# Patient Record
Sex: Female | Born: 1960 | Race: Black or African American | Hispanic: No | Marital: Single | Smoking: Former smoker
Health system: Southern US, Community
[De-identification: ages and names within clinical notes are randomized; demographics above are authoritative.]

## PROBLEM LIST (undated history)

## (undated) DIAGNOSIS — G4730 Sleep apnea, unspecified: Secondary | ICD-10-CM

## (undated) DIAGNOSIS — Z9981 Dependence on supplemental oxygen: Secondary | ICD-10-CM

## (undated) DIAGNOSIS — I872 Venous insufficiency (chronic) (peripheral): Secondary | ICD-10-CM

## (undated) DIAGNOSIS — I890 Lymphedema, not elsewhere classified: Secondary | ICD-10-CM

## (undated) DIAGNOSIS — R0600 Dyspnea, unspecified: Secondary | ICD-10-CM

## (undated) DIAGNOSIS — R6520 Severe sepsis without septic shock: Secondary | ICD-10-CM

## (undated) DIAGNOSIS — I10 Essential (primary) hypertension: Secondary | ICD-10-CM

## (undated) DIAGNOSIS — N186 End stage renal disease: Secondary | ICD-10-CM

## (undated) DIAGNOSIS — E785 Hyperlipidemia, unspecified: Secondary | ICD-10-CM

## (undated) DIAGNOSIS — E041 Nontoxic single thyroid nodule: Secondary | ICD-10-CM

## (undated) DIAGNOSIS — J9601 Acute respiratory failure with hypoxia: Secondary | ICD-10-CM

## (undated) DIAGNOSIS — M1990 Unspecified osteoarthritis, unspecified site: Secondary | ICD-10-CM

## (undated) DIAGNOSIS — J449 Chronic obstructive pulmonary disease, unspecified: Secondary | ICD-10-CM

## (undated) DIAGNOSIS — D649 Anemia, unspecified: Secondary | ICD-10-CM

## (undated) DIAGNOSIS — E0590 Thyrotoxicosis, unspecified without thyrotoxic crisis or storm: Secondary | ICD-10-CM

## (undated) DIAGNOSIS — E079 Disorder of thyroid, unspecified: Secondary | ICD-10-CM

## (undated) DIAGNOSIS — I639 Cerebral infarction, unspecified: Secondary | ICD-10-CM

## (undated) DIAGNOSIS — J45909 Unspecified asthma, uncomplicated: Secondary | ICD-10-CM

## (undated) DIAGNOSIS — J9611 Chronic respiratory failure with hypoxia: Secondary | ICD-10-CM

## (undated) DIAGNOSIS — G4733 Obstructive sleep apnea (adult) (pediatric): Secondary | ICD-10-CM

## (undated) DIAGNOSIS — E119 Type 2 diabetes mellitus without complications: Secondary | ICD-10-CM

## (undated) DIAGNOSIS — I509 Heart failure, unspecified: Secondary | ICD-10-CM

## (undated) HISTORY — DX: Essential (primary) hypertension: I10

## (undated) HISTORY — DX: Cerebral infarction, unspecified: I63.9

## (undated) HISTORY — DX: Acute respiratory failure with hypoxia: R65.20

## (undated) HISTORY — DX: Disorder of thyroid, unspecified: E07.9

## (undated) HISTORY — DX: Nontoxic single thyroid nodule: E04.1

## (undated) HISTORY — DX: Chronic obstructive pulmonary disease, unspecified: J44.9

## (undated) HISTORY — DX: Heart failure, unspecified: I50.9

## (undated) HISTORY — DX: Hyperlipidemia, unspecified: E78.5

## (undated) HISTORY — DX: Type 2 diabetes mellitus without complications: E11.9

## (undated) HISTORY — DX: Unspecified asthma, uncomplicated: J45.909

## (undated) HISTORY — PX: OTHER SURGICAL HISTORY: SHX169

---

## 1998-06-20 ENCOUNTER — Ambulatory Visit (HOSPITAL_BASED_OUTPATIENT_CLINIC_OR_DEPARTMENT_OTHER): Admission: RE | Admit: 1998-06-20 | Discharge: 1998-06-20 | Payer: Self-pay | Admitting: Orthopedic Surgery

## 1998-09-12 ENCOUNTER — Ambulatory Visit (HOSPITAL_BASED_OUTPATIENT_CLINIC_OR_DEPARTMENT_OTHER): Admission: RE | Admit: 1998-09-12 | Discharge: 1998-09-12 | Payer: Self-pay | Admitting: Orthopedic Surgery

## 2004-06-26 ENCOUNTER — Ambulatory Visit: Payer: Self-pay | Admitting: Family Medicine

## 2004-10-29 ENCOUNTER — Emergency Department: Payer: Self-pay | Admitting: Emergency Medicine

## 2006-07-14 ENCOUNTER — Ambulatory Visit: Payer: Self-pay | Admitting: *Deleted

## 2007-07-15 ENCOUNTER — Ambulatory Visit: Payer: Self-pay | Admitting: *Deleted

## 2009-05-25 ENCOUNTER — Emergency Department: Payer: Self-pay | Admitting: Emergency Medicine

## 2012-06-13 ENCOUNTER — Emergency Department: Payer: Self-pay | Admitting: Emergency Medicine

## 2012-06-21 ENCOUNTER — Ambulatory Visit: Payer: Self-pay | Admitting: Emergency Medicine

## 2012-10-16 ENCOUNTER — Emergency Department: Payer: Self-pay | Admitting: Emergency Medicine

## 2012-10-16 LAB — COMPREHENSIVE METABOLIC PANEL
Albumin: 3.2 g/dL — ABNORMAL LOW (ref 3.4–5.0)
Alkaline Phosphatase: 92 U/L (ref 50–136)
Bilirubin,Total: 0.4 mg/dL (ref 0.2–1.0)
Calcium, Total: 9.2 mg/dL (ref 8.5–10.1)
Chloride: 108 mmol/L — ABNORMAL HIGH (ref 98–107)
Co2: 26 mmol/L (ref 21–32)
EGFR (African American): >60
EGFR (Non-African Amer.): 54 — ABNORMAL LOW
Glucose: 60 mg/dL — ABNORMAL LOW (ref 65–99)
SGOT(AST): 82 U/L — ABNORMAL HIGH (ref 15–37)
SGPT (ALT): 136 U/L — ABNORMAL HIGH (ref 12–78)
Total Protein: 7.4 g/dL (ref 6.4–8.2)

## 2012-10-16 LAB — URINALYSIS, COMPLETE
Bilirubin,UR: NEGATIVE
Glucose,UR: NEGATIVE mg/dL (ref 0–75)
Leukocyte Esterase: NEGATIVE
Nitrite: NEGATIVE
Protein: 30
Specific Gravity: 1.016 (ref 1.003–1.030)
Squamous Epithelial: 6

## 2012-10-16 LAB — CBC
HCT: 43.1 % (ref 35.0–47.0)
MCHC: 33.6 g/dL (ref 32.0–36.0)
MCV: 85 fL (ref 80–100)
Platelet: 185 10*3/uL (ref 150–440)
RBC: 5.05 10*6/uL (ref 3.80–5.20)
WBC: 6.2 10*3/uL (ref 3.6–11.0)

## 2013-01-05 DIAGNOSIS — Z8614 Personal history of Methicillin resistant Staphylococcus aureus infection: Secondary | ICD-10-CM

## 2013-01-05 HISTORY — DX: Personal history of Methicillin resistant Staphylococcus aureus infection: Z86.14

## 2013-04-22 ENCOUNTER — Emergency Department: Payer: Self-pay | Admitting: Emergency Medicine

## 2013-04-22 LAB — CBC
HCT: 43 % (ref 35.0–47.0)
HGB: 13.9 g/dL (ref 12.0–16.0)
MCH: 27.5 pg (ref 26.0–34.0)
MCHC: 32.3 g/dL (ref 32.0–36.0)
MCV: 85 fL (ref 80–100)
PLATELETS: 211 10*3/uL (ref 150–440)
RBC: 5.06 10*6/uL (ref 3.80–5.20)
RDW: 13.4 % (ref 11.5–14.5)
WBC: 9.8 10*3/uL (ref 3.6–11.0)

## 2013-04-22 LAB — BASIC METABOLIC PANEL
ANION GAP: 8 (ref 7–16)
BUN: 33 mg/dL — ABNORMAL HIGH (ref 7–18)
Calcium, Total: 9.8 mg/dL (ref 8.5–10.1)
Chloride: 102 mmol/L (ref 98–107)
Co2: 28 mmol/L (ref 21–32)
Creatinine: 1.39 mg/dL — ABNORMAL HIGH (ref 0.60–1.30)
EGFR (African American): 50 — ABNORMAL LOW
EGFR (Non-African Amer.): 43 — ABNORMAL LOW
Glucose: 201 mg/dL — ABNORMAL HIGH (ref 65–99)
OSMOLALITY: 289 (ref 275–301)
Potassium: 3.2 mmol/L — ABNORMAL LOW (ref 3.5–5.1)
Sodium: 138 mmol/L (ref 136–145)

## 2013-04-23 ENCOUNTER — Emergency Department: Payer: Self-pay | Admitting: Emergency Medicine

## 2013-04-23 LAB — CBC WITH DIFFERENTIAL/PLATELET
BASOS ABS: 0.1 10*3/uL (ref 0.0–0.1)
BASOS PCT: 0.8 %
EOS PCT: 1.3 %
Eosinophil #: 0.1 10*3/uL (ref 0.0–0.7)
HCT: 43.3 % (ref 35.0–47.0)
HGB: 13.7 g/dL (ref 12.0–16.0)
LYMPHS PCT: 34.7 %
Lymphocyte #: 3 10*3/uL (ref 1.0–3.6)
MCH: 27 pg (ref 26.0–34.0)
MCHC: 31.6 g/dL — AB (ref 32.0–36.0)
MCV: 86 fL (ref 80–100)
Monocyte #: 0.7 x10 3/mm (ref 0.2–0.9)
Monocyte %: 8.1 %
NEUTROS PCT: 55.1 %
Neutrophil #: 4.7 10*3/uL (ref 1.4–6.5)
PLATELETS: 241 10*3/uL (ref 150–440)
RBC: 5.05 10*6/uL (ref 3.80–5.20)
RDW: 13.4 % (ref 11.5–14.5)
WBC: 8.6 10*3/uL (ref 3.6–11.0)

## 2013-04-23 LAB — COMPREHENSIVE METABOLIC PANEL
ALBUMIN: 2.3 g/dL — AB (ref 3.4–5.0)
ALK PHOS: 103 U/L
Anion Gap: 8 (ref 7–16)
BUN: 40 mg/dL — ABNORMAL HIGH (ref 7–18)
Bilirubin,Total: 0.3 mg/dL (ref 0.2–1.0)
CHLORIDE: 102 mmol/L (ref 98–107)
CO2: 28 mmol/L (ref 21–32)
CREATININE: 1.63 mg/dL — AB (ref 0.60–1.30)
Calcium, Total: 9.9 mg/dL (ref 8.5–10.1)
EGFR (African American): 41 — ABNORMAL LOW
GFR CALC NON AF AMER: 35 — AB
GLUCOSE: 243 mg/dL — AB (ref 65–99)
Osmolality: 293 (ref 275–301)
POTASSIUM: 3 mmol/L — AB (ref 3.5–5.1)
SGOT(AST): 24 U/L (ref 15–37)
SGPT (ALT): 16 U/L (ref 12–78)
Sodium: 138 mmol/L (ref 136–145)
Total Protein: 8 g/dL (ref 6.4–8.2)

## 2013-04-23 LAB — SEDIMENTATION RATE: Erythrocyte Sed Rate: 17 mm/hr (ref 0–30)

## 2013-06-02 ENCOUNTER — Emergency Department: Payer: Self-pay | Admitting: Emergency Medicine

## 2013-06-02 LAB — COMPREHENSIVE METABOLIC PANEL
ALT: 27 U/L (ref 12–78)
ANION GAP: 7 (ref 7–16)
Albumin: 3 g/dL — ABNORMAL LOW (ref 3.4–5.0)
Alkaline Phosphatase: 127 U/L — ABNORMAL HIGH
BUN: 14 mg/dL (ref 7–18)
Bilirubin,Total: 0.3 mg/dL (ref 0.2–1.0)
CHLORIDE: 107 mmol/L (ref 98–107)
CO2: 26 mmol/L (ref 21–32)
Calcium, Total: 9.5 mg/dL (ref 8.5–10.1)
Creatinine: 0.65 mg/dL (ref 0.60–1.30)
EGFR (African American): >60
GLUCOSE: 178 mg/dL — AB (ref 65–99)
OSMOLALITY: 284 (ref 275–301)
POTASSIUM: 3.5 mmol/L (ref 3.5–5.1)
SGOT(AST): 21 U/L (ref 15–37)
Sodium: 140 mmol/L (ref 136–145)
Total Protein: 7.7 g/dL (ref 6.4–8.2)

## 2013-06-02 LAB — CBC WITH DIFFERENTIAL/PLATELET
COMMENT - H1-COM2: NORMAL
Comment - H1-Com1: NORMAL
Eosinophil: 7 %
HCT: 41.7 % (ref 35.0–47.0)
HGB: 13.5 g/dL (ref 12.0–16.0)
Lymphocytes: 52 %
MCH: 27.7 pg (ref 26.0–34.0)
MCHC: 32.5 g/dL (ref 32.0–36.0)
MCV: 85 fL (ref 80–100)
MONOS PCT: 11 %
Platelet: 158 10*3/uL (ref 150–440)
RBC: 4.89 10*6/uL (ref 3.80–5.20)
RDW: 13.1 % (ref 11.5–14.5)
SEGMENTED NEUTROPHILS: 20 %
VARIANT LYMPHOCYTE - H1-RLYMPH: 10 %
WBC: 4.3 10*3/uL (ref 3.6–11.0)

## 2013-06-07 LAB — CULTURE, BLOOD (SINGLE)

## 2014-04-28 NOTE — Consult Note (Signed)
PATIENT NAME:  Emily [name], Emily [name] MR#:  [id number] DATE OF BIRTH:  1960-11-09    CONSULTING PHYSICIAN:  Mardene Sayer, MD  CHIEF COMPLAINT: Left hand pain.  HISTORY OF PRESENT ILLNESS: The patient is a 54 year old diabetic, who presents with left hand pain. Of note, she was cleaning fish over a week ago when she suffered a puncture wound to her left thumb. This was on the end of the thumb, on the tip. Since then, she has had increasing progressive swelling of her hand and thumb, with pain. She was previously seen at an urgent care facility, where she received a dose of Keflex. She has been worsening with regards to the left hand pain.   The patient states that she currently does not work. She is unemployed. She lives locally.   PAST MEDICAL HISTORY:  Positive for diabetes. For the remainder of the medical history, please see the electronic medical record.  MEDICATIONS:  See EMR.  ALLERGIES:  See EMR.  SOCIAL HISTORY:  The patient is here with her mother. She does not smoke.   PHYSICAL EXAMINATION: GENERAL:  Vital signs are stable.  HEENT:  Head is atraumatic and normocephalic. Normal symmetric chest rise. No increased work of breathing.  EXTREMITIES: The left upper extremity is held in the semi-flexed position. All the joints of the hand are held in the semi-flexed position. There is diffuse swelling over the dorsum of the hand as well as the volar hand extending out into the fingertips. There is a healing puncture wound over the tip of the thumb. The patient has pain with extension of any of the fingers, but worse in the thumb and small finger. The pain is so much so that she startles and jumps up off the bed when the fingers are moved.  LABS:  Lab tests demonstrate a serum glucose of 243, white count is 8.6, platelets are 241. The remainder of the labs are currently not available.  ASSESSMENT: A 54 year old female with a left hand infection, concerning for a deep palmar space infection.    DISCUSSION:  In this patient with history of a puncture wound of the thumb, and diabetes, and diffuse swelling of the hand, and pain with passive extension of all the fingers, my top concern is an extensive tenosynovitis infection in the hand and palm. I think this may have started as a flexor tenosynovitis of the thumb, which is extended proximally, extending into the palmar space and causing subsequent irritation or infection of the flexor tendons of the index, long, and ring.  I have spoken on the phone with the Attending plastic hand surgeon on call at [facility], who has refused transfer of the patient. We subsequently talked to the emergency department at [facility], who has agreed to have the patient transferred for evaluation. We will keep her n.p.o. and give her fluids in anticipation of her transfer.    ____________________________ Mardene Sayer, MD pa:mr D: 04/23/2013 14:50:26 ET T: 04/23/2013 17:23:47 ET JOB#: [id number]  cc: Mardene Sayer, MD, <Dictator> Raelyn Ensign. Stephanie Acre, MD PhD Orthopaedic Surgery ELECTRONICALLY SIGNED 04/24/2013 22:33

## 2014-05-24 ENCOUNTER — Ambulatory Visit: Payer: Self-pay

## 2014-05-31 ENCOUNTER — Ambulatory Visit: Payer: Self-pay

## 2014-06-07 ENCOUNTER — Ambulatory Visit: Payer: Self-pay

## 2014-07-12 ENCOUNTER — Other Ambulatory Visit: Payer: Self-pay

## 2014-07-19 ENCOUNTER — Ambulatory Visit: Payer: Self-pay

## 2014-07-24 ENCOUNTER — Institutional Professional Consult (permissible substitution): Payer: Self-pay

## 2014-08-16 ENCOUNTER — Other Ambulatory Visit: Payer: Self-pay

## 2014-08-21 ENCOUNTER — Institutional Professional Consult (permissible substitution): Payer: Self-pay

## 2014-08-23 ENCOUNTER — Ambulatory Visit: Payer: Self-pay

## 2014-11-13 ENCOUNTER — Other Ambulatory Visit: Payer: Self-pay

## 2014-11-20 ENCOUNTER — Ambulatory Visit: Payer: Self-pay

## 2014-11-22 ENCOUNTER — Ambulatory Visit: Payer: Self-pay

## 2014-12-20 ENCOUNTER — Other Ambulatory Visit: Payer: Self-pay

## 2014-12-27 ENCOUNTER — Ambulatory Visit: Payer: Self-pay

## 2014-12-27 DIAGNOSIS — N189 Chronic kidney disease, unspecified: Secondary | ICD-10-CM | POA: Insufficient documentation

## 2014-12-27 DIAGNOSIS — I10 Essential (primary) hypertension: Secondary | ICD-10-CM | POA: Insufficient documentation

## 2014-12-27 DIAGNOSIS — Z720 Tobacco use: Secondary | ICD-10-CM | POA: Insufficient documentation

## 2015-01-09 ENCOUNTER — Other Ambulatory Visit: Payer: Self-pay | Admitting: Urology

## 2015-01-09 DIAGNOSIS — N183 Chronic kidney disease, stage 3 unspecified: Secondary | ICD-10-CM

## 2015-01-09 DIAGNOSIS — R944 Abnormal results of kidney function studies: Secondary | ICD-10-CM

## 2015-01-17 ENCOUNTER — Ambulatory Visit
Admission: RE | Admit: 2015-01-17 | Discharge: 2015-01-17 | Disposition: A | Discharge status: Home / Self Care | Payer: Self-pay | Source: Ambulatory Visit | Attending: Urology | Admitting: Urology

## 2015-01-17 DIAGNOSIS — N183 Chronic kidney disease, stage 3 unspecified: Secondary | ICD-10-CM

## 2015-01-17 DIAGNOSIS — R944 Abnormal results of kidney function studies: Secondary | ICD-10-CM | POA: Insufficient documentation

## 2015-01-22 ENCOUNTER — Ambulatory Visit: Payer: Self-pay

## 2015-01-31 ENCOUNTER — Other Ambulatory Visit: Payer: Self-pay

## 2015-01-31 LAB — BASIC METABOLIC PANEL
BUN: 14 mg/dL (ref 4–21)
Glucose: 201 mg/dL
POTASSIUM: 4 mmol/L (ref 3.4–5.3)
SODIUM: 144 mmol/L (ref 137–147)

## 2015-01-31 LAB — HEMOGLOBIN A1C: Hemoglobin A1C: 11.4

## 2015-02-07 ENCOUNTER — Ambulatory Visit: Payer: Self-pay

## 2015-02-07 DIAGNOSIS — E1159 Type 2 diabetes mellitus with other circulatory complications: Secondary | ICD-10-CM | POA: Insufficient documentation

## 2015-02-07 DIAGNOSIS — E1165 Type 2 diabetes mellitus with hyperglycemia: Secondary | ICD-10-CM | POA: Insufficient documentation

## 2015-02-26 ENCOUNTER — Ambulatory Visit: Payer: Self-pay

## 2015-02-28 ENCOUNTER — Other Ambulatory Visit: Payer: Self-pay

## 2015-02-28 LAB — BASIC METABOLIC PANEL: CREATININE: 1.6 mg/dL — AB (ref 0.5–1.1)

## 2015-02-28 LAB — TSH: TSH: 94.49 u[IU]/mL — AB (ref 0.41–5.90)

## 2015-03-01 DIAGNOSIS — I10 Essential (primary) hypertension: Secondary | ICD-10-CM

## 2015-03-01 DIAGNOSIS — Z720 Tobacco use: Secondary | ICD-10-CM

## 2015-03-01 DIAGNOSIS — E049 Nontoxic goiter, unspecified: Secondary | ICD-10-CM

## 2015-03-01 DIAGNOSIS — N183 Chronic kidney disease, stage 3 unspecified: Secondary | ICD-10-CM

## 2015-03-01 DIAGNOSIS — E119 Type 2 diabetes mellitus without complications: Secondary | ICD-10-CM

## 2015-03-07 ENCOUNTER — Ambulatory Visit: Payer: Self-pay | Admitting: Internal Medicine

## 2015-03-07 VITALS — BP 160/103 | HR 72 | Temp 98.2°F | Ht 66.0 in | Wt 202.0 lb

## 2015-03-07 DIAGNOSIS — E049 Nontoxic goiter, unspecified: Secondary | ICD-10-CM

## 2015-03-07 DIAGNOSIS — N183 Chronic kidney disease, stage 3 unspecified: Secondary | ICD-10-CM

## 2015-03-07 DIAGNOSIS — E119 Type 2 diabetes mellitus without complications: Secondary | ICD-10-CM

## 2015-03-07 DIAGNOSIS — I10 Essential (primary) hypertension: Secondary | ICD-10-CM

## 2015-03-07 DIAGNOSIS — M545 Low back pain: Secondary | ICD-10-CM

## 2015-03-07 MED ORDER — CYCLOBENZAPRINE HCL 5 MG PO TABS: 5.0000 mg | ORAL_TABLET | Freq: Three times a day (TID) | ORAL | Status: DC | PRN

## 2015-03-07 MED ORDER — AMLODIPINE BESYLATE 5 MG PO TABS: 5.0000 mg | ORAL_TABLET | Freq: Every day | ORAL | Status: DC

## 2015-03-07 NOTE — Assessment & Plan Note (Signed)
Recent Cr 1.59. Will monitor.

## 2015-03-07 NOTE — Assessment & Plan Note (Addendum)
Lab Results  Component Value Date   HGBA1C 11.4 01/31/2015   Recent A1c elevated. Back on medication including Glipizide and Januvia and Lantus. Will continue. Recheck A1c in 05/2015.

## 2015-03-07 NOTE — Patient Instructions (Addendum)
It was nice to see you.  In the past, you had high thyroid function.   Now, your thyroid function is very very low. You need to see endocrinology as soon as possible.  Stop Methimazole.  We will set up evaluation with Endocrinology and ENT.  Start Amlodipine 5mg  daily to help control your blood pressure.   Add Flexeril to help with back pain. This medication may make you drowsy.

## 2015-03-07 NOTE — Progress Notes (Signed)
Subjective:    Patient ID: Emily [name], female    DOB: [birth year], 55 y.o.   MRN: [id number]  HPI  55YO female presents for follow up.  Previously diagnosed with hyperthyroidism. Treated with methimazole. Recently has noted increased goiter, with trouble swallowing and feels like choking when lies down. Notes that voice is hoarse.  Recent TSH was 94.   HTN - Compliant with medication. Does not check BP at home.  Back pain - Some chronic back pain, aching pain in lower back. Worse with prolonged standing.     Lab Results  Component Value Date   HGBA1C 11.4 01/31/2015     Wt Readings from Last 3 Encounters:  03/07/15 202 lb (91.627 kg)  02/26/15 200 lb (90.719 kg)   BP Readings from Last 3 Encounters:  03/07/15 160/103  02/26/15 129/84    Past Medical History  Diagnosis Date  . Diabetes mellitus without complication ([place])   . Hypertension    Family History  Problem Relation Age of Onset  . Diabetes Mother   . Hypertension Mother   . Diabetes Father   . Hypertension Father    No past surgical history on file. Social History   Social History  . Marital Status: Single    Spouse Name: N/A  . Number of Children: N/A  . Years of Education: N/A   Social History Main Topics  . Smoking status: Current Every Day Smoker -- 1.00 packs/day  . Smokeless tobacco: None  . Alcohol Use: No  . Drug Use: None  . Sexual Activity: Not Asked   Other Topics Concern  . None   Social History Narrative    Review of Systems  Constitutional: Positive for fatigue. Negative for fever, chills, appetite change and unexpected weight change.  Eyes: Negative for visual disturbance.  Respiratory: Negative for cough and shortness of breath.   Cardiovascular: Negative for chest pain and leg swelling.  Gastrointestinal: Negative for nausea, vomiting, abdominal pain, diarrhea and constipation.  Musculoskeletal: Positive for myalgias, back pain and arthralgias.  Skin: Negative  for color change and rash.  Hematological: Negative for adenopathy. Does not bruise/bleed easily.  Psychiatric/Behavioral: Positive for sleep disturbance. Negative for dysphoric mood. The patient is not nervous/anxious.        Objective:    BP 160/103 mmHg  Pulse 72  Temp(Src) 98.2 F (36.8 C)  Ht 5\' 6"  (1.676 m)  Wt 202 lb (91.627 kg)  BMI 32.62 kg/m2  SpO2 94% Physical Exam  Constitutional: She is oriented to person, place, and time. She appears well-developed and well-nourished. No distress.  HENT:  Head: Normocephalic and atraumatic.  Right Ear: External ear normal.  Left Ear: External ear normal.  Nose: Nose normal.  Mouth/Throat: Oropharynx is clear and moist. No oropharyngeal exudate.  Eyes: Conjunctivae are normal. Pupils are equal, round, and reactive to light. Right eye exhibits no discharge. Left eye exhibits no discharge. No scleral icterus.  Neck: Normal range of motion. Neck supple. No tracheal deviation present. Thyromegaly present.    Cardiovascular: Normal rate, regular rhythm, normal heart sounds and intact distal pulses.  Exam reveals no gallop and no friction rub.   No murmur heard. Pulmonary/Chest: Effort normal and breath sounds normal. No respiratory distress. She has no wheezes. She has no rales. She exhibits no tenderness.  Musculoskeletal: Normal range of motion. She exhibits no edema or tenderness.  Lymphadenopathy:    She has no cervical adenopathy.  Neurological: She is alert and oriented to person, place, and  time. No cranial nerve deficit. She exhibits normal muscle tone. Coordination normal.  Skin: Skin is warm and dry. No rash noted. She is not diaphoretic. No erythema. No pallor.  Psychiatric: She has a normal mood and affect. Her behavior is normal. Judgment and thought content normal.          Assessment & Plan:   Problem List Items Addressed This Visit      Unprioritized   Chronic kidney disease    Recent Cr 1.59. Will monitor.        Diabetes [facility])    Lab Results  Component Value Date   HGBA1C 11.4 01/31/2015   Recent A1c elevated. Back on medication including Glipizide and Januvia and Lantus. Will continue. Recheck A1c in 05/2015.      Relevant Medications   lisinopril (PRINIVIL,ZESTRIL) 40 MG tablet   atorvastatin (LIPITOR) 20 MG tablet   Essential hypertension    BP Readings from Last 3 Encounters:  03/07/15 160/103  02/26/15 129/84   BP elevated. Will add Amlodipine 5mg  daily. Recheck in 2-4 weeks.      Relevant Medications   lisinopril (PRINIVIL,ZESTRIL) 40 MG tablet   hydrochlorothiazide (HYDRODIURIL) 25 MG tablet   atorvastatin (LIPITOR) 20 MG tablet   amLODipine (NORVASC) 5 MG tablet   Goiter - Primary    Large goiter. TSH 94. Currently on Methimazole. Will stop Methimazole. Referral placed for Endocrine and ENT asap. Follow up after evaluation.      Relevant Orders   Ambulatory referral to Endocrinology   Ambulatory referral to ENT    Other Visit Diagnoses    Low back pain without sciatica, unspecified back pain laterality        Relevant Medications    cyclobenzaprine (FLEXERIL) 5 MG tablet        Return in about 2 weeks (around 03/21/2015) for Recheck of Blood Pressure.  Ronette Deter, MD Internal Medicine [facility]

## 2015-03-07 NOTE — Assessment & Plan Note (Signed)
Large goiter. TSH 94. Currently on Methimazole. Will stop Methimazole. Referral placed for Endocrine and ENT asap. Follow up after evaluation.

## 2015-03-07 NOTE — Assessment & Plan Note (Signed)
BP Readings from Last 3 Encounters:  03/07/15 160/103  02/26/15 129/84   BP elevated. Will add Amlodipine 5mg  daily. Recheck in 2-4 weeks.

## 2015-03-13 ENCOUNTER — Ambulatory Visit (INDEPENDENT_AMBULATORY_CARE_PROVIDER_SITE_OTHER): Payer: Self-pay | Admitting: Internal Medicine

## 2015-03-13 ENCOUNTER — Encounter: Payer: Self-pay | Admitting: Internal Medicine

## 2015-03-13 VITALS — BP 120/80 | HR 84 | Temp 98.2°F | Resp 12 | Wt 202.0 lb

## 2015-03-13 DIAGNOSIS — E0590 Thyrotoxicosis, unspecified without thyrotoxic crisis or storm: Secondary | ICD-10-CM

## 2015-03-13 DIAGNOSIS — E040 Nontoxic diffuse goiter: Secondary | ICD-10-CM | POA: Insufficient documentation

## 2015-03-13 DIAGNOSIS — E049 Nontoxic goiter, unspecified: Secondary | ICD-10-CM

## 2015-03-13 NOTE — Progress Notes (Signed)
Patient ID: Emily [name], female   DOB: 10-16-1960, 55 y.o.   MRN: [id number]   HPI  Emily [name] is a 55 y.o.-year-old female, referred by her PCP, Dr. Gilford Rile, for evaluation for thyrotoxicosis and goiter.  Pt was dx'ed with hyperthyroidism in ~2014 >> started MMI, lately 10 mg 2x daily. She was lost for f/u at [facility] as she could not travel over there.  An uptake and scan (06/22/2013) showed: FINDINGS: The 24 hour uptake was 24.7%. The normal reference range at 24 hours is 15 to 35%.   The thyroid scan demonstrates symmetric, mildly heterogenous distribution of radiopharmaceutical throughout the thyroid, which is increased in size.  IMPRESSION: Mildly heterogenous iodine uptake, cannot exclude toxic multinodular goiter versus possible mild Graves disease. Consider ultrasound for further correlation.  Patient was on methimazole before, however, this was stopped after the results of the latest thyroid test returned -  TSH very elevated, in the 90s.   I reviewed pt's thyroid tests: Lab Results  Component Value Date   TSH 94.49* 02/28/2015   Previously:   Also,  04/23/2013:  CRP 13.5 (0 to 1)  06/02/2013: CRP 1  Pt denies feeling nodules in neck, + hoarseness, no dysphagia/odynophagia, + chocking when lying down, + SOB with lying down.  She also describes:  + weight gain, + fatigue, + poor sleep  Pt does have a FH of thyroid ds.: aunt. No FH of thyroid cancer. No h/o radiation tx to head or neck.  No seaweed or kelp, no recent contrast studies. No steroid use. No herbal supplements. No Biotin use.  I reviewed her chart and she also has a history of DM2, HTN, HL.  ROS: Constitutional: see HPI Eyes: no blurry vision, no xerophthalmia ENT: no sore throat, see HPI Cardiovascular: no CP/+ SOB/no palpitations/leg swelling Respiratory: no cough/+ SOB Gastrointestinal: no N/V/D/C Musculoskeletal: + muscle aches/no joint aches Skin: no rashes Neurological: no  tremors/numbness/tingling/dizziness, + HA Psychiatric: no depression/anxiety  Past Medical History  Diagnosis Date  . Diabetes mellitus without complication ([place])   . Hypertension    No past surgical history on file. Social History   Social History  . Marital Status: Single    Spouse Name: N/A  . Number of Children: 1   Occupational History  . None   Social History Main Topics  . Smoking status: Current Every Day Smoker -- 1.00 packs/day  . Smokeless tobacco: Not on file  . Alcohol Use: No  . Drug Use: No   Current Outpatient Prescriptions on File Prior to Visit  Medication Sig Dispense Refill  . amLODipine (NORVASC) 5 MG tablet Take 1 tablet (5 mg total) by mouth daily. 30 tablet 3  . aspirin 81 MG tablet Take 81 mg by mouth daily.    Marland Kitchen atorvastatin (LIPITOR) 20 MG tablet Take 20 mg by mouth daily.    . carvedilol (COREG) 25 MG tablet Take 25 mg by mouth 2 (two) times daily with a meal.    . cyclobenzaprine (FLEXERIL) 5 MG tablet Take 1 tablet (5 mg total) by mouth 3 (three) times daily as needed for muscle spasms. 30 tablet 0  . glipiZIDE (GLUCOTROL) 10 MG tablet Take 10 mg by mouth 2 (two) times daily before a meal.    . hydrochlorothiazide (HYDRODIURIL) 25 MG tablet Take 25 mg by mouth daily.    . Insulin Glargine (LANTUS [place]) Inject 20 Units into the skin daily. Increase daily as instructed from 20 to as much as 40 units a day.    Marland Kitchen  lisinopril (PRINIVIL,ZESTRIL) 40 MG tablet Take 40 mg by mouth daily.    . potassium chloride (KLOR-CON) 20 MEQ packet Take 20 mEq by mouth daily.    . sitaGLIPtin (JANUVIA) 50 MG tablet Take 50 mg by mouth daily.    . varenicline (CHANTIX) 1 MG tablet Take 1 mg by mouth 2 (two) times daily. Reported on 03/07/2015     No current facility-administered medications on file prior to visit.   No Known Allergies Family History  Problem Relation Age of Onset  . Diabetes Mother   . Hypertension Mother   . Diabetes Father   . Hypertension Father      PE: BP 120/80 mmHg  Pulse 84  Temp(Src) 98.2 F (36.8 C) (Oral)  Resp 12  Wt 202 lb (91.627 kg)  SpO2 94% Body mass index is 32.62 kg/(m^2).  Wt Readings from Last 3 Encounters:  03/13/15 202 lb (91.627 kg)  03/07/15 202 lb (91.627 kg)  02/26/15 200 lb (90.719 kg)   Constitutional: overweight, in NAD Eyes: PERRLA, EOMI, no exophthalmos, no lid lag, no stare, B palpebral swelling ENT: moist mucous membranes, large L>R thyromegaly, no cervical lymphadenopathy Cardiovascular: RRR, No MRG Respiratory: CTA B Gastrointestinal: abdomen soft, NT, ND, BS+ Musculoskeletal: no deformities, strength intact in all 4 Skin: moist, warm, no rashes  Neurological: no tremor with outstretched hands, DTR normal in all 4  ASSESSMENT: 1. Thyrotoxicosis  2. Large goiter  PLAN:  1. And 2. Patient with an ~3 year h/o thyrotoxicosis, per Uptake and scan: either mild Graves or mild TMNG,  Returning to see her PCP after more than 1 year on the high dose of methimazole, of 10 mg twice a day, without medical follow-up. Her TSH has returned very high, higher than 90. Along with this, she developed a large goiter, which is compressive if she lays on her back. She has to sleep on the side. She does not have dysphagia, but does have hoarseness. She mentions that the goiter developed in the last year. -  We discussed about the fact that the probable reason for her enlarging goiter is the very high TSH (since this has trophic  effects on the thyroid), and this is a consequence of staying on the higher dose of methimazole for longer than intended.  She is now off methimazole for 5 days, and will need to check her thyroid tests in a month to see if we need to restart a low dose of methimazole versus levothyroxine. We discussed that is very likely that her goiter will decrease with the decreasing TSH, and we decided to wait until her TSH is in the normal range to see if she continues to have neck compression symptoms.  If this is the case, she will need referral to surgery for thyroidectomy. Patient agrees with plan. I suggested that she may at an anti-inflammatory (ibuprofen) to help decrease the inflammation in her thyroid until then. - I ordered the TSH, fT3 and fT4  To be checked in a month.  - we might need to do thyroid ultrasound depending on the results  - I advised her to join my chart to communicate easier - RTC in 3 months, but  In 1 month for repeat labs

## 2015-03-13 NOTE — Progress Notes (Signed)
See below. She was seen by our endocrinologist today.

## 2015-03-13 NOTE — Patient Instructions (Signed)
Please stay off Methimazole.  Come back in 1 month for labs and in 3 months for another visit.

## 2015-03-21 ENCOUNTER — Ambulatory Visit: Payer: Self-pay

## 2015-03-21 VITALS — BP 143/94 | HR 92

## 2015-03-21 DIAGNOSIS — Z0130 Encounter for examination of blood pressure without abnormal findings: Secondary | ICD-10-CM

## 2015-04-09 ENCOUNTER — Other Ambulatory Visit: Payer: Self-pay | Admitting: Internal Medicine

## 2015-04-09 ENCOUNTER — Other Ambulatory Visit: Payer: Self-pay

## 2015-04-09 DIAGNOSIS — E0590 Thyrotoxicosis, unspecified without thyrotoxic crisis or storm: Secondary | ICD-10-CM

## 2015-04-09 DIAGNOSIS — E039 Hypothyroidism, unspecified: Secondary | ICD-10-CM

## 2015-04-11 ENCOUNTER — Encounter: Payer: Self-pay | Admitting: Pharmacist

## 2015-04-14 LAB — SPECIMEN STATUS REPORT

## 2015-04-14 LAB — TSH: TSH: 8.09 u[IU]/mL — AB (ref 0.450–4.500)

## 2015-04-14 LAB — T4, FREE: FREE T4: 0.3 ng/dL — AB (ref 0.82–1.77)

## 2015-04-14 LAB — T3, FREE: T3, Free: 4.2 pg/mL (ref 2.0–4.4)

## 2015-04-23 ENCOUNTER — Ambulatory Visit: Payer: Self-pay

## 2015-05-29 ENCOUNTER — Other Ambulatory Visit: Payer: Self-pay | Admitting: Internal Medicine

## 2015-05-29 NOTE — Telephone Encounter (Signed)
Received refill request for amlodipine besylate 5mg  from [facility].

## 2015-05-30 ENCOUNTER — Encounter: Payer: Self-pay | Admitting: Pharmacist

## 2015-06-06 ENCOUNTER — Other Ambulatory Visit: Payer: Self-pay | Admitting: Urology

## 2015-06-13 ENCOUNTER — Ambulatory Visit (INDEPENDENT_AMBULATORY_CARE_PROVIDER_SITE_OTHER): Payer: Self-pay | Admitting: Internal Medicine

## 2015-06-13 ENCOUNTER — Encounter: Payer: Self-pay | Admitting: Internal Medicine

## 2015-06-13 VITALS — BP 117/83 | HR 82 | Temp 97.7°F | Ht 66.0 in | Wt 190.0 lb

## 2015-06-13 DIAGNOSIS — E040 Nontoxic diffuse goiter: Secondary | ICD-10-CM

## 2015-06-13 DIAGNOSIS — E0590 Thyrotoxicosis, unspecified without thyrotoxic crisis or storm: Secondary | ICD-10-CM

## 2015-06-13 DIAGNOSIS — E049 Nontoxic goiter, unspecified: Secondary | ICD-10-CM

## 2015-06-13 LAB — T4, FREE: Free T4: 0.57 ng/dL — ABNORMAL LOW (ref 0.60–1.60)

## 2015-06-13 LAB — TSH: TSH: 2.57 u[IU]/mL (ref 0.35–4.50)

## 2015-06-13 LAB — T3, FREE: T3 FREE: 3.6 pg/mL (ref 2.3–4.2)

## 2015-06-13 NOTE — Patient Instructions (Signed)
Please stop at the lab..  Please schedule a lab appt for 1.5 months from now.  Please come back for a follow-up appointment in 3-4 months.

## 2015-06-13 NOTE — Progress Notes (Signed)
Patient ID: Emily [name], female   DOB: 1960/03/14, 55 y.o.   MRN: [id number]   HPI  Vanilla Coombs is a 55 y.o.-year-old female, returning for f/u for thyrotoxicosis and goiter. Last visit 3 mo ago.  Reviewed and addended hx: Pt was dx'ed with hyperthyroidism in ~2014 >> started MMI, lately 10 mg 2x daily. She was lost for f/u at [facility] as she could not travel over there.  An uptake and scan (06/22/2013) showed: FINDINGS: The 24 hour uptake was 24.7%. The normal reference range at 24 hours is 15 to 35%.   The thyroid scan demonstrates symmetric, mildly heterogenous distribution of radiopharmaceutical throughout the thyroid, which is increased in size.  IMPRESSION: Mildly heterogenous iodine uptake, cannot exclude toxic multinodular goiter versus possible mild Graves disease. Consider ultrasound for further correlation.  Patient was on methimazole before, however, this was stopped after the results of the latest thyroid test returned -  TSH very elevated, in the 90s >> we stopped MMI >> TSH decreased to 8 >> we continued her off MMI.  I reviewed pt's thyroid tests: Lab Results  Component Value Date   TSH 8.090* 04/09/2015   TSH 94.49* 02/28/2015   FREET4 0.30* 04/09/2015   Previously:   Also,  04/23/2013:  CRP 13.5 (0 to 1)  06/02/2013: CRP 1  Pt denies feeling nodules in neck, + hoarseness, no dysphagia/odynophagia, + chocking when lying down, + SOB with lying down.  She also describes:  + weight gain (loss by our scale), no fatigue, no palpitations, no tremors.  Pt does have a FH of thyroid ds.: aunt. No FH of thyroid cancer. No h/o radiation tx to head or neck.  No seaweed or kelp, no recent contrast studies. No steroid use. No herbal supplements. No Biotin use.  I reviewed her chart and she also has a history of DM2, HTN, HL.  ROS: Constitutional: see HPI, + blurry vision, + nocturia Eyes: + blurry vision, no xerophthalmia ENT: no sore throat, see  HPI Cardiovascular: no CP/SOB/palpitations/leg swelling Respiratory: no cough/SOB Gastrointestinal: no N/V/D/C Musculoskeletal: no muscle aches/no joint aches Skin: no rashes Neurological: no tremors/numbness/tingling/dizziness, no HA Psychiatric: no depression/anxiety  Past Medical History  Diagnosis Date  . Diabetes mellitus without complication ([place])   . Hypertension    No past surgical history on file. Social History   Social History  . Marital Status: Single    Spouse Name: N/A  . Number of Children: 1   Occupational History  . None   Social History Main Topics  . Smoking status: Current Every Day Smoker -- 1.00 packs/day  . Smokeless tobacco: Not on file  . Alcohol Use: No  . Drug Use: No   Current Outpatient Prescriptions on File Prior to Visit  Medication Sig Dispense Refill  . amLODipine (NORVASC) 5 MG tablet Take 1 tablet (5 mg total) by mouth daily. 30 tablet 3  . aspirin 81 MG tablet Take 81 mg by mouth daily.    Marland Kitchen atorvastatin (LIPITOR) 20 MG tablet Take 20 mg by mouth daily.    . carvedilol (COREG) 25 MG tablet TAKE 1 TABLET BY MOUTH 2 TIMES A DAY 120 tablet 0  . cyclobenzaprine (FLEXERIL) 5 MG tablet Take 1 tablet (5 mg total) by mouth 3 (three) times daily as needed for muscle spasms. 30 tablet 0  . glipiZIDE (GLUCOTROL) 10 MG tablet Take 10 mg by mouth 2 (two) times daily before a meal.    . hydrochlorothiazide (HYDRODIURIL) 25 MG tablet Take 25  mg by mouth daily.    . Insulin Glargine (LANTUS [place]) Inject 20 Units into the skin daily. Increase daily as instructed from 20 to as much as 40 units a day.    . lisinopril (PRINIVIL,ZESTRIL) 40 MG tablet Take 40 mg by mouth daily.    . potassium chloride (KLOR-CON) 20 MEQ packet Take 20 mEq by mouth daily.    . sitaGLIPtin (JANUVIA) 50 MG tablet Take 50 mg by mouth daily.    . varenicline (CHANTIX) 1 MG tablet Take 1 mg by mouth 2 (two) times daily. Reported on 06/13/2015     No current facility-administered  medications on file prior to visit.   No Known Allergies Family History  Problem Relation Age of Onset  . Diabetes Mother   . Hypertension Mother   . Diabetes Father   . Hypertension Father    PE: BP 117/83 mmHg  Pulse 82  Temp(Src) 97.7 F (36.5 C) (Oral)  Ht 5\' 6"  (1.676 m)  Wt 190 lb (86.183 kg)  BMI 30.68 kg/m2  SpO2 96% Body mass index is 30.68 kg/(m^2).  Wt Readings from Last 3 Encounters:  06/13/15 190 lb (86.183 kg)  03/13/15 202 lb (91.627 kg)  03/07/15 202 lb (91.627 kg)   Constitutional: overweight, in NAD Eyes: PERRLA, EOMI, no exophthalmos, no lid lag, no stare ENT: moist mucous membranes, large L>R thyromegaly, no cervical lymphadenopathy Cardiovascular: RRR, No MRG Respiratory: CTA B Gastrointestinal: abdomen soft, NT, ND, BS+ Musculoskeletal: no deformities, strength intact in all 4 Skin: moist, warm, no rashes  Neurological: no tremor with outstretched hands, DTR normal in all 4  ASSESSMENT: 1. Thyrotoxicosis  2. Large goiter  PLAN:  1. And 2. Patient with an ~3 year h/o thyrotoxicosis, per Uptake and scan: either mild Graves or mild TMNG. She returned to see her PCP after more than 1 year on the high dose of methimazole, of 10 mg twice a day, without medical follow-up. Her TSH has returned very high, higher than 90. Along with this, she developed a large goiter, which is compressive if she lays on her back. We stopped MMI >> TSH decreased to 8, and goiter decreased in size. She does not have neck compression sxs anymore - she is still off MMI >> will check TSH, fT3 and fT4 today to see if we need to restart it - will need a repeat set of TFTs in 1.5 mo - I advised her to join my chart to communicate easier - RTC in 3-4 months  Office Visit on 06/13/2015  Component Date Value Ref Range Status  . Free T4 06/13/2015 0.57* 0.60 - 1.60 ng/dL Final  . T3, Free 06/13/2015 3.6  2.3 - 4.2 pg/mL Final  . TSH 06/13/2015 2.57  0.35 - 4.50 uIU/mL Final   TFTs  normal >> no need to start [place] for now (!), but will repeat TFTs in 1.5 mo as planned.

## 2015-06-13 NOTE — Progress Notes (Signed)
Pre visit review using our clinic tool,if applicable. No additional management support is needed unless otherwise documented below in the visit note.  

## 2015-06-17 ENCOUNTER — Telehealth: Payer: Self-pay | Admitting: Internal Medicine

## 2015-06-17 NOTE — Telephone Encounter (Signed)
Attempted to reach the pt. Pt was unavailable. Will try again at a later time.  

## 2015-06-17 NOTE — Telephone Encounter (Signed)
PT mother called and said that they were supposed to be getting a phone call explaining pt's medications but haven't heard anything.  Requests call back.

## 2015-06-18 NOTE — Telephone Encounter (Signed)
I contacted the pt. Pt stated she had not called and left the message below. Pt stated she did not have any questions at this time.

## 2015-06-25 MED ORDER — AMLODIPINE BESYLATE 5 MG PO TABS: 5.0000 mg | ORAL_TABLET | Freq: Every day | ORAL | Status: DC

## 2015-06-27 ENCOUNTER — Telehealth: Payer: Self-pay

## 2015-06-27 DIAGNOSIS — E785 Hyperlipidemia, unspecified: Secondary | ICD-10-CM

## 2015-06-27 MED ORDER — AMLODIPINE BESYLATE 5 MG PO TABS: 5.0000 mg | ORAL_TABLET | Freq: Every day | ORAL | Status: DC

## 2015-06-27 NOTE — Telephone Encounter (Signed)
Patient requesting a refill 

## 2015-07-04 ENCOUNTER — Other Ambulatory Visit: Payer: Self-pay | Admitting: Nurse Practitioner

## 2015-07-16 ENCOUNTER — Other Ambulatory Visit: Payer: Self-pay

## 2015-07-16 DIAGNOSIS — E119 Type 2 diabetes mellitus without complications: Secondary | ICD-10-CM

## 2015-07-17 LAB — HEMOGLOBIN A1C
Est. average glucose Bld gHb Est-mCnc: 306 mg/dL
Hgb A1c MFr Bld: 12.3 % — ABNORMAL HIGH (ref 4.8–5.6)

## 2015-07-23 ENCOUNTER — Ambulatory Visit: Payer: Self-pay

## 2015-07-23 ENCOUNTER — Other Ambulatory Visit: Payer: Self-pay | Admitting: Internal Medicine

## 2015-07-23 DIAGNOSIS — E0590 Thyrotoxicosis, unspecified without thyrotoxic crisis or storm: Secondary | ICD-10-CM

## 2015-07-23 DIAGNOSIS — E118 Type 2 diabetes mellitus with unspecified complications: Secondary | ICD-10-CM

## 2015-07-23 NOTE — Progress Notes (Unsigned)
Patient ID: Emily [name], female   DOB: [birth year], 55 y.o.   MRN: [id number]  Assessment:   Vitals: BP 135/80 Blood Glucose 267 T 98 HR 95   Patient was diagnosed with diabetes more than 20 years ago.   Blood Glucose: Monthly average 200  High 450 Very few below 200  Patient reports eating "lots of sweets" Berniece Salines and toast often Drinking lot of soda   Pt was told to inject 40 units glipizide my medication management when ran out of insulin.   Heartburn  2 weeks ago  After eating       Plan:    1.  Rx changes: insulin increasing titration to 5 units every 4 days. Advised patient on self titration of  insulin up to achieve blood glucose less than 200.   Advised patient to reduce soda intake and discussed smoking cessation.         Subjective:    Emily [name] is a 55 y.o. female who is seen in follow up forType 2 diabetes from 99991111  complicated by heartburn   No Known Allergies   Current outpatient prescriptions:  .  amLODipine (NORVASC) 5 MG tablet, TAKE ONE TABLET BY MOUTH EVERY DAY, Disp: 30 tablet, Rfl: 0 .  aspirin 81 MG tablet, Take 81 mg by mouth daily., Disp: , Rfl:  .  atorvastatin (LIPITOR) 20 MG tablet, Take 20 mg by mouth daily., Disp: , Rfl:  .  carvedilol (COREG) 25 MG tablet, TAKE ONE TABLET BY MOUTH 2 TIMES A DAY, Disp: 120 tablet, Rfl: 0 .  carvedilol (COREG) 25 MG tablet, TAKE 1 TABLET BY MOUTH 2 TIMES A DAY, Disp: 120 tablet, Rfl: 0 .  cyclobenzaprine (FLEXERIL) 5 MG tablet, Take 1 tablet (5 mg total) by mouth 3 (three) times daily as needed for muscle spasms., Disp: 30 tablet, Rfl: 0 .  glipiZIDE (GLUCOTROL) 10 MG tablet, Take 10 mg by mouth 2 (two) times daily before a meal., Disp: , Rfl:  .  hydrochlorothiazide (HYDRODIURIL) 25 MG tablet, Take 25 mg by mouth daily., Disp: , Rfl:  .  Insulin Glargine (LANTUS [place]), Inject 20 Units into the skin daily. Increase daily as instructed from 20 to as much as 40 units a day., Disp: ,  Rfl:  .  lisinopril (PRINIVIL,ZESTRIL) 40 MG tablet, Take 40 mg by mouth daily., Disp: , Rfl:  .  potassium chloride (KLOR-CON) 20 MEQ packet, Take 20 mEq by mouth daily., Disp: , Rfl:  .  sitaGLIPtin (JANUVIA) 50 MG tablet, Take 50 mg by mouth daily., Disp: , Rfl:  .  varenicline (CHANTIX) 1 MG tablet, Take 1 mg by mouth 2 (two) times daily. Reported on 06/13/2015, Disp: , Rfl:   Social History   Social History  . Marital Status: Single    Spouse Name: N/A  . Number of Children: N/A  . Years of Education: N/A   Social History Main Topics  . Smoking status: Current Every Day Smoker -- 1.00 packs/day  . Smokeless tobacco: Not on file  . Alcohol Use: No  . Drug Use: Not on file  . Sexual Activity: Not on file   Other Topics Concern  . Not on file   Social History Narrative    Family History  Problem Relation Age of Onset  . Diabetes Mother   . Hypertension Mother   . Diabetes Father   . Hypertension Father      Objective:     Wt Readings from Last 3 Encounters:  06/13/15  190 lb (86.183 kg)  03/13/15 202 lb (91.627 kg)  03/07/15 202 lb (91.627 kg)   CV Normal s1 and S2 no murmers rubs or gallops  RESP lungs clear to ascultation bilaterally  FOOT left sided numbness     Lab Review No components found for: A1C GLUCOSE (mg/dL)  Date Value  06/02/2013 178*  04/23/2013 243*  04/22/2013 201*   CO2 (mmol/L)  Date Value  06/02/2013 26  04/23/2013 28  04/22/2013 28   BUN (mg/dL)  Date Value  01/31/2015 14  06/02/2013 14  04/23/2013 40*  04/22/2013 33*   CREATININE (mg/dL)  Date Value  02/28/2015 1.6*  06/02/2013 0.65  04/23/2013 1.63*  04/22/2013 1.39*   No components found for: LDL,  LDLCALC,  LDLDIRECT Lab Results  Component Value Date   NA 144 01/31/2015   K 4.0 01/31/2015   CL 107 06/02/2013   CO2 26 06/02/2013   BUN 14 01/31/2015   CREATININE 1.6* 02/28/2015   GFRAA >60 06/02/2013   GFRNONAA >60 06/02/2013   GLU 201 01/31/2015   CALCIUM  9.5 06/02/2013   ALBUMIN 3.0* 06/02/2013     DIABETES MELLITUS RESULTS: Lab Results  Component Value Date   HGBA1C 12.3* 07/16/2015   HGBA1C 11.4 01/31/2015   Lab Results  Component Value Date   CREATININE 1.6* 02/28/2015   No results found for: CHOL No results found for: LDLCALC No components found for: CHOLLLDLDIRECT No components found for: MICROALB/CR Lab Results  Component Value Date   GFRAA >60 06/02/2013   GFRNONAA >60 06/02/2013

## 2015-07-25 ENCOUNTER — Other Ambulatory Visit: Payer: Self-pay

## 2015-07-30 ENCOUNTER — Other Ambulatory Visit: Payer: Self-pay | Admitting: Nurse Practitioner

## 2015-07-31 ENCOUNTER — Other Ambulatory Visit (INDEPENDENT_AMBULATORY_CARE_PROVIDER_SITE_OTHER): Payer: Self-pay

## 2015-07-31 DIAGNOSIS — E0590 Thyrotoxicosis, unspecified without thyrotoxic crisis or storm: Secondary | ICD-10-CM

## 2015-07-31 LAB — TSH: TSH: 2.35 u[IU]/mL (ref 0.35–4.50)

## 2015-07-31 LAB — T4, FREE: Free T4: 0.62 ng/dL (ref 0.60–1.60)

## 2015-07-31 LAB — T3, FREE: T3 FREE: 3.5 pg/mL (ref 2.3–4.2)

## 2015-08-14 ENCOUNTER — Telehealth: Payer: Self-pay

## 2015-08-14 NOTE — Telephone Encounter (Signed)
Called and left message advised patient that lab results were normal, no changes made. Advised patient to call back if she had any issues or wanted to know what labs were specifically. Gave call back number.

## 2015-09-13 ENCOUNTER — Ambulatory Visit: Payer: Self-pay | Admitting: Internal Medicine

## 2015-09-19 ENCOUNTER — Other Ambulatory Visit: Payer: Self-pay

## 2015-09-19 ENCOUNTER — Other Ambulatory Visit: Payer: Self-pay | Admitting: Nurse Practitioner

## 2015-09-19 DIAGNOSIS — I152 Hypertension secondary to endocrine disorders: Secondary | ICD-10-CM

## 2015-09-19 DIAGNOSIS — E108 Type 1 diabetes mellitus with unspecified complications: Secondary | ICD-10-CM

## 2015-09-19 MED ORDER — LISINOPRIL 40 MG PO TABS: 40.0000 mg | ORAL_TABLET | Freq: Every day | ORAL | 0 refills | Status: DC

## 2015-10-04 ENCOUNTER — Other Ambulatory Visit: Payer: Self-pay | Admitting: Nurse Practitioner

## 2015-10-04 ENCOUNTER — Other Ambulatory Visit: Payer: Self-pay | Admitting: Urology

## 2015-10-14 ENCOUNTER — Other Ambulatory Visit: Payer: Self-pay | Admitting: Urology

## 2015-10-14 MED ORDER — HYDROCHLOROTHIAZIDE 25 MG PO TABS: 25.0000 mg | ORAL_TABLET | Freq: Every day | ORAL | 1 refills | Status: DC

## 2015-10-22 ENCOUNTER — Ambulatory Visit: Payer: Self-pay | Admitting: Endocrinology

## 2015-10-22 VITALS — BP 112/71 | HR 82 | Temp 98.5°F | Wt 198.0 lb

## 2015-10-22 DIAGNOSIS — Z794 Long term (current) use of insulin: Principal | ICD-10-CM

## 2015-10-22 DIAGNOSIS — E108 Type 1 diabetes mellitus with unspecified complications: Secondary | ICD-10-CM

## 2015-10-22 DIAGNOSIS — E119 Type 2 diabetes mellitus without complications: Secondary | ICD-10-CM

## 2015-10-22 LAB — GLUCOSE, POCT (MANUAL RESULT ENTRY): POC Glucose: 541 mg/dl — AB (ref 70–99)

## 2015-10-22 MED ORDER — LIRAGLUTIDE 18 MG/3ML ~~LOC~~ SOPN: 1.8000 mg | PEN_INJECTOR | Freq: Every morning | SUBCUTANEOUS | 6 refills | Status: DC

## 2015-10-22 MED ORDER — INSULIN GLARGINE 100 UNIT/ML SOLOSTAR PEN: 80.0000 [IU] | PEN_INJECTOR | Freq: Every day | SUBCUTANEOUS | 6 refills | Status: DC

## 2015-10-22 NOTE — Progress Notes (Cosign Needed)
Assessment:     Emily [name] is a 55 year old woman with poorly controlled diabetes presenting to the clinic for follow up after running out of insulin due to self titration and not having a   A1C 11.4  Ran out of insulin has nothad in 2 weeks becaue of self titration -- ran out early and was not able to gt refill. Will need to switch management .   Methimazole - not taking  Chantex not taking   Lantus hasn't taken in 2 weeks. Was going all the way to 51.  Low 88  High 354   1.5 pack / day With a generally poor high fat and carbohydrate diet - discussed dietary changes and smoking cessation.   Cannot tolerate metformin.    Plan:    1.  Rx changes:  Lantus to 80.   Start victoza       Subjective:    Emily [name] is a 55 y.o. female who is 55 y.o. female who is seen in follow up forType 2 diabetes from 07/23/2015     Current Outpatient Prescriptions:  .  amLODipine (NORVASC) 5 MG tablet, TAKE ONE TABLET BY MOUTH EVERY DAY, Disp: 30 tablet, Rfl: 0 .  aspirin 81 MG tablet, Take 81 mg by mouth daily., Disp: , Rfl:  .  atorvastatin (LIPITOR) 20 MG tablet, Take 20 mg by mouth daily., Disp: , Rfl:  .  carvedilol (COREG) 25 MG tablet, TAKE 1 TABLET BY MOUTH 2 TIMES A DAY, Disp: 120 tablet, Rfl: 0 .  carvedilol (COREG) 25 MG tablet, TAKE ONE TABLET BY MOUTH 2 TIMES A DAY, Disp: 180 tablet, Rfl: 0 .  cyclobenzaprine (FLEXERIL) 5 MG tablet, Take 1 tablet (5 mg total) by mouth 3 (three) times daily as needed for muscle spasms., Disp: 30 tablet, Rfl: 0 .  glipiZIDE (GLUCOTROL) 10 MG tablet, Take 10 mg by mouth 2 (two) times daily before a meal., Disp: , Rfl:  .  hydrochlorothiazide (HYDRODIURIL) 25 MG tablet, Take 1 tablet (25 mg total) by mouth daily., Disp: 30 tablet, Rfl: 1 .  Insulin Glargine (LANTUS [place]), Inject 20 Units into the skin daily. Increase daily as instructed from 20 to as much as 40 units a day., Disp: , Rfl:  .  LANTUS SOLOSTAR 100 UNIT/ML Solostar Pen, INJECT 40 UNITS UNDER THE  SKIN DAILY. MAX IS 40 UNITS DAILY., Disp: 15 mL, Rfl: 6 .  lisinopril (PRINIVIL,ZESTRIL) 40 MG tablet, Take 1 tablet (40 mg total) by mouth daily., Disp: 90 tablet, Rfl: 0 .  potassium chloride (KLOR-CON) 20 MEQ packet, Take 20 mEq by mouth daily., Disp: , Rfl:  .  sitaGLIPtin (JANUVIA) 50 MG tablet, Take 50 mg by mouth daily., Disp: , Rfl:  .  varenicline (CHANTIX) 1 MG tablet, Take 1 mg by mouth 2 (two) times daily. Reported on 07/23/2015, Disp: , Rfl:   Social History   Social History  . Marital status: Single    Spouse name: N/A  . Number of children: N/A  . Years of education: N/A   Social History Main Topics  . Smoking status: Current Every Day Smoker    Packs/day: 1.50    Types: Cigarettes  . Smokeless tobacco: None  . Alcohol use No  . Drug use: Unknown  . Sexual activity: Not Asked   Other Topics Concern  . None   Social History Narrative  . None    Family History  Problem Relation Age of Onset  . Diabetes Mother   . Hypertension Mother   .  Diabetes Father   . Hypertension Father     Review of Systems A 12 point review of systems was negative except for pertinent items noted in the HPI.   Objective:     Wt Readings from Last 3 Encounters:  10/22/15 198 lb (89.8 kg)  06/13/15 190 lb (86.2 kg)  03/13/15 202 lb (91.6 kg)   BP 112/71   Pulse 82   Temp 98.5 F (36.9 C)   Wt 198 lb (89.8 kg)   BMI 31.96 kg/m   General appearance:  alert and appears stated age, in no distress      Eyes:  conjunctivae/corneas clear. EOM's intact. Sclera anicteric. Negative lid lag or proptosis     Neck: no adenopathy, supple, symmetrical, trachea midline  Thyroid:  Mobile, normal size, no palpable nodule  Lung: clear to auscultation bilaterally  Heart:  regular rate and rhythm, S1, S2 normal, no murmur, click, rub or gallop  Abdomen:  bowel sounds normal; negative bruits  Extremities: extremities normal, atraumatic, no cyanosis or edema     Pulses: DP & PT 2+ and  symmetric.  Neuro: normal without focal findings, mental status, speech normal, alert and oriented x3, reflexes normal and symmetric and gait normal.   Feet: negative lesions or ulcers, 10 gram monofilament intact bilateral plantar surface     Lab Review No components found for: A1C Glucose (mg/dL)  Date Value  06/02/2013 178 (H)  04/23/2013 243 (H)  04/22/2013 201 (H)   Co2 (mmol/L)  Date Value  06/02/2013 26  04/23/2013 28  04/22/2013 28   BUN (mg/dL)  Date Value  01/31/2015 14  06/02/2013 14  04/23/2013 40 (H)  04/22/2013 33 (H)   Creatinine (mg/dL)  Date Value  02/28/2015 1.6 (A)  06/02/2013 0.65  04/23/2013 1.63 (H)  04/22/2013 1.39 (H)   No components found for: LDL,  LDLCALC,  LDLDIRECT Lab Results  Component Value Date   NA 144 01/31/2015   K 4.0 01/31/2015   CL 107 06/02/2013   CO2 26 06/02/2013   BUN 14 01/31/2015   CREATININE 1.6 (A) 02/28/2015   GFRAA >60 06/02/2013   GFRNONAA >60 06/02/2013   GLU 201 01/31/2015   CALCIUM 9.5 06/02/2013   ALBUMIN 3.0 (L) 06/02/2013     DIABETES MELLITUS RESULTS: Lab Results  Component Value Date   HGBA1C 12.3 (H) 07/16/2015   HGBA1C 11.4 01/31/2015   Lab Results  Component Value Date   CREATININE 1.6 (A) 02/28/2015   No results found for: CHOL No results found for: LDLCALC No components found for: CHOLLLDLDIRECT No components found for: MICROALB/CR Lab Results  Component Value Date   GFRAA >60 06/02/2013   GFRNONAA >60 06/02/2013

## 2015-10-22 NOTE — Progress Notes (Deleted)
Create Note    Emily [name], Medical StudentMedical Student 6:00 PM     Expand All Collapse All     Assessment:   1. Type 2 diabetes mellitus without complication, with long-term current use of insulin (HCC) *** - POCT Glucose (CBG)   Emily [name] is a 55 year old woman with poorly controlled diabetes presenting to the clinic for follow up after running out of insulin due to self titration. Has not had insulin in 2-3 weeks.   A1C 11.4  .   Methimazole - not taking  Chantex not taking    Low 88 (isolated)  High 354 Average 270  Breakfast sausage biscuit  Steak and cheese dinner  1.5 pack / day    Cannot tolerate metformin.       Plan:    1.  Rx changes:   Lantus to 80.  Start victoza     Subjective:    Emily [name] is a 55 y.o. female who is seen in follow up forType 2 diabetes from 07/23/2015      Current Outpatient Prescriptions:    amLODipine (NORVASC) 5 MG tablet, TAKE ONE TABLET BY MOUTH EVERY DAY, Disp: 30 tablet, Rfl: 0   aspirin 81 MG tablet, Take 81 mg by mouth daily., Disp: , Rfl:    atorvastatin (LIPITOR) 20 MG tablet, Take 20 mg by mouth daily., Disp: , Rfl:    carvedilol (COREG) 25 MG tablet, TAKE 1 TABLET BY MOUTH 2 TIMES A DAY, Disp: 120 tablet, Rfl: 0   carvedilol (COREG) 25 MG tablet, TAKE ONE TABLET BY MOUTH 2 TIMES A DAY, Disp: 180 tablet, Rfl: 0   cyclobenzaprine (FLEXERIL) 5 MG tablet, Take 1 tablet (5 mg total) by mouth 3 (three) times daily as needed for muscle spasms., Disp: 30 tablet, Rfl: 0   glipiZIDE (GLUCOTROL) 10 MG tablet, Take 10 mg by mouth 2 (two) times daily before a meal., Disp: , Rfl:    hydrochlorothiazide (HYDRODIURIL) 25 MG tablet, Take 1 tablet (25 mg total) by mouth daily., Disp: 30 tablet, Rfl: 1   Insulin Glargine (LANTUS [place]), Inject 20 Units into the skin daily. Increase daily as instructed from 20 to as much as 40 units a day., Disp: , Rfl:    LANTUS SOLOSTAR  100 UNIT/ML Solostar Pen, INJECT 40 UNITS UNDER THE SKIN DAILY. MAX IS 40 UNITS DAILY., Disp: 15 mL, Rfl: 6   lisinopril (PRINIVIL,ZESTRIL) 40 MG tablet, Take 1 tablet (40 mg total) by mouth daily., Disp: 90 tablet, Rfl: 0   potassium chloride (KLOR-CON) 20 MEQ packet, Take 20 mEq by mouth daily., Disp: , Rfl:    sitaGLIPtin (JANUVIA) 50 MG tablet, Take 50 mg by mouth daily., Disp: , Rfl:    varenicline (CHANTIX) 1 MG tablet, Take 1 mg by mouth 2 (two) times daily. Reported on 07/23/2015, Disp: , Rfl:   Social History        Social History   Marital status: Single    Spouse name: N/A   Number of children: N/A   Years of education: N/A        Social History Main Topics   Smoking status: Current Every Day Smoker    Packs/day: 1.50    Types: Cigarettes   Smokeless tobacco: None   Alcohol use No   Drug use: Unknown   Sexual activity: Not Asked       Other Topics Concern   None      Social History  Narrative   None         Family History  Problem Relation Age of Onset   Diabetes Mother    Hypertension Mother    Diabetes Father    Hypertension Father      Objective:        Wt Readings from Last 3 Encounters:  10/22/15 198 lb (89.8 kg)  06/13/15 190 lb (86.2 kg)  03/13/15 202 lb (91.6 kg)   BP 112/71    Pulse 82    Temp 98.5 F (36.9 C)    Wt 198 lb (89.8 kg)    BMI 31.96 kg/m   General appearance:  alert and appears stated age, in no distress      Eyes:  conjunctivae/corneas clear. EOM's intact. Sclera anicteric. Negative lid lag or proptosis     Neck: no adenopathy, supple, symmetrical, trachea midline  Thyroid:  Mobile, normal size, no palpable nodule  Lung: clear to auscultation bilaterally  Heart:  regular rate and rhythm, S1, S2 normal, no murmur, click, rub or gallop  Abdomen:  bowel sounds normal; negative bruits  Extremities: extremities normal, atraumatic, no cyanosis or edema     Pulses: DP & PT 2+ and  symmetric.  Neuro: normal without focal findings, mental status, speech normal, alert and oriented x3, reflexes normal and symmetric and gait normal.   Feet: negative lesions or ulcers, 10 gram monofilament intact bilateral plantar surface     Lab Review Recent Labs  No components found for: A1C   Last Labs     Glucose (mg/dL)  Date Value  06/02/2013 178 (H)  04/23/2013 243 (H)  04/22/2013 201 (H)      Co2 (mmol/L)  Date Value  06/02/2013 26  04/23/2013 28  04/22/2013 28      BUN (mg/dL)  Date Value  01/31/2015 14  06/02/2013 14  04/23/2013 40 (H)  04/22/2013 33 (H)      Creatinine (mg/dL)  Date Value  02/28/2015 1.6 (A)  06/02/2013 0.65  04/23/2013 1.63 (H)  04/22/2013 1.39 (H)     Recent Labs  No components found for: LDL,  LDLCALC,  LDLDIRECT   Recent Labs       Lab Results  Component Value Date   NA 144 01/31/2015   K 4.0 01/31/2015   CL 107 06/02/2013   CO2 26 06/02/2013   BUN 14 01/31/2015   CREATININE 1.6 (A) 02/28/2015   GFRAA >60 06/02/2013   GFRNONAA >60 06/02/2013   GLU 201 01/31/2015   CALCIUM 9.5 06/02/2013   ALBUMIN 3.0 (L) 06/02/2013       DIABETES MELLITUS RESULTS: Recent Labs       Lab Results  Component Value Date   HGBA1C 12.3 (H) 07/16/2015   HGBA1C 11.4 01/31/2015     Recent Labs       Lab Results  Component Value Date   CREATININE 1.6 (A) 02/28/2015     Recent Labs  No results found for: CHOL   Recent Labs  No results found for: LDLCALC   Recent Labs  No components found for: CHOLLLDLDIRECT   Recent Labs  No components found for: MICROALB/CR   Recent Labs       Lab Results  Component Value Date   GFRAA >60 06/02/2013   GFRNONAA >60 06/02/2013

## 2015-10-23 ENCOUNTER — Ambulatory Visit: Payer: Self-pay

## 2015-11-05 ENCOUNTER — Ambulatory Visit: Payer: Self-pay | Admitting: Adult Health Nurse Practitioner

## 2015-11-05 VITALS — BP 118/82 | HR 84 | Wt 198.0 lb

## 2015-11-05 DIAGNOSIS — E1129 Type 2 diabetes mellitus with other diabetic kidney complication: Secondary | ICD-10-CM

## 2015-11-05 DIAGNOSIS — M62838 Other muscle spasm: Secondary | ICD-10-CM | POA: Insufficient documentation

## 2015-11-05 DIAGNOSIS — M62830 Muscle spasm of back: Secondary | ICD-10-CM | POA: Insufficient documentation

## 2015-11-05 DIAGNOSIS — Z794 Long term (current) use of insulin: Principal | ICD-10-CM

## 2015-11-05 LAB — GLUCOSE, POCT (MANUAL RESULT ENTRY): POC Glucose: 120 mg/dl — AB (ref 70–99)

## 2015-11-05 MED ORDER — AMLODIPINE BESYLATE 5 MG PO TABS: 5.0000 mg | ORAL_TABLET | Freq: Every day | ORAL | 3 refills | Status: DC

## 2015-11-05 MED ORDER — CYCLOBENZAPRINE HCL 5 MG PO TABS: 5.0000 mg | ORAL_TABLET | Freq: Every day | ORAL | 0 refills | Status: DC | PRN

## 2015-11-05 NOTE — Progress Notes (Signed)
Patient: Emily [name] Female    DOB: 1960-10-03   55 y.o.   MRN: [id number] Visit Date: 11/05/2015  Today's Provider: [facility]   No chief complaint on file.  Subjective:    HPI   Pt states that she is having muscle spasms in her thigh and lower back and she would like a refill on her flexeril.  Pt states the spasms are worse when she stands up a lot- rated 10/10. Pt states it does not hurt right now.  States that the medications helps.  Pt states that she takes medication once a week as needed-she takes 0.5 pill.      No Known Allergies Previous Medications   AMLODIPINE (NORVASC) 5 MG TABLET    TAKE ONE TABLET BY MOUTH EVERY DAY   ASPIRIN 81 MG TABLET    Take 81 mg by mouth daily.   ATORVASTATIN (LIPITOR) 20 MG TABLET    Take 20 mg by mouth daily.   CARVEDILOL (COREG) 25 MG TABLET    TAKE ONE TABLET BY MOUTH 2 TIMES A DAY   CYCLOBENZAPRINE (FLEXERIL) 5 MG TABLET    Take 1 tablet (5 mg total) by mouth 3 (three) times daily as needed for muscle spasms.   GLIPIZIDE (GLUCOTROL) 10 MG TABLET    Take 10 mg by mouth 2 (two) times daily before a meal.   HYDROCHLOROTHIAZIDE (HYDRODIURIL) 25 MG TABLET    Take 1 tablet (25 mg total) by mouth daily.   INSULIN GLARGINE (LANTUS SOLOSTAR) 100 UNIT/ML SOLOSTAR PEN    Inject 80 Units into the skin daily at 10 pm.   LIRAGLUTIDE 18 MG/3ML SOPN    Inject 0.3 mLs (1.8 mg total) into the skin every morning.   LISINOPRIL (PRINIVIL,ZESTRIL) 40 MG TABLET    Take 1 tablet (40 mg total) by mouth daily.   METHIMAZOLE (TAPAZOLE) 10 MG TABLET    Take 10 mg by mouth 2 (two) times daily.   POTASSIUM CHLORIDE (KLOR-CON) 20 MEQ PACKET    Take 20 mEq by mouth daily.   SITAGLIPTIN (JANUVIA) 50 MG TABLET    Take 50 mg by mouth daily.   VARENICLINE (CHANTIX) 1 MG TABLET    Take 1 mg by mouth 2 (two) times daily. Reported on 07/23/2015    Review of Systems  All other systems reviewed and are negative.   Social History  Substance Use Topics  .  Smoking status: Current Every Day Smoker    Packs/day: 1.50    Types: Cigarettes  . Smokeless tobacco: Not on file  . Alcohol use No   Objective:   BP 118/82   Pulse 84   Wt 198 lb (89.8 kg)   SpO2 90%   BMI 31.96 kg/m   Physical Exam  Constitutional: She is oriented to person, place, and time. She appears well-developed and well-nourished.  HENT:  Head: Normocephalic and atraumatic.  Eyes: Pupils are equal, round, and reactive to light.  Cardiovascular: Normal rate, regular rhythm and normal heart sounds.   Pulmonary/Chest: Effort normal and breath sounds normal.  Abdominal: Soft. Bowel sounds are normal.  Musculoskeletal: Normal range of motion.  Full back ROM. No tenderness to spinal palpation.   Neurological: She is alert and oriented to person, place, and time.  Skin: Skin is warm and dry.        Assessment & Plan:         Will refill flexeril.  Recommend icy hot or bengay on the affected area.  Muscle strengthening  exercises.   Routine labs ordered prior to next visit in 3 months.    [facility]

## 2015-11-07 ENCOUNTER — Encounter: Payer: Self-pay | Admitting: Internal Medicine

## 2015-11-07 ENCOUNTER — Ambulatory Visit (INDEPENDENT_AMBULATORY_CARE_PROVIDER_SITE_OTHER): Payer: Self-pay | Admitting: Internal Medicine

## 2015-11-07 VITALS — BP 128/88 | HR 64 | Wt 199.0 lb

## 2015-11-07 DIAGNOSIS — E049 Nontoxic goiter, unspecified: Secondary | ICD-10-CM

## 2015-11-07 DIAGNOSIS — E0590 Thyrotoxicosis, unspecified without thyrotoxic crisis or storm: Secondary | ICD-10-CM

## 2015-11-07 DIAGNOSIS — E040 Nontoxic diffuse goiter: Secondary | ICD-10-CM

## 2015-11-07 LAB — T3, FREE: T3, Free: 4.4 pg/mL — ABNORMAL HIGH (ref 2.3–4.2)

## 2015-11-07 LAB — TSH: TSH: 7.49 u[IU]/mL — ABNORMAL HIGH (ref 0.35–4.50)

## 2015-11-07 LAB — T4, FREE: Free T4: 0.32 ng/dL — ABNORMAL LOW (ref 0.60–1.60)

## 2015-11-07 NOTE — Progress Notes (Signed)
Patient ID: Remie Mathison, female   DOB: [birth year], 55 y.o.   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 Last visit 5 mo ago.  Reviewed and addended hx: Pt was dx'ed with hyperthyroidism in ~2014 >> started MMI, lately 10 mg 2x daily. She was lost for f/u at [facility] as she could not travel over there.  An uptake and scan (06/22/2013) showed: FINDINGS: The 24 hour uptake was 24.7%. The normal reference range at 24 hours is 15 to 35%.   The thyroid scan demonstrates symmetric, mildly heterogenous distribution of radiopharmaceutical throughout the thyroid, which is increased in size.  IMPRESSION: Mildly heterogenous iodine uptake, cannot exclude toxic multinodular goiter versus possible mild Graves disease. Consider ultrasound for further correlation.  Patient was on methimazole before, however, this was stopped after the results of the latest thyroid test returned -  TSH very elevated, in the 90s >> we stopped MMI >> TSH decreased to 8, then normalized >> we continued her off MMI.  I reviewed pt's thyroid tests: Lab Results  Component Value Date   TSH 2.35 07/31/2015   TSH 2.57 06/13/2015   TSH 8.090 (H) 04/09/2015   TSH 94.49 (A) 02/28/2015   FREET4 0.62 07/31/2015   FREET4 0.57 (L) 06/13/2015   FREET4 0.30 (L) 04/09/2015   Previously:   Pt denies feeling nodules in neck, + hoarseness, no dysphagia/odynophagia, + chocking when lying down, + SOB with lying down.  Pt does have a FH of thyroid ds.: aunt. No FH of thyroid cancer. No h/o radiation tx to head or neck.  No seaweed or kelp, no recent contrast studies. No steroid use. No herbal supplements. No Biotin use.  I reviewed her chart and she also has a history of DM2, HTN, HL.  Her last HbA1c was very high:  Lab Results  Component Value Date   HGBA1C 12.3 (H) 07/16/2015   HGBA1C 11.4 01/31/2015   This is managed by PCP. She tells me she will  add a GLP1 R agonist soon. She is on insulin.  ROS: Constitutional: see HPI, + blurry vision, + nocturia Eyes: + blurry vision, no xerophthalmia ENT: no sore throat, see HPI Cardiovascular: no CP/SOB/palpitations/leg swelling Respiratory: no cough/SOB Gastrointestinal: no N/V/D/C Musculoskeletal: no muscle aches/no joint aches Skin: no rashes Neurological: no tremors/numbness/tingling/dizziness, no HA  I reviewed pt's medications, allergies, PMH, social hx, family hx, and changes were documented in the history of present illness. Otherwise, unchanged from my initial visit note.  Past Medical History:  Diagnosis Date  . Diabetes mellitus without complication ([place])   . Hypertension    No past surgical history on file. Social History   Social History  . Marital Status: Single    Spouse Name: N/A  . Number of Children: 1   Occupational History  . None   Social History Main Topics  . Smoking status: Current Every Day Smoker -- 1.00 packs/day  . Smokeless tobacco: Not on file  . Alcohol Use: No  . Drug Use: No   Current Outpatient Prescriptions on File Prior to Visit  Medication Sig Dispense Refill  . amLODipine (NORVASC) 5 MG tablet Take 1 tablet (5 mg total) by mouth daily. 30 tablet 3  . aspirin 81 MG tablet Take 81 mg by mouth daily.    Marland Kitchen atorvastatin (LIPITOR) 20 MG tablet Take 20 mg by mouth daily.    . carvedilol (COREG) 25 MG tablet TAKE ONE TABLET  BY MOUTH 2 TIMES A DAY 180 tablet 0  . cyclobenzaprine (FLEXERIL) 5 MG tablet Take 1 tablet (5 mg total) by mouth daily as needed for muscle spasms. 30 tablet 0  . glipiZIDE (GLUCOTROL) 10 MG tablet Take 10 mg by mouth 2 (two) times daily before a meal.    . hydrochlorothiazide (HYDRODIURIL) 25 MG tablet Take 1 tablet (25 mg total) by mouth daily. 30 tablet 1  . Insulin Glargine (LANTUS SOLOSTAR) 100 UNIT/ML Solostar Pen Inject 80 Units into the skin daily at 10 pm. 30 mL 6  . liraglutide 18 MG/3ML SOPN Inject 0.3 mLs (1.8  mg total) into the skin every morning. 15 mL 6  . lisinopril (PRINIVIL,ZESTRIL) 40 MG tablet Take 1 tablet (40 mg total) by mouth daily. 90 tablet 0  . methimazole (TAPAZOLE) 10 MG tablet Take 10 mg by mouth 2 (two) times daily.    . potassium chloride (KLOR-CON) 20 MEQ packet Take 20 mEq by mouth daily.    . sitaGLIPtin (JANUVIA) 50 MG tablet Take 50 mg by mouth daily.    . varenicline (CHANTIX) 1 MG tablet Take 1 mg by mouth 2 (two) times daily. Reported on 07/23/2015     No current facility-administered medications on file prior to visit.    No Known Allergies Family History  Problem Relation Age of Onset  . Diabetes Mother   . Hypertension Mother   . Diabetes Father   . Hypertension Father    PE: BP 128/88 (BP Location: Left Arm, Patient Position: Sitting)   Pulse 64   Wt 199 lb (90.3 kg)   SpO2 94%   BMI 32.12 kg/m  Body mass index is 32.12 kg/m.  Wt Readings from Last 3 Encounters:  11/07/15 199 lb (90.3 kg)  11/05/15 198 lb (89.8 kg)  10/22/15 198 lb (89.8 kg)   Constitutional: overweight, in NAD Eyes: PERRLA, EOMI, no exophthalmos, no lid lag, no stare ENT: moist mucous membranes, large L>R thyromegaly, no cervical lymphadenopathy Cardiovascular: RRR, No MRG Respiratory: CTA B Gastrointestinal: abdomen soft, NT, ND, BS+ Musculoskeletal: no deformities, strength intact in all 4 Skin: moist, warm, no rashes  Neurological: no tremor with outstretched hands, DTR normal in all 4  ASSESSMENT: 1. Thyrotoxicosis  2. Large goiter  3. DM2  PLAN:  1. And 2. Patient with an ~3 year h/o thyrotoxicosis, per Uptake and scan: either mild Graves or mild TMNG. She returned to see her PCP after more than 1 year on the high dose of methimazole, of 10 mg twice a day, without medical follow-up. Her TSH has returned very high, higher than 90. Along with this, she developed a large goiter, which is compressive if she lays on her back. We stopped MMI >> TSH decreased to normal and  goiter decreased in size. She still has some compressive sxs now, several mo after stopping MMI >> will check a thyroids U/S - will check TSH, fT3 and fT4 today to see if we need to restart it - I advised her to join my chart to communicate easier - RTC in 6 months  3. DM2 - this is managed by PCP - we reviewed together her last HbA1c which is very high. She mentions she will see PCP for med adjustment.   Component     Latest Ref Rng & Units 11/07/2015  TSH     0.35 - 4.50 uIU/mL 7.49 (H)  T4,Free(Direct)     0.60 - 1.60 ng/dL 0.32 (L)  Triiodothyronine,Free,Serum  2.3 - 4.2 pg/mL 4.4 (H)   Unusual picture, of high TSH, low free T4 but high free T3... I would like to repeat her testing 4-5 weeks, but would not intervene at this point.  Thyroid U/S pending. I will addend the results when they become available.  Philemon Kingdom, MD PhD [facility] Endocrinology

## 2015-11-07 NOTE — Patient Instructions (Signed)
Please stop at the lab.  Please come back for a follow-up appointment in 6 months.  

## 2015-11-08 ENCOUNTER — Telehealth: Payer: Self-pay

## 2015-11-08 NOTE — Telephone Encounter (Signed)
Called patient and advised of lab results. Patient will call back to make lab appointment in 4-5 weeks. No other questions or concerns at this time.

## 2015-11-15 ENCOUNTER — Other Ambulatory Visit: Payer: Self-pay

## 2015-12-06 ENCOUNTER — Other Ambulatory Visit: Payer: Self-pay

## 2015-12-10 ENCOUNTER — Other Ambulatory Visit (INDEPENDENT_AMBULATORY_CARE_PROVIDER_SITE_OTHER): Payer: Self-pay

## 2015-12-10 DIAGNOSIS — E0590 Thyrotoxicosis, unspecified without thyrotoxic crisis or storm: Secondary | ICD-10-CM

## 2015-12-10 LAB — T4, FREE: Free T4: 0.47 ng/dL — ABNORMAL LOW (ref 0.60–1.60)

## 2015-12-10 LAB — T3, FREE: T3 FREE: 3.6 pg/mL (ref 2.3–4.2)

## 2015-12-10 LAB — TSH: TSH: 3.2 u[IU]/mL (ref 0.35–4.50)

## 2015-12-11 ENCOUNTER — Other Ambulatory Visit: Payer: Self-pay | Admitting: Internal Medicine

## 2015-12-11 ENCOUNTER — Other Ambulatory Visit: Payer: Self-pay | Admitting: Urology

## 2015-12-26 ENCOUNTER — Other Ambulatory Visit: Payer: Self-pay | Admitting: Urology

## 2015-12-26 DIAGNOSIS — I152 Hypertension secondary to endocrine disorders: Secondary | ICD-10-CM

## 2016-01-14 ENCOUNTER — Emergency Department: Payer: Medicaid Other

## 2016-01-14 ENCOUNTER — Inpatient Hospital Stay
Admission: EM | Admit: 2016-01-14 | Discharge: 2016-01-16 | DRG: 190 | Disposition: A | Discharge status: Home / Self Care | Payer: Medicaid Other | Attending: Internal Medicine | Admitting: Internal Medicine

## 2016-01-14 DIAGNOSIS — J440 Chronic obstructive pulmonary disease with acute lower respiratory infection: Secondary | ICD-10-CM | POA: Diagnosis not present

## 2016-01-14 DIAGNOSIS — E039 Hypothyroidism, unspecified: Secondary | ICD-10-CM | POA: Diagnosis present

## 2016-01-14 DIAGNOSIS — J9811 Atelectasis: Secondary | ICD-10-CM | POA: Diagnosis present

## 2016-01-14 DIAGNOSIS — Z9114 Patient's other noncompliance with medication regimen: Secondary | ICD-10-CM

## 2016-01-14 DIAGNOSIS — J40 Bronchitis, not specified as acute or chronic: Secondary | ICD-10-CM

## 2016-01-14 DIAGNOSIS — T380X5A Adverse effect of glucocorticoids and synthetic analogues, initial encounter: Secondary | ICD-10-CM | POA: Diagnosis present

## 2016-01-14 DIAGNOSIS — N189 Chronic kidney disease, unspecified: Secondary | ICD-10-CM | POA: Diagnosis present

## 2016-01-14 DIAGNOSIS — Z7982 Long term (current) use of aspirin: Secondary | ICD-10-CM

## 2016-01-14 DIAGNOSIS — J441 Chronic obstructive pulmonary disease with (acute) exacerbation: Secondary | ICD-10-CM | POA: Diagnosis present

## 2016-01-14 DIAGNOSIS — F17210 Nicotine dependence, cigarettes, uncomplicated: Secondary | ICD-10-CM | POA: Diagnosis present

## 2016-01-14 DIAGNOSIS — Z79899 Other long term (current) drug therapy: Secondary | ICD-10-CM

## 2016-01-14 DIAGNOSIS — Z833 Family history of diabetes mellitus: Secondary | ICD-10-CM | POA: Diagnosis not present

## 2016-01-14 DIAGNOSIS — E0590 Thyrotoxicosis, unspecified without thyrotoxic crisis or storm: Secondary | ICD-10-CM | POA: Diagnosis present

## 2016-01-14 DIAGNOSIS — Z8249 Family history of ischemic heart disease and other diseases of the circulatory system: Secondary | ICD-10-CM | POA: Diagnosis not present

## 2016-01-14 DIAGNOSIS — E1165 Type 2 diabetes mellitus with hyperglycemia: Secondary | ICD-10-CM | POA: Diagnosis present

## 2016-01-14 DIAGNOSIS — R0902 Hypoxemia: Secondary | ICD-10-CM

## 2016-01-14 DIAGNOSIS — J9601 Acute respiratory failure with hypoxia: Secondary | ICD-10-CM | POA: Diagnosis present

## 2016-01-14 DIAGNOSIS — E785 Hyperlipidemia, unspecified: Secondary | ICD-10-CM | POA: Diagnosis present

## 2016-01-14 DIAGNOSIS — R05 Cough: Secondary | ICD-10-CM | POA: Diagnosis not present

## 2016-01-14 DIAGNOSIS — Z794 Long term (current) use of insulin: Secondary | ICD-10-CM

## 2016-01-14 DIAGNOSIS — N179 Acute kidney failure, unspecified: Secondary | ICD-10-CM | POA: Diagnosis present

## 2016-01-14 DIAGNOSIS — I129 Hypertensive chronic kidney disease with stage 1 through stage 4 chronic kidney disease, or unspecified chronic kidney disease: Secondary | ICD-10-CM | POA: Diagnosis present

## 2016-01-14 DIAGNOSIS — E1122 Type 2 diabetes mellitus with diabetic chronic kidney disease: Secondary | ICD-10-CM | POA: Diagnosis present

## 2016-01-14 DIAGNOSIS — J209 Acute bronchitis, unspecified: Secondary | ICD-10-CM | POA: Diagnosis present

## 2016-01-14 DIAGNOSIS — R2689 Other abnormalities of gait and mobility: Secondary | ICD-10-CM

## 2016-01-14 LAB — RAPID INFLUENZA A&B ANTIGENS: Influenza A (ARMC): NEGATIVE

## 2016-01-14 LAB — COMPREHENSIVE METABOLIC PANEL
ALT: 16 U/L (ref 14–54)
AST: 18 U/L (ref 15–41)
Albumin: 3.7 g/dL (ref 3.5–5.0)
Alkaline Phosphatase: 71 U/L (ref 38–126)
Anion gap: 7 (ref 5–15)
BILIRUBIN TOTAL: 0.5 mg/dL (ref 0.3–1.2)
BUN: 27 mg/dL — AB (ref 6–20)
CALCIUM: 8.8 mg/dL — AB (ref 8.9–10.3)
CO2: 35 mmol/L — ABNORMAL HIGH (ref 22–32)
CREATININE: 1.29 mg/dL — AB (ref 0.44–1.00)
Chloride: 98 mmol/L — ABNORMAL LOW (ref 101–111)
GFR calc Af Amer: 52 mL/min — ABNORMAL LOW (ref >60)
GFR, EST NON AFRICAN AMERICAN: 45 mL/min — AB (ref >60)
Glucose, Bld: 66 mg/dL (ref 65–99)
Potassium: 3.6 mmol/L (ref 3.5–5.1)
Sodium: 140 mmol/L (ref 135–145)
TOTAL PROTEIN: 7.6 g/dL (ref 6.5–8.1)

## 2016-01-14 LAB — CBC WITH DIFFERENTIAL/PLATELET
BASOS ABS: 0.1 10*3/uL (ref 0–0.1)
Basophils Relative: 1 %
EOS PCT: 5 %
Eosinophils Absolute: 0.4 10*3/uL (ref 0–0.7)
HCT: 45.3 % (ref 35.0–47.0)
Hemoglobin: 15 g/dL (ref 12.0–16.0)
LYMPHS PCT: 35 %
Lymphs Abs: 2.4 10*3/uL (ref 1.0–3.6)
MCH: 30.2 pg (ref 26.0–34.0)
MCHC: 33.1 g/dL (ref 32.0–36.0)
MCV: 91.2 fL (ref 80.0–100.0)
MONO ABS: 0.8 10*3/uL (ref 0.2–0.9)
Monocytes Relative: 12 %
Neutro Abs: 3.2 10*3/uL (ref 1.4–6.5)
Neutrophils Relative %: 47 %
PLATELETS: 180 10*3/uL (ref 150–440)
RBC: 4.97 MIL/uL (ref 3.80–5.20)
RDW: 14.3 % (ref 11.5–14.5)
WBC: 6.8 10*3/uL (ref 3.6–11.0)

## 2016-01-14 LAB — TROPONIN I

## 2016-01-14 LAB — RAPID INFLUENZA A&B ANTIGENS (ARMC ONLY): INFLUENZA B (ARMC): NEGATIVE

## 2016-01-14 MED ORDER — ALBUTEROL SULFATE (2.5 MG/3ML) 0.083% IN NEBU
2.5000 mg | INHALATION_SOLUTION | Freq: Once | RESPIRATORY_TRACT | Status: AC
  Administered 2016-01-14: 2.5 mg via RESPIRATORY_TRACT

## 2016-01-14 MED ORDER — DEXTROSE 5 % IV SOLN
500.0000 mg | Freq: Once | INTRAVENOUS | Status: AC
  Administered 2016-01-14: 500 mg via INTRAVENOUS
  Filled 2016-01-14: qty 500

## 2016-01-14 MED ORDER — METHYLPREDNISOLONE SODIUM SUCC 125 MG IJ SOLR
125.0000 mg | Freq: Once | INTRAMUSCULAR | Status: AC
  Administered 2016-01-14: 125 mg via INTRAVENOUS
  Filled 2016-01-14: qty 2

## 2016-01-14 MED ORDER — IPRATROPIUM-ALBUTEROL 0.5-2.5 (3) MG/3ML IN SOLN
RESPIRATORY_TRACT | Status: AC
  Filled 2016-01-14: qty 3

## 2016-01-14 MED ORDER — IOPAMIDOL (ISOVUE-370) INJECTION 76%
75.0000 mL | Freq: Once | INTRAVENOUS | Status: AC | PRN
  Administered 2016-01-14: 75 mL via INTRAVENOUS

## 2016-01-14 MED ORDER — IPRATROPIUM-ALBUTEROL 0.5-2.5 (3) MG/3ML IN SOLN
9.0000 mL | Freq: Once | RESPIRATORY_TRACT | Status: AC
Start: 2016-01-14 — End: 2016-01-14
  Administered 2016-01-14: 9 mL via RESPIRATORY_TRACT

## 2016-01-14 MED ORDER — SODIUM CHLORIDE 0.9 % IV BOLUS (SEPSIS)
500.0000 mL | Freq: Once | INTRAVENOUS | Status: AC
  Administered 2016-01-14: 500 mL via INTRAVENOUS

## 2016-01-14 MED ORDER — ALBUTEROL SULFATE (2.5 MG/3ML) 0.083% IN NEBU
7.5000 mg | INHALATION_SOLUTION | Freq: Once | RESPIRATORY_TRACT | Status: AC
  Administered 2016-01-14: 7.5 mg via RESPIRATORY_TRACT
  Filled 2016-01-14: qty 9

## 2016-01-14 NOTE — ED Notes (Addendum)
Pt placed on 2L [place] for low oxygen. Orders for flu received by Dr. Burlene Arnt.   Pt given mask.  Pt continues to deny SOB. No wheezing present.

## 2016-01-14 NOTE — ED Notes (Signed)
Pt assisted back to stretcher and breathing tx restarted. No distress noted. Provided for comfort and safety and will continue to assess.

## 2016-01-14 NOTE — ED Provider Notes (Signed)
[facility] Emergency Department Provider Note  ____________________________________________   First MD Initiated Contact with Patient 01/14/16 1701     (approximate)  I have reviewed the triage vital signs and the nursing notes.   HISTORY  Chief Complaint Cough and URI   HPI Emily [name] is a 56 y.o. female with a history of diabetes and hypertension who is presenting to the emergency department with 3 days of worsening body aches especially to her lower extremities. She says the left lower extremity is worse than the right but has not noticed any swelling. She denies any chest pain. Denies any fever. Initially was sent to see pod but because of her hypoxia was then transferred back to the major side.   Past Medical History:  Diagnosis Date  . Diabetes mellitus without complication ([place])   . Hypertension     Patient Active Problem List   Diagnosis Date Noted  . Muscle spasm of back 11/05/2015  . Hyperthyroidism 03/13/2015  . Goiter diffuse 03/13/2015  . Diabetes ([place]) 02/07/2015  . Essential hypertension 12/27/2014  . Chronic kidney disease 12/27/2014  . Tobacco abuse 12/27/2014    History reviewed. No pertinent surgical history.  Prior to Admission medications   Medication Sig Start Date End Date Taking? Authorizing Provider  amLODipine (NORVASC) 5 MG tablet Take 1 tablet (5 mg total) by mouth daily. 11/05/15   Teah Doles-Johnson, NP  aspirin 81 MG tablet Take 81 mg by mouth daily.    Historical Provider, MD  atorvastatin (LIPITOR) 20 MG tablet Take 20 mg by mouth daily.    Historical Provider, MD  carvedilol (COREG) 25 MG tablet TAKE ONE TABLET BY MOUTH 2 TIMES A DAY 12/11/15   Larene Beach A McGowan, PA-C  cyclobenzaprine (FLEXERIL) 5 MG tablet Take 1 tablet (5 mg total) by mouth daily as needed for muscle spasms. 11/05/15   Teah Doles-Johnson, NP  glipiZIDE (GLUCOTROL) 10 MG tablet Take 10 mg by mouth 2 (two) times daily before a meal.     Historical Provider, MD  hydrochlorothiazide (HYDRODIURIL) 25 MG tablet Take 1 tablet (25 mg total) by mouth daily. 12/19/15   Nori Riis, PA-C  Insulin Glargine (LANTUS SOLOSTAR) 100 UNIT/ML Solostar Pen Inject 80 Units into the skin daily at 10 pm. 10/22/15   Philomena Doheny, MD  liraglutide 18 MG/3ML SOPN Inject 0.3 mLs (1.8 mg total) into the skin every morning. 10/22/15   Philomena Doheny, MD  lisinopril (PRINIVIL,ZESTRIL) 40 MG tablet Take 1 tablet by mouth daily. 12/26/15   Nori Riis, PA-C  methimazole (TAPAZOLE) 10 MG tablet Take 10 mg by mouth 2 (two) times daily.    Historical Provider, MD  potassium chloride (KLOR-CON) 20 MEQ packet Take 20 mEq by mouth daily.    Historical Provider, MD  sitaGLIPtin (JANUVIA) 50 MG tablet Take 50 mg by mouth daily. 07/24/14   Historical Provider, MD  varenicline (CHANTIX) 1 MG tablet Take 1 mg by mouth 2 (two) times daily. Reported on 07/23/2015 12/27/14   Historical Provider, MD    Allergies Patient has no known allergies.  Family History  Problem Relation Age of Onset  . Diabetes Mother   . Hypertension Mother   . Diabetes Father   . Hypertension Father     Social History Social History  Substance Use Topics  . Smoking status: Current Every Day Smoker    Packs/day: 1.50    Types: Cigarettes  . Smokeless tobacco: Not on file  . Alcohol use  No    Review of Systems Constitutional: No fever/chills Eyes: No visual changes. ENT: No sore throat. Cardiovascular: Denies chest pain. Respiratory: As above Gastrointestinal: No abdominal pain.  No nausea, no vomiting.  No diarrhea.  No constipation. Genitourinary: Negative for dysuria. Musculoskeletal: Negative for back pain. Skin: Negative for rash. Neurological: Negative for headaches, focal weakness or numbness.  10-point ROS otherwise negative.  ____________________________________________   PHYSICAL EXAM:  VITAL SIGNS: ED Triage Vitals  Enc Vitals Group     BP  01/14/16 1330 (!) 135/58     Pulse Rate 01/14/16 1330 76     Resp 01/14/16 1330 18     Temp 01/14/16 1330 98.4 F (36.9 C)     Temp Source 01/14/16 1330 Oral     SpO2 01/14/16 1330 (!) 86 %     Weight 01/14/16 1331 195 lb (88.5 kg)     Height --      Head Circumference --      Peak Flow --      Pain Score 01/14/16 1331 6     Pain Loc --      Pain Edu? --      Excl. in [place]? --     Constitutional: Alert and oriented. Well appearing and in no acute distress. Eyes: Conjunctivae are normal. PERRL. EOMI. Head: Atraumatic. Nose: No congestion/rhinnorhea. Mouth/Throat: Mucous membranes are moist.   Neck: No stridor.   Cardiovascular: Normal rate, regular rhythm. Grossly normal heart sounds.   Respiratory: Normal respiratory effort.  Poor air movement with minimal expiratory wheezing. Gastrointestinal: Soft and nontender. No distention.  Musculoskeletal: No lower extremity tenderness nor edema.  No joint effusions. Neurologic:  Normal speech and language. No gross focal neurologic deficits are appreciated.  Skin:  Skin is warm, dry and intact. No rash noted. Psychiatric: Mood and affect are normal. Speech and behavior are normal.  ____________________________________________   LABS (all labs ordered are listed, but only abnormal results are displayed)  Labs Reviewed  COMPREHENSIVE METABOLIC PANEL - Abnormal; Notable for the following:       Result Value   Chloride 98 (*)    CO2 35 (*)    BUN 27 (*)    Creatinine, Ser 1.29 (*)    Calcium 8.8 (*)    GFR calc non Af Amer 45 (*)    GFR calc Af Amer 52 (*)    All other components within normal limits  RAPID INFLUENZA A&B ANTIGENS ([facility] ONLY)  CBC WITH DIFFERENTIAL/PLATELET  TROPONIN I   ____________________________________________  EKG  ED ECG REPORT I, Doran Stabler, the attending physician, personally viewed and interpreted this ECG.   Date: 01/14/2016  EKG Time: 1334  Rate: 75  Rhythm: normal sinus rhythm   Axis: normal  Intervals:none  ST&T Change: No ST segment elevation or depression. No abnormal T-wave inversion.  ____________________________________________  [id number]    DG Chest 2 View (Final result)  Result time 01/14/16 14:12:43  Final result by Elon Alas, MD (01/14/16 14:12:43)           Narrative:   CLINICAL DATA: Productive cough for 1 week. History of pneumonia, hypertension, diabetes, smoker.  EXAM: CHEST 2 VIEW  COMPARISON: None.  FINDINGS: Cardiac silhouette is mildly enlarged. Mediastinal silhouette is nonsuspicious. Pulmonary vascular congestion and mild interstitial prominence. No pleural effusion or focal consolidation. No pneumothorax. Offset of the LEFT acromioclavicular joint compatible with old shoulder separation. Mild degenerative change of thoracic spine. Soft tissue planes are nonsuspicious. Surgical clips in  the included right abdomen compatible with cholecystectomy.  IMPRESSION: Mild cardiomegaly and interstitial prominence concerning for pulmonary edema, less likely atypical infection scratch.  LEFT lung base atelectasis.   Electronically Signed By: Elon Alas M.D. On: 01/14/2016 14:12            ____________________________________________   PROCEDURES  Procedure(s) performed:   Procedures  Critical Care performed:   ____________________________________________   INITIAL IMPRESSION / ASSESSMENT AND PLAN / ED COURSE  Pertinent labs & imaging results that were available during my care of the patient were reviewed by me and considered in my medical decision making (see chart for details).   Clinical Course    ----------------------------------------- 8:09 PM on 01/14/2016 -----------------------------------------  Patient still hypoxic after breathing treatments as well as steroids. Lungs auscultated and still with expiratory wheezes  Coarse rhonchi. Patient now on 2 L and with an oxygen  saturation of 87%. Increased to 4 L and oxygen saturation increased to 95%.  Reassuring CAT scan. Patient will be admitted to the hospital. Possible new onset COPD. The patient is a smoker. Signed out to the hospitalist, Dr. Leslye Peer.    ____________________________________________   FINAL CLINICAL IMPRESSION(S) / ED DIAGNOSES  Bronchitis. Hypoxia.    NEW MEDICATIONS STARTED DURING THIS VISIT:  New Prescriptions   No medications on file     Note:  This document was prepared using Dragon voice recognition software and may include unintentional dictation errors.    Orbie Pyo, MD 01/14/16 2011

## 2016-01-14 NOTE — H&P (Signed)
History and Physical   [facility] [place] [facility] Admission History and Physical [employer], D.O.    Patient Name: Emily [name] MR#: [id number] Date of Birth: 31-Oct-1960 Date of Admission: 01/14/2016  Referring MD/NP/PA: Dr. Clearnce Hasten Primary Care Physician: Boyce Medici, FNP Patient coming from: Home  Chief Complaint: Cough  HPI: Emily [name] is a 56 y.o. female with a known history of diabetes, hypertension, hyperthyroidism, chronic kidney disease was in a usual state of health until 3-4 days ago when she reports generalized weakness, muscle and joint aches, nasal congestion and a dry, nonproductive cough. She came to the emergency department today because her symptoms were not improving. She did not seek any medical attention or take any medications for her symptoms. Initially she was found to be hypoxic in the emergency department, received DuoNeb's, steroids and O2. Her saturations improved from high 70s to 94% on 4 L of O2 and therefore hospitalists or contacted requesting admission.   Otherwise there has been no change in status. Patient has been taking medication as prescribed and there has been no recent change in medication or diet.  No recent antibiotics.  There has been no recent illness, hospitalizations, travel or sick contacts.    Patient denies fevers/chills, weakness, dizziness, chest pain, shortness of breath, N/V/C/D, abdominal pain, dysuria/frequency, changes in mental status.   Review of Systems:  CONSTITUTIONAL: No fever/chills, fatigue, weakness, weight gain/loss, headache. EYES: No blurry or double vision. ENT: No tinnitus, postnasal drip, redness or soreness of the oropharynx. Positive nasal congestion RESPIRATORY: Positive cough, dyspnea, wheeze.  No hemoptysis.  CARDIOVASCULAR: No chest pain, palpitations, syncope, orthopnea. No lower extremity edema.  GASTROINTESTINAL: No nausea, vomiting, abdominal pain, diarrhea, constipation.  No  hematemesis, melena or hematochezia. GENITOURINARY: No dysuria, frequency, hematuria. ENDOCRINE: No polyuria or nocturia. No heat or cold intolerance. HEMATOLOGY: No anemia, bruising, bleeding. INTEGUMENTARY: No rashes, ulcers, lesions. MUSCULOSKELETAL: No arthritis, gout, dyspnea. NEUROLOGIC: No numbness, tingling, ataxia, seizure-type activity, weakness. PSYCHIATRIC: No anxiety, depression, insomnia.   Past Medical History:  Diagnosis Date  . Diabetes mellitus without complication ([place])   . Hypertension     History reviewed. No pertinent surgical history.   reports that she has been smoking Cigarettes.  She has been smoking about 1.50 packs per day. She does not have any smokeless tobacco history on file. She reports that she does not drink alcohol. Her drug history is not on file.  No Known Allergies  Family History  Problem Relation Age of Onset  . Diabetes Mother   . Hypertension Mother   . Diabetes Father   . Hypertension Father    Family history has been reviewed and confirmed with patient.   Prior to Admission medications   Medication Sig Start Date End Date Taking? Authorizing Provider  amLODipine (NORVASC) 5 MG tablet Take 1 tablet (5 mg total) by mouth daily. 11/05/15  Yes Teah Doles-Johnson, NP  aspirin 81 MG tablet Take 81 mg by mouth daily.   Yes Historical Provider, MD  atorvastatin (LIPITOR) 10 MG tablet Take 10 mg by mouth daily.    Yes Historical Provider, MD  carvedilol (COREG) 25 MG tablet TAKE ONE TABLET BY MOUTH 2 TIMES A DAY 12/11/15  Yes Shannon A McGowan, PA-C  glipiZIDE (GLUCOTROL) 10 MG tablet Take 10 mg by mouth 2 (two) times daily before a meal.   Yes Historical Provider, MD  hydrochlorothiazide (HYDRODIURIL) 25 MG tablet Take 1 tablet (25 mg total) by mouth daily. 12/19/15  Yes Larene Beach  A McGowan, PA-C  Insulin Glargine (LANTUS SOLOSTAR) 100 UNIT/ML Solostar Pen Inject 80 Units into the skin daily at 10 pm. 10/22/15  Yes Philomena Doheny, MD   lisinopril (PRINIVIL,ZESTRIL) 40 MG tablet Take 1 tablet by mouth daily. 12/26/15  Yes Shannon A McGowan, PA-C  potassium chloride (KLOR-CON) 20 MEQ packet Take 20 mEq by mouth daily.   Yes Historical Provider, MD  sitaGLIPtin (JANUVIA) 50 MG tablet Take 50 mg by mouth daily. 07/24/14  Yes Historical Provider, MD  cyclobenzaprine (FLEXERIL) 5 MG tablet Take 1 tablet (5 mg total) by mouth daily as needed for muscle spasms. Patient not taking: Reported on 01/14/2016 11/05/15   Teah Doles-Johnson, NP  liraglutide 18 MG/3ML SOPN Inject 0.3 mLs (1.8 mg total) into the skin every morning. Patient not taking: Reported on 01/14/2016 10/22/15   Philomena Doheny, MD  methimazole (TAPAZOLE) 10 MG tablet Take 10 mg by mouth 2 (two) times daily.    Historical Provider, MD  varenicline (CHANTIX) 1 MG tablet Take 1 mg by mouth 2 (two) times daily. Reported on 07/23/2015 12/27/14   Historical Provider, MD    Physical Exam: Vitals:   01/14/16 1843 01/14/16 1845 01/14/16 1900 01/14/16 2030  BP: (!) 148/88  123/73 140/84  Pulse: 67  69 71  Resp: 19  (!) 29 18  Temp:      TempSrc:      SpO2: 91% 97% 93% (!) 89%  Weight:        GENERAL: 56 y.o.-year-old Black female patient, well-developed, well-nourished lying in the bed in no acute distress.  Pleasant and cooperative.   HEENT: Head atraumatic, normocephalic. Pupils equal, round, reactive to light and accommodation. Proptosis bilaterally No scleral icterus. Extraocular muscles intact. Nares are patent. Oropharynx is clear. Mucus membranes moist. NECK: Supple, full range of motion. No JVD, no bruit heard. Positive thyromegaly,, no tenderness, no cervical lymphadenopathy. CHEST: Good air movement, mild diffuse expiratory wheezing. No use of accessory muscles of respiration.  No reproducible chest wall tenderness.  CARDIOVASCULAR: S1, S2 normal. No murmurs, rubs, or gallops. Cap refill <2 seconds. Pulses intact distally.  ABDOMEN: Soft, nondistended, nontender. No  rebound, guarding, rigidity. Normoactive bowel sounds present in all four quadrants. No organomegaly or mass. EXTREMITIES: No pedal edema, cyanosis, or clubbing. No calf tenderness or Homan's sign.  NEUROLOGIC: The patient is alert and oriented x 3. Cranial nerves II through XII are grossly intact with no focal sensorimotor deficit. Muscle strength 5/5 in all extremities. Sensation intact. Gait not checked. PSYCHIATRIC:  Normal affect, mood, thought content. SKIN: Warm, dry, and intact without obvious rash, lesion, or ulcer.    Labs on Admission:  CBC:  Recent Labs Lab 01/14/16 1707  WBC 6.8  NEUTROABS 3.2  HGB 15.0  HCT 45.3  MCV 91.2  PLT 694   Basic Metabolic Panel:  Recent Labs Lab 01/14/16 1707  NA 140  K 3.6  CL 98*  CO2 35*  GLUCOSE 66  BUN 27*  CREATININE 1.29*  CALCIUM 8.8*   GFR: CrCl cannot be calculated (Unknown ideal weight.). Liver Function Tests:  Recent Labs Lab 01/14/16 1707  AST 18  ALT 16  ALKPHOS 71  BILITOT 0.5  PROT 7.6  ALBUMIN 3.7   No results for input(s): LIPASE, AMYLASE in the last 168 hours. No results for input(s): AMMONIA in the last 168 hours. Coagulation Profile: No results for input(s): INR, PROTIME in the last 168 hours. Cardiac Enzymes:  Recent Labs Lab 01/14/16 1707  TROPONINI <  0.03   BNP (last 3 results) No results for input(s): PROBNP in the last 8760 hours. HbA1C: No results for input(s): HGBA1C in the last 72 hours. CBG: No results for input(s): GLUCAP in the last 168 hours. Lipid Profile: No results for input(s): CHOL, HDL, LDLCALC, TRIG, CHOLHDL, LDLDIRECT in the last 72 hours. Thyroid Function Tests: No results for input(s): TSH, T4TOTAL, FREET4, T3FREE, THYROIDAB in the last 72 hours. Anemia Panel: No results for input(s): VITAMINB12, FOLATE, FERRITIN, TIBC, IRON, RETICCTPCT in the last 72 hours. Urine analysis:    Component Value Date/Time   COLORURINE Yellow 10/16/2012 0812   APPEARANCEUR Hazy  10/16/2012 0812   LABSPEC 1.016 10/16/2012 0812   PHURINE 5.0 10/16/2012 0812   GLUCOSEU Negative 10/16/2012 0812   HGBUR 1+ 10/16/2012 0812   BILIRUBINUR Negative 10/16/2012 0812   KETONESUR Negative 10/16/2012 0812   PROTEINUR 30 mg/dL 10/16/2012 0812   NITRITE Negative 10/16/2012 0812   LEUKOCYTESUR Negative 10/16/2012 0812   Sepsis Labs: @LABRCNTIP (procalcitonin:4,lacticidven:4) ) Recent Results (from the past 240 hour(s))  Rapid Influenza A&B Antigens ([facility] only)     Status: None   Collection Time: 01/14/16  1:40 PM  Result Value Ref Range Status   Influenza A ([facility]) NEGATIVE NEGATIVE Final   Influenza B ([facility]) NEGATIVE NEGATIVE Final     Radiological Exams on Admission: Dg Chest 2 View  Result Date: 01/14/2016 CLINICAL DATA:  Productive cough for 1 week. History of pneumonia, hypertension, diabetes, smoker. EXAM: CHEST  2 VIEW COMPARISON:  None. FINDINGS: Cardiac silhouette is mildly enlarged. Mediastinal silhouette is nonsuspicious. Pulmonary vascular congestion and mild interstitial prominence. No pleural effusion or focal consolidation. No pneumothorax. Offset of the LEFT acromioclavicular joint compatible with old shoulder separation. Mild degenerative change of thoracic spine. Soft tissue planes are nonsuspicious. Surgical clips in the included right abdomen compatible with cholecystectomy. IMPRESSION: Mild cardiomegaly and interstitial prominence concerning for pulmonary edema, less likely atypical infection scratch. LEFT lung base atelectasis. Electronically Signed   By: Elon Alas M.D.   On: 01/14/2016 14:12   Ct Angio Chest Pe W And/or Wo Contrast  Result Date: 01/14/2016 CLINICAL DATA:  56 year old female with cough, upper respiratory infection, headache and body ache longer than 1 week. Indeterminate chest x-ray today. Initial encounter. EXAM: CT ANGIOGRAPHY CHEST WITH CONTRAST TECHNIQUE: Multidetector CT imaging of the chest was performed using the standard  protocol during bolus administration of intravenous contrast. Multiplanar CT image reconstructions and MIPs were obtained to evaluate the vascular anatomy. CONTRAST:  75 mL Isovue 370 COMPARISON:  Chest radiographs 1354 hours today. FINDINGS: Cardiovascular: Good contrast bolus timing in the pulmonary arterial tree. No focal filling defect identified in the pulmonary arteries to suggest acute pulmonary embolism. Mild cardiomegaly. No pericardial effusion. Negative thoracic aorta. Mild calcified coronary artery atherosclerosis. Minimal visible upper abdominal aortic calcified plaque. Mediastinum/Nodes: No lymphadenopathy; maximal bilateral hilar lymph nodes. Generalized thyromegaly at the thoracic inlet. No significant airway mass effect. No discrete thyroid nodule by CT. Lungs/Pleura: Major airways are patent. Atelectatic changes to the airways about the hila. Mild dependent pulmonary atelectasis. Borderline to mild mosaic attenuation in both lungs. No consolidation. No pleural effusion. Upper Abdomen: Gallbladder is surgically absent. Negative visualized liver and spleen. Negative visualized adrenal glands, stomach and pancreas. Diverticulosis at the splenic flexure. Musculoskeletal: Degenerative changes in the spine. Endplate osteophytosis. No acute osseous abnormality identified. Review of the MIP images confirms the above findings. IMPRESSION: 1.  No evidence of acute pulmonary embolus. 2. No focal pneumonia. Low lung  volumes with mild atelectasis. No pulmonary edema or pleural effusion. 3. Moderate to severe Thyromegaly. 4. Mild calcified coronary artery atherosclerosis. Electronically Signed   By: Genevie Ann M.D.   On: 01/14/2016 18:42    EKG: Normal sinus rhythm at 75 bpm with normal axis and nonspecific ST-T wave changes.   Assessment/Plan Active Problems:   * No active hospital problems. *    This is a 56 y.o. female with a history of diabetes, hypertension, hyperthyroidism, chronic kidney disease  now being admitted with:  1. 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  - Continuous pulse oximetry - Consider pulmonary consult if not improving.  - Patient will need PFT as an outpatient follow-up for ongoing care.  2. AKI Acute kidney injury  - IV fluids and repeat BMP in AM.  - Bladder scan and place foley catheter if evidence of urinary retention  3. History of DM  - Continue Lantus, Cover with RISS ACHS  4. History of HTN  - Continue Coreg, HCTZ, Lisinopril, Norvasc  5. History of hyperthyroidism  - Continue Tapazole  6. History of hyperlipidemia  - Continue Lipitor, Aspirin  Admission status: Inpatient IV Fluids: NS Diet/Nutrition: HH,CC Consults called: None  DVT Px: Lovenox, SCDs and early ambulation. Code Status: Full Code  Disposition Plan: To home in 2-3 days   All the records are reviewed and case discussed with ED provider. Management plans discussed with the patient and/or family who express understanding and agree with plan of care.  Jaryn Hocutt D.O. on 01/14/2016 at 9:04 PM Between 7am to 6pm - Pager - [phone] After 6pm go to [email or web address] - [employer] [facility] Office [phone] CC: Primary care physician; Boyce Medici, FNP   01/14/2016, 9:04 PM

## 2016-01-14 NOTE — ED Notes (Signed)
Pt assisted with ambulating to bedside toilet.  ambulated with steady gait with no c/o pain or sob. Tolerated well.

## 2016-01-14 NOTE — ED Notes (Signed)
Spoke with lab. Order placed 1701 and still cannot print barcode labels. Will send with temp labels.

## 2016-01-14 NOTE — ED Triage Notes (Signed)
Pt c/o cough, URI, headaches, body aches longer than 1 week.

## 2016-01-14 NOTE — ED Notes (Signed)
Admitting MD at the bedside for pt evaluation.  

## 2016-01-14 NOTE — ED Notes (Addendum)
Pt up to bedside toilet with standby assist. Pt alert with no increased work of breathing noted at this time. Breathing tx to be completed once back to stretcher. Pt denies pain or sob currently.

## 2016-01-14 NOTE — ED Notes (Signed)
Dr Clearnce Hasten notified increase in O2

## 2016-01-14 NOTE — ED Notes (Addendum)
Pt given [place] sandwich tray and soda per MD request. Pt notified of pending admission. Provided for comfort and safety and will continue to assess.

## 2016-01-14 NOTE — ED Notes (Signed)
Patient transported to CT 

## 2016-01-15 ENCOUNTER — Encounter: Payer: Self-pay | Admitting: *Deleted

## 2016-01-15 LAB — TSH: TSH: 2.84 u[IU]/mL (ref 0.350–4.500)

## 2016-01-15 LAB — GLUCOSE, CAPILLARY
GLUCOSE-CAPILLARY: 322 mg/dL — AB (ref 65–99)
GLUCOSE-CAPILLARY: 448 mg/dL — AB (ref 65–99)
Glucose-Capillary: 354 mg/dL — ABNORMAL HIGH (ref 65–99)
Glucose-Capillary: 357 mg/dL — ABNORMAL HIGH (ref 65–99)
Glucose-Capillary: 370 mg/dL — ABNORMAL HIGH (ref 65–99)

## 2016-01-15 MED ORDER — INSULIN ASPART 100 UNIT/ML ~~LOC~~ SOLN: 0.0000 [IU] | Freq: Every day | SUBCUTANEOUS | Status: DC

## 2016-01-15 MED ORDER — INSULIN GLARGINE 100 UNIT/ML ~~LOC~~ SOLN
80.0000 [IU] | Freq: Every day | SUBCUTANEOUS | Status: DC
  Filled 2016-01-15: qty 0.8

## 2016-01-15 MED ORDER — ATORVASTATIN CALCIUM 10 MG PO TABS
10.0000 mg | ORAL_TABLET | Freq: Every day | ORAL | Status: DC
  Administered 2016-01-15 – 2016-01-16 (×2): 10 mg via ORAL
  Filled 2016-01-15 (×2): qty 1

## 2016-01-15 MED ORDER — GUAIFENESIN ER 600 MG PO TB12
600.0000 mg | ORAL_TABLET | Freq: Two times a day (BID) | ORAL | Status: DC
  Administered 2016-01-15 – 2016-01-16 (×3): 600 mg via ORAL
  Filled 2016-01-15 (×3): qty 1

## 2016-01-15 MED ORDER — NICOTINE 14 MG/24HR TD PT24
14.0000 mg | MEDICATED_PATCH | Freq: Every day | TRANSDERMAL | Status: DC
Start: 2016-01-15 — End: 2016-01-16
  Administered 2016-01-15 – 2016-01-16 (×2): 14 mg via TRANSDERMAL
  Filled 2016-01-15 (×2): qty 1

## 2016-01-15 MED ORDER — SODIUM CHLORIDE 0.9 % IV SOLN
INTRAVENOUS | Status: DC
  Administered 2016-01-15 – 2016-01-16 (×3): via INTRAVENOUS

## 2016-01-15 MED ORDER — OXYCODONE HCL 5 MG PO TABS
5.0000 mg | ORAL_TABLET | ORAL | Status: DC | PRN
Start: 2016-01-15 — End: 2016-01-16

## 2016-01-15 MED ORDER — ENOXAPARIN SODIUM 40 MG/0.4ML ~~LOC~~ SOLN
40.0000 mg | SUBCUTANEOUS | Status: DC
  Administered 2016-01-15: 40 mg via SUBCUTANEOUS
  Filled 2016-01-15: qty 0.4

## 2016-01-15 MED ORDER — BISACODYL 5 MG PO TBEC: 5.0000 mg | DELAYED_RELEASE_TABLET | Freq: Every day | ORAL | Status: DC | PRN

## 2016-01-15 MED ORDER — INSULIN ASPART 100 UNIT/ML ~~LOC~~ SOLN
0.0000 [IU] | Freq: Three times a day (TID) | SUBCUTANEOUS | Status: DC
  Administered 2016-01-15 – 2016-01-16 (×3): 20 [IU] via SUBCUTANEOUS
  Filled 2016-01-15 (×3): qty 20

## 2016-01-15 MED ORDER — METHIMAZOLE 10 MG PO TABS
10.0000 mg | ORAL_TABLET | Freq: Two times a day (BID) | ORAL | Status: DC
  Administered 2016-01-15 – 2016-01-16 (×3): 10 mg via ORAL
  Filled 2016-01-15 (×4): qty 1

## 2016-01-15 MED ORDER — ONDANSETRON HCL 4 MG PO TABS: 4.0000 mg | ORAL_TABLET | Freq: Four times a day (QID) | ORAL | Status: DC | PRN

## 2016-01-15 MED ORDER — RISAQUAD PO CAPS
2.0000 | ORAL_CAPSULE | Freq: Three times a day (TID) | ORAL | Status: DC
  Administered 2016-01-15 – 2016-01-16 (×4): 2 via ORAL
  Filled 2016-01-15 (×4): qty 2

## 2016-01-15 MED ORDER — MAGNESIUM CITRATE PO SOLN
1.0000 | Freq: Once | ORAL | Status: DC | PRN
  Filled 2016-01-15: qty 296

## 2016-01-15 MED ORDER — SENNOSIDES-DOCUSATE SODIUM 8.6-50 MG PO TABS: 1.0000 | ORAL_TABLET | Freq: Every evening | ORAL | Status: DC | PRN

## 2016-01-15 MED ORDER — POTASSIUM CHLORIDE 20 MEQ PO PACK
20.0000 meq | PACK | Freq: Every day | ORAL | Status: DC
  Administered 2016-01-15 – 2016-01-16 (×2): 20 meq via ORAL
  Filled 2016-01-15 (×2): qty 1

## 2016-01-15 MED ORDER — AZITHROMYCIN 250 MG PO TABS
500.0000 mg | ORAL_TABLET | Freq: Every day | ORAL | Status: DC
  Administered 2016-01-15: 500 mg via ORAL
  Filled 2016-01-15: qty 2

## 2016-01-15 MED ORDER — DEXTROSE 5 % IV SOLN
500.0000 mg | INTRAVENOUS | Status: DC
  Filled 2016-01-15: qty 500

## 2016-01-15 MED ORDER — AMLODIPINE BESYLATE 5 MG PO TABS
5.0000 mg | ORAL_TABLET | Freq: Every day | ORAL | Status: DC
  Administered 2016-01-15 – 2016-01-16 (×2): 5 mg via ORAL
  Filled 2016-01-15 (×2): qty 1

## 2016-01-15 MED ORDER — LISINOPRIL 20 MG PO TABS
40.0000 mg | ORAL_TABLET | Freq: Every day | ORAL | Status: DC
  Administered 2016-01-15 – 2016-01-16 (×2): 40 mg via ORAL
  Filled 2016-01-15 (×2): qty 2

## 2016-01-15 MED ORDER — CARVEDILOL 12.5 MG PO TABS
25.0000 mg | ORAL_TABLET | Freq: Two times a day (BID) | ORAL | Status: DC
  Administered 2016-01-15 – 2016-01-16 (×3): 25 mg via ORAL
  Filled 2016-01-15 (×3): qty 2

## 2016-01-15 MED ORDER — ACETAMINOPHEN 325 MG PO TABS: 650.0000 mg | ORAL_TABLET | Freq: Four times a day (QID) | ORAL | Status: DC | PRN

## 2016-01-15 MED ORDER — INSULIN ASPART 100 UNIT/ML ~~LOC~~ SOLN
0.0000 [IU] | Freq: Three times a day (TID) | SUBCUTANEOUS | Status: DC
  Administered 2016-01-15: 20 [IU] via SUBCUTANEOUS
  Administered 2016-01-15: 15 [IU] via SUBCUTANEOUS
  Administered 2016-01-15: 20 [IU] via SUBCUTANEOUS
  Filled 2016-01-15: qty 20
  Filled 2016-01-15: qty 15
  Filled 2016-01-15: qty 20

## 2016-01-15 MED ORDER — INSULIN GLARGINE 100 UNIT/ML SOLOSTAR PEN: 80.0000 [IU] | PEN_INJECTOR | Freq: Every day | SUBCUTANEOUS | Status: DC

## 2016-01-15 MED ORDER — INSULIN GLARGINE 100 UNIT/ML ~~LOC~~ SOLN
46.0000 [IU] | Freq: Every day | SUBCUTANEOUS | Status: DC
  Administered 2016-01-15: 46 [IU] via SUBCUTANEOUS
  Filled 2016-01-15 (×2): qty 0.46

## 2016-01-15 MED ORDER — DEXTROMETHORPHAN POLISTIREX ER 30 MG/5ML PO SUER
30.0000 mg | Freq: Two times a day (BID) | ORAL | Status: DC
  Administered 2016-01-15 – 2016-01-16 (×3): 30 mg via ORAL
  Filled 2016-01-15 (×8): qty 5

## 2016-01-15 MED ORDER — ALBUTEROL SULFATE (2.5 MG/3ML) 0.083% IN NEBU
2.5000 mg | INHALATION_SOLUTION | Freq: Four times a day (QID) | RESPIRATORY_TRACT | Status: DC | PRN
Start: 2016-01-15 — End: 2016-01-16

## 2016-01-15 MED ORDER — ASPIRIN 81 MG PO CHEW
81.0000 mg | CHEWABLE_TABLET | Freq: Two times a day (BID) | ORAL | Status: DC
  Administered 2016-01-15 – 2016-01-16 (×3): 81 mg via ORAL
  Filled 2016-01-15 (×3): qty 1

## 2016-01-15 MED ORDER — ACETAMINOPHEN 650 MG RE SUPP: 650.0000 mg | Freq: Four times a day (QID) | RECTAL | Status: DC | PRN

## 2016-01-15 MED ORDER — DM-GUAIFENESIN ER 30-600 MG PO TB12: 1.0000 | ORAL_TABLET | Freq: Two times a day (BID) | ORAL | Status: DC

## 2016-01-15 MED ORDER — ONDANSETRON HCL 4 MG/2ML IJ SOLN: 4.0000 mg | Freq: Four times a day (QID) | INTRAMUSCULAR | Status: DC | PRN

## 2016-01-15 MED ORDER — HYDROCHLOROTHIAZIDE 25 MG PO TABS
25.0000 mg | ORAL_TABLET | Freq: Every day | ORAL | Status: DC
  Administered 2016-01-15 – 2016-01-16 (×2): 25 mg via ORAL
  Filled 2016-01-15 (×2): qty 1

## 2016-01-15 MED ORDER — IPRATROPIUM BROMIDE 0.02 % IN SOLN: 0.5000 mg | Freq: Four times a day (QID) | RESPIRATORY_TRACT | Status: DC | PRN

## 2016-01-15 MED ORDER — METHYLPREDNISOLONE SODIUM SUCC 125 MG IJ SOLR
60.0000 mg | INTRAMUSCULAR | Status: DC
  Administered 2016-01-15: 60 mg via INTRAVENOUS
  Filled 2016-01-15: qty 2

## 2016-01-15 NOTE — ED Notes (Signed)
Pt to restroom without assistance. No distress noted. Provided for comfort and safety and placed back on cardiac monitor. Will continue to assess.

## 2016-01-15 NOTE — Progress Notes (Signed)
Inpatient Diabetes Program Recommendations  AACE/ADA: New Consensus Statement on Inpatient Glycemic Control (2015)  Target Ranges:  Prepandial:   less than 140 mg/dL      Peak postprandial:   less than 180 mg/dL (1-2 hours)      Critically ill patients:  140 - 180 mg/dL   Results for Emily [name], Emily [name] (MRN [id number]) as of 01/15/2016 09:37  Ref. Range 01/15/2016 07:27  Glucose-Capillary Latest Ref Range: 65 - 99 mg/dL 354 (H)  Results for Emily [name], Emily [name] (MRN [id number]) as of 01/15/2016 09:37  Ref. Range 01/14/2016 17:07  Glucose Latest Ref Range: 65 - 99 mg/dL 66   Results for Emily [name], Emily [name] (MRN [id number]) as of 01/15/2016 09:37  Ref. Range 07/16/2015 18:29  Hemoglobin A1C Latest Ref Range: 4.8 - 5.6 % 12.3 (H)    Review of Glycemic Control  Diabetes history: DM2 Outpatient Diabetes medications: Lantus 80 units QHS, Glipizide 10 mg BID, Januvia 50 mg daily Current orders for Inpatient glycemic control: Lantus 80 units QHS, Novolog 0-20 units TID with meals, Novolog 0-5 units QHS  Inpatient Diabetes Program Recommendations: Insulin - Basal: Per chart review, patient last took Lantus 80 units at home on 01/13/16 and no Lantus given since admitted to hospital. Initial glucose 66 mg/dl on 01/14/16 and fasting glucose 354 mg/dl today. Please consider decreasing Lantus to 46 units (based on 92 kg x 0.5 units) and order Q24H starting now so patient will receive basal insulin this morning. HgbA1C: Please consider ordering an A1C to evaluate glycemic control over the past 2-3 months. Last A1C in the chart was 12.3% on 07/16/2015.   NOTE: In reviewing chart, noted patient sees Dr. Cruzita Lederer (Endocrinologist) for thyroid disease but DM is managed by PCP.   Thanks, Barnie Alderman, RN, MSN, CDE Diabetes Coordinator Inpatient Diabetes Program [phone] (Team Pager from 8am to 5pm)

## 2016-01-15 NOTE — Evaluation (Signed)
Physical Therapy Evaluation Patient Details Name: Emily [name] MRN: [id number] DOB: 08/22/1960 Today's Date: 01/15/2016   History of Present Illness  Pt is a 56 y.o. female presenting to hospital with cough, 3 days worsening body aches, URI, and found to be hypoxic in ED.  Pt admitted with acute hypoxic respiratory failure secondary to bronchitis/COPD and acute kidney injury.  PMH includes DM, htn, and CKD.  Clinical Impression  Prior to hospital admission, pt was independent (no home O2 use).  Pt lives with her mother in 1 level home with steps to enter.  Currently pt is modified independent with bed mobility, independent with transfers, and SBA with ambulation around nursing loop without AD.  Pt's O2 initially 93% on 2 L/min O2 via nasal cannula at rest; decreased to 89% after ambulating 75 feet (increased back to 94% with vc's for pursed lip breathing) and with consistent vc's for purse lip breathing pt's O2 91% or greater rest of ambulation on 2 L O2.  Pt requiring extra cueing and time (vc's and demonstration) for pursed lip breathing technique.  Pt would benefit from skilled PT to address noted impairments and functional limitations during hospital stay.  Recommend pt discharge to home with support of family when medically appropriate; no PT needs anticipated upon hospital discharge.    Follow Up Recommendations No PT follow up    Equipment Recommendations  None recommended by PT    Recommendations for Other Services       Precautions / Restrictions Precautions Precautions: Fall Restrictions Weight Bearing Restrictions: No      Mobility  Bed Mobility Overal bed mobility: Modified Independent             General bed mobility comments: Supine to/from sit with HOB elevated without any difficulties.  Transfers Overall transfer level: Independent Equipment used: None             General transfer comment: Sit to/from stand without any  difficulties  Ambulation/Gait Ambulation/Gait assistance: Supervision Ambulation Distance (Feet): 230 Feet Assistive device: None Gait Pattern/deviations: WFL(Within Functional Limits)   Gait velocity interpretation: at or above normal speed for age/gender General Gait Details: steady without loss of balance  Stairs            Wheelchair Mobility    Modified Rankin (Stroke Patients Only)       Balance Overall balance assessment: Modified Independent  Pt scored 28/28 on Tinetti balance assessment indicating pt is at low risk for falls.                                         Pertinent Vitals/Pain Pain Assessment: No/denies pain  HR WFL during session.    Home Living Family/patient expects to be discharged to:: Private residence Living Arrangements: Parent (Pt's mother) Available Help at Discharge: Family Type of Home: House Home Access: Stairs to enter Entrance Stairs-Rails: None Entrance Stairs-Number of Steps: 4 Home Layout: One level Home Equipment: None      Prior Function Level of Independence: Independent         Comments: Pt denies any falls in past 6 months.     Hand Dominance        Extremity/Trunk Assessment   Upper Extremity Assessment Upper Extremity Assessment: Overall WFL for tasks assessed    Lower Extremity Assessment Lower Extremity Assessment: Overall WFL for tasks assessed       Communication  Communication: No difficulties  Cognition Arousal/Alertness: Awake/alert Behavior During Therapy: WFL for tasks assessed/performed Overall Cognitive Status: Within Functional Limits for tasks assessed                      General Comments General comments (skin integrity, edema, etc.): Pt's aunt present during session.  Nursing cleared pt for participation in physical therapy (nurse requesting pt to stay on O2 for ambulation d/t pt desaturating walking to bathroom earlier).  Pt agreeable to PT  session.     Exercises     Assessment/Plan    PT Assessment Patient needs continued PT services  PT Problem List Cardiopulmonary status limiting activity          PT Treatment Interventions Functional mobility training;Therapeutic activities;Therapeutic exercise;Patient/family education    PT Goals (Current goals can be found in the Care Plan section)  Acute Rehab PT Goals Patient Stated Goal: to go home PT Goal Formulation: With patient Time For Goal Achievement: 01/29/16 Potential to Achieve Goals: Good    Frequency Min 2X/week   Barriers to discharge        Co-evaluation               End of Session Equipment Utilized During Treatment: Gait belt;Oxygen (2 L O2 via nasal cannula) Activity Tolerance: Patient tolerated treatment well Patient left: in bed;with call bell/phone within reach;with family/visitor present (Pt moderate fall risk score (no alarm required)) Nurse Communication: Mobility status;Precautions         Time: 4492-0100 PT Time Calculation (min) (ACUTE ONLY): 20 min   Charges:   PT Evaluation $PT Eval Low Complexity: 1 Procedure     PT G CodesLeitha Bleak February 13, 2016, 12:39 PM Leitha Bleak, [place]

## 2016-01-15 NOTE — Plan of Care (Signed)
Problem: Physical Regulation: Goal: Will remain free from infection Outcome: Progressing Educated patient on the need for good hand hygiene to prevent infection

## 2016-01-15 NOTE — ED Notes (Signed)
Pt asking for something else to eat and a soda. Pt alert and talkative at this time. Breathing tx complete. Pt has no c/o pain and no increased work of breathing noted at this time.

## 2016-01-15 NOTE — Progress Notes (Signed)
Called Dr.Willis about patient Blood sugar; order changed per MD; Will continue to monitor.

## 2016-01-15 NOTE — Progress Notes (Signed)
Emily [name] at [place] NAME: Emily [name]    MR#:  [id number]  DATE OF BIRTH:  16-Apr-1960  SUBJECTIVE: admitted  for cough, shortness of breath, COPD exacerbation, acute renal failure. Patient says that she feels better but still needing oxygen .  CHIEF COMPLAINT:   Chief Complaint  Patient presents with  . Cough  . URI    REVIEW OF SYSTEMS:   ROS CONSTITUTIONAL: No fever, fatigue or weakness.  EYES: No blurred or double vision.  EARS, NOSE, AND THROAT: No tinnitus or ear pain.  RESPIRATORY: , Shortness of breath. cough  CARDIOVASCULAR: No chest pain, orthopnea, edema.  GASTROINTESTINAL: No nausea, vomiting, diarrhea or abdominal pain.  GENITOURINARY: No dysuria, hematuria.  ENDOCRINE: No polyuria, nocturia,  HEMATOLOGY: No anemia, easy bruising or bleeding SKIN: No rash or lesion. MUSCULOSKELETAL: No joint pain or arthritis.   NEUROLOGIC: No tingling, numbness, weakness.  PSYCHIATRY: No anxiety or depression.   DRUG ALLERGIES:  No Known Allergies  VITALS:  Blood pressure (!) 141/74, pulse 78, temperature 98.2 F (36.8 C), temperature source Oral, resp. rate 18, height 5' (1.524 m), weight 92.3 kg (203 lb 7.8 oz), SpO2 93 %.  PHYSICAL EXAMINATION:  GENERAL:  56 y.o.-year-old patient lying in the bed with no acute distress.  EYES: Pupils equal, round, reactive to light and accommodation. No scleral icterus. Extraocular muscles intact.  HEENT: Head atraumatic, normocephalic. Oropharynx and nasopharynx clear.  NECK:  Supple, no jugular venous distention. No thyroid enlargement, no tenderness.  LUNGS:  breath sounds bilaterally, Faint wheezing bilaterally. rales,rhonchi or crepitation. No use of accessory muscles of respiration.  CARDIOVASCULAR: S1, S2 normal. No murmurs, rubs, or gallops.  ABDOMEN: Soft, nontender, nondistended. Bowel sounds present. No organomegaly or mass.  EXTREMITIES: No pedal edema, cyanosis, or  clubbing.  NEUROLOGIC: Cranial nerves II through XII are intact. Muscle strength 5/5 in all extremities. Sensation intact. Gait not checked.  PSYCHIATRIC: The patient is alert and oriented x 3.  SKIN: No obvious rash, lesion, or ulcer.    LABORATORY PANEL:   CBC  Recent Labs Lab 01/14/16 1707  WBC 6.8  HGB 15.0  HCT 45.3  PLT 180   ------------------------------------------------------------------------------------------------------------------  Chemistries   Recent Labs Lab 01/14/16 1707  NA 140  K 3.6  CL 98*  CO2 35*  GLUCOSE 66  BUN 27*  CREATININE 1.29*  CALCIUM 8.8*  AST 18  ALT 16  ALKPHOS 71  BILITOT 0.5   ------------------------------------------------------------------------------------------------------------------  Cardiac Enzymes  Recent Labs Lab 01/14/16 1707  TROPONINI <0.03   ------------------------------------------------------------------------------------------------------------------  RADIOLOGY:  Dg Chest 2 View  Result Date: 01/14/2016 CLINICAL DATA:  Productive cough for 1 week. History of pneumonia, hypertension, diabetes, smoker. EXAM: CHEST  2 VIEW COMPARISON:  None. FINDINGS: Cardiac silhouette is mildly enlarged. Mediastinal silhouette is nonsuspicious. Pulmonary vascular congestion and mild interstitial prominence. No pleural effusion or focal consolidation. No pneumothorax. Offset of the LEFT acromioclavicular joint compatible with old shoulder separation. Mild degenerative change of thoracic spine. Soft tissue planes are nonsuspicious. Surgical clips in the included right abdomen compatible with cholecystectomy. IMPRESSION: Mild cardiomegaly and interstitial prominence concerning for pulmonary edema, less likely atypical infection scratch. LEFT lung base atelectasis. Electronically Signed   By: Elon Alas M.D.   On: 01/14/2016 14:12   Ct Angio Chest Pe W And/or Wo Contrast  Result Date: 01/14/2016 CLINICAL DATA:  56 year old  female with cough, upper respiratory infection, headache and body ache longer than 1 week. Indeterminate  chest x-ray today. Initial encounter. EXAM: CT ANGIOGRAPHY CHEST WITH CONTRAST TECHNIQUE: Multidetector CT imaging of the chest was performed using the standard protocol during bolus administration of intravenous contrast. Multiplanar CT image reconstructions and MIPs were obtained to evaluate the vascular anatomy. CONTRAST:  75 mL Isovue 370 COMPARISON:  Chest radiographs 1354 hours today. FINDINGS: Cardiovascular: Good contrast bolus timing in the pulmonary arterial tree. No focal filling defect identified in the pulmonary arteries to suggest acute pulmonary embolism. Mild cardiomegaly. No pericardial effusion. Negative thoracic aorta. Mild calcified coronary artery atherosclerosis. Minimal visible upper abdominal aortic calcified plaque. Mediastinum/Nodes: No lymphadenopathy; maximal bilateral hilar lymph nodes. Generalized thyromegaly at the thoracic inlet. No significant airway mass effect. No discrete thyroid nodule by CT. Lungs/Pleura: Major airways are patent. Atelectatic changes to the airways about the hila. Mild dependent pulmonary atelectasis. Borderline to mild mosaic attenuation in both lungs. No consolidation. No pleural effusion. Upper Abdomen: Gallbladder is surgically absent. Negative visualized liver and spleen. Negative visualized adrenal glands, stomach and pancreas. Diverticulosis at the splenic flexure. Musculoskeletal: Degenerative changes in the spine. Endplate osteophytosis. No acute osseous abnormality identified. Review of the MIP images confirms the above findings. IMPRESSION: 1.  No evidence of acute pulmonary embolus. 2. No focal pneumonia. Low lung volumes with mild atelectasis. No pulmonary edema or pleural effusion. 3. Moderate to severe Thyromegaly. 4. Mild calcified coronary artery atherosclerosis. Electronically Signed   By: Genevie Ann M.D.   On: 01/14/2016 18:42    EKG:    Orders placed or performed during the hospital encounter of 01/14/16  . ED EKG  . ED EKG    ASSESSMENT AND PLAN:   Acute respiratory failure with hypoxia secondary to COPD exacerbation: Patient heavy smoker . Smokes 1 pack per day for a long time. Add nicotine patch, continue IV steroids, azithromycin, nebulizers, continue oxygen, evaluate for home oxygen need at rest, on ambulation check oxygen levels. Patient needs outpatient primary for further testing, likely discharge tomorrow.  #2. Acute renal failure: Improved with IV hydration #3 diabetes mellitus type 2: 2 blood sugars secondary to steroids: Patient seen by diabetes coordinator, had hypoglycemia initial glucose of 66, glucose 354 today. Lantus was decreased to 46 units, check hemoglobin A1c. #4 essential hypertension; controlled. #5 hypothyroidism: Continue Tapazole.    All the records are reviewed and case discussed with Care Management/Social Workerr. Management plans discussed with the patient, family and they are in agreement.  CODE STATUS: full  TOTAL TIME TAKING CARE OF THIS PATIENT:35 minutes.   POSSIBLE D/C IN 1-2  DAYS, DEPENDING ON CLINICAL CONDITION.   Epifanio Lesches M.D on 01/15/2016 at 2:33 PM  Between 7am to 6pm - Pager - [phone]  After 6pm go to [email or web address] - password [email or web address] [facility]  Office  [phone]  CC: Primary care physician; Boyce Medici, FNP   Note: This dictation was prepared with Dragon dictation along with smaller phrase technology. Any transcriptional errors that result from this process are unintentional.

## 2016-01-16 LAB — HEMOGLOBIN A1C
Hgb A1c MFr Bld: 10.3 % — ABNORMAL HIGH (ref 4.8–5.6)
MEAN PLASMA GLUCOSE: 249 mg/dL

## 2016-01-16 LAB — GLUCOSE, CAPILLARY
GLUCOSE-CAPILLARY: 370 mg/dL — AB (ref 65–99)
GLUCOSE-CAPILLARY: 394 mg/dL — AB (ref 65–99)
Glucose-Capillary: 361 mg/dL — ABNORMAL HIGH (ref 65–99)
Glucose-Capillary: 370 mg/dL — ABNORMAL HIGH (ref 65–99)

## 2016-01-16 MED ORDER — INSULIN GLARGINE 100 UNIT/ML ~~LOC~~ SOLN
40.0000 [IU] | Freq: Two times a day (BID) | SUBCUTANEOUS | 11 refills | Status: DC
Start: 2016-01-16 — End: 2016-01-16

## 2016-01-16 MED ORDER — TIOTROPIUM BROMIDE MONOHYDRATE 18 MCG IN CAPS: 18.0000 ug | ORAL_CAPSULE | Freq: Every day | RESPIRATORY_TRACT | 12 refills | Status: DC

## 2016-01-16 MED ORDER — ALBUTEROL SULFATE HFA 108 (90 BASE) MCG/ACT IN AERS: 2.0000 | INHALATION_SPRAY | Freq: Four times a day (QID) | RESPIRATORY_TRACT | 2 refills | Status: DC | PRN

## 2016-01-16 MED ORDER — AZITHROMYCIN 250 MG PO TABS: ORAL_TABLET | ORAL | 0 refills | Status: DC

## 2016-01-16 MED ORDER — INSULIN ASPART 100 UNIT/ML ~~LOC~~ SOLN: 0.0000 [IU] | Freq: Three times a day (TID) | SUBCUTANEOUS | 11 refills | Status: DC

## 2016-01-16 MED ORDER — PREDNISONE 10 MG (21) PO TBPK: 10.0000 mg | ORAL_TABLET | Freq: Every day | ORAL | 0 refills | Status: DC

## 2016-01-16 MED ORDER — AZITHROMYCIN 250 MG PO TABS
ORAL_TABLET | ORAL | 0 refills | Status: DC
Start: 2016-01-16 — End: 2016-01-16

## 2016-01-16 NOTE — Progress Notes (Signed)
Inpatient Diabetes Program Recommendations  AACE/ADA: New Consensus Statement on Inpatient Glycemic Control (2015)  Target Ranges:  Prepandial:   less than 140 mg/dL      Peak postprandial:   less than 180 mg/dL (1-2 hours)      Critically ill patients:  140 - 180 mg/dL   Results for Emily [name], Emily [name] (MRN [id number]) as of 01/16/2016 08:03  Ref. Range 01/15/2016 07:27 01/15/2016 11:47 01/15/2016 16:32 01/15/2016 16:48 01/15/2016 21:20 01/16/2016 03:27 01/16/2016 05:47 01/16/2016 07:27  Glucose-Capillary Latest Ref Range: 65 - 99 mg/dL 354 (H) 357 (H) 370 (H) 322 (H) 448 (H) 370 (H) 361 (H) 370 (H)   Review of Glycemic Control  Diabetes history: DM2 Outpatient Diabetes medications: Lantus 80 units QHS, Glipizide 10 mg BID, Januvia 50 mg daily Current orders for Inpatient glycemic control: Lantus 46 units QHS, Novolog 0-20 units TID with meals, Novolog 0-5 units QHS  Inpatient Diabetes Program Recommendations: Insulin - Basal: Patient received Lantus 46 units last night and fasting glucose 370 mg/dl today. Also ordered Solumedrol 60 mg Q24H. Please consider increasing Lantus to 40 units BID (starting now). Insulin - Meal Coverage: Please consider ordering Novolog 5 units TID with meals for meal coverage if patient eats at least 50% of meal. HgbA1C: A1C 10.3% on 01/14/16 indicating an average glucose of 249 mg/dl over the past 2-3 months.  Thanks, Barnie Alderman, RN, MSN, CDE Diabetes Coordinator Inpatient Diabetes Program [phone] (Team Pager from 8am to 5pm)

## 2016-01-16 NOTE — Discharge Summary (Signed)
Emily [name], is a 56 y.o. female  DOB 23-Jan-1960  MRN [id number].  Admission date:  01/14/2016  Admitting Physician  Harvie Bridge, DO  Discharge Date:  01/16/2016   Primary MD  Boyce Medici, FNP  Recommendations for primary care physician for things to follow:  Follow-up with the primary doctor in one week. F/w medication management clinic for DMII meds.   Admission Diagnosis  Bronchitis [J40] Hypoxia [R09.02]   Discharge Diagnosis  Bronchitis [J40] Hypoxia [R09.02]    Active Problems:   Acute respiratory failure with hypoxia [facility])      Past Medical History:  Diagnosis Date  . Diabetes mellitus without complication ([place])   . Hypertension     History reviewed. No pertinent surgical history.     History of present illness and  Hospital Course:     Kindly see H&P for history of present illness and admission details, please review complete Labs, Consult reports and Test reports for all details in brief   Hospital Course  56 year old female patient presents with hypertension, diabetes mellitus, hypothyroidism, chronic kidney disease came in because of shortness of breath, cough, hypoxia with oxygen saturations 70-94% on 4 L, admitted for COPD exacerbation. Acute respiratory failure with bronchitis: Received IV steroids, nebulizers, empiric antibiotics. Patient ambulatory pulse ox, pulse ox at rest on room air , oxygen saturations remained more than 88%. Did not qualify for home oxygen. discharge home with tapering course of prednisone, azithromycin, albuterol inhaler.  #2 tobacco abuse: Heavy smoker smokes about more than 30 years 1 pack per day, advised to quit smoking, for nicotine patch, patient started on Spiriva, needs PFTs as an outpatient, scheduled appointment with Dr. Raul Del requested.  #3 acute  kidney injury: Improved with hydration. #4 diabetes mellitus type 2 noncompliance with medications: Hemoglobin A1c 10.3. Seen by diabetes coordinator. Her hyperglycemia secondary to steroids., Patient takes glipizide, 10 mg bid, lantus  80 units daily at bedtime, Januvia 50 mg daily, advised to continue that.      Discharge Condition:stable   Follow UP  Follow-up Information    Wallene Huh, MD. Schedule an appointment as soon as possible for a visit.   Specialty:  Specialist Why:  first availalable for  copd,needs PFT and ongoing appointments Primary doctor needs to make a referral Contact information: [place] [address] [place], [place]. Go on 01/23/2016.   Specialty:  Nurse Practitioner Why:  [facility] at 7:00pm Contact information: [address] [place] [address] [phone]             Discharge Instructions  and  Discharge Medications     Allergies as of 01/16/2016   No Known Allergies     Medication List    TAKE these medications   albuterol 108 (90 Base) MCG/ACT inhaler Commonly known as:  PROVENTIL HFA;VENTOLIN HFA Inhale 2 puffs into the lungs every 6 (six) hours as needed for wheezing or shortness of breath.   amLODipine 5 MG tablet Commonly known as:  NORVASC Take 1 tablet (5 mg total) by mouth daily.   aspirin 81 MG tablet Take 81 mg by mouth daily.   atorvastatin 10 MG tablet Commonly known as:  LIPITOR Take 10 mg by mouth daily.   azithromycin 250 MG tablet Commonly known as:  ZITHROMAX One tab daily   carvedilol 25 MG tablet Commonly known as:  COREG TAKE ONE TABLET BY MOUTH  2 TIMES A DAY   CHANTIX 1 MG tablet Generic drug:  varenicline Take 1 mg by mouth 2 (two) times daily. Reported on 07/23/2015   cyclobenzaprine 5 MG tablet Commonly known as:  FLEXERIL Take 1 tablet (5 mg total) by mouth daily as needed for muscle spasms.    glipiZIDE 10 MG tablet Commonly known as:  GLUCOTROL Take 10 mg by mouth 2 (two) times daily before a meal.   hydrochlorothiazide 25 MG tablet Commonly known as:  HYDRODIURIL Take 1 tablet (25 mg total) by mouth daily.   Insulin Glargine 100 UNIT/ML Solostar Pen Commonly known as:  LANTUS SOLOSTAR Inject 80 Units into the skin daily at 10 pm.   liraglutide 18 MG/3ML Sopn Inject 0.3 mLs (1.8 mg total) into the skin every morning.   lisinopril 40 MG tablet Commonly known as:  PRINIVIL,ZESTRIL Take 1 tablet by mouth daily.   methimazole 10 MG tablet Commonly known as:  TAPAZOLE Take 10 mg by mouth 2 (two) times daily.   potassium chloride 20 MEQ packet Commonly known as:  KLOR-CON Take 20 mEq by mouth daily.   predniSONE 10 MG (21) Tbpk tablet Commonly known as:  STERAPRED UNI-PAK 21 TAB Take 1 tablet (10 mg total) by mouth daily. Taper by 10 mg daily   sitaGLIPtin 50 MG tablet Commonly known as:  JANUVIA Take 50 mg by mouth daily.   tiotropium 18 MCG inhalation capsule Commonly known as:  SPIRIVA HANDIHALER Place 1 capsule (18 mcg total) into inhaler and inhale daily.         Diet and Activity recommendation: See Discharge Instructions above   Consults obtained -diabetes coordinator  Major procedures and Radiology Reports - PLEASE review detailed and final reports for all details, in brief -     Dg Chest 2 View  Result Date: 01/14/2016 CLINICAL DATA:  Productive cough for 1 week. History of pneumonia, hypertension, diabetes, smoker. EXAM: CHEST  2 VIEW COMPARISON:  None. FINDINGS: Cardiac silhouette is mildly enlarged. Mediastinal silhouette is nonsuspicious. Pulmonary vascular congestion and mild interstitial prominence. No pleural effusion or focal consolidation. No pneumothorax. Offset of the LEFT acromioclavicular joint compatible with old shoulder separation. Mild degenerative change of thoracic spine. Soft tissue planes are nonsuspicious. Surgical clips in  the included right abdomen compatible with cholecystectomy. IMPRESSION: Mild cardiomegaly and interstitial prominence concerning for pulmonary edema, less likely atypical infection scratch. LEFT lung base atelectasis. Electronically Signed   By: Elon Alas M.D.   On: 01/14/2016 14:12   Ct Angio Chest Pe W And/or Wo Contrast  Result Date: 01/14/2016 CLINICAL DATA:  56 year old female with cough, upper respiratory infection, headache and body ache longer than 1 week. Indeterminate chest x-ray today. Initial encounter. EXAM: CT ANGIOGRAPHY CHEST WITH CONTRAST TECHNIQUE: Multidetector CT imaging of the chest was performed using the standard protocol during bolus administration of intravenous contrast. Multiplanar CT image reconstructions and MIPs were obtained to evaluate the vascular anatomy. CONTRAST:  75 mL Isovue 370 COMPARISON:  Chest radiographs 1354 hours today. FINDINGS: Cardiovascular: Good contrast bolus timing in the pulmonary arterial tree. No focal filling defect identified in the pulmonary arteries to suggest acute pulmonary embolism. Mild cardiomegaly. No pericardial effusion. Negative thoracic aorta. Mild calcified coronary artery atherosclerosis. Minimal visible upper abdominal aortic calcified plaque. Mediastinum/Nodes: No lymphadenopathy; maximal bilateral hilar lymph nodes. Generalized thyromegaly at the thoracic inlet. No significant airway mass effect. No discrete thyroid nodule by CT. Lungs/Pleura: Major airways are patent. Atelectatic changes to the airways about the hila.  Mild dependent pulmonary atelectasis. Borderline to mild mosaic attenuation in both lungs. No consolidation. No pleural effusion. Upper Abdomen: Gallbladder is surgically absent. Negative visualized liver and spleen. Negative visualized adrenal glands, stomach and pancreas. Diverticulosis at the splenic flexure. Musculoskeletal: Degenerative changes in the spine. Endplate osteophytosis. No acute osseous abnormality  identified. Review of the MIP images confirms the above findings. IMPRESSION: 1.  No evidence of acute pulmonary embolus. 2. No focal pneumonia. Low lung volumes with mild atelectasis. No pulmonary edema or pleural effusion. 3. Moderate to severe Thyromegaly. 4. Mild calcified coronary artery atherosclerosis. Electronically Signed   By: Genevie Ann M.D.   On: 01/14/2016 18:42    Micro Results    Recent Results (from the past 240 hour(s))  Rapid Influenza A&B Antigens ([place] only)     Status: None   Collection Time: 01/14/16  1:40 PM  Result Value Ref Range Status   Influenza A ([facility]) NEGATIVE NEGATIVE Final   Influenza B ([facility]) NEGATIVE NEGATIVE Final       Today   Subjective:   Emily [name] today has no headache,no chest abdominal pain,no new weakness tingling or numbness, feels much better wants to go home today.  Objective:   Blood pressure (!) 142/80, pulse 72, temperature 98.1 F (36.7 C), temperature source Oral, resp. rate (!) 22, height 5' (1.524 m), weight 92.3 kg (203 lb 7.8 oz), SpO2 92 %.   Intake/Output Summary (Last 24 hours) at 01/16/16 1427 Last data filed at 01/16/16 1257  Gross per 24 hour  Intake              913 ml  Output             4050 ml  Net            -3137 ml    Exam Awake Alert, Oriented x 3, No new F.N deficits, Normal affect [place].AT,PERRAL Supple Neck,No JVD, No cervical lymphadenopathy appriciated.  Symmetrical Chest wall movement, Good air movement bilaterally, CTAB RRR,No Gallops,Rubs or new Murmurs, No Parasternal Heave +ve B.Sounds, Abd Soft, Non tender, No organomegaly appriciated, No rebound -guarding or rigidity. No Cyanosis, Clubbing or edema, No new Rash or bruise  Data Review   CBC w Diff: Lab Results  Component Value Date   WBC 6.8 01/14/2016   HGB 15.0 01/14/2016   HGB 13.5 06/02/2013   HCT 45.3 01/14/2016   HCT 41.7 06/02/2013   PLT 180 01/14/2016   PLT 158 06/02/2013   LYMPHOPCT 35 01/14/2016   LYMPHOPCT 34.7  04/23/2013   MONOPCT 12 01/14/2016   MONOPCT 11 06/02/2013   MONOPCT 8.1 04/23/2013   EOSPCT 5 01/14/2016   EOSPCT 1.3 04/23/2013   BASOPCT 1 01/14/2016   BASOPCT 0.8 04/23/2013    CMP: Lab Results  Component Value Date   NA 140 01/14/2016   NA 144 01/31/2015   NA 140 06/02/2013   K 3.6 01/14/2016   K 3.5 06/02/2013   CL 98 (L) 01/14/2016   CL 107 06/02/2013   CO2 35 (H) 01/14/2016   CO2 26 06/02/2013   BUN 27 (H) 01/14/2016   BUN 14 01/31/2015   BUN 14 06/02/2013   CREATININE 1.29 (H) 01/14/2016   CREATININE 0.65 06/02/2013   GLU 201 01/31/2015   PROT 7.6 01/14/2016   PROT 7.7 06/02/2013   ALBUMIN 3.7 01/14/2016   ALBUMIN 3.0 (L) 06/02/2013   BILITOT 0.5 01/14/2016   BILITOT 0.3 06/02/2013   ALKPHOS 71 01/14/2016   ALKPHOS 127 (H)  06/02/2013   AST 18 01/14/2016   AST 21 06/02/2013   ALT 16 01/14/2016   ALT 27 06/02/2013  .   Total Time in preparing paper work, data evaluation and todays exam - 8 minutes  Emily [name] M.D on 01/16/2016 at 2:27 PM    Note: This dictation was prepared with Dragon dictation along with smaller phrase technology. Any transcriptional errors that result from this process are unintentional.

## 2016-01-16 NOTE — Progress Notes (Addendum)
Inpatient Diabetes Program Recommendations  AACE/ADA: New Consensus Statement on Inpatient Glycemic Control (2015)  Target Ranges:  Prepandial:   less than 140 mg/dL      Peak postprandial:   less than 180 mg/dL (1-2 hours)      Critically ill patients:  140 - 180 mg/dL   Lab Results  Component Value Date   GLUCAP 394 (H) 01/16/2016   HGBA1C 10.3 (H) 01/14/2016    Review of Glycemic Control  Results for PAETYN, Emily [name] (MRN [id number]) as of 01/16/2016 11:48  Ref. Range 01/15/2016 21:20 01/16/2016 03:27 01/16/2016 05:47 01/16/2016 07:27 01/16/2016 11:22  Glucose-Capillary Latest Ref Range: 65 - 99 mg/dL 448 (H) 370 (H) 361 (H) 370 (H) 394 (H)   Diabetes history:DM2 Outpatient Diabetes medications: Lantus 80 units QHS, Glipizide 10 mg BID, Januvia 50 mg daily Current orders for Inpatient glycemic control: Lantus 46 units QHS, Novolog 0-20 units TID with meals, Novolog 0-5 units QHS  Inpatient Diabetes Program Recommendations:  Please resume diabetes medications as she was taking them before the admission;  Lantus 80 units QHS, Glipizide 10 mg BID, Januvia 50 mg daily  Since patient will no longer be on IV insulin, oral steroids are being tapered and Glipizide will be restarted, patient will not need Novolog correction at discharge.  Discussed with case manager Isaias Cowman.  Gentry Fitz, RN, BA, MHA, CDE Diabetes Coordinator Inpatient Diabetes Program  [phone] (Team Pager) [phone] ([place]) 01/16/2016 11:51 AM

## 2016-01-16 NOTE — Progress Notes (Signed)
SATURATION QUALIFICATIONS: (This note is used to comply with regulatory documentation for home oxygen)  Patient Saturations on Room Air at Rest = 92%  Patient Saturations on Room Air while Ambulating = 89%    Please briefly explain why patient needs home oxygen: Pt ambulated 177ft with SPO2 dropping to 89% and then coming back up.

## 2016-01-16 NOTE — Progress Notes (Signed)
Pt A and O x 4. VSS. Pt tolerating diet well. No complaints of pain or nausea. IV removed intact, prescriptions given. Pt knows to go to med management to get prescriptions filled Pt voiced understanding of discharge instructions with no further questions. Pt discharged via wheelchair with axillary.

## 2016-01-16 NOTE — Progress Notes (Signed)
Per Dr. Vianne Bulls place order to discharge patient.

## 2016-01-23 ENCOUNTER — Ambulatory Visit: Payer: Self-pay

## 2016-01-28 ENCOUNTER — Other Ambulatory Visit: Payer: Self-pay

## 2016-01-28 NOTE — Telephone Encounter (Signed)
Received PAP application from [facility] for Lantus placed for provider to sign. 

## 2016-01-30 ENCOUNTER — Ambulatory Visit: Payer: Self-pay | Admitting: Adult Health Nurse Practitioner

## 2016-01-30 VITALS — BP 128/90 | HR 80 | Wt 199.4 lb

## 2016-01-30 DIAGNOSIS — E119 Type 2 diabetes mellitus without complications: Secondary | ICD-10-CM

## 2016-01-30 DIAGNOSIS — E108 Type 1 diabetes mellitus with unspecified complications: Secondary | ICD-10-CM

## 2016-01-30 LAB — GLUCOSE, POCT (MANUAL RESULT ENTRY): POC Glucose: 492 mg/dl — AB (ref 70–99)

## 2016-01-30 MED ORDER — POTASSIUM CHLORIDE 20 MEQ PO PACK: 20.0000 meq | PACK | Freq: Every day | ORAL | 12 refills | Status: DC

## 2016-01-30 MED ORDER — METHIMAZOLE 10 MG PO TABS: 10.0000 mg | ORAL_TABLET | Freq: Two times a day (BID) | ORAL | 4 refills | Status: DC

## 2016-01-30 MED ORDER — INSULIN GLARGINE 100 UNIT/ML SOLOSTAR PEN: 45.0000 [IU] | PEN_INJECTOR | Freq: Two times a day (BID) | SUBCUTANEOUS | 6 refills | Status: DC

## 2016-01-30 NOTE — Progress Notes (Signed)
Subjective:    Patient ID: Emily [name], female    DOB: 15-Nov-1960, 56 y.o.   MRN: [id number]  HPI  Patient presents today with blood glucose of 492-ate cake and fruit cocktail beforehand Fasting glucose this morning was 187  Pt was in hospital last week for COPD   Pt no longer smokes as of Jan 2018  Patient Active Problem List   Diagnosis Date Noted  . Acute respiratory failure with hypoxia ([place]) 01/14/2016  . Muscle spasm of back 11/05/2015  . Hyperthyroidism 03/13/2015  . Goiter diffuse 03/13/2015  . Diabetes ([place]) 02/07/2015  . Essential hypertension 12/27/2014  . Chronic kidney disease 12/27/2014  . Tobacco abuse 12/27/2014   Allergies as of 01/30/2016   No Known Allergies     Medication List       Accurate as of 01/30/16  7:43 PM. Always use your most recent med list.          albuterol 108 (90 Base) MCG/ACT inhaler Commonly known as:  PROVENTIL HFA;VENTOLIN HFA Inhale 2 puffs into the lungs every 6 (six) hours as needed for wheezing or shortness of breath.   amLODipine 5 MG tablet Commonly known as:  NORVASC Take 1 tablet (5 mg total) by mouth daily.   aspirin 81 MG tablet Take 81 mg by mouth daily.   atorvastatin 10 MG tablet Commonly known as:  LIPITOR Take 10 mg by mouth daily.   azithromycin 250 MG tablet Commonly known as:  ZITHROMAX One tab daily   carvedilol 25 MG tablet Commonly known as:  COREG TAKE ONE TABLET BY MOUTH 2 TIMES A DAY   CHANTIX 1 MG tablet Generic drug:  varenicline Take 1 mg by mouth 2 (two) times daily. Reported on 07/23/2015   cyclobenzaprine 5 MG tablet Commonly known as:  FLEXERIL Take 1 tablet (5 mg total) by mouth daily as needed for muscle spasms.   glipiZIDE 10 MG tablet Commonly known as:  GLUCOTROL Take 10 mg by mouth 2 (two) times daily before a meal.   hydrochlorothiazide 25 MG tablet Commonly known as:  HYDRODIURIL Take 1 tablet (25 mg total) by mouth daily.   Insulin Glargine 100 UNIT/ML  Solostar Pen Commonly known as:  LANTUS SOLOSTAR Inject 80 Units into the skin daily at 10 pm.   liraglutide 18 MG/3ML Sopn Inject 0.3 mLs (1.8 mg total) into the skin every morning.   lisinopril 40 MG tablet Commonly known as:  PRINIVIL,ZESTRIL Take 1 tablet by mouth daily.   methimazole 10 MG tablet Commonly known as:  TAPAZOLE Take 10 mg by mouth 2 (two) times daily.   potassium chloride 20 MEQ packet Commonly known as:  KLOR-CON Take 20 mEq by mouth daily.   predniSONE 10 MG (21) Tbpk tablet Commonly known as:  STERAPRED UNI-PAK 21 TAB Take 1 tablet (10 mg total) by mouth daily. Taper by 10 mg daily   sitaGLIPtin 50 MG tablet Commonly known as:  JANUVIA Take 50 mg by mouth daily.   tiotropium 18 MCG inhalation capsule Commonly known as:  SPIRIVA HANDIHALER Place 1 capsule (18 mcg total) into inhaler and inhale daily.         Review of Systems BP 128/90 (BP Location: Left Arm, Patient Position: Sitting, Cuff Size: Normal)   Pulse 80   Wt 199 lb 6.4 oz (90.4 kg)   BMI 38.94 kg/m      Objective:   Physical Exam  Constitutional: She is oriented to person, place, and time.  Cardiovascular: Normal  rate and regular rhythm.   Pulmonary/Chest: Effort normal and breath sounds normal.  Neurological: She is alert and oriented to person, place, and time.   Pain in left leg        Assessment & Plan:  Pt encouraged to stop smoking Lantus dose changed to 45 2x daily  Pt may have diabetic neuropathy in left leg or arthritis

## 2016-01-30 NOTE — Patient Instructions (Addendum)
Follow up in 3 months

## 2016-01-31 ENCOUNTER — Other Ambulatory Visit: Payer: Self-pay | Admitting: Adult Health Nurse Practitioner

## 2016-01-31 NOTE — Telephone Encounter (Signed)
Placed signed application/script in [facility] folder for pickup. 

## 2016-02-03 ENCOUNTER — Telehealth: Payer: Self-pay | Admitting: Pharmacist

## 2016-02-03 NOTE — Telephone Encounter (Signed)
[employer] PAP refill submitted to manufacturer today. Lantus Solostar insulin, 80 units, once daily at 10 pm.

## 2016-02-05 ENCOUNTER — Other Ambulatory Visit: Payer: Self-pay

## 2016-02-10 IMAGING — US US RENAL
1 series · 14 of 24 positions shown · non-contrast
Comparison: None.

CLINICAL DATA: Decreased GFR, elevated serum creatinine, chronic
kidney disease stage 3

EXAM:
RENAL / URINARY TRACT ULTRASOUND COMPLETE

[Series 1: us renal · 0.22mm/px · 14 of 24 slices shown]
[im 1/24]
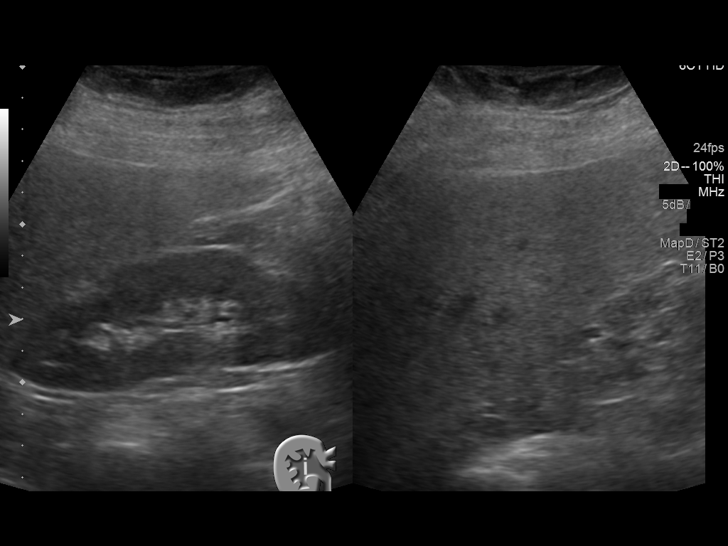
[im 3/24]
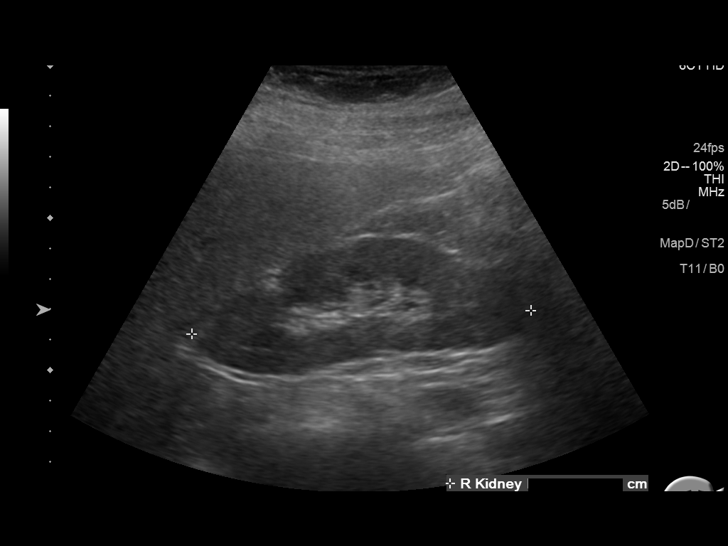
[im 5/24]
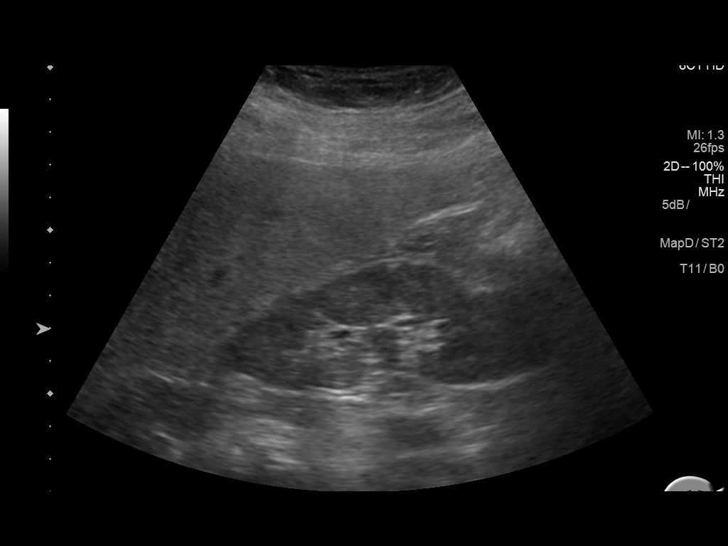
[im 7/24]
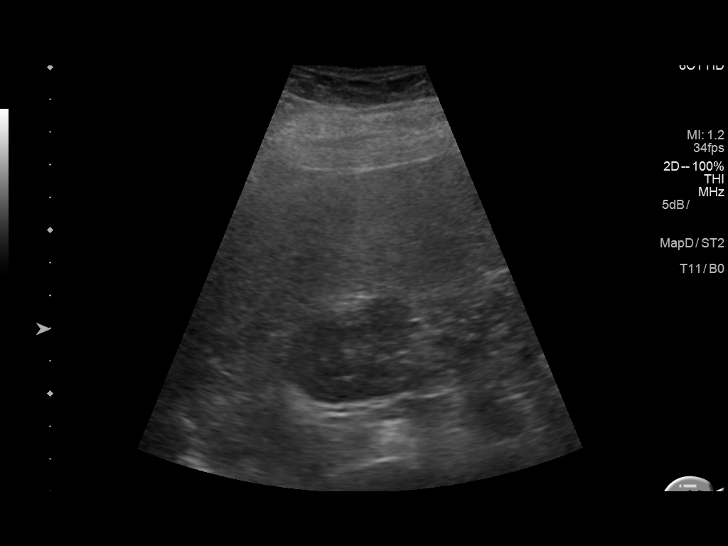
[im 8/24]
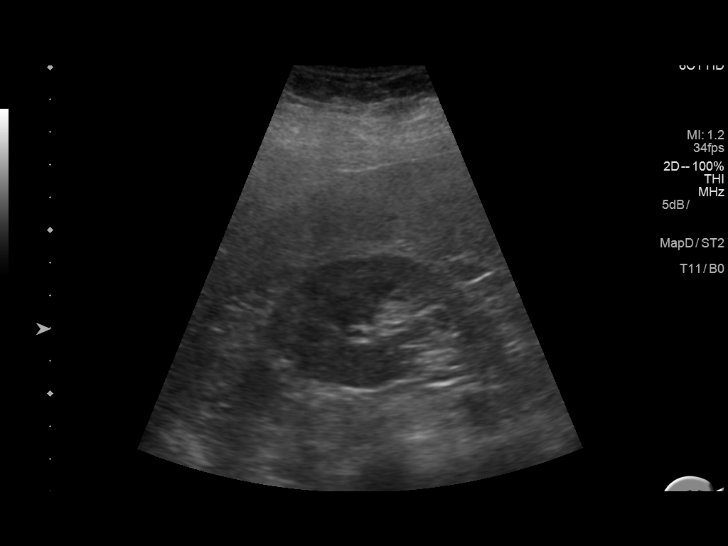
[im 10/24]
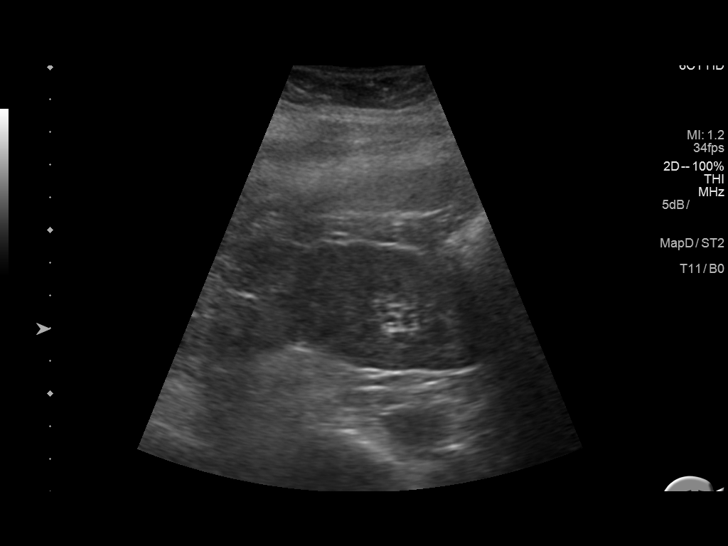
[im 12/24]
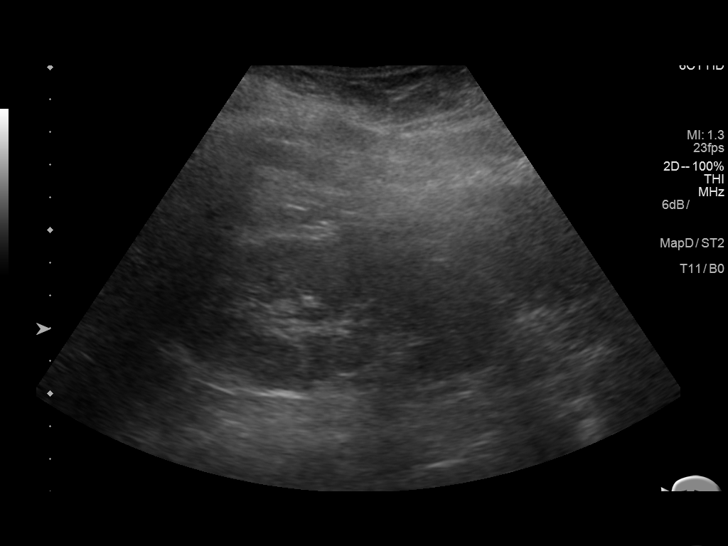
[im 13/24]
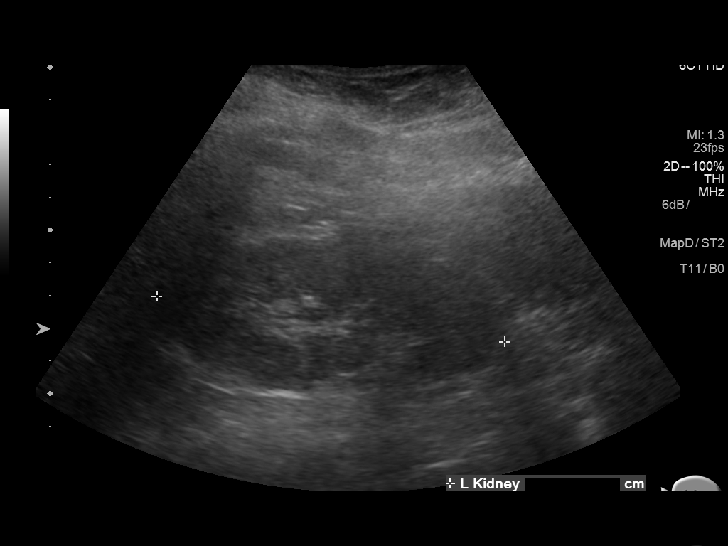
[im 15/24]
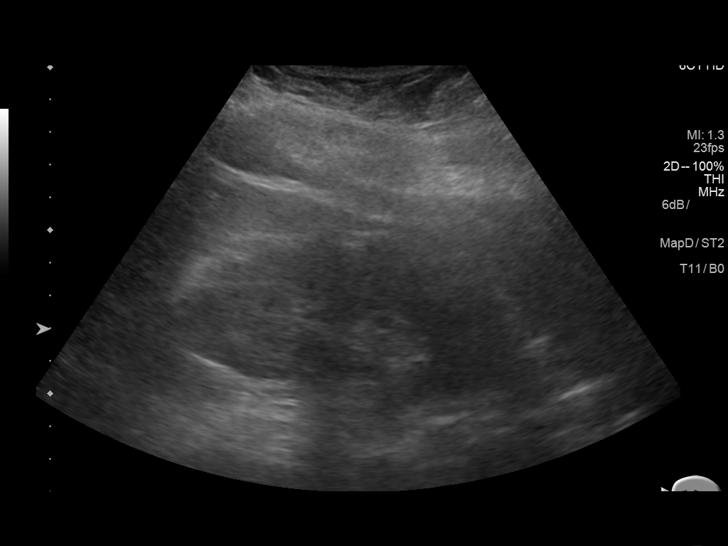
[im 17/24]
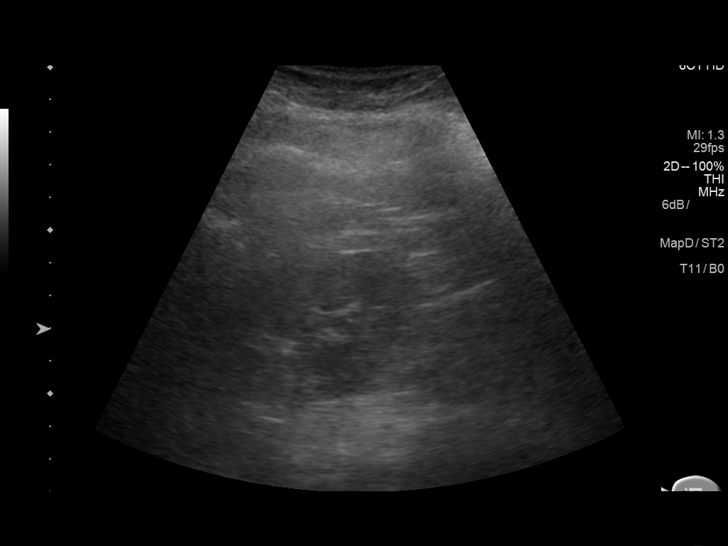
[im 19/24]
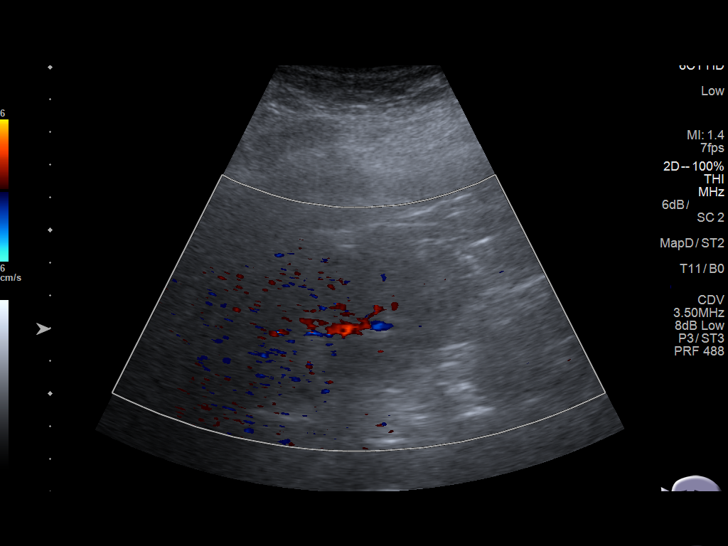
[im 20/24]
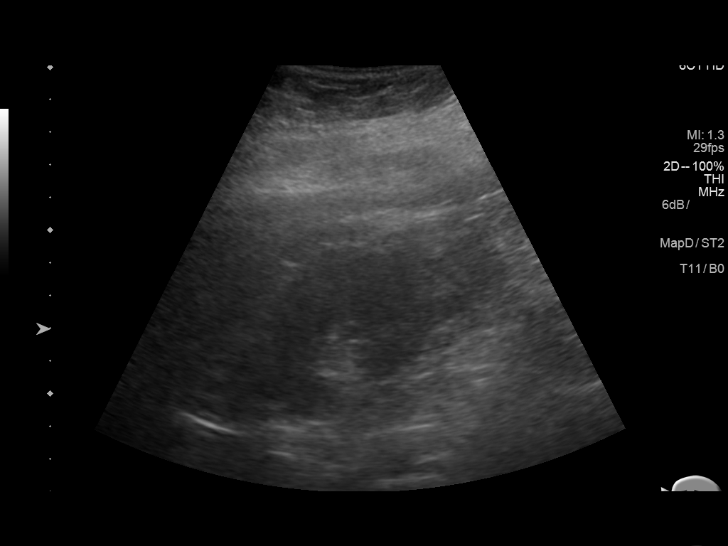
[im 22/24]
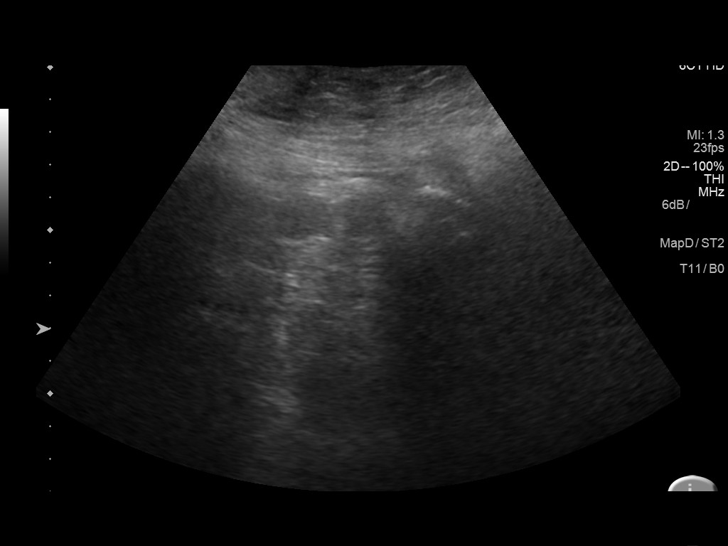
[im 24/24]
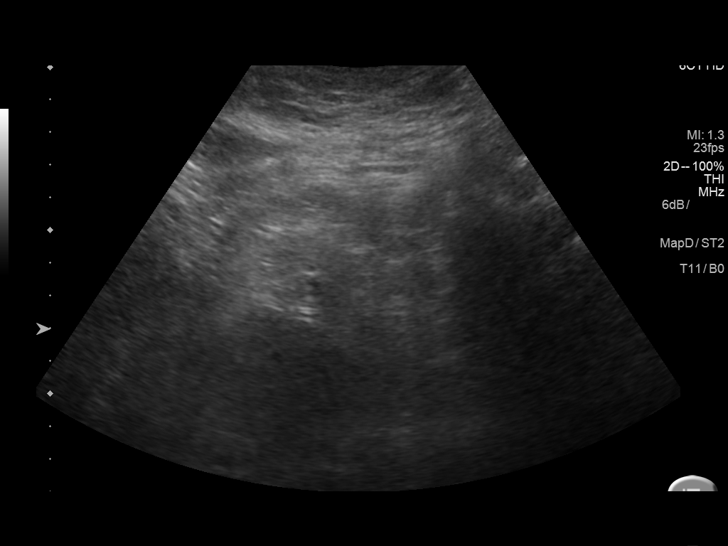

[14 of 24 positions shown; findings below may reference images not displayed]

FINDINGS: Right Kidney:

Length: 11.2 cm.  No mass or hydronephrosis.

Left Kidney:

Length: 10.7 cm.  No mass or hydronephrosis.

Bladder:

Poorly visualized due to underdistention.
IMPRESSION: No hydronephrosis.

## 2016-02-12 ENCOUNTER — Ambulatory Visit: Payer: Self-pay | Admitting: Internal Medicine

## 2016-02-14 ENCOUNTER — Other Ambulatory Visit: Payer: Self-pay | Admitting: Nurse Practitioner

## 2016-02-24 ENCOUNTER — Encounter: Payer: Self-pay | Admitting: Pharmacist

## 2016-02-24 ENCOUNTER — Ambulatory Visit: Payer: Self-pay | Admitting: Pharmacist

## 2016-02-24 VITALS — BP 110/78 | Wt 204.0 lb

## 2016-02-24 DIAGNOSIS — Z79899 Other long term (current) drug therapy: Secondary | ICD-10-CM

## 2016-02-24 NOTE — Progress Notes (Signed)
[facility] Visit Note  Patient: Emily [name] MRN: [id number] Date of Birth: June 19, 1960 PCP: Boyce Medici, FNP   [name] 56 y.o. female presents for an annual medication review visit today. She was recently hospitalized for bronchitis and has now quit smoking.  BP 110/78 (BP Location: Right Arm, Patient Position: Sitting, Cuff Size: Large)   Wt 204 lb (92.5 kg)   BMI 39.84 kg/m   Patient Information   Past Medical History:  Diagnosis Date  . Diabetes mellitus without complication ([place])   . Hypertension   . Thyroid disease      History reviewed. No pertinent surgical history.   Family History  Problem Relation Age of Onset  . Diabetes Mother   . Hypertension Mother   . Diabetes Father   . Hypertension Father   . Diabetes Sister     New Diagnoses (since last visit):   Family Support: Good  Lifestyle Diet: Breakfast: eggs, sausage, ham Lunch: skips Dinner: pork chops, peas, corn, salad Drinks: flavored water, diet Sunkist    Current Exercise Habits: The patient does not participate in regular exercise at present  Exercise limited by: None identified    History  Alcohol Use No      History  Smoking Status  . Former Smoker  . Packs/day: 1.50  . Types: Cigarettes  . Quit date: 01/13/2016  Smokeless Tobacco  . Never Used      Health Maintenance  Topic Date Due  . Hepatitis C Screening  08/30/1960  . PNEUMOCOCCAL POLYSACCHARIDE VACCINE (1) 11/24/1960  . FOOT EXAM  11/24/1968  . OPHTHALMOLOGY EXAM  11/24/1968  . HIV Screening  11/24/1973  . TETANUS/TDAP  11/24/1977  . PAP SMEAR  11/25/1979  . MAMMOGRAM  11/24/2008  . COLONOSCOPY  11/24/2008  . INFLUENZA VACCINE  08/06/2015  . HEMOGLOBIN A1C  07/13/2016     Outpatient Encounter Prescriptions as of 02/24/2016  Medication Sig  . albuterol (PROVENTIL HFA;VENTOLIN HFA) 108 (90 Base) MCG/ACT inhaler Inhale 2 puffs into the lungs every 6 (six) hours as needed for  wheezing or shortness of breath.  Marland Kitchen amLODipine (NORVASC) 5 MG tablet TAKE ONE TABLET BY MOUTH EVERY DAY  . aspirin 81 MG tablet Take 81 mg by mouth daily.  Marland Kitchen atorvastatin (LIPITOR) 40 MG tablet Take 40 mg by mouth every evening.  . carvedilol (COREG) 25 MG tablet TAKE ONE TABLET BY MOUTH 2 TIMES A DAY  . cyclobenzaprine (FLEXERIL) 5 MG tablet TAKE ONE TABLET BY MOUTH EVERY DAY AS NEEDED FOR MUSCLE SPASMS  . glipiZIDE (GLUCOTROL) 10 MG tablet Take 10 mg by mouth 2 (two) times daily before a meal.  . hydrochlorothiazide (HYDRODIURIL) 25 MG tablet Take 1 tablet (25 mg total) by mouth daily.  . Insulin Glargine (LANTUS SOLOSTAR) 100 UNIT/ML Solostar Pen Inject 45 Units into the skin 2 (two) times daily.  Marland Kitchen lisinopril (PRINIVIL,ZESTRIL) 40 MG tablet Take 1 tablet by mouth daily.  . methimazole (TAPAZOLE) 10 MG tablet Take 1 tablet (10 mg total) by mouth 2 (two) times daily.  . potassium chloride (KLOR-CON) 20 MEQ packet Take 20 mEq by mouth daily.  . sitaGLIPtin (JANUVIA) 50 MG tablet Take 50 mg by mouth daily.  . Tiotropium Bromide Monohydrate (SPIRIVA RESPIMAT) 1.25 MCG/ACT AERS Inhale 2 puffs into the lungs 2 (two) times daily.  . CHANTIX 1 MG tablet TAKE 1/2 TABLET DAILY FOR 3 DAYS; THEN 1/2 TABLET TWICE DAILY TIMES 4DAYS; THEN 1 TABLET TWICE DAILY THEREAFTER. STOP SMOKING ON DAY 4. (  Patient not taking: Reported on 02/24/2016)  . liraglutide 18 MG/3ML SOPN Inject 0.3 mLs (1.8 mg total) into the skin every morning. (Patient not taking: Reported on 01/30/2016)  . tiotropium (SPIRIVA HANDIHALER) 18 MCG inhalation capsule Place 1 capsule (18 mcg total) into inhaler and inhale daily. (Patient not taking: Reported on 02/24/2016)  . [DISCONTINUED] atorvastatin (LIPITOR) 10 MG tablet Take 10 mg by mouth daily.   . [DISCONTINUED] azithromycin (ZITHROMAX) 250 MG tablet One tab daily   No facility-administered encounter medications on file as of 02/24/2016.    Assessment and Plan: Compliance:  States she  takes her medications as prescribed. She uses a pill box at home.  Thyroid Disorder:  TSH = 2.840 (01/14/16). Hx of thyrotoxicosis and goiter. Diagnosed with hyperthyroidism in ~2014 per clinic note in [facility].  Patient saw Dr. Cruzita Lederer in [facility] 11/07/15.   Methimazole restarted 01/30/16 from [facility]; she picked up the methimazole 01/31/16, but stated she was not aware that she was to restart this medication.  Patient was counseled at today's visit to restart this medication.  Diabetes:  A1c = 10.3% (01/14/16).  Checks blood sugar daily in the morning 162 today, 258 yesterday. Ranges: 116-335 mg/dl.  Lantus dose increased on January 26th to 45 units twice daily. Also on glucotrol 10 mg twice daily and Januvia 50 mg daily.  Patient has not started the Vicotoza; she was apprehensive about the injection. Now knows the Victoza injection is similar to [employer], no vial/syringe.  Admits to snacking on cheese doodles or chips. Meals vary as to what her mother cooks and prepares.  Encouraged patient to start exercising 5-10 minutes/day 2-3 days per week as tolerated with pain. [facility] will restart PAP for Victoza. Hypertension:  BP within goal of less than 130/80 mmHg. Currently on amlodipine, carvedilol and lisinopril. Cholesterol:  No recent lipid profile in [facility]. Currently on atorvastatin 40 mg daily. COPD:  No recent Ventolin inhaler use. Currently using Spiriva Respimat 1.25 mcg sample- 2 puffs twice daily. Spiriva capsules are on order through patient assistance. She quit smoking post hospitalization 01/13/16. Muscle Spasms/Pain:  Lower back pain with spasms. Cyclobenazprine helps with the pain. However, she is not able to stand more than 10 minutes at a time without having pain.  No pain noted with sitting today.  If pain continues in her legs, she was encouraged to call [facility] for an appointment. May benefit from gabapentin if pain is coming from diabetic neuropathy. Smoking  Cessation:  Patient has quit smoking post discharge from hospital 01/2016. Will hold off on ordering Chantix.   Patient will follow up with [facility] on 04/30/16. Recommend A1c and Lipid panel. Patient will follow up with Dr. Cruzita Lederer for her thyroid on 05/06/16. Patient will follow up with the pharmacist on 05/25/16.  Aryahna Spagna K. Dicky Doe, PharmD [facility] [place] Operations Coordinator [phone]

## 2016-02-24 NOTE — Patient Instructions (Addendum)
Use the Spiriva Respimat (1.25 mcg) until Spiriva Capsules available  Restart methimazole 10 mg twice daily for the thyroid.  Start exercising 5-10 minutes per day 2-3 times per week as tolerated with muscle spasms.  If pain continues in legs, consider asking [facility] to see you earlier than your appointment in April. This may be diabetic neuropathy as you are experiencing tingling in your hands and feet.

## 2016-02-25 ENCOUNTER — Telehealth: Payer: Self-pay

## 2016-02-25 NOTE — Telephone Encounter (Signed)
Received PAP application from [facility] for Spiriva and Novolog placed for provider to sign.

## 2016-02-25 NOTE — Telephone Encounter (Signed)
Received PAP application from [facility] for Chantix, Lantus placed for provider to sign.

## 2016-02-27 NOTE — Telephone Encounter (Signed)
Placed signed application/script in [facility] folder for pickup. 

## 2016-03-06 ENCOUNTER — Telehealth: Payer: Self-pay | Admitting: Pharmacist

## 2016-03-06 NOTE — Telephone Encounter (Signed)
Faxed [employer] refill request for Lantus Solostar pen Inject 45 units into the skin 2 times a day-DOSE CHANGE.

## 2016-03-10 ENCOUNTER — Telehealth: Payer: Self-pay | Admitting: Pharmacist

## 2016-03-10 NOTE — Telephone Encounter (Signed)
Faxing Spiriva application to [employer] for processing, also [employer]	[place] application (inject 20 units four times a day before meals and at bedtime) to [employer] for processing.

## 2016-03-12 ENCOUNTER — Other Ambulatory Visit: Payer: Self-pay | Admitting: Urology

## 2016-03-12 ENCOUNTER — Other Ambulatory Visit: Payer: Self-pay | Admitting: Internal Medicine

## 2016-03-20 ENCOUNTER — Telehealth: Payer: Self-pay | Admitting: Pharmacist

## 2016-03-20 NOTE — Telephone Encounter (Signed)
Called [employer] for refill on Januvia 50mg  Take one tablet daily.

## 2016-04-17 ENCOUNTER — Other Ambulatory Visit: Payer: Self-pay | Admitting: Adult Health Nurse Practitioner

## 2016-04-17 ENCOUNTER — Other Ambulatory Visit: Payer: Self-pay | Admitting: Urology

## 2016-04-17 DIAGNOSIS — I152 Hypertension secondary to endocrine disorders: Secondary | ICD-10-CM

## 2016-04-21 ENCOUNTER — Other Ambulatory Visit: Payer: Self-pay | Admitting: Internal Medicine

## 2016-04-21 ENCOUNTER — Other Ambulatory Visit: Payer: Self-pay | Admitting: Urology

## 2016-04-21 MED ORDER — VARENICLINE TARTRATE 1 MG PO TABS: ORAL_TABLET | ORAL | 0 refills | Status: DC

## 2016-04-27 ENCOUNTER — Observation Stay
Admission: EM | Admit: 2016-04-27 | Discharge: 2016-04-28 | Disposition: A | Discharge status: Home / Self Care | Payer: Medicaid Other | Attending: Internal Medicine | Admitting: Internal Medicine

## 2016-04-27 ENCOUNTER — Encounter: Payer: Self-pay | Admitting: Emergency Medicine

## 2016-04-27 ENCOUNTER — Emergency Department: Payer: Medicaid Other

## 2016-04-27 DIAGNOSIS — R112 Nausea with vomiting, unspecified: Secondary | ICD-10-CM | POA: Diagnosis not present

## 2016-04-27 DIAGNOSIS — N183 Chronic kidney disease, stage 3 (moderate): Secondary | ICD-10-CM | POA: Insufficient documentation

## 2016-04-27 DIAGNOSIS — R55 Syncope and collapse: Secondary | ICD-10-CM | POA: Insufficient documentation

## 2016-04-27 DIAGNOSIS — R202 Paresthesia of skin: Secondary | ICD-10-CM | POA: Diagnosis present

## 2016-04-27 DIAGNOSIS — E1122 Type 2 diabetes mellitus with diabetic chronic kidney disease: Secondary | ICD-10-CM | POA: Diagnosis not present

## 2016-04-27 DIAGNOSIS — E669 Obesity, unspecified: Secondary | ICD-10-CM | POA: Diagnosis not present

## 2016-04-27 DIAGNOSIS — Z7951 Long term (current) use of inhaled steroids: Secondary | ICD-10-CM | POA: Insufficient documentation

## 2016-04-27 DIAGNOSIS — N179 Acute kidney failure, unspecified: Secondary | ICD-10-CM | POA: Diagnosis not present

## 2016-04-27 DIAGNOSIS — R200 Anesthesia of skin: Secondary | ICD-10-CM | POA: Diagnosis present

## 2016-04-27 DIAGNOSIS — Z794 Long term (current) use of insulin: Secondary | ICD-10-CM | POA: Insufficient documentation

## 2016-04-27 DIAGNOSIS — Z7982 Long term (current) use of aspirin: Secondary | ICD-10-CM | POA: Diagnosis not present

## 2016-04-27 DIAGNOSIS — Z79899 Other long term (current) drug therapy: Secondary | ICD-10-CM | POA: Insufficient documentation

## 2016-04-27 DIAGNOSIS — E0590 Thyrotoxicosis, unspecified without thyrotoxic crisis or storm: Secondary | ICD-10-CM | POA: Diagnosis not present

## 2016-04-27 DIAGNOSIS — Z87891 Personal history of nicotine dependence: Secondary | ICD-10-CM | POA: Insufficient documentation

## 2016-04-27 DIAGNOSIS — H539 Unspecified visual disturbance: Secondary | ICD-10-CM

## 2016-04-27 DIAGNOSIS — E039 Hypothyroidism, unspecified: Secondary | ICD-10-CM | POA: Diagnosis not present

## 2016-04-27 DIAGNOSIS — I129 Hypertensive chronic kidney disease with stage 1 through stage 4 chronic kidney disease, or unspecified chronic kidney disease: Secondary | ICD-10-CM | POA: Diagnosis not present

## 2016-04-27 LAB — CBC
HEMATOCRIT: 43.9 % (ref 35.0–47.0)
HEMOGLOBIN: 14.5 g/dL (ref 12.0–16.0)
MCH: 29.2 pg (ref 26.0–34.0)
MCHC: 33.1 g/dL (ref 32.0–36.0)
MCV: 88.4 fL (ref 80.0–100.0)
Platelets: 167 10*3/uL (ref 150–440)
RBC: 4.97 MIL/uL (ref 3.80–5.20)
RDW: 13.6 % (ref 11.5–14.5)
WBC: 5.1 10*3/uL (ref 3.6–11.0)

## 2016-04-27 LAB — URINALYSIS, COMPLETE (UACMP) WITH MICROSCOPIC
BACTERIA UA: NONE SEEN
BILIRUBIN URINE: NEGATIVE
Glucose, UA: 50 mg/dL — AB
Ketones, ur: NEGATIVE mg/dL
LEUKOCYTES UA: NEGATIVE
NITRITE: NEGATIVE
PROTEIN: 100 mg/dL — AB
Specific Gravity, Urine: 1.014 (ref 1.005–1.030)
pH: 5 (ref 5.0–8.0)

## 2016-04-27 LAB — GLUCOSE, CAPILLARY
Glucose-Capillary: 188 mg/dL — ABNORMAL HIGH (ref 65–99)
Glucose-Capillary: 252 mg/dL — ABNORMAL HIGH (ref 65–99)

## 2016-04-27 LAB — BASIC METABOLIC PANEL
ANION GAP: 5 (ref 5–15)
BUN: 26 mg/dL — ABNORMAL HIGH (ref 6–20)
CO2: 28 mmol/L (ref 22–32)
Calcium: 9 mg/dL (ref 8.9–10.3)
Chloride: 103 mmol/L (ref 101–111)
Creatinine, Ser: 1.54 mg/dL — ABNORMAL HIGH (ref 0.44–1.00)
GFR calc Af Amer: 42 mL/min — ABNORMAL LOW (ref >60)
GFR, EST NON AFRICAN AMERICAN: 36 mL/min — AB (ref >60)
GLUCOSE: 190 mg/dL — AB (ref 65–99)
POTASSIUM: 3.9 mmol/L (ref 3.5–5.1)
Sodium: 136 mmol/L (ref 135–145)

## 2016-04-27 MED ORDER — ASPIRIN 300 MG RE SUPP: 300.0000 mg | Freq: Every day | RECTAL | Status: DC

## 2016-04-27 MED ORDER — METHIMAZOLE 10 MG PO TABS
10.0000 mg | ORAL_TABLET | Freq: Two times a day (BID) | ORAL | Status: DC
  Administered 2016-04-27 – 2016-04-28 (×2): 10 mg via ORAL
  Filled 2016-04-27 (×3): qty 1

## 2016-04-27 MED ORDER — ENOXAPARIN SODIUM 40 MG/0.4ML ~~LOC~~ SOLN
40.0000 mg | SUBCUTANEOUS | Status: DC
  Administered 2016-04-27: 22:00:00 40 mg via SUBCUTANEOUS
  Filled 2016-04-27: qty 0.4

## 2016-04-27 MED ORDER — CYCLOBENZAPRINE HCL 10 MG PO TABS: 5.0000 mg | ORAL_TABLET | Freq: Three times a day (TID) | ORAL | Status: DC | PRN

## 2016-04-27 MED ORDER — CARVEDILOL 25 MG PO TABS: 25.0000 mg | ORAL_TABLET | Freq: Two times a day (BID) | ORAL | Status: DC

## 2016-04-27 MED ORDER — ASPIRIN 325 MG PO TABS
325.0000 mg | ORAL_TABLET | Freq: Every day | ORAL | Status: DC
  Administered 2016-04-28: 11:00:00 325 mg via ORAL
  Filled 2016-04-27: qty 1

## 2016-04-27 MED ORDER — STROKE: EARLY STAGES OF RECOVERY BOOK
Freq: Once | Status: AC
  Administered 2016-04-27: 22:00:00

## 2016-04-27 MED ORDER — SENNOSIDES-DOCUSATE SODIUM 8.6-50 MG PO TABS: 1.0000 | ORAL_TABLET | Freq: Every evening | ORAL | Status: DC | PRN

## 2016-04-27 MED ORDER — ACETAMINOPHEN 650 MG RE SUPP
650.0000 mg | RECTAL | Status: DC | PRN
Start: 2016-04-27 — End: 2016-04-28

## 2016-04-27 MED ORDER — SODIUM CHLORIDE 0.9 % IV SOLN
INTRAVENOUS | Status: DC
  Administered 2016-04-27: 22:00:00 via INTRAVENOUS

## 2016-04-27 MED ORDER — POTASSIUM CHLORIDE 20 MEQ PO PACK
20.0000 meq | PACK | Freq: Every day | ORAL | Status: DC
  Administered 2016-04-28: 20 meq via ORAL
  Filled 2016-04-27: qty 1

## 2016-04-27 MED ORDER — ALBUTEROL SULFATE (2.5 MG/3ML) 0.083% IN NEBU: 2.5000 mg | INHALATION_SOLUTION | Freq: Four times a day (QID) | RESPIRATORY_TRACT | Status: DC | PRN

## 2016-04-27 MED ORDER — PNEUMOCOCCAL VAC POLYVALENT 25 MCG/0.5ML IJ INJ: 0.5000 mL | INJECTION | INTRAMUSCULAR | Status: DC

## 2016-04-27 MED ORDER — AMLODIPINE BESYLATE 5 MG PO TABS: 5.0000 mg | ORAL_TABLET | Freq: Every day | ORAL | Status: DC

## 2016-04-27 MED ORDER — ACETAMINOPHEN 325 MG PO TABS
650.0000 mg | ORAL_TABLET | ORAL | Status: DC | PRN
  Administered 2016-04-28: 16:00:00 650 mg via ORAL
  Filled 2016-04-27: qty 2

## 2016-04-27 MED ORDER — INSULIN ASPART 100 UNIT/ML ~~LOC~~ SOLN
0.0000 [IU] | Freq: Every day | SUBCUTANEOUS | Status: DC
  Administered 2016-04-27: 3 [IU] via SUBCUTANEOUS
  Filled 2016-04-27: qty 3

## 2016-04-27 MED ORDER — INSULIN ASPART 100 UNIT/ML ~~LOC~~ SOLN
0.0000 [IU] | Freq: Three times a day (TID) | SUBCUTANEOUS | Status: DC
  Administered 2016-04-28 (×2): 2 [IU] via SUBCUTANEOUS
  Filled 2016-04-27 (×2): qty 2

## 2016-04-27 MED ORDER — ASPIRIN 81 MG PO CHEW
324.0000 mg | CHEWABLE_TABLET | Freq: Once | ORAL | Status: AC
  Administered 2016-04-27: 324 mg via ORAL
  Filled 2016-04-27 (×2): qty 4

## 2016-04-27 MED ORDER — INSULIN GLARGINE 100 UNIT/ML ~~LOC~~ SOLN
45.0000 [IU] | Freq: Two times a day (BID) | SUBCUTANEOUS | Status: DC
  Administered 2016-04-27 – 2016-04-28 (×2): 45 [IU] via SUBCUTANEOUS
  Filled 2016-04-27 (×4): qty 0.45

## 2016-04-27 MED ORDER — ATORVASTATIN CALCIUM 20 MG PO TABS
40.0000 mg | ORAL_TABLET | Freq: Every day | ORAL | Status: DC
  Administered 2016-04-27 – 2016-04-28 (×2): 40 mg via ORAL
  Filled 2016-04-27 (×2): qty 2

## 2016-04-27 MED ORDER — TIOTROPIUM BROMIDE MONOHYDRATE 18 MCG IN CAPS
1.0000 | ORAL_CAPSULE | Freq: Every day | RESPIRATORY_TRACT | Status: DC
  Administered 2016-04-27 – 2016-04-28 (×2): 18 ug via RESPIRATORY_TRACT
  Filled 2016-04-27: qty 5

## 2016-04-27 MED ORDER — ACETAMINOPHEN 160 MG/5ML PO SOLN: 650.0000 mg | ORAL | Status: DC | PRN

## 2016-04-27 NOTE — ED Notes (Signed)
primedoc with pt. 

## 2016-04-27 NOTE — ED Notes (Signed)
Eating dinner tray  °

## 2016-04-27 NOTE — ED Notes (Signed)
Pt alert.  Speech clear.  Pt watching tv   Family with pt

## 2016-04-27 NOTE — ED Notes (Signed)
Report  Called to [facility].

## 2016-04-27 NOTE — H&P (Addendum)
[place] [facility] [place] NAME: Emily [name]    MR#:  [id number]  DATE OF BIRTH:  07/05/1960  DATE OF ADMISSION:  04/27/2016  PRIMARY CARE PHYSICIAN: Boyce Medici, FNP   REQUESTING/REFERRING PHYSICIAN: Schuyler Amor, MD  CHIEF COMPLAINT:   Chief Complaint  Patient presents with  . Vision changes   Left eye vision changes and left-sided tingling 4 days. HISTORY OF PRESENT ILLNESS:  Emily [name]  is a 56 y.o. female with a known history of Hypertension, diabetes and thyroid disease. The patient presents to the ED with the above chief complaints. She said she has had poor vision and double vision of left eye since Friday. She also complains of headache, left arm and leg tingling. But she denies any focal weakness, syncope or loss of consciousness. She denies any slurred speech, dysphagia or incontinence. 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 But she doesn't know she had stroke before.  PAST MEDICAL HISTORY:   Past Medical History:  Diagnosis Date  . Diabetes mellitus without complication ([place])   . Hypertension   . Thyroid disease     PAST SURGICAL HISTORY:  No past surgical history on file.  SOCIAL HISTORY:   Social History  Substance Use Topics  . Smoking status: Former Smoker    Packs/day: 1.50    Types: Cigarettes    Quit date: 01/13/2016  . Smokeless tobacco: Never Used  . Alcohol use No    FAMILY HISTORY:   Family History  Problem Relation Age of Onset  . Diabetes Mother   . Hypertension Mother   . Diabetes Father   . Hypertension Father   . Diabetes Sister     DRUG ALLERGIES:  No Known Allergies  REVIEW OF SYSTEMS:   Review of Systems  Constitutional: Negative for chills, fever, malaise/fatigue and weight loss.  HENT: Negative for congestion.   Eyes: Positive for blurred vision and double vision.  Respiratory: Negative for cough, shortness of breath, wheezing and  stridor.   Cardiovascular: Negative for chest pain, palpitations and leg swelling.  Gastrointestinal: Negative for abdominal pain, blood in stool, constipation, diarrhea, melena, nausea and vomiting.  Genitourinary: Negative for dysuria and frequency.  Musculoskeletal: Negative for back pain.  Skin: Negative for itching and rash.  Neurological: Positive for dizziness, tingling, sensory change and headaches. Negative for tremors, speech change, focal weakness, seizures, loss of consciousness and weakness.  Endo/Heme/Allergies: Does not bruise/bleed easily.  Psychiatric/Behavioral: Negative for depression. The patient is not nervous/anxious.     MEDICATIONS AT HOME:   Prior to Admission medications   Medication Sig Start Date End Date Taking? Authorizing Provider  albuterol (PROVENTIL HFA;VENTOLIN HFA) 108 (90 Base) MCG/ACT inhaler Inhale 2 puffs into the lungs every 6 (six) hours as needed for wheezing or shortness of breath. 01/16/16  Yes Epifanio Lesches, MD  amLODipine (NORVASC) 5 MG tablet TAKE ONE TABLET BY MOUTH EVERY DAY 04/20/16  Yes Teah Doles-Johnson, NP  aspirin 81 MG tablet Take 81 mg by mouth daily.   Yes Historical Provider, MD  atorvastatin (LIPITOR) 40 MG tablet TAKE ONE TABLET BY MOUTH EVERY DAY 04/21/16  Yes Tawni Millers, MD  carvedilol (COREG) 25 MG tablet TAKE ONE TABLET BY MOUTH 2 TIMES A DAY 03/12/16  Yes Shannon A McGowan, PA-C  cyclobenzaprine (FLEXERIL) 5 MG tablet TAKE ONE TABLET BY MOUTH EVERY DAY AS NEEDED FOR MUSCLE SPASMS 04/20/16  Yes Teah Doles-Johnson, NP  glipiZIDE (  GLUCOTROL) 10 MG tablet TAKE ONE TABLET BY MOUTH 2 TIMES A DAY 03/12/16  Yes Shannon A McGowan, PA-C  hydrochlorothiazide (HYDRODIURIL) 25 MG tablet Take 1 tablet by mouth daily. 03/12/16  Yes Shannon A McGowan, PA-C  Insulin Glargine (LANTUS SOLOSTAR) 100 UNIT/ML Solostar Pen Inject 45 Units into the skin 2 (two) times daily. 01/30/16  Yes Richard Maceo Pro., MD  lisinopril (PRINIVIL,ZESTRIL) 40 MG  tablet Take 1 tablet by mouth daily. 04/21/16  Yes Shannon A McGowan, PA-C  methimazole (TAPAZOLE) 10 MG tablet Take 1 tablet (10 mg total) by mouth 2 (two) times daily. 01/30/16  Yes Richard Maceo Pro., MD  potassium chloride (KLOR-CON) 20 MEQ packet Take 20 mEq by mouth daily. 01/30/16  Yes Richard Maceo Pro., MD  sitaGLIPtin (JANUVIA) 50 MG tablet Take 50 mg by mouth daily. 07/24/14  Yes Historical Provider, MD  Tiotropium Bromide Monohydrate (SPIRIVA RESPIMAT) 1.25 MCG/ACT AERS Inhale 2 puffs into the lungs 2 (two) times daily.   Yes Historical Provider, MD  liraglutide 18 MG/3ML SOPN Inject 0.3 mLs (1.8 mg total) into the skin every morning. Patient not taking: Reported on 01/30/2016 10/22/15   Philomena Doheny, MD  tiotropium (SPIRIVA HANDIHALER) 18 MCG inhalation capsule Place 1 capsule (18 mcg total) into inhaler and inhale daily. Patient not taking: Reported on 02/24/2016 01/16/16   Epifanio Lesches, MD  varenicline (CHANTIX) 1 MG tablet TAKE 1/2 TABLET DAILY FOR 3 DAYS; THEN 1/2 TABLET TWICE DAILY TIMES 4DAYS; THEN 1 TABLET TWICE DAILY THEREAFTER. STOP SMOKING ON DAY 4. 04/21/16   Nori Riis, PA-C      VITAL SIGNS:  Blood pressure 114/79, pulse 66, temperature 98.2 F (36.8 C), temperature source Oral, resp. rate 18, height 5\' 3"  (1.6 m), weight 200 lb (90.7 kg), SpO2 95 %.  PHYSICAL EXAMINATION:  Physical Exam  GENERAL:  56 y.o.-year-old patient lying in the bed with no acute distress. Obese. EYES: Pupils equal, round, reactive to light and accommodation. No scleral icterus. Extraocular muscles intact.  HEENT: Head atraumatic, normocephalic. Oropharynx and nasopharynx clear.  NECK:  Supple, no jugular venous distention. No thyroid enlargement, no tenderness.  LUNGS: Normal breath sounds bilaterally, no wheezing, rales,rhonchi or crepitation. No use of accessory muscles of respiration.  CARDIOVASCULAR: S1, S2 normal. No murmurs, rubs, or gallops.  ABDOMEN: Soft, nontender,  nondistended. Bowel sounds present. No organomegaly or mass.  EXTREMITIES: No pedal edema, cyanosis, or clubbing.  NEUROLOGIC: Cranial nerves II through XII are intact. Muscle strength 5/5 in all extremities. Sensation intact. Gait not checked.  PSYCHIATRIC: The patient is alert and oriented x 3.  SKIN: No obvious rash, lesion, or ulcer.   LABORATORY PANEL:   CBC  Recent Labs Lab 04/27/16 1156  WBC 5.1  HGB 14.5  HCT 43.9  PLT 167   ------------------------------------------------------------------------------------------------------------------  Chemistries   Recent Labs Lab 04/27/16 1156  NA 136  K 3.9  CL 103  CO2 28  GLUCOSE 190*  BUN 26*  CREATININE 1.54*  CALCIUM 9.0   ------------------------------------------------------------------------------------------------------------------  Cardiac Enzymes No results for input(s): TROPONINI in the last 168 hours. ------------------------------------------------------------------------------------------------------------------  RADIOLOGY:  Ct Head Wo Contrast  Result Date: 04/27/2016 CLINICAL DATA:  Blurry vision.  Headache. EXAM: CT HEAD WITHOUT CONTRAST TECHNIQUE: Contiguous axial images were obtained from the base of the skull through the vertex without intravenous contrast. COMPARISON:  CT scan of June 13, 2012. 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 No mass effect or midline shift is noted.  Ventricular size is within normal limits. There is no evidence of mass lesion, hemorrhage or acute infarction. Vascular: No hyperdense vessel or unexpected calcification. Skull: Normal. Negative for fracture or focal lesion. Sinuses/Orbits: No acute finding. Other: None. 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 No acute intracranial abnormality seen. Electronically Signed   By: Marijo Conception, M.D.   On: 04/27/2016 15:55      IMPRESSION AND PLAN:   Left eye vision changes and left side  tingling. Rule out CVA. The patient will be placed for observation. Continue aspirin 325 mg daily and Lipitor. Get MRI/MRA of brain, echocardiogram and carotid duplex. Neuro check, PT and OT evaluation.  Acute renal failure or CKD stage 3. Hold HCTZ and lisinopril, IV fluid support and follow-up BMP.  Hypertension. Hold HCTZ and lisinopril, continue Norvasc.  Diabetes. Hold by mouth diabetes medications, continue Lantus and start sliding scale.  Obesity. All the records are reviewed and case discussed with ED provider. Management plans discussed with the patient, Her mother and aunt and they are in agreement.  CODE STATUS: Full code  TOTAL TIME TAKING CARE OF THIS PATIENT: 53 minutes.    Demetrios Loll M.D on 04/27/2016 at 4:38 PM  Between 7am to 6pm - Pager - [phone]  After 6pm go to [email or web address] - Proofreader  [facility]  Office  [phone]  CC: Primary care physician; Boyce Medici, FNP   Note: This dictation was prepared with Dragon dictation along with smaller phrase technology. Any transcriptional errors that result from this process are unintentional.

## 2016-04-27 NOTE — ED Notes (Signed)
Patient transported to CT 

## 2016-04-27 NOTE — ED Triage Notes (Signed)
Pt reports that three days ago she began to have blurry vision that has not resolved. Pt reports that her eyes hurt. Pt reports a mild headache that comes and goes and intermittent tingling in both arms. Pt reports she is a diabetic and her blood sugar has been running high.

## 2016-04-27 NOTE — ED Notes (Signed)
Resumed care from [facility].    Family with pt.  Pt reports a headache and left eye pain. Sx for 3 days . No n/v/d.   Pt is diabetic and took bp meds this am.  Pt alert. Speech clear.

## 2016-04-27 NOTE — ED Provider Notes (Addendum)
[facility] Emergency Department Provider Note  ____________________________________________   I have reviewed the triage vital signs and the nursing notes.   HISTORY  Chief Complaint Vision changes    HPI Emily [name] is a 56 y.o. female history of diabetes and baseline elevated creatinine at around 1.6 a hypertension but no known history of CVA presents today complaining of tingling in her left arm and left leg and change in vision in her left eye since Friday. She denies any ongoing pain although she states she has had some minor headaches with this. She denies any trauma. She first noticed it on Friday morning when she was driving "all over the road". There was no trauma. Patientis a very poor historian. He denies any focal wea     Past Medical History:  Diagnosis Date  . Diabetes mellitus without complication ([place])   . Hypertension   . Thyroid disease     Patient Active Problem List   Diagnosis Date Noted  . Acute respiratory failure with hypoxia ([place]) 01/14/2016  . Muscle spasm of back 11/05/2015  . Hyperthyroidism 03/13/2015  . Goiter diffuse 03/13/2015  . Diabetes ([place]) 02/07/2015  . Essential hypertension 12/27/2014  . Chronic kidney disease 12/27/2014  . Tobacco abuse 12/27/2014    No past surgical history on file.  Prior to Admission medications   Medication Sig Start Date End Date Taking? Authorizing Provider  albuterol (PROVENTIL HFA;VENTOLIN HFA) 108 (90 Base) MCG/ACT inhaler Inhale 2 puffs into the lungs every 6 (six) hours as needed for wheezing or shortness of breath. 01/16/16  Yes Epifanio Lesches, MD  amLODipine (NORVASC) 5 MG tablet TAKE ONE TABLET BY MOUTH EVERY DAY 04/20/16  Yes Teah Doles-Johnson, NP  aspirin 81 MG tablet Take 81 mg by mouth daily.   Yes Historical Provider, MD  atorvastatin (LIPITOR) 40 MG tablet TAKE ONE TABLET BY MOUTH EVERY DAY 04/21/16  Yes Tawni Millers, MD  carvedilol (COREG) 25 MG tablet TAKE  ONE TABLET BY MOUTH 2 TIMES A DAY 03/12/16  Yes Shannon A McGowan, PA-C  cyclobenzaprine (FLEXERIL) 5 MG tablet TAKE ONE TABLET BY MOUTH EVERY DAY AS NEEDED FOR MUSCLE SPASMS 04/20/16  Yes Teah Doles-Johnson, NP  glipiZIDE (GLUCOTROL) 10 MG tablet TAKE ONE TABLET BY MOUTH 2 TIMES A DAY 03/12/16  Yes Shannon A McGowan, PA-C  hydrochlorothiazide (HYDRODIURIL) 25 MG tablet Take 1 tablet by mouth daily. 03/12/16  Yes Shannon A McGowan, PA-C  Insulin Glargine (LANTUS SOLOSTAR) 100 UNIT/ML Solostar Pen Inject 45 Units into the skin 2 (two) times daily. 01/30/16  Yes Richard Maceo Pro., MD  lisinopril (PRINIVIL,ZESTRIL) 40 MG tablet Take 1 tablet by mouth daily. 04/21/16  Yes Shannon A McGowan, PA-C  methimazole (TAPAZOLE) 10 MG tablet Take 1 tablet (10 mg total) by mouth 2 (two) times daily. 01/30/16  Yes Richard Maceo Pro., MD  potassium chloride (KLOR-CON) 20 MEQ packet Take 20 mEq by mouth daily. 01/30/16  Yes Richard Maceo Pro., MD  sitaGLIPtin (JANUVIA) 50 MG tablet Take 50 mg by mouth daily. 07/24/14  Yes Historical Provider, MD  Tiotropium Bromide Monohydrate (SPIRIVA RESPIMAT) 1.25 MCG/ACT AERS Inhale 2 puffs into the lungs 2 (two) times daily.   Yes Historical Provider, MD  liraglutide 18 MG/3ML SOPN Inject 0.3 mLs (1.8 mg total) into the skin every morning. Patient not taking: Reported on 01/30/2016 10/22/15   Philomena Doheny, MD  tiotropium (SPIRIVA HANDIHALER) 18 MCG inhalation capsule Place 1 capsule (18 mcg total) into inhaler  and inhale daily. Patient not taking: Reported on 02/24/2016 01/16/16   Epifanio Lesches, MD  varenicline (CHANTIX) 1 MG tablet TAKE 1/2 TABLET DAILY FOR 3 DAYS; THEN 1/2 TABLET TWICE DAILY TIMES 4DAYS; THEN 1 TABLET TWICE DAILY THEREAFTER. STOP SMOKING ON DAY 4. 04/21/16   Nori Riis, PA-C    Allergies Patient has no known allergies.  Family History  Problem Relation Age of Onset  . Diabetes Mother   . Hypertension Mother   . Diabetes Father   . Hypertension  Father   . Diabetes Sister     Social History Social History  Substance Use Topics  . Smoking status: Former Smoker    Packs/day: 1.50    Types: Cigarettes    Quit date: 01/13/2016  . Smokeless tobacco: Never Used  . Alcohol use No    Review of Systems Constitutional: No fever/chills Eyes: No visual changes. ENT: No sore throat. No stiff neck no neck pain Cardiovascular: Denies chest pain. Respiratory: Denies shortness of breath. Gastrointestinal:   no vomiting.  No diarrhea.  No constipation. Genitourinary: Negative for dysuria. Musculoskeletal: Negative lower extremity swelling Skin: Negative for rash. Neurological: Negative for severe headaches, focal weakness or numbness. 10-point ROS otherwise negative.  ____________________________________________   PHYSICAL EXAM:  VITAL SIGNS: ED Triage Vitals [04/27/16 1155]  Enc Vitals Group     BP 136/85     Pulse Rate 79     Resp 18     Temp 98.2 F (36.8 C)     Temp Source Oral     SpO2 94 %     Weight 200 lb (90.7 kg)     Height 5\' 3"  (1.6 m)     Head Circumference      Peak Flow      Pain Score 5     Pain Loc      Pain Edu?      Excl. in [place]?     Constitutional: Alert and oriented. Well appearing and in no acute distress. Eyes: Conjunctivae are normal. PERRL. EOMI. No tenderness to palp around the eye, nothing to suggest temporal arteritis.  Head: Atraumatic. Nose: No congestion/rhinnorhea. Mouth/Throat: Mucous membranes are moist.  Oropharynx non-erythematous. Neck: No stridor.   Nontender with no meningismus Cardiovascular: Normal rate, regular rhythm. Grossly normal heart sounds.  Good peripheral circulation. Respiratory: Normal respiratory effort.  No retractions. Lungs CTAB. Abdominal: Soft and nontender. No distention. No guarding no rebound Back:  There is no focal tenderness or step off.  there is no midline tenderness there are no lesions noted. there is no CVA tenderness Musculoskeletal: No lower  extremity tenderness, no upper extremity tenderness. No joint effusions, no DVT signs strong distal pulses no edema Neurologic:  Cranial nerves II through XII are grossly intact 5 out of 5 strength bilateral upper and lower extremity. Finger to nose within normal limits heel to shin within normal limits, speech is normal with no word finding difficulty or dysarthria, reflexes symmetric, pupils are equally round and reactive to light, there is no pronator drift, sensation is normal, vision is intact to confrontation, gait is deferred, there is no nystagmus, normal neurologic exam Skin:  Skin is warm, dry and intact. No rash noted. Psychiatric: Mood and affect are normal. Speech and behavior are normal.  ____________________________________________   LABS (all labs ordered are listed, but only abnormal results are displayed)  Labs Reviewed  BASIC METABOLIC PANEL - Abnormal; Notable for the following:       Result Value  Glucose, Bld 190 (*)    BUN 26 (*)    Creatinine, Ser 1.54 (*)    GFR calc non Af Amer 36 (*)    GFR calc Af Amer 42 (*)    All other components within normal limits  URINALYSIS, COMPLETE (UACMP) WITH MICROSCOPIC - Abnormal; Notable for the following:    Color, Urine YELLOW (*)    APPearance HAZY (*)    Glucose, UA 50 (*)    Hgb urine dipstick SMALL (*)    Protein, ur 100 (*)    Squamous Epithelial / LPF 0-5 (*)    All other components within normal limits  GLUCOSE, CAPILLARY - Abnormal; Notable for the following:    Glucose-Capillary 188 (*)    All other components within normal limits  CBC  CBG MONITORING, ED   ____________________________________________  EKG  I personally interpreted any EKGs ordered by me or triage Sinus arrhythmia rate 67 bpm no acute ST elevation or depression normal axis unremarkable EKG possible old infarct nonspecific ST changes  ______________________________________  RADIOLOGY  I reviewed any imaging ordered by me or triage that  were performed during my shift and, if possible, patient and/or family made aware of any abnormal findings. ____________________________________________   PROCEDURES  Procedure(s) performed: None  Procedures  Critical Care performed: None  ____________________________________________   INITIAL IMPRESSION / ASSESSMENT AND PLAN / ED COURSE  Pertinent labs & imaging results that were available during my care of the patient were reviewed by me and considered in my medical decision making (see chart for details).  Patient with normal visual acuity, complaining of intermittent difficulty seeing out of her left eye, occasional diplopia, as well as tingling in her left arm and left leg. This is more suggestive of a CVA that is primarily ocular issue. Nothing to suggest him for arteritis. 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 Patient will require further workup as an inpatient for this. My concern is that this is a CVA. Glaucoma does not seem likely given tingling in arms and legs, no pain to the eye, pupils reactive The patient is not a candidate for TPA given time of onset last Friday morning.   ____________________________________________   FINAL CLINICAL IMPRESSION(S) / ED DIAGNOSES  Final diagnoses:  None      This chart was dictated using voice recognition software.  Despite best efforts to proofread,  errors can occur which can change meaning.      Schuyler Amor, MD 04/27/16 Alondra Park, MD 04/27/16 [place], MD 04/27/16 [phone]

## 2016-04-28 ENCOUNTER — Observation Stay: Payer: Medicaid Other

## 2016-04-28 ENCOUNTER — Observation Stay (HOSPITAL_BASED_OUTPATIENT_CLINIC_OR_DEPARTMENT_OTHER)
Admit: 2016-04-28 | Discharge: 2016-04-28 | Disposition: A | Discharge status: Home / Self Care | Payer: Medicaid Other | Attending: Internal Medicine | Admitting: Internal Medicine

## 2016-04-28 DIAGNOSIS — G459 Transient cerebral ischemic attack, unspecified: Secondary | ICD-10-CM

## 2016-04-28 DIAGNOSIS — R200 Anesthesia of skin: Secondary | ICD-10-CM

## 2016-04-28 LAB — BASIC METABOLIC PANEL
Anion gap: 5 (ref 5–15)
BUN: 19 mg/dL (ref 6–20)
CHLORIDE: 103 mmol/L (ref 101–111)
CO2: 28 mmol/L (ref 22–32)
Calcium: 8.6 mg/dL — ABNORMAL LOW (ref 8.9–10.3)
Creatinine, Ser: 1.22 mg/dL — ABNORMAL HIGH (ref 0.44–1.00)
GFR calc Af Amer: 56 mL/min — ABNORMAL LOW (ref >60)
GFR calc non Af Amer: 48 mL/min — ABNORMAL LOW (ref >60)
GLUCOSE: 178 mg/dL — AB (ref 65–99)
Potassium: 3.5 mmol/L (ref 3.5–5.1)
Sodium: 136 mmol/L (ref 135–145)

## 2016-04-28 LAB — LIPID PANEL
CHOLESTEROL: 124 mg/dL (ref 0–200)
HDL: 35 mg/dL — ABNORMAL LOW (ref >40)
LDL Cholesterol: 57 mg/dL (ref 0–99)
Total CHOL/HDL Ratio: 3.5 RATIO
Triglycerides: 162 mg/dL — ABNORMAL HIGH (ref <150)
VLDL: 32 mg/dL (ref 0–40)

## 2016-04-28 LAB — ECHOCARDIOGRAM COMPLETE
HEIGHTINCHES: 63 in
WEIGHTICAEL: 3257.6 [oz_av]

## 2016-04-28 LAB — GLUCOSE, CAPILLARY
GLUCOSE-CAPILLARY: 154 mg/dL — AB (ref 65–99)
Glucose-Capillary: 158 mg/dL — ABNORMAL HIGH (ref 65–99)

## 2016-04-28 MED ORDER — KETOROLAC TROMETHAMINE 30 MG/ML IJ SOLN
30.0000 mg | INTRAMUSCULAR | Status: AC
  Administered 2016-04-28: 30 mg via INTRAVENOUS
  Filled 2016-04-28: qty 1

## 2016-04-28 MED ORDER — ONDANSETRON HCL 4 MG/2ML IJ SOLN: 4.0000 mg | Freq: Four times a day (QID) | INTRAMUSCULAR | Status: DC | PRN

## 2016-04-28 MED ORDER — ONDANSETRON HCL 4 MG/2ML IJ SOLN
INTRAMUSCULAR | Status: AC
  Administered 2016-04-28: 4 mg
  Filled 2016-04-28: qty 2

## 2016-04-28 MED ORDER — PNEUMOCOCCAL VAC POLYVALENT 25 MCG/0.5ML IJ INJ
0.5000 mL | INJECTION | Freq: Once | INTRAMUSCULAR | Status: AC
  Administered 2016-04-28: 0.5 mL via INTRAMUSCULAR
  Filled 2016-04-28: qty 0.5

## 2016-04-28 NOTE — Progress Notes (Signed)
PT Cancellation Note  Patient Details Name: Kinzlee Selvy MRN: [id number] DOB: January 21, 1960   Cancelled Treatment:    Reason Eval/Treat Not Completed: PT screened, no needs identified, will sign off.  CT of head and MRI of brain demonstrating no acute intracranial abnormality.  Pt observed ambulating in room without AD (pt observed being independent with bed mobility, transfers, and ambulation).  No loss of balance noted with ambulation and head turns R/L/up/down, increasing/decreasing speed, and turning and stopping.  B LE strength and sensation WFL.  No c/o tingling/sensation changes in LE's.  Pt reporting recent vision changes but had difficulty describing; vision changes did not appear to have impact on pt's ambulation or balance (pt reports no change in terms of balance, ambulation, or functional mobility from baseline).  Pt appears to be at baseline in terms of functional mobility and does not require PT eval.  PT screen completed.  Will sign off.  Nursing and CM notified.  Leitha Bleak, PT 04/28/16, 12:27 PM [phone]

## 2016-04-28 NOTE — Progress Notes (Signed)
Pt for discharge home. Alert. No resp distress. Cont with blu rriness of the eyse.  Enc to sit a minute before  Standing.  Instructions discussed with pt and family.   meds / diet activity and f/u  Discussed. Sl d/cd.  moniter d/cd.  Verbalized understanding of  D/c plans.

## 2016-04-28 NOTE — Progress Notes (Signed)
Patient vomitting and c/o of HA.  SBP in 120s.  Dr. Tressia Miners notified.  Toradol, zofran ordered.  Will hold BP meds this AM.

## 2016-04-28 NOTE — Progress Notes (Signed)
*  PRELIMINARY RESULTS* Echocardiogram 2D Echocardiogram has been performed.  Emily [name] 04/28/2016, 8:57 AM

## 2016-04-28 NOTE — Evaluation (Signed)
Occupational Therapy Evaluation Patient Details Name: Emily [name] MRN: [id number] DOB: [birth year] Today's Date: 04/28/2016    History of Present Illness 56 y.o. female with a known history of Hypertension, diabetes and thyroid disease. The patient presents to the ED with 4 day history of blurry vision and L sided numbness. She said she has had poor vision and double vision of left eye since Friday. She also complains of headache, left arm and leg tingling. Symptoms improved and she is close to baseline   Clinical Impression   Pt seen for OT evaluation this date. Pt reports numbness in LUE has complete subsided and sensation found to be intact with assessment. Pt able to perform all functional mobility and self care tasks as baseline independence with good confidence and no LOB noted. Pt reports blurry vision in L eye, no double vision reported, no functional self care deficits resulting from blurriness. Recommend OP OT to support visual deficits and address safety for driving.     Follow Up Recommendations  Outpatient OT    Equipment Recommendations  None recommended by OT    Recommendations for Other Services       Precautions / Restrictions Precautions Precautions: None Restrictions Weight Bearing Restrictions: No      Mobility Bed Mobility Overal bed mobility: Independent             General bed mobility comments: no LOB, good safety/confidence  Transfers Overall transfer level: Independent               General transfer comment: no LOB, good safety/confidence    Balance Overall balance assessment: Independent                                         ADL either performed or assessed with clinical judgement   ADL Overall ADL's : Independent;At baseline                                       General ADL Comments: pt able to perform all functional mobility and self care tasks at baseline independence with no LOB and  good safety/confidence     Vision   Eye Alignment: Within Functional Limits Ocular Range of Motion: Within Functional Limits Alignment/Gaze Preference: Within Defined Limits Tracking/Visual Pursuits: Able to track stimulus in all quads without difficulty Convergence: Within functional limits Additional Comments: no double vision reported     Perception     Praxis      Pertinent Vitals/Pain Pain Assessment: 0-10 Pain Score: 3  Pain Location: headache above L eye Pain Intervention(s): Limited activity within patient's tolerance;Monitored during session     Hand Dominance Right   Extremity/Trunk Assessment Upper Extremity Assessment Upper Extremity Assessment: Overall WFL for tasks assessed   Lower Extremity Assessment Lower Extremity Assessment: Overall WFL for tasks assessed   Cervical / Trunk Assessment Cervical / Trunk Assessment: Normal   Communication Communication Communication: No difficulties   Cognition Arousal/Alertness: Awake/alert Behavior During Therapy: WFL for tasks assessed/performed Overall Cognitive Status: Within Functional Limits for tasks assessed                                     General Comments       Exercises  Shoulder Instructions      Home Living Family/patient expects to be discharged to:: Private residence Living Arrangements: Parent Available Help at Discharge: Family;Available PRN/intermittently;Available 24 hours/day Type of Home: House Home Access: Stairs to enter [employer] of Steps: 4 Entrance Stairs-Rails: None Home Layout: One level     Bathroom Shower/Tub: Teacher, early years/pre: Standard Bathroom Accessibility: No   Home Equipment: Environmental consultant - 2 wheels;Shower seat   Additional Comments: has access to walker and shower chair but has not needed them       Prior Functioning/Environment Level of Independence: Independent        Comments: Pt denies any falls in past 12  months.        OT Problem List:        OT Treatment/Interventions:      OT Goals(Current goals can be found in the care plan section) Acute Rehab OT Goals Patient Stated Goal: go home OT Goal Formulation: With patient/family Time For Goal Achievement: 04/29/16 Potential to Achieve Goals: Good  OT Frequency:     Barriers to D/C:            Co-evaluation              End of Session    Activity Tolerance: Patient tolerated treatment well Patient left: in bed;with call bell/phone within reach;with nursing/sitter in room;with family/visitor present  OT Visit Diagnosis: Other abnormalities of gait and mobility (R26.89)                Time: 2229-7989 OT Time Calculation (min): 15 min Charges:  OT General Charges $OT Visit: 1 Procedure OT Evaluation $OT Eval Low Complexity: 1 Procedure G-Codes: OT G-codes **NOT FOR INPATIENT CLASS** Functional Assessment Tool Used: AM-PAC 6 Clicks Daily Activity;Clinical judgement Functional Limitation: Self care Self Care Current Status (Q1194): 0 percent impaired, limited or restricted Self Care Goal Status (R7408): 0 percent impaired, limited or restricted Self Care Discharge Status (X4481): 0 percent impaired, limited or restricted   Jeni Salles, MPH, MS, OTR/L ascom [phone] 04/28/16, 1:14 PM

## 2016-04-28 NOTE — Consult Note (Signed)
Reason for Consult:L side numbness and blurry vision x4 days Referring Physician: Dr. Tressia Miners   CC: L side numbness and blurry vision x4 days  HPI: Emily [name] is an 56 y.o. female with a known history of Hypertension, diabetes and thyroid disease. The patient presents to the ED with 4 day history of blurry vision and L sided numbness. She said she has had poor vision and double vision of left eye since Friday. She also complains of headache, left arm and leg tingling. Symptoms improved and she is close to baseline.  CTH no acute abnormalities    Past Medical History:  Diagnosis Date  . Diabetes mellitus without complication ([place])   . Hypertension   . Thyroid disease     History reviewed. No pertinent surgical history.  Family History  Problem Relation Age of Onset  . Diabetes Mother   . Hypertension Mother   . Diabetes Father   . Hypertension Father   . Diabetes Sister     Social History:  reports that she quit smoking about 3 months ago. Her smoking use included Cigarettes. She smoked 1.50 packs per day. She has never used smokeless tobacco. She reports that she does not drink alcohol or use drugs.  No Known Allergies  Medications: I have reviewed the patient's current medications.  ROS: History obtained from the patient  General ROS: negative for - chills, fatigue, fever, night sweats, weight gain or weight loss Psychological ROS: negative for - behavioral disorder, hallucinations, memory difficulties, mood swings or suicidal ideation Ophthalmic ROS: negative for - blurry vision, double vision, eye pain or loss of vision ENT ROS: negative for - epistaxis, nasal discharge, oral lesions, sore throat, tinnitus or vertigo Allergy and Immunology ROS: negative for - hives or itchy/watery eyes Hematological and Lymphatic ROS: negative for - bleeding problems, bruising or swollen lymph nodes Endocrine ROS: negative for - galactorrhea, hair pattern changes,  polydipsia/polyuria or temperature intolerance Respiratory ROS: negative for - cough, hemoptysis, shortness of breath or wheezing Cardiovascular ROS: negative for - chest pain, dyspnea on exertion, edema or irregular heartbeat Gastrointestinal ROS: negative for - abdominal pain, diarrhea, hematemesis, nausea/vomiting or stool incontinence Genito-Urinary ROS: negative for - dysuria, hematuria, incontinence or urinary frequency/urgency Musculoskeletal ROS: negative for - joint swelling or muscular weakness Neurological ROS: as noted in HPI Dermatological ROS: negative for rash and skin lesion changes  Physical Examination: Blood pressure 127/74, pulse 72, temperature 97.8 F (36.6 C), temperature source Oral, resp. rate 18, height 5\' 3"  (1.6 m), weight 92.4 kg (203 lb 9.6 oz), SpO2 91 %.   Neurological Examination   Mental Status: Alert, oriented, thought content appropriate.  Speech fluent without evidence of aphasia.  Able to follow 3 step commands without difficulty. Cranial Nerves: II: Discs flat bilaterally; Visual fields grossly normal, pupils equal, round, reactive to light and accommodation III,IV, VI: ptosis not present, extra-ocular motions intact bilaterally V,VII: smile symmetric, facial light touch sensation normal bilaterally VIII: hearing normal bilaterally IX,X: gag reflex present XI: bilateral shoulder shrug XII: midline tongue extension Motor: Right : Upper extremity   5/5    Left:     Upper extremity   5/5  Lower extremity   5/5     Lower extremity   5/5 Tone and bulk:normal tone throughout; no atrophy noted Sensory: Pinprick and light touch intact throughout, bilaterally Deep Tendon Reflexes: 2+ and symmetric throughout Plantars: Right: downgoing   Left: downgoing Cerebellar: normal finger-to-nose, normal rapid alternating movements and normal heel-to-shin test Gait: not  tested       Laboratory Studies:   Basic Metabolic Panel:  Recent Labs Lab  04/27/16 1156 04/28/16 0708  NA 136 136  K 3.9 3.5  CL 103 103  CO2 28 28  GLUCOSE 190* 178*  BUN 26* 19  CREATININE 1.54* 1.22*  CALCIUM 9.0 8.6*    Liver Function Tests: No results for input(s): AST, ALT, ALKPHOS, BILITOT, PROT, ALBUMIN in the last 168 hours. No results for input(s): LIPASE, AMYLASE in the last 168 hours. No results for input(s): AMMONIA in the last 168 hours.  CBC:  Recent Labs Lab 04/27/16 1156  WBC 5.1  HGB 14.5  HCT 43.9  MCV 88.4  PLT 167    Cardiac Enzymes: No results for input(s): CKTOTAL, CKMB, CKMBINDEX, TROPONINI in the last 168 hours.  BNP: Invalid input(s): POCBNP  CBG:  Recent Labs Lab 04/27/16 1206 04/27/16 2143 04/28/16 0751  GLUCAP 188* 252* 154*    Microbiology: Results for orders placed or performed during the hospital encounter of 01/14/16  Rapid Influenza A&B Antigens ([facility] only)     Status: None   Collection Time: 01/14/16  1:40 PM  Result Value Ref Range Status   Influenza A ([facility]) NEGATIVE NEGATIVE Final   Influenza B ([facility]) NEGATIVE NEGATIVE Final    Coagulation Studies: No results for input(s): LABPROT, INR in the last 72 hours.  Urinalysis:  Recent Labs Lab 04/27/16 1156  COLORURINE YELLOW*  LABSPEC 1.014  PHURINE 5.0  GLUCOSEU 50*  HGBUR SMALL*  BILIRUBINUR NEGATIVE  KETONESUR NEGATIVE  PROTEINUR 100*  NITRITE NEGATIVE  LEUKOCYTESUR NEGATIVE    Lipid Panel:     Component Value Date/Time   CHOL 124 04/28/2016 0708   TRIG 162 (H) 04/28/2016 0708   HDL 35 (L) 04/28/2016 0708   CHOLHDL 3.5 04/28/2016 0708   VLDL 32 04/28/2016 0708   LDLCALC 57 04/28/2016 0708    HgbA1C:  Lab Results  Component Value Date   HGBA1C 10.3 (H) 01/14/2016    Urine Drug Screen:  No results found for: LABOPIA, COCAINSCRNUR, LABBENZ, AMPHETMU, THCU, LABBARB  Alcohol Level: No results for input(s): ETH in the last 168 hours.  Other results: EKG: normal EKG, normal sinus rhythm, unchanged from previous  tracings.  Imaging: Ct Head Wo Contrast  Result Date: 04/27/2016 CLINICAL DATA:  Blurry vision.  Headache. EXAM: CT HEAD WITHOUT CONTRAST TECHNIQUE: Contiguous axial images were obtained from the base of the skull through the vertex without intravenous contrast. COMPARISON:  CT scan of June 13, 2012. 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 No mass effect or midline shift is noted. Ventricular size is within normal limits. There is no evidence of mass lesion, hemorrhage or acute infarction. Vascular: No hyperdense vessel or unexpected calcification. Skull: Normal. Negative for fracture or focal lesion. Sinuses/Orbits: No acute finding. Other: None. 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 No acute intracranial abnormality seen. Electronically Signed   By: Marijo Conception, M.D.   On: 04/27/2016 15:55   Mr Brain Wo Contrast  Result Date: 04/28/2016 CLINICAL DATA:  Acute presentation with visual disturbance over the last 4 days. Headache. Tingling of the left arm and leg. EXAM: MRI HEAD WITHOUT CONTRAST MRA HEAD WITHOUT CONTRAST TECHNIQUE: Multiplanar, multiecho pulse sequences of the brain and surrounding structures were obtained without intravenous contrast. Angiographic images of the head were obtained using MRA technique without contrast. COMPARISON:  CT 04/27/2016 FINDINGS: MRI HEAD FINDINGS Brain: Diffusion imaging does not show any acute or subacute infarction.  The brainstem and cerebellum are normal. Cerebral hemispheres show old cortical and subcortical infarction in the right parietal region. Minimal small vessel changes seen affecting the basal ganglia and white matter. Old tiny right frontoparietal junction cortical insult. No mass lesion, hemorrhage, hydrocephalus or extra-axial collection. Vascular: Major vessels at the base of the brain show flow. Skull and upper cervical spine: Negative Sinuses/Orbits: Sinuses are clear. Orbits negative. Small amount of  fluid in left mastoid air cells. Other: None significant MRA HEAD FINDINGS Both internal carotid artery's are widely patent into the brain. No siphon stenosis. The anterior and middle cerebral vessels are patent without proximal stenosis, aneurysm or vascular malformation. There is mild distal vessel atherosclerotic irregularity. Both vertebral arteries are patent to the basilar. No basilar stenosis. Posterior circulation branch vessels are normal proximally. Mild distal vessel atherosclerotic irregularity. IMPRESSION: No acute or subacute infarction. 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 Old tiny right frontoparietal infarction. Minimal small vessel change of the basal ganglia and white matter. Negative MRA of the large and medium size vessels. The patient does have some distal vessel atherosclerotic change. Electronically Signed   By: Nelson Chimes M.D.   On: 04/28/2016 10:25   US Carotid Bilateral (at [facility] Only)  Result Date: 04/28/2016 CLINICAL DATA:  Left-sided numbness EXAM: BILATERAL CAROTID DUPLEX ULTRASOUND TECHNIQUE: Pearline Cables scale imaging, color Doppler and duplex ultrasound were performed of bilateral carotid and vertebral arteries in the neck. COMPARISON:  None. FINDINGS: Criteria: Quantification of carotid stenosis is based on velocity parameters that correlate the residual internal carotid diameter with NASCET-based stenosis levels, using the diameter of the distal internal carotid lumen as the denominator for stenosis measurement. The following velocity measurements were obtained: RIGHT ICA:  82 cm/sec CCA:  409 cm/sec SYSTOLIC ICA/CCA RATIO:  0.8 DIASTOLIC ICA/CCA RATIO:  0.9 ECA:  90 cm/sec LEFT ICA:  82 cm/sec CCA:  96 cm/sec SYSTOLIC ICA/CCA RATIO:  0.9 DIASTOLIC ICA/CCA RATIO:  1.4 ECA:  77 cm/sec RIGHT CAROTID ARTERY: Mild intimal thickening in the bulb. Low resistance internal carotid Doppler pattern. RIGHT VERTEBRAL ARTERY:  Antegrade. LEFT CAROTID ARTERY: Mild calcified plaque in the bulb.  Low resistance internal carotid Doppler pattern. LEFT VERTEBRAL ARTERY:  Antegrade. 1.5 cm nearly completely cystic benign-appearing lesion in the right thyroid gland. 1.5 cm posterior left mid thyroid nodule contains punctate calcifications. It is taller than wide. This nodule meets criteria for fine needle aspiration biopsy. IMPRESSION: Less than 50% stenosis in the internal carotid arteries. Benign appearing right thyroid cyst. Left mid thyroid nodule meets TI-Rads criteria for fine needle aspiration biopsy. Dedicated complete thyroid ultrasound is recommended. Electronically Signed   By: Marybelle Killings M.D.   On: 04/28/2016 09:43   Mr Jodene Nam Head/brain WJ Cm  Result Date: 04/28/2016 CLINICAL DATA:  Acute presentation with visual disturbance over the last 4 days. Headache. Tingling of the left arm and leg. EXAM: MRI HEAD WITHOUT CONTRAST MRA HEAD WITHOUT CONTRAST TECHNIQUE: Multiplanar, multiecho pulse sequences of the brain and surrounding structures were obtained without intravenous contrast. Angiographic images of the head were obtained using MRA technique without contrast. COMPARISON:  CT 04/27/2016 FINDINGS: MRI HEAD FINDINGS Brain: Diffusion imaging does not show any acute or subacute infarction. The brainstem and cerebellum are normal. Cerebral hemispheres show old cortical and subcortical infarction in the right parietal region. Minimal small vessel changes seen affecting the basal ganglia and white matter. Old tiny right frontoparietal junction cortical insult. No mass lesion, hemorrhage, hydrocephalus or extra-axial collection. Vascular: Major vessels at the  base of the brain show flow. Skull and upper cervical spine: Negative Sinuses/Orbits: Sinuses are clear. Orbits negative. Small amount of fluid in left mastoid air cells. Other: None significant MRA HEAD FINDINGS Both internal carotid artery's are widely patent into the brain. No siphon stenosis. The anterior and middle cerebral vessels are patent  without proximal stenosis, aneurysm or vascular malformation. There is mild distal vessel atherosclerotic irregularity. Both vertebral arteries are patent to the basilar. No basilar stenosis. Posterior circulation branch vessels are normal proximally. Mild distal vessel atherosclerotic irregularity. IMPRESSION: No acute or subacute infarction. 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 Old tiny right frontoparietal infarction. Minimal small vessel change of the basal ganglia and white matter. Negative MRA of the large and medium size vessels. The patient does have some distal vessel atherosclerotic change. Electronically Signed   By: Nelson Chimes M.D.   On: 04/28/2016 10:25     Assessment/Plan:  56 y.o. female with a known history of Hypertension, diabetes and thyroid disease. The patient presents to the ED with 4 day history of blurry vision and L sided numbness. She said she has had poor vision and double vision of left eye since Friday. She also complains of headache, left arm and leg tingling. Symptoms improved and she is close to baseline.  CTH no acute abnormalities    No acute abnormalities on MRI/MA Blurry vision comes and goes as per pt.  Anti platelet therapy and dc planning   Uri Covey  04/28/2016, 11:06 AM

## 2016-04-28 NOTE — Progress Notes (Signed)
[place] [facility] [place] NAME: Emily [name]    MR#:  [id number]  DATE OF BIRTH:  [birth year]  SUBJECTIVE:  CHIEF COMPLAINT:   Chief Complaint  Patient presents with  . Vision changes   - Admitted with left-sided numbness and tingling. Also has left eye double vision and headaches. -Complains of significant nausea and vomiting this morning  REVIEW OF SYSTEMS:  Review of Systems  Constitutional: Negative for chills, fever and malaise/fatigue.  HENT: Negative for congestion, ear discharge, hearing loss and nosebleeds.   Eyes: Negative for blurred vision and double vision.  Respiratory: Negative for cough, shortness of breath and wheezing.   Cardiovascular: Negative for chest pain, palpitations and leg swelling.  Gastrointestinal: Positive for nausea and vomiting. Negative for abdominal pain, constipation and diarrhea.  Genitourinary: Negative for dysuria.  Musculoskeletal: Negative for myalgias.  Neurological: Positive for headaches. Negative for dizziness, speech change, focal weakness and seizures.  Psychiatric/Behavioral: Negative for depression.    DRUG ALLERGIES:  No Known Allergies  VITALS:  Blood pressure 127/74, pulse 72, temperature 97.8 F (36.6 C), temperature source Oral, resp. rate 18, height 5\' 3"  (1.6 m), weight 92.4 kg (203 lb 9.6 oz), SpO2 91 %.  PHYSICAL EXAMINATION:  Physical Exam  GENERAL:  56 y.o.-year-old patient lying in the bed with no acute distress.  EYES: Pupils equal, round, reactive to light and accommodation. No scleral icterus. Extraocular muscles intact.  HEENT: Head atraumatic, normocephalic. Oropharynx and nasopharynx clear.  NECK:  Supple, no jugular venous distention. No thyroid enlargement, no tenderness.  LUNGS: Normal breath sounds bilaterally, no wheezing, rales,rhonchi or crepitation. No use of accessory muscles of respiration.  CARDIOVASCULAR: S1, S2 normal. No murmurs, rubs, or gallops.    ABDOMEN: Soft, nontender, nondistended. Bowel sounds present. No organomegaly or mass.  EXTREMITIES: No pedal edema, cyanosis, or clubbing.  NEUROLOGIC: Cranial nerves II through XII are intact. Muscle strength 5/5 in all extremities. Sensation intact. Gait not checked.  PSYCHIATRIC: The patient is alert and oriented x 3.  SKIN: No obvious rash, lesion, or ulcer.    LABORATORY PANEL:   CBC  Recent Labs Lab 04/27/16 1156  WBC 5.1  HGB 14.5  HCT 43.9  PLT 167   ------------------------------------------------------------------------------------------------------------------  Chemistries   Recent Labs Lab 04/27/16 1156  NA 136  K 3.9  CL 103  CO2 28  GLUCOSE 190*  BUN 26*  CREATININE 1.54*  CALCIUM 9.0   ------------------------------------------------------------------------------------------------------------------  Cardiac Enzymes No results for input(s): TROPONINI in the last 168 hours. ------------------------------------------------------------------------------------------------------------------  RADIOLOGY:  Ct Head Wo Contrast  Result Date: 04/27/2016 CLINICAL DATA:  Blurry vision.  Headache. EXAM: CT HEAD WITHOUT CONTRAST TECHNIQUE: Contiguous axial images were obtained from the base of the skull through the vertex without intravenous contrast. COMPARISON:  CT scan of June 13, 2012. 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 No mass effect or midline shift is noted. Ventricular size is within normal limits. There is no evidence of mass lesion, hemorrhage or acute infarction. Vascular: No hyperdense vessel or unexpected calcification. Skull: Normal. Negative for fracture or focal lesion. Sinuses/Orbits: No acute finding. Other: None. 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 No acute intracranial abnormality seen. Electronically Signed   By: Marijo Conception, M.D.   On: 04/27/2016 15:55    EKG:   Orders placed or performed  during the hospital encounter of 04/27/16  . ED EKG  . ED EKG  . EKG 12-Lead  . EKG  12-Lead    ASSESSMENT AND PLAN:   56 year old female with past medical history significant for hypertension, diabetes and hyperthyroidism presents for hospital secondary to headache, double vision and also the left-sided tingling and numbness.  #1 left-sided headache with left-sided tingling and numbness-could be migraine versus TIA. - Admitted, neuro checks. Symptoms have resolved. -MRI, MRA of the brain, neuro consult, echo and carotid Dopplers -On aspirin and statin -The skull therapy and occupational therapy consults requested.  #2 nausea-likely secondary to headache. -Zofran when necessary at this time.  #3 insulin-dependent diabetes mellitus-A1c is elevated. Continue Lantus twice a day, also on sliding scale insulin.  #4 hypothyroidism-TSH within normal limits. Continue Tapazole  #5 hypertension-hold Coreg and Norvasc this morning due to drop in blood pressure.  #6 DVT prophylaxis-on Lovenox    All the records are reviewed and case discussed with Care Management/Social Workerr. Management plans discussed with the patient, family and they are in agreement.  CODE STATUS: Full Code  TOTAL TIME TAKING CARE OF THIS PATIENT: 38 minutes.   POSSIBLE D/C IN 1 DAY, DEPENDING ON CLINICAL CONDITION.   Gladstone Lighter M.D on 04/28/2016 at 8:32 AM  Between 7am to 6pm - Pager - [phone]  After 6pm go to [email or web address] - password [email or web address] [place]  Office  [phone]  CC: Primary care physician; Boyce Medici, FNP

## 2016-04-28 NOTE — Progress Notes (Signed)
PT Cancellation Note  Patient Details Name: Emily [name] MRN: [id number] DOB: May 07, 1960   Cancelled Treatment:    Reason Eval/Treat Not Completed: Patient at procedure or test/unavailable.  Pt currently off floor for imaging.  Will re-attempt PT eval at a later date/time.  Leitha Bleak, PT 04/28/16, 10:02 AM [phone]

## 2016-04-29 LAB — HIV ANTIBODY (ROUTINE TESTING W REFLEX): HIV Screen 4th Generation wRfx: NONREACTIVE

## 2016-04-29 LAB — HEMOGLOBIN A1C
Hgb A1c MFr Bld: 13.8 % — ABNORMAL HIGH (ref 4.8–5.6)
MEAN PLASMA GLUCOSE: 349 mg/dL

## 2016-04-29 NOTE — Discharge Summary (Signed)
[facility] NAME: Emily [name]    MR#:  [id number]  DATE OF BIRTH:  04-01-1960  DATE OF ADMISSION:  04/27/2016   ADMITTING PHYSICIAN: Demetrios Loll, MD  DATE OF DISCHARGE: 04/28/2016  4:54 PM  PRIMARY CARE PHYSICIAN: Boyce Medici, FNP   ADMISSION DIAGNOSIS:   Tingling [R20.2] Change in vision [H53.9]  DISCHARGE DIAGNOSIS:   Active Problems:   Left sided numbness   SECONDARY DIAGNOSIS:   Past Medical History:  Diagnosis Date  . Diabetes mellitus without complication ([place])   . Hypertension   . Thyroid disease     HOSPITAL COURSE:   56 year old female with past medical history significant for hypertension, diabetes and hyperthyroidism presents for hospital secondary to headache, double vision and also the left-sided tingling and numbness.  #1 left-sided headache with left-sided tingling and triggered after a headache. -Once headache was controlled, her symptoms have resolved. No prior history of migraine headaches. -MRI and MRA of the brain without any acute abnormalities. Carotid Dopplers with no hemodynamically significant stenosis. Echocardiogram is done and the results are pending at this time.  -On aspirin and statin  #2 nausea-likely secondary to headache. -Resolved prior to discharge  #3 insulin-dependent diabetes mellitus-A1c is 13.8. Continue Lantus twice a day, also on liraglutide, Januvia and glipizide.  #4 hyperthyroidism-TSH within normal limits. Continue Tapazole  #5 hypertension- being discharged on Norvasc and Coreg  Patient is stable for discharge.    DISCHARGE CONDITIONS:   Guarded CONSULTS OBTAINED:   Treatment Team:  Leotis Pain, MD  DRUG ALLERGIES:   No Known Allergies DISCHARGE MEDICATIONS:   Allergies as of 04/28/2016   No Known Allergies     Medication List    STOP taking these medications   hydrochlorothiazide 25 MG tablet Commonly known as:  HYDRODIURIL   lisinopril 40 MG tablet Commonly known as:  PRINIVIL,ZESTRIL   potassium chloride 20 MEQ packet Commonly known as:  KLOR-CON     TAKE these medications   albuterol 108 (90 Base) MCG/ACT inhaler Commonly known as:  PROVENTIL HFA;VENTOLIN HFA Inhale 2 puffs into the lungs every 6 (six) hours as needed for wheezing or shortness of breath.   amLODipine 5 MG tablet Commonly known as:  NORVASC TAKE ONE TABLET BY MOUTH EVERY DAY   aspirin 81 MG tablet Take 81 mg by mouth daily.   atorvastatin 40 MG tablet Commonly known as:  LIPITOR TAKE ONE TABLET BY MOUTH EVERY DAY   carvedilol 25 MG tablet Commonly known as:  COREG TAKE ONE TABLET BY MOUTH 2 TIMES A DAY   cyclobenzaprine 5 MG tablet Commonly known as:  FLEXERIL TAKE ONE TABLET BY MOUTH EVERY DAY AS NEEDED FOR MUSCLE SPASMS   glipiZIDE 10 MG tablet Commonly known as:  GLUCOTROL TAKE ONE TABLET BY MOUTH 2 TIMES A DAY   Insulin Glargine 100 UNIT/ML Solostar Pen Commonly known as:  LANTUS SOLOSTAR Inject 45 Units into the skin 2 (two) times daily.   liraglutide 18 MG/3ML Sopn Inject 0.3 mLs (1.8 mg total) into the skin every morning.   methimazole 10 MG tablet Commonly known as:  TAPAZOLE Take 1 tablet (10 mg total) by mouth 2 (two) times daily.   sitaGLIPtin 50 MG tablet Commonly known as:  JANUVIA Take 50 mg by mouth daily.   SPIRIVA RESPIMAT 1.25 MCG/ACT Aers Generic drug:  Tiotropium Bromide Monohydrate Inhale 2 puffs into the lungs 2 (two) times daily. What changed:  Another medication with  the same name was removed. Continue taking this medication, and follow the directions you see here.   varenicline 1 MG tablet Commonly known as:  CHANTIX TAKE 1/2 TABLET DAILY FOR 3 DAYS; THEN 1/2 TABLET TWICE DAILY TIMES 4DAYS; THEN 1 TABLET TWICE DAILY THEREAFTER. STOP SMOKING ON DAY 4. Notes to patient:  Take as prescribed        DISCHARGE INSTRUCTIONS:   1. PCP follow-up in 1-2 weeks  DIET:   Cardiac  diet  ACTIVITY:   Activity as tolerated  OXYGEN:   Home Oxygen: No.  Oxygen Delivery: room air  DISCHARGE LOCATION:   home   If you experience worsening of your admission symptoms, develop shortness of breath, life threatening emergency, suicidal or homicidal thoughts you must seek medical attention immediately by calling 911 or calling your MD immediately  if symptoms less severe.  You Must read complete instructions/literature along with all the possible adverse reactions/side effects for all the Medicines you take and that have been prescribed to you. Take any new Medicines after you have completely understood and accpet all the possible adverse reactions/side effects.   Please note  You were cared for by a hospitalist during your hospital stay. If you have any questions about your discharge medications or the care you received while you were in the hospital after you are discharged, you can call the unit and asked to speak with the hospitalist on call if the hospitalist that took care of you is not available. Once you are discharged, your primary care physician will handle any further medical issues. Please note that NO REFILLS for any discharge medications will be authorized once you are discharged, as it is imperative that you return to your primary care physician (or establish a relationship with a primary care physician if you do not have one) for your aftercare needs so that they can reassess your need for medications and monitor your lab values.    On the day of Discharge:  VITAL SIGNS:   Blood pressure 124/70, pulse 79, temperature 97.8 F (36.6 C), temperature source Oral, resp. rate 18, height 5\' 3"  (1.6 m), weight 92.4 kg (203 lb 9.6 oz), SpO2 93 %.  PHYSICAL EXAMINATION:    GENERAL:  56 y.o.-year-old patient lying in the bed with no acute distress.  EYES: Pupils equal, round, reactive to light and accommodation. No scleral icterus. Extraocular muscles intact.   HEENT: Head atraumatic, normocephalic. Oropharynx and nasopharynx clear.  NECK:  Supple, no jugular venous distention. No thyroid enlargement, no tenderness.  LUNGS: Normal breath sounds bilaterally, no wheezing, rales,rhonchi or crepitation. No use of accessory muscles of respiration.  CARDIOVASCULAR: S1, S2 normal. No murmurs, rubs, or gallops.  ABDOMEN: Soft, nontender, nondistended. Bowel sounds present. No organomegaly or mass.  EXTREMITIES: No pedal edema, cyanosis, or clubbing.  NEUROLOGIC: Cranial nerves II through XII are intact. Muscle strength 5/5 in all extremities. Sensation intact. Gait not checked.  PSYCHIATRIC: The patient is alert and oriented x 3.  SKIN: No obvious rash, lesion, or ulcer.    DATA REVIEW:   CBC  Recent Labs Lab 04/27/16 1156  WBC 5.1  HGB 14.5  HCT 43.9  PLT 167    Chemistries   Recent Labs Lab 04/28/16 0708  NA 136  K 3.5  CL 103  CO2 28  GLUCOSE 178*  BUN 19  CREATININE 1.22*  CALCIUM 8.6*     Microbiology Results  Results for orders placed or performed during the hospital encounter of 01/14/16  Rapid Influenza A&B Antigens ([facility] only)     Status: None   Collection Time: 01/14/16  1:40 PM  Result Value Ref Range Status   Influenza A ([facility]) NEGATIVE NEGATIVE Final   Influenza B ([facility]) NEGATIVE NEGATIVE Final    RADIOLOGY:  No results found.   Management plans discussed with the patient, family and they are in agreement.  CODE STATUS:  Code Status History    Date Active Date Inactive Code Status Order ID Comments User Context   04/27/2016  8:25 PM 04/28/2016  7:54 PM Full Code [id number]  Demetrios Loll, MD Inpatient   01/15/2016  3:58 AM 01/16/2016  5:52 PM Full Code [id number]  Harvie Bridge, DO Inpatient      TOTAL TIME TAKING CARE OF THIS PATIENT: 38 minutes.    Gladstone Lighter M.D on 04/29/2016 at 2:55 PM  Between 7am to 6pm - Pager - [phone]  After 6pm go to [email or web address] - Proofreader  [facility]  Office  [phone]  CC: Primary care physician; Boyce Medici, FNP   Note: This dictation was prepared with Dragon dictation along with smaller phrase technology. Any transcriptional errors that result from this process are unintentional.

## 2016-04-30 ENCOUNTER — Ambulatory Visit: Payer: Self-pay | Admitting: Family Medicine

## 2016-04-30 ENCOUNTER — Encounter: Payer: Self-pay | Admitting: Family Medicine

## 2016-04-30 VITALS — BP 145/87 | HR 78 | Temp 98.3°F | Wt 207.9 lb

## 2016-04-30 DIAGNOSIS — N183 Chronic kidney disease, stage 3 unspecified: Secondary | ICD-10-CM

## 2016-04-30 DIAGNOSIS — H538 Other visual disturbances: Secondary | ICD-10-CM

## 2016-04-30 DIAGNOSIS — E041 Nontoxic single thyroid nodule: Secondary | ICD-10-CM

## 2016-04-30 DIAGNOSIS — E1165 Type 2 diabetes mellitus with hyperglycemia: Secondary | ICD-10-CM

## 2016-04-30 DIAGNOSIS — E119 Type 2 diabetes mellitus without complications: Secondary | ICD-10-CM

## 2016-04-30 HISTORY — DX: Nontoxic single thyroid nodule: E04.1

## 2016-04-30 LAB — GLUCOSE, POCT (MANUAL RESULT ENTRY): POC Glucose: 226 mg/dl — AB (ref 70–99)

## 2016-04-30 MED ORDER — LISINOPRIL 10 MG PO TABS: 10.0000 mg | ORAL_TABLET | Freq: Every day | ORAL | 0 refills | Status: DC

## 2016-04-30 NOTE — Assessment & Plan Note (Signed)
Uncontrolled; patient will ask endocrinologist if she can take over management of her diabetes until it's better controlled

## 2016-04-30 NOTE — Patient Instructions (Addendum)
Please have Dr. Cruzita Lederer look at your ultrasound because it looks like you have a nodule on the left side of your thyroid that may need to be biopsied Please do ask Dr. Cruzita Lederer if she is willing to help you with your diabetes as well since it is out of control Please start back on 10 mg of lisinopril daily Really do try to watch your diet, avoid sweet drinks Diet Sunkist is okay Return in 2 weeks to recheck you kidneys and blood pressure Your goal blood pressure is less than 130 mmHg on top. Try to follow the DASH guidelines (DASH stands for Dietary Approaches to Stop Hypertension) Try to limit the sodium in your diet.  Ideally, consume less than 1.5 grams (less than 1,500mg ) per day. Do not add salt when cooking or at the table.  Check the sodium amount on labels when shopping, and choose items lower in sodium when given a choice. Avoid or limit foods that already contain a lot of sodium. Eat a diet rich in fruits and vegetables and whole grains.  Diabetes Mellitus and Food It is important for you to manage your blood sugar (glucose) level. Your blood glucose level can be greatly affected by what you eat. Eating healthier foods in the appropriate amounts throughout the day at about the same time each day will help you control your blood glucose level. It can also help slow or prevent worsening of your diabetes mellitus. Healthy eating may even help you improve the level of your blood pressure and reach or maintain a healthy weight. General recommendations for healthful eating and cooking habits include:  Eating meals and snacks regularly. Avoid going long periods of time without eating to lose weight.  Eating a diet that consists mainly of plant-based foods, such as fruits, vegetables, nuts, legumes, and whole grains.  Using low-heat cooking methods, such as baking, instead of high-heat cooking methods, such as deep frying. Work with your dietitian to make sure you understand how to use the  Nutrition Facts information on food labels. How can food affect me? Carbohydrates  Carbohydrates affect your blood glucose level more than any other type of food. Your dietitian will help you determine how many carbohydrates to eat at each meal and teach you how to count carbohydrates. Counting carbohydrates is important to keep your blood glucose at a healthy level, especially if you are using insulin or taking certain medicines for diabetes mellitus. Alcohol  Alcohol can cause sudden decreases in blood glucose (hypoglycemia), especially if you use insulin or take certain medicines for diabetes mellitus. Hypoglycemia can be a life-threatening condition. Symptoms of hypoglycemia (sleepiness, dizziness, and disorientation) are similar to symptoms of having too much alcohol. If your health care provider has given you approval to drink alcohol, do so in moderation and use the following guidelines:  Women should not have more than one drink per day, and men should not have more than two drinks per day. One drink is equal to:  12 oz of beer.  5 oz of wine.  1 oz of hard liquor.  Do not drink on an empty stomach.  Keep yourself hydrated. Have water, diet soda, or unsweetened iced tea.  Regular soda, juice, and other mixers might contain a lot of carbohydrates and should be counted. What foods are not recommended? As you make food choices, it is important to remember that all foods are not the same. Some foods have fewer nutrients per serving than other foods, even though they might have  the same number of calories or carbohydrates. It is difficult to get your body what it needs when you eat foods with fewer nutrients. Examples of foods that you should avoid that are high in calories and carbohydrates but low in nutrients include:  Trans fats (most processed foods list trans fats on the Nutrition Facts label).  Regular soda.  Juice.  Candy.  Sweets, such as cake, pie, doughnuts, and  cookies.  Fried foods. What foods can I eat? Eat nutrient-rich foods, which will nourish your body and keep you healthy. The food you should eat also will depend on several factors, including:  The calories you need.  The medicines you take.  Your weight.  Your blood glucose level.  Your blood pressure level.  Your cholesterol level. You should eat a variety of foods, including:  Protein.  Lean cuts of meat.  Proteins low in saturated fats, such as fish, egg whites, and beans. Avoid processed meats.  Fruits and vegetables.  Fruits and vegetables that may help control blood glucose levels, such as apples, mangoes, and yams.  Dairy products.  Choose fat-free or low-fat dairy products, such as milk, yogurt, and cheese.  Grains, bread, pasta, and rice.  Choose whole grain products, such as multigrain bread, whole oats, and brown rice. These foods may help control blood pressure.  Fats.  Foods containing healthful fats, such as nuts, avocado, olive oil, canola oil, and fish. Does everyone with diabetes mellitus have the same meal plan? Because every person with diabetes mellitus is different, there is not one meal plan that works for everyone. It is very important that you meet with a dietitian who will help you create a meal plan that is just right for you. This information is not intended to replace advice given to you by your health care provider. Make sure you discuss any questions you have with your health care provider. Document Released: 09/18/2004 Document Revised: 05/30/2015 Document Reviewed: 11/18/2012 Elsevier Interactive Patient Education  2017 [place] DASH stands for "Dietary Approaches to Stop Hypertension." The DASH eating plan is a healthy eating plan that has been shown to reduce high blood pressure (hypertension). It may also reduce your risk for type 2 diabetes, heart disease, and stroke. The DASH eating plan may also help with  weight loss. What are tips for following this plan? General guidelines   Avoid eating more than 2,300 mg (milligrams) of salt (sodium) a day. If you have hypertension, you may need to reduce your sodium intake to 1,500 mg a day.  Limit alcohol intake to no more than 1 drink a day for nonpregnant women and 2 drinks a day for men. One drink equals 12 oz of beer, 5 oz of wine, or 1 oz of hard liquor.  Work with your health care provider to maintain a healthy body weight or to lose weight. Ask what an ideal weight is for you.  Get at least 30 minutes of exercise that causes your heart to beat faster (aerobic exercise) most days of the week. Activities may include walking, swimming, or biking.  Work with your health care provider or diet and nutrition specialist (dietitian) to adjust your eating plan to your individual calorie needs. Reading food labels   Check food labels for the amount of sodium per serving. Choose foods with less than 5 percent of the Daily Value of sodium. Generally, foods with less than 300 mg of sodium per serving fit into this eating plan.  To find whole grains, look for the word "whole" as the first word in the ingredient list. Shopping   Buy products labeled as "low-sodium" or "no salt added."  Buy fresh foods. Avoid canned foods and premade or frozen meals. Cooking   Avoid adding salt when cooking. Use salt-free seasonings or herbs instead of table salt or sea salt. Check with your health care provider or pharmacist before using salt substitutes.  Do not fry foods. Cook foods using healthy methods such as baking, boiling, grilling, and broiling instead.  Cook with heart-healthy oils, such as olive, canola, soybean, or sunflower oil. Meal planning    Eat a balanced diet that includes:  5 or more servings of fruits and vegetables each day. At each meal, try to fill half of your plate with fruits and vegetables.  Up to 6-8 servings of whole grains each  day.  Less than 6 oz of lean meat, poultry, or fish each day. A 3-oz serving of meat is about the same size as a deck of cards. One egg equals 1 oz.  2 servings of low-fat dairy each day.  A serving of nuts, seeds, or beans 5 times each week.  Heart-healthy fats. Healthy fats called Omega-3 fatty acids are found in foods such as flaxseeds and coldwater fish, like sardines, salmon, and mackerel.  Limit how much you eat of the following:  Canned or prepackaged foods.  Food that is high in trans fat, such as fried foods.  Food that is high in saturated fat, such as fatty meat.  Sweets, desserts, sugary drinks, and other foods with added sugar.  Full-fat dairy products.  Do not salt foods before eating.  Try to eat at least 2 vegetarian meals each week.  Eat more home-cooked food and less restaurant, buffet, and fast food.  When eating at a restaurant, ask that your food be prepared with less salt or no salt, if possible. What foods are recommended? The items listed may not be a complete list. Talk with your dietitian about what dietary choices are best for you. Grains  Whole-grain or whole-wheat bread. Whole-grain or whole-wheat pasta. Brown rice. [place]row. Bulgur. Whole-grain and low-sodium cereals. Pita bread. Low-fat, low-sodium crackers. Whole-wheat flour tortillas. Vegetables  Fresh or frozen vegetables (raw, steamed, roasted, or grilled). Low-sodium or reduced-sodium tomato and vegetable juice. Low-sodium or reduced-sodium tomato sauce and tomato paste. Low-sodium or reduced-sodium canned vegetables. Fruits  All fresh, dried, or frozen fruit. Canned fruit in natural juice (without added sugar). Meat and other protein foods  Skinless chicken or [place]. Ground chicken or [place]. Pork with fat trimmed off. Fish and seafood. Egg whites. Dried beans, peas, or lentils. Unsalted nuts, nut butters, and seeds. Unsalted canned beans. Lean cuts of beef with fat trimmed off.  Low-sodium, lean deli meat. Dairy  Low-fat (1%) or fat-free (skim) milk. Fat-free, low-fat, or reduced-fat cheeses. Nonfat, low-sodium ricotta or cottage cheese. Low-fat or nonfat yogurt. Low-fat, low-sodium cheese. Fats and oils  Soft margarine without trans fats. Vegetable oil. Low-fat, reduced-fat, or light mayonnaise and salad dressings (reduced-sodium). Canola, safflower, olive, soybean, and sunflower oils. Avocado. Seasoning and other foods  Herbs. Spices. Seasoning mixes without salt. Unsalted popcorn and pretzels. Fat-free sweets. What foods are not recommended? The items listed may not be a complete list. Talk with your dietitian about what dietary choices are best for you. Grains  Baked goods made with fat, such as croissants, muffins, or some breads. Dry pasta or rice meal packs. Vegetables  Creamed or fried vegetables. Vegetables in a cheese sauce. Regular canned vegetables (not low-sodium or reduced-sodium). Regular canned tomato sauce and paste (not low-sodium or reduced-sodium). Regular tomato and vegetable juice (not low-sodium or reduced-sodium). Angie Fava. Olives. Fruits  Canned fruit in a light or heavy syrup. Fried fruit. Fruit in cream or butter sauce. Meat and other protein foods  Fatty cuts of meat. Ribs. Fried meat. Berniece Salines. Sausage. Bologna and other processed lunch meats. Salami. Fatback. Hotdogs. Bratwurst. Salted nuts and seeds. Canned beans with added salt. Canned or smoked fish. Whole eggs or egg yolks. Chicken or [place] with skin. Dairy  Whole or 2% milk, cream, and half-and-half. Whole or full-fat cream cheese. Whole-fat or sweetened yogurt. Full-fat cheese. Nondairy creamers. Whipped toppings. Processed cheese and cheese spreads. Fats and oils  Butter. Stick margarine. Lard. Shortening. Ghee. Bacon fat. Tropical oils, such as coconut, palm kernel, or palm oil. Seasoning and other foods  Salted popcorn and pretzels. Onion salt, garlic salt, seasoned salt, table salt,  and sea salt. Worcestershire sauce. Tartar sauce. Barbecue sauce. Teriyaki sauce. Soy sauce, including reduced-sodium. Steak sauce. Canned and packaged gravies. Fish sauce. Oyster sauce. Cocktail sauce. Horseradish that you find on the shelf. Ketchup. Mustard. Meat flavorings and tenderizers. Bouillon cubes. Hot sauce and Tabasco sauce. Premade or packaged marinades. Premade or packaged taco seasonings. Relishes. Regular salad dressings. Where to find more information:  National Heart, Lung, and [place]: https://wilson-eaton.com/  American Heart Association: www.heart.org Summary  The DASH eating plan is a healthy eating plan that has been shown to reduce high blood pressure (hypertension). It may also reduce your risk for type 2 diabetes, heart disease, and stroke.  With the DASH eating plan, you should limit salt (sodium) intake to 2,300 mg a day. If you have hypertension, you may need to reduce your sodium intake to 1,500 mg a day.  When on the DASH eating plan, aim to eat more fresh fruits and vegetables, whole grains, lean proteins, low-fat dairy, and heart-healthy fats.  Work with your health care provider or diet and nutrition specialist (dietitian) to adjust your eating plan to your individual calorie needs. This information is not intended to replace advice given to you by your health care provider. Make sure you discuss any questions you have with your health care provider. Document Released: 12/11/2010 Document Revised: 12/16/2015 Document Reviewed: 12/16/2015 Elsevier Interactive Patient Education  2017 [employer].

## 2016-04-30 NOTE — Assessment & Plan Note (Signed)
Left side of thyroid; has appt with endo next week; she will ask Dr. Cruzita Lederer if she can have this biopsied

## 2016-04-30 NOTE — Progress Notes (Signed)
BP (!) 145/87   Pulse 78   Temp 98.3 F (36.8 C)   Wt 207 lb 14.4 oz (94.3 kg)   BMI 36.83 kg/m    Subjective:    Patient ID: Emily [name], female    DOB: 1960-12-06, 56 y.o.   MRN: [id number]  HPI: Emily [name] is a 56 y.o. female  Chief Complaint  Patient presents with  . Follow-up  . 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 Diabetes mellitus without complication ([place])   . Hypertension   . Thyroid disease   . Thyroid nodule 04/30/2016   No past surgical history on file. Family History  Problem Relation Age of Onset  . Diabetes Mother   . Hypertension Mother   . Diabetes Father   . Hypertension Father   . Diabetes Sister    Social History  Substance Use Topics  . Smoking status: Former Smoker    Packs/day: 1.50    Types: Cigarettes    Quit date: 01/13/2016  . Smokeless tobacco: Never Used  . Alcohol use No   Interim medical history since last visit reviewed. Allergies and medications reviewed  Review of Systems Per HPI unless specifically indicated above     Objective:    BP (!) 145/87   Pulse 78   Temp 98.3 F (36.8 C)   Wt 207 lb 14.4 oz  (94.3 kg)   BMI 36.83 kg/m   Wt Readings from Last 3 Encounters:  04/30/16 207 lb 14.4 oz (94.3 kg)  04/27/16 203 lb 9.6 oz (92.4 kg)  02/24/16 204 lb (92.5 kg)    Physical Exam  Constitutional: She appears well-developed and well-nourished.  HENT:  Mouth/Throat: Mucous membranes are normal.  Eyes: EOM are normal. No scleral icterus.  Neck: Thyroid mass (left side thyroid) present.  Cardiovascular: Normal rate and regular rhythm.   Pulmonary/Chest: Effort normal and breath sounds normal.  Neurological: She is alert.  Skin: No pallor.  Psychiatric: She has a normal mood and affect. Her behavior is normal.    Results for orders placed or performed in visit on 04/30/16  POCT Glucose (CBG)  Result Value Ref Range   POC Glucose 226 (A) 70 - 99 mg/dl      Assessment & Plan:   Problem List Items Addressed This Visit      Endocrine   Thyroid nodule    Left side of thyroid; has appt with endo next week; she will ask Dr. Cruzita Lederer if she can have this biopsied      Diabetes [facility])    Uncontrolled; patient will ask endocrinologist if she can take over management of her diabetes  until it's better controlled      Relevant Medications   lisinopril (PRINIVIL,ZESTRIL) 10 MG tablet     Genitourinary   Chronic kidney disease    Start back on low dose lisinopril       Other Visit Diagnoses    Diabetes mellitus without complication ([place])    -  Primary   Relevant Medications   lisinopril (PRINIVIL,ZESTRIL) 10 MG tablet   Other Relevant Orders   POCT Glucose (CBG) (Completed)   Blurred vision, bilateral       left worse than the right; associated with possible stroke; refer to eye doctor   Relevant Orders   Ambulatory referral to Ophthalmology      Follow up plan: Return in about 2 weeks (around 05/14/2016) for nonfasting labs and visit for blood pressure and kidneys (BMP).  An after-visit summary was printed and given to the patient at [place].  Please see the patient  instructions which may contain other information and recommendations beyond what is mentioned above in the assessment and plan.  Meds ordered this encounter  Medications  . lisinopril (PRINIVIL,ZESTRIL) 10 MG tablet    Sig: Take 1 tablet (10 mg total) by mouth daily.    Dispense:  30 tablet    Refill:  0    Starting back on lower dose    Orders Placed This Encounter  Procedures  . Ambulatory referral to Ophthalmology  . POCT Glucose (CBG)

## 2016-04-30 NOTE — Assessment & Plan Note (Signed)
Start back on low dose lisinopril

## 2016-05-06 ENCOUNTER — Ambulatory Visit (INDEPENDENT_AMBULATORY_CARE_PROVIDER_SITE_OTHER): Payer: Medicaid Other | Admitting: Internal Medicine

## 2016-05-06 ENCOUNTER — Telehealth: Payer: Self-pay | Admitting: Internal Medicine

## 2016-05-06 ENCOUNTER — Encounter: Payer: Self-pay | Admitting: Internal Medicine

## 2016-05-06 VITALS — BP 130/88 | HR 73 | Wt 202.0 lb

## 2016-05-06 DIAGNOSIS — E040 Nontoxic diffuse goiter: Secondary | ICD-10-CM

## 2016-05-06 DIAGNOSIS — E0590 Thyrotoxicosis, unspecified without thyrotoxic crisis or storm: Secondary | ICD-10-CM | POA: Diagnosis not present

## 2016-05-06 DIAGNOSIS — E1159 Type 2 diabetes mellitus with other circulatory complications: Secondary | ICD-10-CM

## 2016-05-06 DIAGNOSIS — E1165 Type 2 diabetes mellitus with hyperglycemia: Secondary | ICD-10-CM | POA: Diagnosis not present

## 2016-05-06 DIAGNOSIS — E049 Nontoxic goiter, unspecified: Secondary | ICD-10-CM | POA: Diagnosis not present

## 2016-05-06 MED ORDER — EMPAGLIFLOZIN 10 MG PO TABS: 10.0000 mg | ORAL_TABLET | Freq: Every day | ORAL | 3 refills | Status: DC

## 2016-05-06 NOTE — Patient Instructions (Addendum)
Please continue: - Lantus 45 units 2x a day - Januvia 50 mg before b'fast  Add: - Jardiance 10 mg before b'fast  Check sugars before meals and write them down. Try to check 2x a day.   Please stop Methimazole and come back for labs in 5 weeks (before June 14th).  Please come back for a follow-up appointment in 3 months.  PATIENT INSTRUCTIONS FOR TYPE 2 DIABETES:  **Please join MyChart!** - see attached instructions about how to join if you have not done so already.  DIET AND EXERCISE Diet and exercise is an important part of diabetic treatment.  We recommended aerobic exercise in the form of brisk walking (working between 40-60% of maximal aerobic capacity, similar to brisk walking) for 150 minutes per week (such as 30 minutes five days per week) along with 3 times per week performing 'resistance' training (using various gauge rubber tubes with handles) 5-10 exercises involving the major muscle groups (upper body, lower body and core) performing 10-15 repetitions (or near fatigue) each exercise. Start at half the above goal but build slowly to reach the above goals. If limited by weight, joint pain, or disability, we recommend daily walking in a swimming pool with water up to waist to reduce pressure from joints while allow for adequate exercise.    BLOOD GLUCOSES Monitoring your blood glucoses is important for continued management of your diabetes. Please check your blood glucoses 2-4 times a day: fasting, before meals and at bedtime (you can rotate these measurements - e.g. one day check before the 3 meals, the next day check before 2 of the meals and before bedtime, etc.).   HYPOGLYCEMIA (low blood sugar) Hypoglycemia is usually a reaction to not eating, exercising, or taking too much insulin/ other diabetes drugs.  Symptoms include tremors, sweating, hunger, confusion, headache, etc. Treat IMMEDIATELY with 15 grams of Carbs: . 4 glucose tablets .  cup regular juice/soda . 2  tablespoons raisins . 4 teaspoons sugar . 1 tablespoon honey Recheck blood glucose in 15 mins and repeat above if still symptomatic/blood glucose <100.  RECOMMENDATIONS TO REDUCE YOUR RISK OF DIABETIC COMPLICATIONS: * Take your prescribed MEDICATION(S) * Follow a DIABETIC diet: Complex carbs, fiber rich foods, (monounsaturated and polyunsaturated) fats * AVOID saturated/trans fats, high fat foods, >2,300 mg salt per day. * EXERCISE at least 5 times a week for 30 minutes or preferably daily.  * DO NOT SMOKE OR DRINK more than 1 drink a day. * Check your FEET every day. Do not wear tightfitting shoes. Contact us if you develop an ulcer * See your EYE doctor once a year or more if needed * Get a FLU shot once a year * Get a PNEUMONIA vaccine once before and once after age 20 years  GOALS:  * Your Hemoglobin A1c of <7%  * fasting sugars need to be <130 * after meals sugars need to be <180 (2h after you start eating) * Your Systolic BP should be 622 or lower  * Your Diastolic BP should be 80 or lower  * Your HDL (Good Cholesterol) should be 40 or higher  * Your LDL (Bad Cholesterol) should be 100 or lower. * Your Triglycerides should be 150 or lower  * Your Urine microalbumin (kidney function) should be <30 * Your Body Mass Index should be 25 or lower    Please consider the following ways to cut down carbs and fat and increase fiber and micronutrients in your diet: - substitute whole grain for  white bread or pasta - substitute brown rice for white rice - substitute 90-calorie flat bread pieces for slices of bread when possible - substitute sweet potatoes or yams for white potatoes - substitute humus for margarine - substitute tofu for cheese when possible - substitute almond or rice milk for regular milk (would not drink soy milk daily due to concern for soy estrogen influence on breast cancer risk) - substitute dark chocolate for other sweets when possible - substitute water - can  add lemon or orange slices for taste - for diet sodas (artificial sweeteners will trick your body that you can eat sweets without getting calories and will lead you to overeating and weight gain in the long run) - do not skip breakfast or other meals (this will slow down the metabolism and will result in more weight gain over time)  - can try smoothies made from fruit and almond/rice milk in am instead of regular breakfast - can also try old-fashioned (not instant) oatmeal made with almond/rice milk in am - order the dressing on the side when eating salad at a restaurant (pour less than half of the dressing on the salad) - eat as little meat as possible - can try juicing, but should not forget that juicing will get rid of the fiber, so would alternate with eating raw veg./fruits or drinking smoothies - use as little oil as possible, even when using olive oil - can dress a salad with a mix of balsamic vinegar and lemon juice, for e.g. - use agave nectar, stevia sugar, or regular sugar rather than artificial sweateners - steam or broil/roast veggies  - snack on veggies/fruit/nuts (unsalted, preferably) when possible, rather than processed foods - reduce or eliminate aspartame in diet (it is in diet sodas, chewing gum, etc) Read the labels!  Try to read Dr. Janene Harvey book: "Program for Reversing Diabetes" for other ideas for healthy eating.

## 2016-05-06 NOTE — Progress Notes (Addendum)
Patient ID: Emily [name], female   DOB: October 19, 1960, 56 y.o.   MRN: [id number]   HPI  Emily [name] is a 56 y.o.-year-old female, returning for f/u for thyrotoxicosis/goiter and would also want me to manage her DM2, uncontrolled, insulin-dependent, with complications (CKD stage 3, cerebro-vascular ds. - s/p stroke). Last visit 6 mo ago.  She was admitted for L sided numbness >> no acute CVA but old parietal stroke seen on MRI.  TMNG/Graves ds.: Reviewed and addended hx: Pt was dx'ed with hyperthyroidism in ~2014 >> started MMI, lately 10 mg 2x daily. She was lost for f/u at [facility] as she could not travel over there.  Thyroid uptake and scan (06/22/2013) showed: FINDINGS: The 24 hour uptake was 24.7%. The normal reference range at 24 hours is 15 to 35%.   The thyroid scan demonstrates symmetric, mildly heterogenous distribution of radiopharmaceutical throughout the thyroid, which is increased in size.  IMPRESSION: Mildly heterogenous iodine uptake, cannot exclude toxic multinodular goiter versus possible mild Graves disease. Consider ultrasound for further correlation.  Patient was on methimazole before, however, this was stopped after  TSH returned very elevated, in the 90s (02/2015) >> TSH decreased to 8, then normalized >> we continued her off MMI.  However, at this visit, she brings a medicine bag with MMI 10 mg 2x a day (re-Rx by Dr. Rosanna Randy: 04/17/2016, [facility]) - I am not sure if Dr. Rosanna Randy was aware that she was off the [place]. She is now on this for 2 weeks.  I reviewed pt's thyroid tests: Lab Results  Component Value Date   TSH 2.840 01/14/2016   TSH 3.20 12/10/2015   TSH 7.49 (H) 11/07/2015   TSH 2.35 07/31/2015   TSH 2.57 06/13/2015   TSH 8.090 (H) 04/09/2015   TSH 94.49 (A) 02/28/2015   FREET4 0.47 (L) 12/10/2015   FREET4 0.32 (L) 11/07/2015   FREET4 0.62 07/31/2015   FREET4 0.57 (L) 06/13/2015   FREET4 0.30 (L) 04/09/2015   Previously:   Pt  mentions: - not feeling nodules in neck - + hoarseness - no dysphagia - no choking - + SOB with lying down  Pt does have a FH of thyroid ds.: aunt. No FH of thyroid cancer. No h/o radiation tx to head or neck.  No seaweed or kelp. She did have recent contrast studies (CTA 01/2016). No steroid use. No herbal supplements. No Biotin use.  DM2: - complications: h/o stroke, CKD  Her last HbA1c was very high:  Lab Results  Component Value Date   HGBA1C 13.8 (H) 04/28/2016   HGBA1C 10.3 (H) 01/14/2016   HGBA1C 12.3 (H) 07/16/2015   She is on: - Lantus 45 units 2x a day - Januvia 50 mg in am She was on Metformin >> diarrhea.  She checks sugars 1x a day: - am: 126-162 in last 2 weeks - after her discharge, prev. 200-423 - 2h after b'fast: n/c - lunch: n/c - 2h after lunch: n/c - dinner: n/c - 2h after dinner: n/c - bedtime: n/c  + CKD: Lab Results  Component Value Date   BUN 19 04/28/2016   Lab Results  Component Value Date   CREATININE 1.22 (H) 04/28/2016   She will have a new eye exam 05/28/2016.  ROS: Constitutional: no weight gain/no weight loss, no fatigue, no subjective hyperthermia, no subjective hypothermia Eyes: + blurry vision, no xerophthalmia ENT: no sore throat, no nodules palpated in throat, no dysphagia, no odynophagia, no hoarseness Cardiovascular: no CP/no SOB/no palpitations/no  leg swelling Respiratory: + cough/no SOB/+ wheezing Gastrointestinal: no N/no V/no D/no C/+ acid reflux Musculoskeletal: no muscle aches/no joint aches Skin: no rashes, no hair loss Neurological: no tremors/no numbness/no tingling/no dizziness/+ HA  I reviewed pt's medications, allergies, PMH, social hx, family hx, and changes were documented in the history of present illness. Otherwise, unchanged from my initial visit note.  Past Medical History:  Diagnosis Date  . Diabetes mellitus without complication ([place])   . Hypertension   . Thyroid disease   . Thyroid nodule  04/30/2016   No past surgical history on file. Social History   Social History  . Marital Status: Single    Spouse Name: N/A  . Number of Children: 1   Occupational History  . None   Social History Main Topics  . Smoking status: Current Every Day Smoker -- 1.00 packs/day  . Smokeless tobacco: Not on file  . Alcohol Use: No  . Drug Use: No   Current Outpatient Prescriptions on File Prior to Visit  Medication Sig Dispense Refill  . albuterol (PROVENTIL HFA;VENTOLIN HFA) 108 (90 Base) MCG/ACT inhaler Inhale 2 puffs into the lungs every 6 (six) hours as needed for wheezing or shortness of breath. 1 Inhaler 2  . amLODipine (NORVASC) 5 MG tablet TAKE ONE TABLET BY MOUTH EVERY DAY 60 tablet 0  . aspirin 81 MG tablet Take 81 mg by mouth daily.    Marland Kitchen atorvastatin (LIPITOR) 40 MG tablet TAKE ONE TABLET BY MOUTH EVERY DAY 90 tablet PRN  . carvedilol (COREG) 25 MG tablet TAKE ONE TABLET BY MOUTH 2 TIMES A DAY 180 tablet 1  . glipiZIDE (GLUCOTROL) 10 MG tablet TAKE ONE TABLET BY MOUTH 2 TIMES A DAY 180 tablet 0  . lisinopril (PRINIVIL,ZESTRIL) 10 MG tablet Take 1 tablet (10 mg total) by mouth daily. 30 tablet 0  . methimazole (TAPAZOLE) 10 MG tablet Take 1 tablet (10 mg total) by mouth 2 (two) times daily. 60 tablet 4  . sitaGLIPtin (JANUVIA) 50 MG tablet Take 50 mg by mouth daily.    . Tiotropium Bromide Monohydrate (SPIRIVA RESPIMAT) 1.25 MCG/ACT AERS Inhale 2 puffs into the lungs 2 (two) times daily.    . cyclobenzaprine (FLEXERIL) 5 MG tablet TAKE ONE TABLET BY MOUTH EVERY DAY AS NEEDED FOR MUSCLE SPASMS (Patient not taking: Reported on 05/06/2016) 30 tablet 0  . Insulin Glargine (LANTUS SOLOSTAR) 100 UNIT/ML Solostar Pen Inject 45 Units into the skin 2 (two) times daily. (Patient not taking: Reported on 05/06/2016) 30 mL 6  . liraglutide 18 MG/3ML SOPN Inject 0.3 mLs (1.8 mg total) into the skin every morning. (Patient not taking: Reported on 01/30/2016) 15 mL 6  . varenicline (CHANTIX) 1 MG  tablet TAKE 1/2 TABLET DAILY FOR 3 DAYS; THEN 1/2 TABLET TWICE DAILY TIMES 4DAYS; THEN 1 TABLET TWICE DAILY THEREAFTER. STOP SMOKING ON DAY 4. (Patient not taking: Reported on 04/30/2016) 56 tablet 0   No current facility-administered medications on file prior to visit.    No Known Allergies Family History  Problem Relation Age of Onset  . Diabetes Mother   . Hypertension Mother   . Diabetes Father   . Hypertension Father   . Diabetes Sister    PE: Wt 202 lb (91.6 kg)   BMI 35.78 kg/m  Body mass index is 35.78 kg/m.  Wt Readings from Last 3 Encounters:  05/06/16 202 lb (91.6 kg)  04/30/16 207 lb 14.4 oz (94.3 kg)  04/27/16 203 lb 9.6 oz (92.4 kg)  Constitutional: overweight, in NAD Eyes: PERRLA, EOMI, no exophthalmos, no lid lag, no stare ENT: moist mucous membranes, large L>R thyromegaly, no cervical lymphadenopathy Cardiovascular: RRR, No MRG Respiratory: CTA B Gastrointestinal: abdomen soft, NT, ND, BS+ Musculoskeletal: no deformities, strength intact in all 4 Skin: moist, warm, no rashes  Neurological: no tremor with outstretched hands, DTR normal in all 4  ASSESSMENT: 1. Thyrotoxicosis  2. Large goiter  3. DM2  PLAN:  1. And 2. Patient with an ~3 year h/o thyrotoxicosis, per Uptake and scan: either mild Graves or mild TMNG. She was initially on methimazole, however, we were able to stop this in 02/2016. She has done well since then, with normalized TFTs. However, she is telling me that she was recently given a new prescription for methimazole, by the walk-in clinic doctor. I am not sure whether the doctor knew that she was off the methimazole. Therefore, I told her to stop the methimazole right away and I will have her back for labs in 5 weeks: TSH, fT3 and fT4   2. Large goiter - has some neck compression sxs - will check a thyroid U/S  3. DM2 - new pb for me >> reviewed latest HbA1c levels >> very high - very poorly controlled, but sugars definitely better in  last 2 weeks (? Change >> pt cannot clarify). However, she is only checking sugars in am >> advised to start checking at other times of the day (2x a day), also, rotating check times. As I suspect sugars are increasing as the day goes by >> will try to add Jardiance. - I advised her to: Patient Instructions  Please continue: - Lantus 45 units 2x a day - Januvia 50 mg before b'fast  Add: - Jardiance 10 mg before b'fast  Check sugars before meals and write them down. Try to check 2x a day.   Please come back for a follow-up appointment in 3 months.  - given sugar log and advised how to fill it and to bring it at next appt  - given foot care handout and explained the principles  - given instructions for hypoglycemia management "15-15 rule" - advised for yearly eye exams >> she will have one soon - Return to clinic in 3 mo with sugar log   - time spent with the patient: 40 min, of which >50% was spent in obtaining information about her symptoms, reviewing her previous labs, CBGs, evaluations, and treatments, counseling her about her conditions - including DM (please see the discussed topics above), and developing a plan to further investigate and tx them.  US SOFT TISSUE HEAD AND NECK  Order: [id number]  Status:  Final result Visible to patient:  No (Not Released) Dx:  Goiter diffuse  Details   Reading Physician Reading Date Result Priority  Markus Daft, MD 05/12/2016   Narrative    CLINICAL DATA: Thyromegaly on previous chest CT.  EXAM: THYROID ULTRASOUND  TECHNIQUE: Ultrasound examination of the thyroid gland and adjacent soft tissues was performed.  COMPARISON: Chest CT 01/14/2016  FINDINGS: Parenchymal Echotexture: Moderately heterogenous  Isthmus: 1.2 cm in the AP dimension  Right lobe: 8.4 x 2.8 x 4.5 cm  Left lobe: 8.2 x 3.0 x 3.7 cm  _________________________________________________________  Estimated total number of nodules >/= 1 cm: 6-10  Number of  spongiform nodules >/= 2 cm not described below (TR1): 0  Number of mixed cystic and solid nodules >/= 1.5 cm not described below ([name]): 0  _________________________________________________________  Nodule # 1:  Location: Isthmus; Inferior  Maximum size: 3.3 cm; Other 2 dimensions: 1.7 x 2.7 cm  Composition: solid/almost completely solid (2)  Echogenicity: isoechoic (1)  **Given size (>/= 2.5 cm) and appearance, fine needle aspiration of this mildly suspicious nodule should be considered based on TI-RADS criteria.  _________________________________________________________  Mildly complex cystic structure in the superior right thyroid lobe measuring up to 1.0 cm. Minimally complex cyst in the superior right thyroid lobe measuring up to 1.6 cm. Adjacent complex cysts or spongiform nodules in the mid right thyroid lobe, largest measuring 1.1 cm. Spongiform nodule in the mid left thyroid lobe measuring up to 1.3 cm.  Nodule # 2:  Location: Left; Mid  Maximum size: 2.0 cm; Other 2 dimensions: 0.8 x 0.8 cm  Composition: mixed cystic and solid (1)  Echogenicity: cannot determine (1)  This nodule does NOT meet TI-RADS criteria for biopsy or dedicated follow-up.  _________________________________________________________  IMPRESSION: Thyroid tissue is diffusely heterogeneous and enlarged.  The isthmus has a large isoechoic nodular structure. Unclear if this represents a discrete nodule versus focal heterogeneous thyroid tissue. This area measures up to 3.3 cm and recommend biopsy of this area.  Numerous small nodules and cysts throughout the left and right thyroid lobes. These predominantly cystic nodules do not meet criteria for biopsy or dedicated follow-up.  The above is in keeping with the ACR TI-RADS recommendations - J Am Coll Radiol 2017;14:587-595.   Electronically Signed By: Markus Daft M.D. On: 05/12/2016 12:03       Will advise pt to have a Bx  of the isthmic nodule.  Addendum: Adequacy Reason Satisfactory For Evaluation. Diagnosis THYROID, FINE NEEDLE ASPIRATION ISTHMUS INFERIOR (SPECIMEN 1 OF 1, COLLECTED ON 06/25/2016): CONSISTENT WITH BENIGN FOLLICULAR NODULE (B[place] CATEGORY II). Casimer Lanius MD Pathologist, Electronic Signature (Case signed 06/26/2016) Specimen Clinical Information Nodule 1 Isthmus Inferior 3.3 cm; Other 2 dimensions: 1.7 x 2.7 cm, Solid / almost completely solid, Isoechoic, ACR TI-RADS total points: 3, Midly suspicious nodule Source Thyroid, Fine Needle Aspiration, Isthmus Inferior, (Specimen 1 of 1, colected on 06/25/16 )  Benign result. Philemon Kingdom, MD PhD [facility]

## 2016-05-06 NOTE — Telephone Encounter (Signed)
Pt called in and said that she went to pick up her Jardiance and they didn't have it in stock, she said that it will be six weeks before they will get it in, she wants to know if this is okay. [phone]

## 2016-05-07 ENCOUNTER — Telehealth: Payer: Self-pay

## 2016-05-07 NOTE — Telephone Encounter (Signed)
Called patient and advised of note, patient will take sugars later in the day to see how they are running and will call us with any problems.

## 2016-05-07 NOTE — Telephone Encounter (Signed)
Attempted to contact patient about note on Jardiance medication. No answer, and no voicemail, will try again later.

## 2016-05-07 NOTE — Telephone Encounter (Signed)
OK. Please continue to check sugars later in the day and let me know how they are running so that we can see if she needs anything else before she can start the new med.

## 2016-05-07 NOTE — Telephone Encounter (Signed)
Please advise on how to proceed. Thank you!

## 2016-05-11 ENCOUNTER — Telehealth: Payer: Self-pay | Admitting: Pharmacist

## 2016-05-11 NOTE — Telephone Encounter (Signed)
05/11/16 Called in refill on [employer], they will ship to patient 05/19/16.

## 2016-05-12 ENCOUNTER — Ambulatory Visit
Admission: RE | Admit: 2016-05-12 | Discharge: 2016-05-12 | Disposition: A | Discharge status: Home / Self Care | Payer: Medicaid Other | Source: Ambulatory Visit | Attending: Internal Medicine | Admitting: Internal Medicine

## 2016-05-12 ENCOUNTER — Other Ambulatory Visit: Payer: Medicaid Other

## 2016-05-13 ENCOUNTER — Telehealth: Payer: Self-pay

## 2016-05-13 ENCOUNTER — Other Ambulatory Visit: Payer: Self-pay | Admitting: Internal Medicine

## 2016-05-13 DIAGNOSIS — E041 Nontoxic single thyroid nodule: Secondary | ICD-10-CM

## 2016-05-13 NOTE — Telephone Encounter (Signed)
Called patient and gave [place] results. Patient had no questions or concerns. Patient also agrees to do Bx. No other questions at this time.

## 2016-05-13 NOTE — Telephone Encounter (Signed)
-----   Message from Philemon Kingdom, MD sent at 05/13/2016 12:30 PM EDT ----- Almyra Free, can you please call pt: The thyroid U/S shows several smaller cysts, but there is one larger nodule in the anterior part of the thyroid >> 3.3 cm >> I would suggest to obtain a sample from this to make sure not cancerous. Would she agree with the Bx? This would be done downstairs in [place].

## 2016-05-14 ENCOUNTER — Other Ambulatory Visit: Payer: Self-pay

## 2016-05-14 DIAGNOSIS — E1129 Type 2 diabetes mellitus with other diabetic kidney complication: Secondary | ICD-10-CM

## 2016-05-14 DIAGNOSIS — Z794 Long term (current) use of insulin: Principal | ICD-10-CM

## 2016-05-14 DIAGNOSIS — E0590 Thyrotoxicosis, unspecified without thyrotoxic crisis or storm: Secondary | ICD-10-CM

## 2016-05-15 LAB — COMPREHENSIVE METABOLIC PANEL
ALBUMIN: 3.6 g/dL (ref 3.5–5.5)
ALK PHOS: 96 IU/L (ref 39–117)
ALT: 14 IU/L (ref 0–32)
AST: 10 IU/L (ref 0–40)
Albumin/Globulin Ratio: 1.2 (ref 1.2–2.2)
BUN / CREAT RATIO: 18 (ref 9–23)
BUN: 27 mg/dL — ABNORMAL HIGH (ref 6–24)
Bilirubin Total: 0.3 mg/dL (ref 0.0–1.2)
CO2: 26 mmol/L (ref 18–29)
CREATININE: 1.51 mg/dL — AB (ref 0.57–1.00)
Calcium: 10.1 mg/dL (ref 8.7–10.2)
Chloride: 103 mmol/L (ref 96–106)
GFR calc non Af Amer: 38 mL/min/{1.73_m2} — ABNORMAL LOW (ref >59)
GFR, EST AFRICAN AMERICAN: 44 mL/min/{1.73_m2} — AB (ref >59)
GLOBULIN, TOTAL: 2.9 g/dL (ref 1.5–4.5)
Glucose: 223 mg/dL — ABNORMAL HIGH (ref 65–99)
Potassium: 4 mmol/L (ref 3.5–5.2)
SODIUM: 142 mmol/L (ref 134–144)
TOTAL PROTEIN: 6.5 g/dL (ref 6.0–8.5)

## 2016-05-15 LAB — CBC
HEMATOCRIT: 39.2 % (ref 34.0–46.6)
HEMOGLOBIN: 13.2 g/dL (ref 11.1–15.9)
MCH: 29.1 pg (ref 26.6–33.0)
MCHC: 33.7 g/dL (ref 31.5–35.7)
MCV: 86 fL (ref 79–97)
Platelets: 183 10*3/uL (ref 150–379)
RBC: 4.54 x10E6/uL (ref 3.77–5.28)
RDW: 13.3 % (ref 12.3–15.4)
WBC: 5.8 10*3/uL (ref 3.4–10.8)

## 2016-05-15 LAB — LIPID PANEL
CHOL/HDL RATIO: 3 ratio (ref 0.0–4.4)
CHOLESTEROL TOTAL: 125 mg/dL (ref 100–199)
HDL: 42 mg/dL (ref >39)
LDL Calculated: 7 mg/dL (ref 0–99)
TRIGLYCERIDES: 381 mg/dL — AB (ref 0–149)
VLDL Cholesterol Cal: 76 mg/dL — ABNORMAL HIGH (ref 5–40)

## 2016-05-15 LAB — HEMOGLOBIN A1C
Est. average glucose Bld gHb Est-mCnc: 324 mg/dL
HEMOGLOBIN A1C: 12.9 % — AB (ref 4.8–5.6)

## 2016-05-15 LAB — MICROALBUMIN / CREATININE URINE RATIO
CREATININE, UR: 214.8 mg/dL
Microalb/Creat Ratio: 906 mg/g creat — ABNORMAL HIGH (ref 0.0–30.0)
Microalbumin, Urine: 1946 ug/mL

## 2016-05-21 ENCOUNTER — Telehealth: Payer: Self-pay

## 2016-05-21 ENCOUNTER — Ambulatory Visit: Payer: Self-pay | Admitting: Adult Health Nurse Practitioner

## 2016-05-21 VITALS — BP 123/89 | HR 88 | Temp 98.4°F | Wt 206.2 lb

## 2016-05-21 DIAGNOSIS — E119 Type 2 diabetes mellitus without complications: Secondary | ICD-10-CM

## 2016-05-21 LAB — POCT GLUCOSE (DEVICE FOR HOME USE): POC Glucose: 158 mg/dl — AB (ref 70–99)

## 2016-05-21 MED ORDER — AMLODIPINE BESYLATE 5 MG PO TABS: 5.0000 mg | ORAL_TABLET | Freq: Every day | ORAL | 1 refills | Status: DC

## 2016-05-21 NOTE — Telephone Encounter (Signed)
Received PAP application from [facility] for Chantix placed for provider to sign.

## 2016-05-21 NOTE — Telephone Encounter (Signed)
Placed signed application/script in [facility] folder for pickup. 

## 2016-05-21 NOTE — Progress Notes (Signed)
Patient: Emily [name] Female    DOB: 01-31-1960   56 y.o.   MRN: [id number] Visit Date: 05/21/2016  Today's Provider: Staci Acosta, NP   Chief Complaint  Patient presents with  . Follow-up    Lab followup   Subjective:    HPI  Pt here for review of her BP.  Last visit BP was 145/87. Pt was restarted on Lisinopril 10mg  daily in addition to carvedilol and lisinopril.  Doing well on medication.   Dr. Cruzita Lederer is now managing DM.  Last visit on 5.2.18 with FU in 3 months.  Thyroid scan was ordered- d/c methimazole.  Jardiance was added to regimen (does not have yet- takes 6 weeks per her report)   - continue Lantus 45 units BID and Januvia 50mg  QAM.  Encouraged to check CBGs BID.      No Known Allergies Previous Medications   ALBUTEROL (PROVENTIL HFA;VENTOLIN HFA) 108 (90 BASE) MCG/ACT INHALER    Inhale 2 puffs into the lungs every 6 (six) hours as needed for wheezing or shortness of breath.   AMLODIPINE (NORVASC) 5 MG TABLET    TAKE ONE TABLET BY MOUTH EVERY DAY   ASPIRIN 81 MG TABLET    Take 81 mg by mouth daily.   ATORVASTATIN (LIPITOR) 40 MG TABLET    TAKE ONE TABLET BY MOUTH EVERY DAY   CARVEDILOL (COREG) 25 MG TABLET    TAKE ONE TABLET BY MOUTH 2 TIMES A DAY   CYCLOBENZAPRINE (FLEXERIL) 5 MG TABLET    TAKE ONE TABLET BY MOUTH EVERY DAY AS NEEDED FOR MUSCLE SPASMS   EMPAGLIFLOZIN (JARDIANCE) 10 MG TABS TABLET    Take 10 mg by mouth daily.   GLIPIZIDE (GLUCOTROL) 10 MG TABLET    Take 10 mg by mouth 2 (two) times daily.   INSULIN GLARGINE (LANTUS SOLOSTAR) 100 UNIT/ML SOLOSTAR PEN    Inject 45 Units into the skin 2 (two) times daily.   LIRAGLUTIDE 18 MG/3ML SOPN    Inject 0.3 mLs (1.8 mg total) into the skin every morning.   LISINOPRIL (PRINIVIL,ZESTRIL) 10 MG TABLET    Take 1 tablet (10 mg total) by mouth daily.   SITAGLIPTIN (JANUVIA) 50 MG TABLET    Take 50 mg by mouth daily.   TIOTROPIUM BROMIDE MONOHYDRATE (SPIRIVA RESPIMAT) 1.25 MCG/ACT AERS    Inhale 2 puffs  into the lungs 2 (two) times daily.   VARENICLINE (CHANTIX) 1 MG TABLET    TAKE 1/2 TABLET DAILY FOR 3 DAYS; THEN 1/2 TABLET TWICE DAILY TIMES 4DAYS; THEN 1 TABLET TWICE DAILY THEREAFTER. STOP SMOKING ON DAY 4.    Review of Systems  All other systems reviewed and are negative.   Social History  Substance Use Topics  . Smoking status: Former Smoker    Packs/day: 1.50    Types: Cigarettes    Quit date: 01/13/2016  . Smokeless tobacco: Never Used  . Alcohol use No   Objective:   BP 123/89   Pulse 88   Temp 98.4 F (36.9 C)   Wt 206 lb 3.2 oz (93.5 kg)   BMI 36.53 kg/m   Physical Exam  Constitutional: She appears well-developed and well-nourished.  HENT:  Head: Normocephalic and atraumatic.  Cardiovascular: Normal rate, regular rhythm and normal heart sounds.   Pulmonary/Chest: Effort normal and breath sounds normal.  Abdominal: Soft. Bowel sounds are normal.  Vitals reviewed.       Assessment & Plan:         HTN:  Improved from  last visit.   Goal BP <140/80.  Continue current medication regimen.  Encourage low salt diet and exercise. Increase water intake.    Continue to FU with Dr. Cruzita Lederer for DM and thyroid.  Reviewed labs.     Staci Acosta, NP   [facility]

## 2016-05-22 ENCOUNTER — Telehealth: Payer: Self-pay | Admitting: Pharmacist

## 2016-05-22 NOTE — Telephone Encounter (Signed)
05/22/16 Called [employer] for refill on Januvia 50mg  1 daily.

## 2016-05-25 ENCOUNTER — Other Ambulatory Visit: Payer: Self-pay | Admitting: Urology

## 2016-05-25 ENCOUNTER — Ambulatory Visit: Payer: Self-pay | Admitting: Pharmacist

## 2016-05-25 ENCOUNTER — Encounter: Payer: Self-pay | Admitting: Pharmacist

## 2016-05-25 VITALS — BP 145/80 | Ht 64.0 in | Wt 205.0 lb

## 2016-05-25 DIAGNOSIS — Z79899 Other long term (current) drug therapy: Secondary | ICD-10-CM

## 2016-05-25 NOTE — Patient Instructions (Signed)
-   Fasting blood sugar in morning (70-130) - Blood sugar 2-hours after meals (<180) - Try to limit sugar and carb intake (get rid of one weekly bedtime ice cream sandwich) - Good job on not smoking!!!!!

## 2016-05-25 NOTE — Progress Notes (Signed)
  Medication Management Clinic Visit Note  Patient: Emily [name] MRN: [id number] Date of Birth: [birth year] PCP: Boyce Medici, FNP   [name] 56 y.o. female presents for an annual MTM visit today.  BP (!) 145/80 (BP Location: Left Arm)   Ht 5\' 4"  (1.626 m)   Wt 205 lb (93 kg)   BMI 35.19 kg/m   Patient Information   Past Medical History:  Diagnosis Date  . Asthma   . COPD (chronic obstructive pulmonary disease) ([place])   . Diabetes mellitus without complication ([place])   . Hyperlipidemia   . Hypertension   . Stroke [facility])    Was told at recent hospital visit she has stroke in past  . Thyroid disease   . Thyroid nodule 04/30/2016     History reviewed. No pertinent surgical history.   Family History  Problem Relation Age of Onset  . Diabetes Mother   . Hypertension Mother   . Diabetes Father   . Hypertension Father   . Diabetes Sister     New Diagnoses (since last visit):   Family Support: Good  Lifestyle Diet: Breakfast: sausage/bacon; eggs; grits Lunch: skips Dinner: eats later in the day; chips/ice cream Snack: chips; ice cream Drinks: flavored water; diet sunkist; coffee (sweet & low)    Current Exercise Habits: Home exercise routine, Type of exercise: walking, Time (Minutes): 60, Frequency (Times/Week): 1, Weekly Exercise (Minutes/Week): 60       History  Alcohol Use No      History  Smoking Status  . Former Smoker  . Packs/day: 1.50  . Types: Cigarettes  . Quit date: 01/13/2016  Smokeless Tobacco  . Never Used      Health Maintenance  Topic Date Due  . Hepatitis C Screening  Nov 08, 1960  . FOOT EXAM  11/24/1968  . OPHTHALMOLOGY EXAM  11/24/1968  . TETANUS/TDAP  11/24/1977  . PAP SMEAR  11/25/1979  . MAMMOGRAM  11/24/2008  . COLONOSCOPY  11/24/2008  . INFLUENZA VACCINE  08/05/2016  . HEMOGLOBIN A1C  11/14/2016  . PNEUMOCOCCAL POLYSACCHARIDE VACCINE (2) 04/28/2021  . HIV Screening  Completed     Assessment and Plan: 1.  Diabetes - checks twice a day (fbg 104-202); nighttime 94-336; encouraged patient to check 2 hours after meals - hasn't been having lows - Recent a1c = 12.9  - patient eats chips and ice cream for dinner 7 days a week, encouraged patient to decrease this to maybe 5-6 days a week - patient was unaware of reading food labels, educated patient on how to read food labels and what to look at  2. Hypertension: - bp a little elevated at 145/80 - Not checking at home  - not salting foods, recommended a < 2000 mg sodium diet  3. Hyperthyroidism - patient was recently taken off of her methimazole   4. Smoking - patient quit smoking in January!!!!  Patient brought medications with her to visit. Was not aware of why she was taking most of her medications so discussed in detail. Will see patient back in 4 weeks.  Darrow Bussing, PharmD Pharmacy Resident 05/25/2016 2:55 PM

## 2016-05-26 ENCOUNTER — Other Ambulatory Visit: Payer: Self-pay | Admitting: Adult Health Nurse Practitioner

## 2016-05-26 ENCOUNTER — Telehealth: Payer: Self-pay

## 2016-05-26 DIAGNOSIS — I10 Essential (primary) hypertension: Secondary | ICD-10-CM

## 2016-05-26 MED ORDER — LISINOPRIL 10 MG PO TABS: 10.0000 mg | ORAL_TABLET | Freq: Every day | ORAL | 3 refills | Status: DC

## 2016-05-26 NOTE — Telephone Encounter (Signed)
-----   Message from Tristan Schroeder, [facility] sent at 05/25/2016  2:10 PM EDT ----- Pt was seen last week. She states that she was supposed to continue the lisinopril. We did not rcv a script and I did not see one in [facility]. The previous RX was written for 30 with 0 refills. Please send if appropriate.  Thank you, Netta Neat, PharmD, [facility]) [phone]

## 2016-05-28 ENCOUNTER — Other Ambulatory Visit: Payer: Self-pay | Admitting: Ophthalmology

## 2016-05-28 ENCOUNTER — Ambulatory Visit: Payer: Self-pay | Admitting: Ophthalmology

## 2016-05-28 LAB — HM DIABETES EYE EXAM

## 2016-06-02 ENCOUNTER — Telehealth: Payer: Self-pay | Admitting: Pharmacist

## 2016-06-02 NOTE — Telephone Encounter (Signed)
06/02/16 Faxed [employer] application for [employer] month-Take .05 mg by mouth once daily for 3 days, then take .05 mg by mouth twice daily for 4 days, then take 1 tablet twice daily, Stop smoking on day 4. Also Chantix 1 mg Take 1 tablet by mouth two times daily.

## 2016-06-11 ENCOUNTER — Telehealth: Payer: Self-pay

## 2016-06-11 NOTE — Telephone Encounter (Signed)
Received PAP application from [facility] for Lantus placed for provider to sign. 

## 2016-06-17 ENCOUNTER — Telehealth: Payer: Self-pay | Admitting: Nurse Practitioner

## 2016-06-17 ENCOUNTER — Telehealth: Payer: Self-pay

## 2016-06-17 NOTE — Telephone Encounter (Signed)
Patient is calling b/c she would like to resched imaging on 06/20 to 06/21.  Please advise.  Thank you,  -LL

## 2016-06-17 NOTE — Telephone Encounter (Signed)
Called patient and advised to call [facility] to reschedule.

## 2016-06-17 NOTE — Telephone Encounter (Signed)
Called patient and advised to call [facility] to reschedule.

## 2016-06-24 ENCOUNTER — Other Ambulatory Visit: Payer: Self-pay

## 2016-06-24 NOTE — Telephone Encounter (Signed)
Placed signed application/script in [facility] folder for pickup. 

## 2016-06-25 ENCOUNTER — Ambulatory Visit
Admission: RE | Admit: 2016-06-25 | Discharge: 2016-06-25 | Disposition: A | Discharge status: Home / Self Care | Payer: Self-pay | Source: Ambulatory Visit | Attending: Internal Medicine | Admitting: Internal Medicine

## 2016-06-25 ENCOUNTER — Other Ambulatory Visit (HOSPITAL_COMMUNITY)
Admission: RE | Admit: 2016-06-25 | Discharge: 2016-06-25 | Disposition: A | Discharge status: Home / Self Care | Payer: Medicaid Other | Source: Ambulatory Visit | Attending: Radiology | Admitting: Radiology

## 2016-06-25 DIAGNOSIS — E041 Nontoxic single thyroid nodule: Secondary | ICD-10-CM | POA: Diagnosis present

## 2016-06-30 ENCOUNTER — Telehealth: Payer: Self-pay | Admitting: Pharmacist

## 2016-06-30 NOTE — Telephone Encounter (Signed)
06/30/16 Faxed [employer] refill request and page 1 of application due to change in provider- Lantus Solostar Inject 45 units into the skin two times a day-Dose Change.[name]

## 2016-07-01 ENCOUNTER — Other Ambulatory Visit: Payer: Self-pay | Admitting: Internal Medicine

## 2016-07-16 ENCOUNTER — Other Ambulatory Visit: Payer: Self-pay | Admitting: Adult Health Nurse Practitioner

## 2016-07-16 MED ORDER — AMLODIPINE BESYLATE 5 MG PO TABS: 5.0000 mg | ORAL_TABLET | Freq: Every day | ORAL | 1 refills | Status: DC

## 2016-07-16 MED ORDER — ALBUTEROL SULFATE HFA 108 (90 BASE) MCG/ACT IN AERS: 1.0000 | INHALATION_SPRAY | Freq: Four times a day (QID) | RESPIRATORY_TRACT | 11 refills | Status: AC | PRN

## 2016-08-13 ENCOUNTER — Emergency Department
Admission: EM | Admit: 2016-08-13 | Discharge: 2016-08-13 | Disposition: A | Discharge status: Home / Self Care | Payer: Medicaid Other | Attending: Emergency Medicine | Admitting: Emergency Medicine

## 2016-08-13 ENCOUNTER — Emergency Department: Payer: Medicaid Other

## 2016-08-13 ENCOUNTER — Encounter: Payer: Self-pay | Admitting: Medical Oncology

## 2016-08-13 DIAGNOSIS — E1122 Type 2 diabetes mellitus with diabetic chronic kidney disease: Secondary | ICD-10-CM | POA: Diagnosis not present

## 2016-08-13 DIAGNOSIS — J45909 Unspecified asthma, uncomplicated: Secondary | ICD-10-CM | POA: Diagnosis not present

## 2016-08-13 DIAGNOSIS — M79605 Pain in left leg: Secondary | ICD-10-CM | POA: Diagnosis present

## 2016-08-13 DIAGNOSIS — Z87891 Personal history of nicotine dependence: Secondary | ICD-10-CM | POA: Insufficient documentation

## 2016-08-13 DIAGNOSIS — Z794 Long term (current) use of insulin: Secondary | ICD-10-CM | POA: Diagnosis not present

## 2016-08-13 DIAGNOSIS — Z7984 Long term (current) use of oral hypoglycemic drugs: Secondary | ICD-10-CM | POA: Diagnosis not present

## 2016-08-13 DIAGNOSIS — I129 Hypertensive chronic kidney disease with stage 1 through stage 4 chronic kidney disease, or unspecified chronic kidney disease: Secondary | ICD-10-CM | POA: Diagnosis not present

## 2016-08-13 DIAGNOSIS — I8390 Asymptomatic varicose veins of unspecified lower extremity: Secondary | ICD-10-CM | POA: Diagnosis not present

## 2016-08-13 DIAGNOSIS — J449 Chronic obstructive pulmonary disease, unspecified: Secondary | ICD-10-CM | POA: Insufficient documentation

## 2016-08-13 DIAGNOSIS — Z7982 Long term (current) use of aspirin: Secondary | ICD-10-CM | POA: Diagnosis not present

## 2016-08-13 DIAGNOSIS — N189 Chronic kidney disease, unspecified: Secondary | ICD-10-CM | POA: Insufficient documentation

## 2016-08-13 DIAGNOSIS — Z79899 Other long term (current) drug therapy: Secondary | ICD-10-CM | POA: Diagnosis not present

## 2016-08-13 LAB — COMPREHENSIVE METABOLIC PANEL
ALBUMIN: 3.6 g/dL (ref 3.5–5.0)
ALT: 21 U/L (ref 14–54)
AST: 18 U/L (ref 15–41)
Alkaline Phosphatase: 113 U/L (ref 38–126)
Anion gap: 7 (ref 5–15)
BILIRUBIN TOTAL: 0.6 mg/dL (ref 0.3–1.2)
BUN: 18 mg/dL (ref 6–20)
CO2: 29 mmol/L (ref 22–32)
CREATININE: 1.33 mg/dL — AB (ref 0.44–1.00)
Calcium: 9 mg/dL (ref 8.9–10.3)
Chloride: 99 mmol/L — ABNORMAL LOW (ref 101–111)
GFR calc Af Amer: 50 mL/min — ABNORMAL LOW (ref >60)
GFR calc non Af Amer: 43 mL/min — ABNORMAL LOW (ref >60)
GLUCOSE: 437 mg/dL — AB (ref 65–99)
POTASSIUM: 3.2 mmol/L — AB (ref 3.5–5.1)
Sodium: 135 mmol/L (ref 135–145)
TOTAL PROTEIN: 7.5 g/dL (ref 6.5–8.1)

## 2016-08-13 LAB — CBC WITH DIFFERENTIAL/PLATELET
BASOS ABS: 0.1 10*3/uL (ref 0–0.1)
Basophils Relative: 1 %
EOS PCT: 5 %
Eosinophils Absolute: 0.3 10*3/uL (ref 0–0.7)
HCT: 45.6 % (ref 35.0–47.0)
Hemoglobin: 15 g/dL (ref 12.0–16.0)
LYMPHS ABS: 2.3 10*3/uL (ref 1.0–3.6)
LYMPHS PCT: 40 %
MCH: 28.8 pg (ref 26.0–34.0)
MCHC: 32.9 g/dL (ref 32.0–36.0)
MCV: 87.5 fL (ref 80.0–100.0)
MONO ABS: 0.5 10*3/uL (ref 0.2–0.9)
Monocytes Relative: 9 %
Neutro Abs: 2.6 10*3/uL (ref 1.4–6.5)
Neutrophils Relative %: 45 %
PLATELETS: 157 10*3/uL (ref 150–440)
RBC: 5.21 MIL/uL — ABNORMAL HIGH (ref 3.80–5.20)
RDW: 12.5 % (ref 11.5–14.5)
WBC: 5.8 10*3/uL (ref 3.6–11.0)

## 2016-08-13 LAB — APTT: aPTT: 27 seconds (ref 24–36)

## 2016-08-13 LAB — PROTIME-INR
INR: 0.94
Prothrombin Time: 12.6 seconds (ref 11.4–15.2)

## 2016-08-13 MED ORDER — PREDNISONE 50 MG PO TABS: 50.0000 mg | ORAL_TABLET | Freq: Every day | ORAL | 0 refills | Status: DC

## 2016-08-13 NOTE — ED Provider Notes (Signed)
[facility] Emergency Department Provider Note  ____________________________________________  Time seen: Approximately 5:36 PM  I have reviewed the triage vital signs and the nursing notes.   HISTORY  Chief Complaint Abscess    HPI Emily [name] is a 56 y.o. female who presents emergency department complaining of left lower extremity pain. Patient reports that over the past 2 weeks she has developed left lower leg pain and swelling when compared right. Patient also reports a "mass" to the medial aspect of the left lower leg. Patient reports it is exquisitely tender to palpation. She denies any erythema. She denies any drainage from the site. Patient denies any fevers or chills, headache, visual changes, chest pain, shortness of breath, abdominal pain, nausea vomiting. Patient reports that she has a history of "stroke" but is unsure when this occurred. She is unsure whether this was hemorrhagic or ischemic in nature. Patient takes daily aspirin but does not take any other blood thinners. No history of recurrent skin lesions.  Patient also has a history of COPD, diabetes, hypertension. Patient denies any complaints of these chronic medical conditions at this time.   Past Medical History:  Diagnosis Date  . Asthma   . COPD (chronic obstructive pulmonary disease) ([place])   . Diabetes mellitus without complication ([place])   . Hyperlipidemia   . Hypertension   . Stroke [facility])    Was told at recent hospital visit she has stroke in past  . Thyroid disease   . Thyroid nodule 04/30/2016    Patient Active Problem List   Diagnosis Date Noted  . Thyroid nodule 04/30/2016  . Left sided numbness 04/27/2016  . Muscle spasm of back 11/05/2015  . Hyperthyroidism 03/13/2015  . Goiter diffuse 03/13/2015  . Poorly controlled type 2 diabetes mellitus with circulatory disorder ([place]) 02/07/2015  . Essential hypertension 12/27/2014  . Chronic kidney disease 12/27/2014  .  Tobacco abuse 12/27/2014    No past surgical history on file.  Prior to Admission medications   Medication Sig Start Date End Date Taking? Authorizing Provider  albuterol (PROVENTIL HFA;VENTOLIN HFA) 108 (90 Base) MCG/ACT inhaler Inhale 1-2 puffs into the lungs every 6 (six) hours as needed for wheezing or shortness of breath. 07/16/16   Doles-Johnson, Teah, NP  amLODipine (NORVASC) 5 MG tablet Take 1 tablet (5 mg total) by mouth daily. 07/16/16   Doles-Johnson, Teah, NP  aspirin 81 MG tablet Take 81 mg by mouth daily.    [provider]  atorvastatin (LIPITOR) 40 MG tablet TAKE ONE TABLET BY MOUTH EVERY DAY 04/21/16   Tawni Millers, MD  carvedilol (COREG) 25 MG tablet TAKE ONE TABLET BY MOUTH 2 TIMES A DAY 03/12/16   McGowan, Larene [place] A, PA-C  cyclobenzaprine (FLEXERIL) 5 MG tablet TAKE ONE TABLET BY MOUTH EVERY DAY AS NEEDED FOR MUSCLE SPASMS 04/20/16   Doles-Johnson, Teah, NP  empagliflozin (JARDIANCE) 10 MG TABS tablet Take 10 mg by mouth daily. Patient not taking: Reported on 05/21/2016 05/06/16   Philemon Kingdom, MD  glipiZIDE (GLUCOTROL) 10 MG tablet Take 10 mg by mouth 2 (two) times daily.    [provider]  glipiZIDE (GLUCOTROL) 10 MG tablet TAKE ONE TABLET BY MOUTH 2 TIMES A DAY 07/01/16   Doles-Johnson, Teah, NP  Insulin Glargine (LANTUS SOLOSTAR) 100 UNIT/ML Solostar Pen Inject 45 Units into the skin 2 (two) times daily. 01/30/16   Jerrol Banana., MD  lisinopril (PRINIVIL,ZESTRIL) 10 MG tablet Take 1 tablet (10 mg total) by mouth daily. 05/26/16  Doles-Johnson, Teah, NP  predniSONE (DELTASONE) 50 MG tablet Take 1 tablet (50 mg total) by mouth daily with breakfast. 08/13/16   Cuthriell, Charline Bills, PA-C  sitaGLIPtin (JANUVIA) 50 MG tablet Take 50 mg by mouth daily. 07/24/14   [provider]  tiotropium (SPIRIVA) 18 MCG inhalation capsule Place 18 mcg into inhaler and inhale daily.    [provider]  Tiotropium Bromide Monohydrate (SPIRIVA RESPIMAT)  1.25 MCG/ACT AERS Inhale 2 puffs into the lungs 2 (two) times daily.    [provider]    Allergies Patient has no known allergies.  Family History  Problem Relation Age of Onset  . Diabetes Mother   . Hypertension Mother   . Diabetes Father   . Hypertension Father   . Diabetes Sister     Social History Social History  Substance Use Topics  . Smoking status: Former Smoker    Packs/day: 1.50    Types: Cigarettes    Quit date: 01/13/2016  . Smokeless tobacco: Never Used  . Alcohol use No     Review of Systems  Constitutional: No fever/chills Eyes: No visual changes. No discharge ENT: No upper respiratory complaints. Cardiovascular: no chest pain. Respiratory: no cough. No SOB. Gastrointestinal: No abdominal pain.  No nausea, no vomiting.  No diarrhea.  No constipation. Musculoskeletal: Positive for left lower extremity pain and edema. Skin: Negative for rash, abrasions, lacerations, ecchymosis. Positive for "mass" to the medial aspect of the left lower leg. Neurological: Negative for headaches, focal weakness or numbness. 10-point ROS otherwise negative.  ____________________________________________   PHYSICAL EXAM:  VITAL SIGNS: ED Triage Vitals  Enc Vitals Group     BP 08/13/16 1640 (!) 168/89     Pulse Rate 08/13/16 1640 78     Resp 08/13/16 1640 17     Temp 08/13/16 1640 98.4 F (36.9 C)     Temp Source 08/13/16 1640 Oral     SpO2 08/13/16 1640 99 %     Weight 08/13/16 1638 205 lb (93 kg)     Height --      Head Circumference --      Peak Flow --      Pain Score 08/13/16 1637 7     Pain Loc --      Pain Edu? --      Excl. in [place]? --      Constitutional: Alert and oriented. Well appearing and in no acute distress. Eyes: Conjunctivae are normal. PERRL. EOMI. Head: Atraumatic. ENT:      Ears:       Nose: No congestion/rhinnorhea.      Mouth/Throat: Mucous membranes are moist.  Neck: No stridor.    Cardiovascular: Normal rate, regular  rhythm. Normal S1 and S2.  Good peripheral circulation. Respiratory: Normal respiratory effort without tachypnea or retractions. Lungs CTAB. Good air entry to the bases with no decreased or absent breath sounds. Musculoskeletal: Full range of motion to all extremities. No gross deformities appreciated.Mild edema of the left lower extremity when compared with right lower extremity. Dorsalis pedis pulse intact. Sensation intact all dermatomal distributions. Extremity is warm to palpation but not appreciably different than unaffected extremity. Neurologic:  Normal speech and language. No gross focal neurologic deficits are appreciated.  Skin:  Skin is warm, dry and intact. No rash noted. No erythema noted to skin of both lower extremity. Patient has a palpable mass to the medial aspect of the left lower extremity. This is approximately mid shaft tibial region. Area is very  tender to palpation. No fluctuance or induration. Area is hard to palpation, approximately 5 cm at its widest margins. Margins are irregular in nature. Psychiatric: Mood and affect are normal. Speech and behavior are normal. Patient exhibits appropriate insight and judgement.   ____________________________________________   LABS (all labs ordered are listed, but only abnormal results are displayed)  Labs Reviewed  COMPREHENSIVE METABOLIC PANEL - Abnormal; Notable for the following:       Result Value   Potassium 3.2 (*)    Chloride 99 (*)    Glucose, Bld 437 (*)    Creatinine, Ser 1.33 (*)    GFR calc non Af Amer 43 (*)    GFR calc Af Amer 50 (*)    All other components within normal limits  CBC WITH DIFFERENTIAL/PLATELET - Abnormal; Notable for the following:    RBC 5.21 (*)    All other components within normal limits  APTT  PROTIME-INR   ____________________________________________  EKG   ____________________________________________  RADIOLOGY Diamantina [place] Cuthriell, personally viewed and evaluated these  images (plain radiographs) as part of my medical decision making, as well as reviewing the written report by the radiologist.  US Venous Img Lower Unilateral Left  Result Date: 08/13/2016 CLINICAL DATA:  Left lower extremity pain x2 weeks. Edema and enlarging mass along the medial left leg. EXAM: LEFT LOWER EXTREMITY VENOUS DOPPLER ULTRASOUND TECHNIQUE: Gray-scale sonography with graded compression, as well as color Doppler and duplex ultrasound were performed to evaluate the lower extremity deep venous systems from the level of the common femoral vein and including the common femoral, femoral, profunda femoral, popliteal and calf veins including the posterior tibial, peroneal and gastrocnemius veins when visible. The superficial great saphenous vein was also interrogated. Spectral Doppler was utilized to evaluate flow at rest and with distal augmentation maneuvers in the common femoral, femoral and popliteal veins. COMPARISON:  None. FINDINGS: Contralateral Common Femoral Vein: Respiratory phasicity is normal and symmetric with the symptomatic side. No evidence of thrombus. Normal compressibility. Common Femoral Vein: No evidence of thrombus. Normal compressibility, respiratory phasicity and response to augmentation. Saphenofemoral Junction: No evidence of thrombus. Normal compressibility and flow on color Doppler imaging. Profunda Femoral Vein: No evidence of thrombus. Normal compressibility and flow on color Doppler imaging. Femoral Vein: No evidence of thrombus. Normal compressibility, respiratory phasicity and response to augmentation. Popliteal Vein: No evidence of thrombus. Normal compressibility, respiratory phasicity and response to augmentation. Calf Veins: No evidence of thrombus. Normal compressibility and flow on color Doppler imaging. Superficial Great Saphenous Vein: No evidence of thrombus. Normal compressibility and flow on color Doppler imaging. Venous Reflux:  None. Other Findings: At the site  of enlarging palpable area within the medial aspect of the left leg are varicose veins that appear to compress and are not thrombosed. There is mild soft tissue edema within the left leg as well. IMPRESSION: No evidence of DVT within the left lower extremity. Left leg edema. The palpable area in question along the medial left leg may correspond with a varicose vein, not thrombosed in appearance. Electronically Signed   By: Ashley Royalty M.D.   On: 08/13/2016 19:25    ____________________________________________    PROCEDURES  Procedure(s) performed:    Procedures    Medications - No data to display   ____________________________________________   INITIAL IMPRESSION / ASSESSMENT AND PLAN / ED COURSE  Pertinent labs & imaging results that were available during my care of the patient were reviewed by me and considered in my  medical decision making (see chart for details).  Review of the [place] CSRS was performed in accordance of the [place] prior to dispensing any controlled drugs.     Patient's diagnosis is consistent with A large varicose vein causing pain to the left lower extremity. Patient presented with mild edema of the left lower leg with increasing pain and palpable abnormality to the medial left lower leg. Patient was evaluated with labs and ultrasound. No DVT. No thrombosed varicose vein. Area is consistent with localized edema from compressed varicose vein. At this time, patient will be placed on shorts steroid taper for inflammation control as she is diabetic as well as suffering from chronic kidney disease. Patient is also encouraged to walk and use compression stockings. If symptoms persist or worsen, she will follow up with vascular surgery..  Patient is given ED precautions to return to the ED for any worsening or new symptoms.     ____________________________________________  FINAL CLINICAL IMPRESSION(S) / ED DIAGNOSES  Final diagnoses:  Varicose vein of leg       NEW MEDICATIONS STARTED DURING THIS VISIT:  New Prescriptions   PREDNISONE (DELTASONE) 50 MG TABLET    Take 1 tablet (50 mg total) by mouth daily with breakfast.        This chart was dictated using voice recognition software/Dragon. Despite best efforts to proofread, errors can occur which can change the meaning. Any change was purely unintentional.    Darletta Moll, PA-C 08/13/16 1941    Earleen Newport, MD 08/13/16 [phone]

## 2016-08-13 NOTE — ED Notes (Signed)
Report off to kasey rn  

## 2016-08-13 NOTE — ED Notes (Signed)
Pt has abscess to left lower leg.  Sx for 2 weeks.  No drainage. Area tender to touch .  Pt alert.

## 2016-08-13 NOTE — ED Triage Notes (Signed)
Abscess to left lower leg for about 2 weeks.

## 2016-08-20 ENCOUNTER — Ambulatory Visit (INDEPENDENT_AMBULATORY_CARE_PROVIDER_SITE_OTHER): Payer: Medicaid Other | Admitting: Internal Medicine

## 2016-08-20 ENCOUNTER — Other Ambulatory Visit: Payer: Self-pay

## 2016-08-20 ENCOUNTER — Encounter: Payer: Self-pay | Admitting: Internal Medicine

## 2016-08-20 ENCOUNTER — Telehealth: Payer: Self-pay

## 2016-08-20 ENCOUNTER — Telehealth: Payer: Self-pay | Admitting: Internal Medicine

## 2016-08-20 VITALS — BP 124/82 | HR 75 | Ht 64.5 in | Wt 187.0 lb

## 2016-08-20 DIAGNOSIS — E0590 Thyrotoxicosis, unspecified without thyrotoxic crisis or storm: Secondary | ICD-10-CM

## 2016-08-20 DIAGNOSIS — E108 Type 1 diabetes mellitus with unspecified complications: Secondary | ICD-10-CM | POA: Diagnosis not present

## 2016-08-20 DIAGNOSIS — E041 Nontoxic single thyroid nodule: Secondary | ICD-10-CM | POA: Diagnosis not present

## 2016-08-20 DIAGNOSIS — E1159 Type 2 diabetes mellitus with other circulatory complications: Secondary | ICD-10-CM | POA: Diagnosis not present

## 2016-08-20 DIAGNOSIS — E049 Nontoxic goiter, unspecified: Secondary | ICD-10-CM | POA: Diagnosis not present

## 2016-08-20 DIAGNOSIS — E1165 Type 2 diabetes mellitus with hyperglycemia: Secondary | ICD-10-CM

## 2016-08-20 DIAGNOSIS — E040 Nontoxic diffuse goiter: Secondary | ICD-10-CM

## 2016-08-20 LAB — T3, FREE: T3, Free: 3.5 pg/mL (ref 2.3–4.2)

## 2016-08-20 LAB — POCT GLYCOSYLATED HEMOGLOBIN (HGB A1C)

## 2016-08-20 LAB — TSH: TSH: 0.54 u[IU]/mL (ref 0.35–4.50)

## 2016-08-20 LAB — T4, FREE: Free T4: 1.1 ng/dL (ref 0.60–1.60)

## 2016-08-20 MED ORDER — INSULIN GLARGINE 100 UNIT/ML SOLOSTAR PEN: 50.0000 [IU] | PEN_INJECTOR | Freq: Two times a day (BID) | SUBCUTANEOUS | 11 refills | Status: DC

## 2016-08-20 NOTE — Telephone Encounter (Signed)
Called pharmacy and advised. They took verbal and discarded other information. Patient will receive correct insulin.

## 2016-08-20 NOTE — Telephone Encounter (Signed)
Called in RX 

## 2016-08-20 NOTE — Patient Instructions (Signed)
Please restart: - Lantus 50 units 2x a day.  Please continue: - Glipizide 10 mg 2x a day  Restart: - Januvia 50 mg in am  Please return in 1.5 months with your sugar log.

## 2016-08-20 NOTE — Progress Notes (Addendum)
Patient ID: Emily [name], female   DOB: August 16, 1960, 56 y.o.   MRN: [id number]   HPI  Emily [name] is a 56 y.o.-year-old female, returning for f/u for thyrotoxicosis/goiter and would also want me to manage her DM2, uncontrolled, insulin-dependent, with complications (CKD stage 3, cerebro-vascular ds. - s/p stroke). Last visit 3 mo ago.  TMNG/Graves ds.: Reviewed and addended hx: Pt was dx'ed with hyperthyroidism in ~2014 >> started MMI, lately 10 mg 2x daily. She was lost for f/u at [facility] as she could not travel over there.  Thyroid uptake and scan (06/22/2013) showed: FINDINGS: The 24 hour uptake was 24.7%. The normal reference range at 24 hours is 15 to 35%.   The thyroid scan demonstrates symmetric, mildly heterogenous distribution of radiopharmaceutical throughout the thyroid, which is increased in size.  IMPRESSION: Mildly heterogenous iodine uptake, cannot exclude toxic multinodular goiter versus possible mild Graves disease. Consider ultrasound for further correlation.  Patient was on methimazole before, however, this was stopped after  TSH returned very elevated, in the 90s (02/2015) >> TSH decreased to 8, then normalized >> we continued her off MMI.   However, at last visit, she was started on MMI 10 mg 2x a day (re-Rx by Dr. Rosanna Randy: 04/17/2016, [facility]) - I do not think he was aware that we had stopped MMI.  At last visit, I advised her to stop the methimazole and return for another blood check in 5 weeks, but she did not do so... She is off the med, though.  I reviewed pt's thyroid tests: Lab Results  Component Value Date   TSH 2.840 01/14/2016   TSH 3.20 12/10/2015   TSH 7.49 (H) 11/07/2015   TSH 2.35 07/31/2015   TSH 2.57 06/13/2015   TSH 8.090 (H) 04/09/2015   TSH 94.49 (A) 02/28/2015   FREET4 0.47 (L) 12/10/2015   FREET4 0.32 (L) 11/07/2015   FREET4 0.62 07/31/2015   FREET4 0.57 (L) 06/13/2015   FREET4 0.30 (L) 04/09/2015   Previously:   We  checked  a Thyroid U/S (05/12/2016): Thyroid tissue is diffusely heterogeneous and enlarged.  The isthmus has a large isoechoic nodular structure. Unclear if this represents a discrete nodule versus focal heterogeneous thyroid tissue. This area measures up to 3.3 cm and recommend biopsy of this area.  Numerous small nodules and cysts throughout the left and right thyroid lobes. These predominantly cystic nodules do not meet criteria for biopsy or dedicated follow-up.  FNA of the isthmic nodule (06/25/2016): benign  Pt denies: - + feeling nodules in neck - + hoarseness - dysphagia - choking - SOB with lying down  Pt does have a FH of thyroid ds.: aunt. No FH of thyroid cancer. No h/o radiation tx to head or neck.  No seaweed or kelp. No recent contrast studies. No herbal supplements. No Biotin use. No recent steroids use.   DM2: - very poorly controlled, h/o noncompliance -  with complications:  h/o stroke, CKD  Her last HbA1c was very high:  Lab Results  Component Value Date   HGBA1C 12.9 (H) 05/14/2016   HGBA1C 13.8 (H) 04/28/2016   HGBA1C 10.3 (H) 01/14/2016   She is on: - Lantus 45 units 2x a day >> was off insulin - "the pharmacy did not receive it" - Januvia 50 mg in am >> stopped (?) - Glipizide 10 mg 2x a day >> started in the hospital We added Jardiance at last visit >> not taking  - "the pharmacy did not receive  it" She was on Metformin >> diarrhea.  She checks sugars 2x a day (only on Glipizide): - am: 126-162 in last 2 weeks - after her discharge, prev. 200-423 >> 299-449 - 2h after b'fast: n/c - lunch: n/c - 2h after lunch: n/c - dinner: n/c - 2h after dinner: n/c - bedtime: n/c >> 361-595  She has CKD: Lab Results  Component Value Date   BUN 18 08/13/2016   Lab Results  Component Value Date   CREATININE 1.33 (H) 08/13/2016  On Lisinopril 10 mg daily.  She has HL: Lab Results  Component Value Date   CHOL 125 05/14/2016   HDL 42 05/14/2016     LDLCALC 7 05/14/2016   TRIG 381 (H) 05/14/2016   CHOLHDL 3.0 05/14/2016  On Atorvastatin 40 mg.  She had an eye exam: 05/2016 >> NO DR.  ROS: Constitutional: no weight gain/no weight loss, no fatigue, no subjective hyperthermia, no subjective hypothermia, + dysuria Eyes: no blurry vision, no xerophthalmia ENT: no sore throat, no nodules palpated in throat, no dysphagia, no odynophagia, no hoarseness Cardiovascular: no CP/no SOB/no palpitations/no leg swelling Respiratory: no cough/no SOB/no wheezing Gastrointestinal: no N/no V/no D/no C/+ acid reflux Musculoskeletal: no muscle aches/no joint aches Skin: no rashes, no hair loss Neurological: no tremors/no numbness/no tingling/no dizziness  I reviewed pt's medications, allergies, PMH, social hx, family hx, and changes were documented in the history of present illness. Otherwise, unchanged from my initial visit note.   Past Medical History:  Diagnosis Date  . Asthma   . COPD (chronic obstructive pulmonary disease) ([place])   . Diabetes mellitus without complication ([place])   . Hyperlipidemia   . Hypertension   . Stroke [facility])    Was told at recent hospital visit she has stroke in past  . Thyroid disease   . Thyroid nodule 04/30/2016   No past surgical history on file. Social History   Social History  . Marital Status: Single    Spouse Name: N/A  . Number of Children: 1   Occupational History  . None   Social History Main Topics  . Smoking status: Current Every Day Smoker -- 1.00 packs/day  . Smokeless tobacco: Not on file  . Alcohol Use: No  . Drug Use: No   Current Outpatient Prescriptions on File Prior to Visit  Medication Sig Dispense Refill  . albuterol (PROVENTIL HFA;VENTOLIN HFA) 108 (90 Base) MCG/ACT inhaler Inhale 1-2 puffs into the lungs every 6 (six) hours as needed for wheezing or shortness of breath. 1 Inhaler 11  . amLODipine (NORVASC) 5 MG tablet Take 1 tablet (5 mg total) by mouth daily. 90 tablet 1  .  aspirin 81 MG tablet Take 81 mg by mouth daily.    Marland Kitchen atorvastatin (LIPITOR) 40 MG tablet TAKE ONE TABLET BY MOUTH EVERY DAY 90 tablet PRN  . carvedilol (COREG) 25 MG tablet TAKE ONE TABLET BY MOUTH 2 TIMES A DAY 180 tablet 1  . cyclobenzaprine (FLEXERIL) 5 MG tablet TAKE ONE TABLET BY MOUTH EVERY DAY AS NEEDED FOR MUSCLE SPASMS 30 tablet 0  . glipiZIDE (GLUCOTROL) 10 MG tablet TAKE ONE TABLET BY MOUTH 2 TIMES A DAY 180 tablet 0  . lisinopril (PRINIVIL,ZESTRIL) 10 MG tablet Take 1 tablet (10 mg total) by mouth daily. 30 tablet 3  . sitaGLIPtin (JANUVIA) 50 MG tablet Take 50 mg by mouth daily.    Marland Kitchen tiotropium (SPIRIVA) 18 MCG inhalation capsule Place 18 mcg into inhaler and inhale daily.    Marland Kitchen  Tiotropium Bromide Monohydrate (SPIRIVA RESPIMAT) 1.25 MCG/ACT AERS Inhale 2 puffs into the lungs 2 (two) times daily.    . empagliflozin (JARDIANCE) 10 MG TABS tablet Take 10 mg by mouth daily. (Patient not taking: Reported on 05/21/2016) 30 tablet 3  . Insulin Glargine (LANTUS SOLOSTAR) 100 UNIT/ML Solostar Pen Inject 45 Units into the skin 2 (two) times daily. (Patient not taking: Reported on 08/20/2016) 30 mL 6  . predniSONE (DELTASONE) 50 MG tablet Take 1 tablet (50 mg total) by mouth daily with breakfast. (Patient not taking: Reported on 08/20/2016) 5 tablet 0   No current facility-administered medications on file prior to visit.    No Known Allergies Family History  Problem Relation Age of Onset  . Diabetes Mother   . Hypertension Mother   . Diabetes Father   . Hypertension Father   . Diabetes Sister    PE: BP 124/82 (BP Location: Left Arm, Patient Position: Sitting)   Pulse 75   Ht 5' 4.5" (1.638 m)   Wt 187 lb (84.8 kg)   SpO2 94%   BMI 31.60 kg/m  Body mass index is 31.6 kg/m.  Wt Readings from Last 3 Encounters:  08/20/16 187 lb (84.8 kg)  08/13/16 205 lb (93 kg)  05/25/16 205 lb (93 kg)   Constitutional: overweight, in NAD Eyes: PERRLA, EOMI, no exophthalmos ENT: moist mucous  membranes, + L>R  Thyromegaly, no cervical lymphadenopathy Cardiovascular: RRR, No MRG Respiratory: CTA B Gastrointestinal: abdomen soft, NT, ND, BS+ Musculoskeletal: no deformities, strength intact in all 4 Skin: moist, warm, no rashes Neurological: no tremor with outstretched hands, DTR normal in all 4   ASSESSMENT: 1. Thyrotoxicosis  2. Large goiter US SOFT TISSUE HEAD AND NECK  Order: [id number]  Status:  Final result Visible to patient:  No (Not Released) Dx:  Goiter diffuse  Details   Reading Physician Reading Date Result Priority  Markus Daft, MD 05/12/2016   Narrative    CLINICAL DATA: Thyromegaly on previous chest CT.  EXAM: THYROID ULTRASOUND  TECHNIQUE: Ultrasound examination of the thyroid gland and adjacent soft tissues was performed.  COMPARISON: Chest CT 01/14/2016  FINDINGS: Parenchymal Echotexture: Moderately heterogenous  Isthmus: 1.2 cm in the AP dimension  Right lobe: 8.4 x 2.8 x 4.5 cm  Left lobe: 8.2 x 3.0 x 3.7 cm  _________________________________________________________  Estimated total number of nodules >/= 1 cm: 6-10  Number of spongiform nodules >/= 2 cm not described below (TR1): 0  Number of mixed cystic and solid nodules >/= 1.5 cm not described below ([place]): 0  _________________________________________________________  Nodule # 1:  Location: Isthmus; Inferior  Maximum size: 3.3 cm; Other 2 dimensions: 1.7 x 2.7 cm  Composition: solid/almost completely solid (2)  Echogenicity: isoechoic (1)  **Given size (>/= 2.5 cm) and appearance, fine needle aspiration of this mildly suspicious nodule should be considered based on TI-RADS criteria.  _________________________________________________________  Mildly complex cystic structure in the superior right thyroid lobe measuring up to 1.0 cm. Minimally complex cyst in the superior right thyroid lobe measuring up to 1.6 cm. Adjacent complex cysts or spongiform nodules in the  mid right thyroid lobe, largest measuring 1.1 cm. Spongiform nodule in the mid left thyroid lobe measuring up to 1.3 cm.  Nodule # 2:  Location: Left; Mid  Maximum size: 2.0 cm; Other 2 dimensions: 0.8 x 0.8 cm  Composition: mixed cystic and solid (1)  Echogenicity: cannot determine (1)  This nodule does NOT meet TI-RADS criteria for biopsy or dedicated follow-up.  _________________________________________________________  IMPRESSION: Thyroid tissue is diffusely heterogeneous and enlarged.  The isthmus has a large isoechoic nodular structure. Unclear if this represents a discrete nodule versus focal heterogeneous thyroid tissue. This area measures up to 3.3 cm and recommend biopsy of this area.  Numerous small nodules and cysts throughout the left and right thyroid lobes. These predominantly cystic nodules do not meet criteria for biopsy or dedicated follow-up.  The above is in keeping with the ACR TI-RADS recommendations - J Am Coll Radiol 2017;14:587-595.   Electronically Signed By: Markus Daft M.D. On: 05/12/2016 12:03        Addendum: Adequacy Reason Satisfactory For Evaluation. Diagnosis THYROID, FINE NEEDLE ASPIRATION ISTHMUS INFERIOR (SPECIMEN 1 OF 1, COLLECTED ON 06/25/2016): CONSISTENT WITH BENIGN FOLLICULAR NODULE (B[place] CATEGORY II). Casimer Lanius MD Pathologist, Electronic Signature (Case signed 06/26/2016) Specimen Clinical Information Nodule 1 Isthmus Inferior 3.3 cm; Other 2 dimensions: 1.7 x 2.7 cm, Solid / almost completely solid, Isoechoic, ACR TI-RADS total points: 3, Midly suspicious nodule Source Thyroid, Fine Needle Aspiration, Isthmus Inferior, (Specimen 1 of 1, colected on 06/25/16 )  3. DM2  PLAN:  1. And 2. Patient with an ~3 year h/o thyrotoxicosis, per Uptake and scan: either mild Graves or mild TMNG. She is now off MMI but did not return for repeat labs after last visit, when we stopped her MMI... - will check TSH, fT4 and  fT3 now  2. Large goiter - has some neck compression sxs, but not very bothersome - reviewed thyroid U/S and Bx obtained since last visit. Dominant nodule >> 3.3 cm >> benign  3. DM2 - very uncontrolled - at today's visit, she is only on Glipizide and she did not get Lantus and Jardiance from the pharmacy and stopped Januvia (for an unknown reason). - strongly advised her to restart insulin >> given her a printed Rx - will also start Januvia back, but may need mealtime insulin at next visit - I advised her to: Patient Instructions  Please restart: - Lantus 50 units 2x a day.  Please continue: - Glipizide 10 mg 2x a day  Restart: - Januvia 50 mg in am  Please return in 1.5 months with your sugar log.   - today, HbA1c is >15%  - continue checking sugars at different times of the day - check 3x a day, rotating checks  - given log - advised for yearly eye exams >> she is UTD - Return to clinic in 1.5 mo with sugar log   Component     Latest Ref Rng & Units 08/20/2016  TSH     0.35 - 4.50 uIU/mL 0.54  T4,Free(Direct)     0.60 - 1.60 ng/dL 1.10  Triiodothyronine,Free,Serum     2.3 - 4.2 pg/mL 3.5  TFTs normal >> will continue off MMI.  Philemon Kingdom, MD PhD [facility]

## 2016-08-20 NOTE — Telephone Encounter (Signed)
[place] called in reference to receiving Rx for patient in the wrong name. Rx is written for "Emily [name]". Please call pharmacy and advise. Patient is at pharmacy waiting.

## 2016-08-21 ENCOUNTER — Telehealth: Payer: Self-pay

## 2016-08-21 NOTE — Telephone Encounter (Signed)
Called patient and gave lab results. Patient had no questions or concerns.  

## 2016-08-21 NOTE — Telephone Encounter (Signed)
-----   Message from Philemon Kingdom, MD sent at 08/20/2016  5:03 PM EDT ----- Almyra Free, can you please call pt: TFTs normal >> please continue off MMI.

## 2016-08-31 ENCOUNTER — Telehealth: Payer: Self-pay | Admitting: Pharmacist

## 2016-08-31 ENCOUNTER — Telehealth: Payer: Self-pay | Admitting: Internal Medicine

## 2016-08-31 NOTE — Telephone Encounter (Signed)
08/31/16 I was reviewing patient next appointment with [facility] and saw that patient has full Medicaid, a copy of card was scanned in [facility], issue date 07/02/16. I will give to [facility], sending email to [facility] staff and to [facility] staff.AJ  Therefore patient is no longer eligible for meds at [facility].[name]

## 2016-08-31 NOTE — Telephone Encounter (Signed)
08/31/16 Called [employer] to check on status of refill request faxed for Lantus Solostar for Dose Change & Change in Doctor, spoke with [place], she found that was was received but not sure why it was not processed, she processed today, allow 2-3 days to receive.Emily [name]

## 2016-08-31 NOTE — Telephone Encounter (Signed)
Called and notified them that if we did have the paperwork it was faxed or scanned in. She then advised that the patient has full medicaid and is no longer eligible for the assistance, that they will let patient know, so no need for the paperwork now.

## 2016-08-31 NOTE — Telephone Encounter (Signed)
Jardiance - 0/92/95 the application([employer]) had already been signed by patient, and I mailed to Dr. Philemon Kingdom office for her to sign and return, we do not show that we have received back. I called the provider's office and the nurse will check on and call me back. I also called [employer] just to see if they had received an application for this med, no record.Emily [name]

## 2016-08-31 NOTE — Telephone Encounter (Signed)
08/31/16 Called [employer] to refill Emily [name], order# [id number] will ship to patient's home, allow 5-7 days to receive. Was previously shipped to patient on 03/20/16 & 05/22/16.[name]

## 2016-08-31 NOTE — Telephone Encounter (Signed)
Medication Management mailed a [employer] form for the patient to take Jardiance back in June. Was the form filled out and mailed back to the sender? Call to advise.

## 2016-09-01 ENCOUNTER — Telehealth: Payer: Self-pay

## 2016-09-01 NOTE — Telephone Encounter (Signed)
Placed signed application/script in [facility] folder for pickup. 

## 2016-09-01 NOTE — Telephone Encounter (Signed)
Received PAP application from [facility] for Ventolin placed for provider to sign.

## 2016-09-24 ENCOUNTER — Ambulatory Visit: Payer: Self-pay

## 2016-10-16 ENCOUNTER — Ambulatory Visit: Payer: Medicaid Other | Admitting: Internal Medicine

## 2016-10-22 ENCOUNTER — Encounter: Payer: Self-pay | Admitting: Internal Medicine

## 2016-10-22 ENCOUNTER — Ambulatory Visit (INDEPENDENT_AMBULATORY_CARE_PROVIDER_SITE_OTHER): Payer: Medicaid Other | Admitting: Internal Medicine

## 2016-10-22 VITALS — BP 160/100 | HR 84 | Wt 200.0 lb

## 2016-10-22 DIAGNOSIS — E041 Nontoxic single thyroid nodule: Secondary | ICD-10-CM | POA: Diagnosis not present

## 2016-10-22 DIAGNOSIS — E1159 Type 2 diabetes mellitus with other circulatory complications: Secondary | ICD-10-CM | POA: Diagnosis not present

## 2016-10-22 DIAGNOSIS — E0590 Thyrotoxicosis, unspecified without thyrotoxic crisis or storm: Secondary | ICD-10-CM

## 2016-10-22 DIAGNOSIS — E040 Nontoxic diffuse goiter: Secondary | ICD-10-CM

## 2016-10-22 DIAGNOSIS — E1165 Type 2 diabetes mellitus with hyperglycemia: Secondary | ICD-10-CM | POA: Diagnosis not present

## 2016-10-22 DIAGNOSIS — E049 Nontoxic goiter, unspecified: Secondary | ICD-10-CM | POA: Diagnosis not present

## 2016-10-22 MED ORDER — CARVEDILOL 25 MG PO TABS: ORAL_TABLET | ORAL | 1 refills | Status: DC

## 2016-10-22 MED ORDER — GLIPIZIDE 10 MG PO TABS: ORAL_TABLET | ORAL | 3 refills | Status: DC

## 2016-10-22 NOTE — Patient Instructions (Addendum)
Please continue: - Lantus 50 units 2x a day. - Glipizide 10 mg 2x a day but move the doses 20 min before meals - Januvia 50 mg in am  Please return in 1.5 months with your sugar log.

## 2016-10-22 NOTE — Progress Notes (Signed)
Patient ID: Emily [name], female   DOB: 1960/03/18, 56 y.o.   MRN: [id number]   HPI  Emily [name] is a 56 y.o.-year-old female, returning for f/u for thyrotoxicosis/goiter and would also want me to manage her DM2, uncontrolled, insulin-dependent, with complications (CKD stage 3, cerebro-vascular ds. - s/p stroke). Last visit 2 mo ago.  TMNG/Graves ds.: Reviewed and addended hx: Pt was dx'ed with hyperthyroidism in ~2014 >> started MMI, lately 10 mg 2x daily. She was lost for f/u at [facility] as she could not travel over there.  Thyroid uptake and scan (06/22/2013) showed: FINDINGS: The 24 hour uptake was 24.7%. The normal reference range at 24 hours is 15 to 35%.   The thyroid scan demonstrates symmetric, mildly heterogenous distribution of radiopharmaceutical throughout the thyroid, which is increased in size.  IMPRESSION: Mildly heterogenous iodine uptake, cannot exclude toxic multinodular goiter versus possible mild Graves disease. Consider ultrasound for further correlation.  Patient was on methimazole before, however, this was stopped after  TSH returned very elevated, in the 90s (02/2015) >> TSH decreased to 8, then normalized >> we continued her off MMI.   However,she was started on MMI 10 mg 2x a day (re-Rx by Dr. Rosanna Randy: 04/17/2016, [facility]) - I do not think he was aware that we had stopped MMI.  We stopped MMI again in 05/2016. TFTs remained normal afterwards.  I reviewed pt's thyroid tests: Lab Results  Component Value Date   TSH 0.54 08/20/2016   TSH 2.840 01/14/2016   TSH 3.20 12/10/2015   TSH 7.49 (H) 11/07/2015   TSH 2.35 07/31/2015   TSH 2.57 06/13/2015   TSH 8.090 (H) 04/09/2015   TSH 94.49 (A) 02/28/2015   FREET4 1.10 08/20/2016   FREET4 0.47 (L) 12/10/2015   FREET4 0.32 (L) 11/07/2015   FREET4 0.62 07/31/2015   FREET4 0.57 (L) 06/13/2015   FREET4 0.30 (L) 04/09/2015   Previously:   We checked  a Thyroid U/S (05/12/2016): Thyroid tissue is  diffusely heterogeneous and enlarged.  The isthmus has a large isoechoic nodular structure. Unclear if this represents a discrete nodule versus focal heterogeneous thyroid tissue. This area measures up to 3.3 cm and recommend biopsy of this area.  Numerous small nodules and cysts throughout the left and right thyroid lobes. These predominantly cystic nodules do not meet criteria for biopsy or dedicated follow-up.  FNA of the isthmic nodule (06/25/2016): benign  Pt does have a FH of thyroid ds.: aunt. No FH of thyroid cancer. No h/o radiation tx to head or neck.  No seaweed or kelp. No recent contrast studies. No herbal supplements. No Biotin use. No recent steroids use.   DM2: - very poorly controlled, h/o noncompliance -  with complications:  H/o stroke, CKD  Her last HbA1c was very high: Lab Results  Component Value Date   HGBA1C higher 15.0 08/20/2016   HGBA1C 12.9 (H) 05/14/2016   HGBA1C 13.8 (H) 04/28/2016   At last visit, she was on: - Lantus 45 units 2x a day >> was off insulin - "the pharmacy did not receive it" - Januvia 50 mg in am >> stopped (?) - Glipizide 10 mg 2x a day >> started in the hospital We added Jardiance at last visit >> not taking  - "the pharmacy did not receive it" She was on Metformin >> diarrhea.  We restarted: - Lantus 50 units 2x a day. - Glipizide 10 mg 2x a day >> ran out 3 weeks ago. Second dose at bedtime! -  Januvia 50 mg in am  She checks sugars 2x a day: - am: 126-162  prev. 200-423 >> 299-449 >> 114-162, 233, 411, 463 - 2h after b'fast: n/c - lunch: n/c - 2h after lunch: n/c - dinner: n/c - 2h after dinner: n/c - bedtime: n/c >> 361-595 >> 161-306, 479  + CKD: Lab Results  Component Value Date   BUN 18 08/13/2016   Lab Results  Component Value Date   CREATININE 1.33 (H) 08/13/2016  On Lisinopril 10  She has HL: Lab Results  Component Value Date   CHOL 125 05/14/2016   HDL 42 05/14/2016   LDLCALC 7 05/14/2016   TRIG  381 (H) 05/14/2016   CHOLHDL 3.0 05/14/2016  On Lipitor 40.  She had an eye exam: 05/2016 >> No DR  ROS: Constitutional: no weight gain/no weight loss, no fatigue, no subjective hyperthermia, no subjective hypothermia, + nocturia Eyes: no blurry vision, no xerophthalmia ENT: no sore throat, no nodules palpated in throat, no dysphagia, no odynophagia, + hoarseness Cardiovascular: no CP/+ SOB/no palpitations/+ leg swelling Respiratory: no cough/+ SOB/no wheezing Gastrointestinal: no N/no V/no D/no C/no acid reflux Musculoskeletal: + muscle aches/no joint aches Skin: no rashes, no hair loss Neurological: no tremors/no numbness/no tingling/no dizziness  I reviewed pt's medications, allergies, PMH, social hx, family hx, and changes were documented in the history of present illness. Otherwise, unchanged from my initial visit note.  Past Medical History:  Diagnosis Date  . Asthma   . COPD (chronic obstructive pulmonary disease) ([place])   . Diabetes mellitus without complication ([place])   . Hyperlipidemia   . Hypertension   . Stroke [facility])    Was told at recent hospital visit she has stroke in past  . Thyroid disease   . Thyroid nodule 04/30/2016   No past surgical history on file. Social History   Social History  . Marital Status: Single    Spouse Name: N/A  . Number of Children: 1   Occupational History  . None   Social History Main Topics  . Smoking status: Current Every Day Smoker -- 1.00 packs/day  . Smokeless tobacco: Not on file  . Alcohol Use: No  . Drug Use: No   Current Outpatient Prescriptions on File Prior to Visit  Medication Sig Dispense Refill  . albuterol (PROVENTIL HFA;VENTOLIN HFA) 108 (90 Base) MCG/ACT inhaler Inhale 1-2 puffs into the lungs every 6 (six) hours as needed for wheezing or shortness of breath. 1 Inhaler 11  . amLODipine (NORVASC) 5 MG tablet Take 1 tablet (5 mg total) by mouth daily. 90 tablet 1  . aspirin 81 MG tablet Take 81 mg by mouth daily.     Marland Kitchen atorvastatin (LIPITOR) 40 MG tablet TAKE ONE TABLET BY MOUTH EVERY DAY 90 tablet PRN  . carvedilol (COREG) 25 MG tablet TAKE ONE TABLET BY MOUTH 2 TIMES A DAY 180 tablet 1  . cyclobenzaprine (FLEXERIL) 5 MG tablet TAKE ONE TABLET BY MOUTH EVERY DAY AS NEEDED FOR MUSCLE SPASMS 30 tablet 0  . empagliflozin (JARDIANCE) 10 MG TABS tablet Take 10 mg by mouth daily. (Patient not taking: Reported on 05/21/2016) 30 tablet 3  . glipiZIDE (GLUCOTROL) 10 MG tablet TAKE ONE TABLET BY MOUTH 2 TIMES A DAY 180 tablet 0  . Insulin Glargine (LANTUS SOLOSTAR) 100 UNIT/ML Solostar Pen Inject 50 Units into the skin 2 (two) times daily. 10 pen 11  . lisinopril (PRINIVIL,ZESTRIL) 10 MG tablet Take 1 tablet (10 mg total) by mouth daily. 30 tablet  3  . predniSONE (DELTASONE) 50 MG tablet Take 1 tablet (50 mg total) by mouth daily with breakfast. (Patient not taking: Reported on 08/20/2016) 5 tablet 0  . sitaGLIPtin (JANUVIA) 50 MG tablet Take 50 mg by mouth daily.    Marland Kitchen tiotropium (SPIRIVA) 18 MCG inhalation capsule Place 18 mcg into inhaler and inhale daily.    . Tiotropium Bromide Monohydrate (SPIRIVA RESPIMAT) 1.25 MCG/ACT AERS Inhale 2 puffs into the lungs 2 (two) times daily.     No current facility-administered medications on file prior to visit.    No Known Allergies Family History  Problem Relation Age of Onset  . Diabetes Mother   . Hypertension Mother   . Diabetes Father   . Hypertension Father   . Diabetes Sister    PE: BP (!) 160/100 (BP Location: Left Arm, Patient Position: Sitting, Cuff Size: Normal)   Pulse 84   Wt 200 lb (90.7 kg)   SpO2 96%   BMI 33.80 kg/m  Body mass index is 33.8 kg/m.  Wt Readings from Last 3 Encounters:  10/22/16 200 lb (90.7 kg)  08/20/16 187 lb (84.8 kg)  08/13/16 205 lb (93 kg)   Constitutional: overweight, in NAD Eyes: PERRLA, EOMI, no exophthalmos ENT: moist mucous membranes, + L>R thyromegaly, no cervical lymphadenopathy Cardiovascular: RRR, No  MRG Respiratory: CTA B Gastrointestinal: abdomen soft, NT, ND, BS+ Musculoskeletal: no deformities, strength intact in all 4 Skin: moist, warm, no rashes Neurological: no tremor with outstretched hands, DTR normal in all 4  ASSESSMENT: 1. Thyrotoxicosis  2. Large goiter US SOFT TISSUE HEAD AND NECK  Order: [id number]  Status:  Final result Visible to patient:  No (Not Released) Dx:  Goiter diffuse  Details   Reading Physician Reading Date Result Priority  Markus Daft, MD 05/12/2016   Narrative    CLINICAL DATA: Thyromegaly on previous chest CT.  EXAM: THYROID ULTRASOUND  TECHNIQUE: Ultrasound examination of the thyroid gland and adjacent soft tissues was performed.  COMPARISON: Chest CT 01/14/2016  FINDINGS: Parenchymal Echotexture: Moderately heterogenous  Isthmus: 1.2 cm in the AP dimension  Right lobe: 8.4 x 2.8 x 4.5 cm  Left lobe: 8.2 x 3.0 x 3.7 cm  _________________________________________________________  Estimated total number of nodules >/= 1 cm: 6-10  Number of spongiform nodules >/= 2 cm not described below (TR1): 0  Number of mixed cystic and solid nodules >/= 1.5 cm not described below ([place]): 0  _________________________________________________________  Nodule # 1:  Location: Isthmus; Inferior  Maximum size: 3.3 cm; Other 2 dimensions: 1.7 x 2.7 cm  Composition: solid/almost completely solid (2)  Echogenicity: isoechoic (1)  **Given size (>/= 2.5 cm) and appearance, fine needle aspiration of this mildly suspicious nodule should be considered based on TI-RADS criteria.  _________________________________________________________  Mildly complex cystic structure in the superior right thyroid lobe measuring up to 1.0 cm. Minimally complex cyst in the superior right thyroid lobe measuring up to 1.6 cm. Adjacent complex cysts or spongiform nodules in the mid right thyroid lobe, largest measuring 1.1 cm. Spongiform nodule in the mid left  thyroid lobe measuring up to 1.3 cm.  Nodule # 2:  Location: Left; Mid  Maximum size: 2.0 cm; Other 2 dimensions: 0.8 x 0.8 cm  Composition: mixed cystic and solid (1)  Echogenicity: cannot determine (1)  This nodule does NOT meet TI-RADS criteria for biopsy or dedicated follow-up.  _________________________________________________________  IMPRESSION: Thyroid tissue is diffusely heterogeneous and enlarged.  The isthmus has a large isoechoic nodular structure. Unclear  if this represents a discrete nodule versus focal heterogeneous thyroid tissue. This area measures up to 3.3 cm and recommend biopsy of this area.  Numerous small nodules and cysts throughout the left and right thyroid lobes. These predominantly cystic nodules do not meet criteria for biopsy or dedicated follow-up.  The above is in keeping with the ACR TI-RADS recommendations - J Am Coll Radiol 2017;14:587-595.   Electronically Signed By: Markus Daft M.D. On: 05/12/2016 12:03        Addendum: Adequacy Reason Satisfactory For Evaluation. Diagnosis THYROID, FINE NEEDLE ASPIRATION ISTHMUS INFERIOR (SPECIMEN 1 OF 1, COLLECTED ON 06/25/2016): CONSISTENT WITH BENIGN FOLLICULAR NODULE ([place] CATEGORY II). Casimer Lanius MD Pathologist, Electronic Signature (Case signed 06/26/2016) Specimen Clinical Information Nodule 1 Isthmus Inferior 3.3 cm; Other 2 dimensions: 1.7 x 2.7 cm, Solid / almost completely solid, Isoechoic, ACR TI-RADS total points: 3, Midly suspicious nodule Source Thyroid, Fine Needle Aspiration, Isthmus Inferior, (Specimen 1 of 1, colected on 06/25/16 )  3. DM2, insulin dependent, unconrolled  PLAN:  1. And 2. Patient with an ~3 year h/o thyrotoxicosis, per Uptake and scan: either mild Graves or mild TMNG. She is now off MMI - reviewed latest TFT levels: at last OV >> normal -  Will check TFTs today  2. Large goiter - + some neck compression sxs but not very bothersome -  reviewed latest thyroid U/S and Bx. Dominant nodule >> 3.3 cm >> benign  3. DM2 - very uncontrolled - at last OV, she was only on Glipizide and she did not get Lantus and Jardiance from the pharmacy and stopped Januvia (for an unknown reason). - I gave her a Rx for insulin and also restarted Januvia at last visit. She did start insulin and sugars are much better with several exceptions: Hgly in the 400s (unexplained). She did run out of Glipizide 3 weeks ago... Refilled and advised not to run out anymore. Also, she was taking the second dose at bedtime >> will move before dinner - will continue the current regimen due to the great improvement in control since last visit, but will have her back in another 1.5 mo for a recheck - will need a new meter >> advised her to call [place] to see which is covered. We can always use [facility] also - I advised her to: Patient Instructions  Please continue: - Lantus 50 units 2x a day. - Glipizide 10 mg 2x a day but move the doses 20 min before meals - Januvia 50 mg in am  Please return in 1.5 months with your sugar log.   - continue checking sugars at different times of the day - check 1-2x a day, rotating checks - advised for yearly eye exams >> she is UTD - she is UTD with flu shot - Return to clinic in 1.5 mo with sugar log - will check HbA1c then  Philemon Kingdom, MD PhD [facility]

## 2016-11-06 ENCOUNTER — Telehealth: Payer: Self-pay | Admitting: Internal Medicine

## 2016-11-06 NOTE — Telephone Encounter (Signed)
I tried to call Raquel Sarna at [facility]/she had already gone home for the day/I faxed a cover sheet for clarification to [phone] stating that Dr. Philemon Kingdom is responsible for this patient's DM needs and to plz advise if they need anything along those lines/thx dmf

## 2016-11-06 NOTE — Telephone Encounter (Signed)
Emily [name] from [facility] center stated patient has established care with a PCP doctor there. But patient was confused about who was taking care of her diabetic needs. And what medication she was taking and who was prescribing them. Need clarifcation please advise [phone]

## 2016-11-19 ENCOUNTER — Ambulatory Visit: Payer: Self-pay | Admitting: Ophthalmology

## 2016-11-19 ENCOUNTER — Ambulatory Visit: Payer: Medicaid Other | Admitting: Ophthalmology

## 2016-11-23 ENCOUNTER — Ambulatory Visit (INDEPENDENT_AMBULATORY_CARE_PROVIDER_SITE_OTHER): Payer: Medicaid Other | Admitting: Vascular Surgery

## 2016-11-23 ENCOUNTER — Encounter (INDEPENDENT_AMBULATORY_CARE_PROVIDER_SITE_OTHER): Payer: Self-pay | Admitting: Vascular Surgery

## 2016-11-23 VITALS — BP 156/94 | HR 98 | Resp 16 | Ht 64.5 in | Wt 205.0 lb

## 2016-11-23 DIAGNOSIS — E1159 Type 2 diabetes mellitus with other circulatory complications: Secondary | ICD-10-CM

## 2016-11-23 DIAGNOSIS — M79605 Pain in left leg: Secondary | ICD-10-CM

## 2016-11-23 DIAGNOSIS — N183 Chronic kidney disease, stage 3 unspecified: Secondary | ICD-10-CM

## 2016-11-23 DIAGNOSIS — M79604 Pain in right leg: Secondary | ICD-10-CM | POA: Diagnosis not present

## 2016-11-23 DIAGNOSIS — Z720 Tobacco use: Secondary | ICD-10-CM | POA: Diagnosis not present

## 2016-11-23 DIAGNOSIS — E1165 Type 2 diabetes mellitus with hyperglycemia: Secondary | ICD-10-CM | POA: Diagnosis not present

## 2016-11-23 NOTE — Progress Notes (Signed)
Subjective:    Patient ID: Emily [name], female    DOB: 1960-03-21, 56 y.o.   MRN: [id number] Chief Complaint  Patient presents with  . New Patient (Initial Visit)    poss vascular complications   Presents as a new patient referred by NP Headrick for "evaluation of vascular complication".  The patient presents with a 2-51-month history of "lower extremity pain".  This pain starts at her bilateral hips and radiates down towards her feet.  Describes the pain as "shooting".  The pain is not always associated with activity.  Patient states the symptoms/discomfort to the left lower extremity is greater than the right.  Denies any recent surgery or trauma to the lower extremity.  The patient was ruled out for a left lower extremity DVT in August 2018.  Does experience intermittent swelling however does not engage in conservative therapy at this time.  Patient denies any rest pain or ulceration to lower extremity.  She denies any fever, nausea or vomiting.   Review of Systems  Constitutional: Negative.   HENT: Negative.   Eyes: Negative.   Respiratory: Negative.   Cardiovascular:       Lower extremity pain  Gastrointestinal: Negative.   Endocrine: Negative.   Genitourinary: Negative.   Musculoskeletal: Negative.   Skin: Negative.   Allergic/Immunologic: Negative.   Neurological: Negative.   Hematological: Negative.   Psychiatric/Behavioral: Negative.       Objective:   Physical Exam  Constitutional: She is oriented to person, place, and time. She appears well-developed and well-nourished. No distress.  HENT:  Head: Normocephalic and atraumatic.  Eyes: Conjunctivae are normal. Pupils are equal, round, and reactive to light.  Neck: Normal range of motion.  Cardiovascular: Normal rate, regular rhythm, normal heart sounds and intact distal pulses.  Pulses:      Radial pulses are 2+ on the right side, and 2+ on the left side.  Hard to palpate pedal pulses however her bilateral feet  are warm  Pulmonary/Chest: Effort normal and breath sounds normal.  Musculoskeletal: Normal range of motion. She exhibits edema (Mild bilateral lower extremity edema noted).  Neurological: She is alert and oriented to person, place, and time.  Skin: Skin is warm and dry. She is not diaphoretic.  There is no stasis dermatitis.  Less than 1 cm scattered varicosities to the bilateral lower extremity.  There is no cellulitis.  Psychiatric: She has a normal mood and affect. Her behavior is normal. Judgment and thought content normal.  Vitals reviewed.  BP (!) 156/94 (BP Location: Right Arm)   Pulse 98   Resp 16   Ht 5' 4.5" (1.638 m)   Wt 205 lb (93 kg)   BMI 34.64 kg/m   Past Medical History:  Diagnosis Date  . Asthma   . COPD (chronic obstructive pulmonary disease) ([place])   . Diabetes mellitus without complication ([place])   . Hyperlipidemia   . Hypertension   . Stroke [facility])    Was told at recent hospital visit she has stroke in past  . Thyroid disease   . Thyroid nodule 04/30/2016   Social History   Socioeconomic History  . Marital status: Single    Spouse name: Not on file  . Number of children: Not on file  . Years of education: Not on file  . Highest education level: Not on file  Social Needs  . Financial resource strain: Not on file  . Food insecurity - worry: Not on file  . Food insecurity - inability:  Not on file  . Transportation needs - medical: Not on file  . Transportation needs - non-medical: Not on file  Occupational History  . Not on file  Tobacco Use  . Smoking status: Former Smoker    Packs/day: 1.50    Types: Cigarettes    Last attempt to quit: 01/13/2016    Years since quitting: 0.8  . Smokeless tobacco: Never Used  Substance and Sexual Activity  . Alcohol use: No  . Drug use: No  . Sexual activity: Not on file  Other Topics Concern  . Not on file  Social History Narrative  . Not on file   Past Surgical History:  Procedure Laterality Date  . arm  surgery Left    Family History  Problem Relation Age of Onset  . Diabetes Mother   . Hypertension Mother   . Diabetes Father   . Hypertension Father   . Diabetes Sister    No Known Allergies     Assessment & Plan:  Presents as a new patient referred by NP Headrick for "evaluation of vascular complication".  The patient presents with a 2-62-month history of "lower extremity pain".  This pain starts at her bilateral hips and radiates down towards her feet.  Describes the pain as "shooting".  The pain is not always associated with activity.  Patient states the symptoms/discomfort to the left lower extremity is greater than the right.  Denies any recent surgery or trauma to the lower extremity.  The patient was ruled out for a left lower extremity DVT in August 2018.  Does experience intermittent swelling however does not engage in conservative therapy at this time.  Patient denies any rest pain or ulceration to lower extremity.  She denies any fever, nausea or vomiting.  1. Tobacco abuse - Stable We had a discussion for approximately 10 minutes regarding the absolute need for smoking cessation due to the deleterious nature of tobacco on the vascular system. We discussed the tobacco use would diminish patency of any intervention, and likely significantly worsen progressio of disease. We discussed multiple agents for quitting including replacement therapy or medications to reduce cravings such as Chantix. The patient voices their understanding of the importance of smoking cessation.  2. Stage 3 chronic kidney disease (HCC) - Stable Contributing factor to the patient's bilateral lower extremity edema Encouraged good diabetic and hypertensive control as this worsens kidney disease.  3. Poorly controlled type 2 diabetes mellitus with circulatory disorder (HCC) - Stable Encouraged good control as its slows the progression of atherosclerotic disease  4. Lower extremity pain, bilateral - New Patient  with multiple risk factors for peripheral artery disease Faint pedal pulses on exam Patient with bilateral lower extremity claudication-like symptoms not always associated with activity I will order a bilateral ABI to rule out any contributing peripheral artery disease She was encouraged to wear medical grade 1 compression stockings 20-30 mmHg She was encouraged to elevate her legs heart level or higher on a daily basis I will assess her progress when she follows up to undergo the ABI.  - VAS [place] ABI WITH/WO TBI; Future  Current Outpatient Medications on File Prior to Visit  Medication Sig Dispense Refill  . albuterol (PROVENTIL HFA;VENTOLIN HFA) 108 (90 Base) MCG/ACT inhaler Inhale 1-2 puffs into the lungs every 6 (six) hours as needed for wheezing or shortness of breath. 1 Inhaler 11  . amLODipine (NORVASC) 5 MG tablet Take 1 tablet (5 mg total) by mouth daily. 90 tablet 1  .  aspirin 81 MG tablet Take 81 mg by mouth daily.    Marland Kitchen atorvastatin (LIPITOR) 40 MG tablet TAKE ONE TABLET BY MOUTH EVERY DAY 90 tablet PRN  . carvedilol (COREG) 25 MG tablet TAKE ONE TABLET BY MOUTH 2 TIMES A DAY 180 tablet 1  . cyclobenzaprine (FLEXERIL) 5 MG tablet TAKE ONE TABLET BY MOUTH EVERY DAY AS NEEDED FOR MUSCLE SPASMS 30 tablet 0  . glipiZIDE (GLUCOTROL) 10 MG tablet TAKE ONE TABLET BY MOUTH 2 TIMES A DAY before meals 180 tablet 3  . Insulin Glargine (LANTUS SOLOSTAR) 100 UNIT/ML Solostar Pen Inject 50 Units into the skin 2 (two) times daily. 10 pen 11  . lisinopril (PRINIVIL,ZESTRIL) 10 MG tablet Take 1 tablet (10 mg total) by mouth daily. 30 tablet 3  . sitaGLIPtin (JANUVIA) 50 MG tablet Take 50 mg by mouth daily.    Marland Kitchen tiotropium (SPIRIVA) 18 MCG inhalation capsule Place 18 mcg into inhaler and inhale daily.    . Tiotropium Bromide Monohydrate (SPIRIVA RESPIMAT) 1.25 MCG/ACT AERS Inhale 2 puffs into the lungs 2 (two) times daily.    . predniSONE (DELTASONE) 50 MG tablet Take 1 tablet (50 mg total) by mouth  daily with breakfast. (Patient not taking: Reported on 11/23/2016) 5 tablet 0   No current facility-administered medications on file prior to visit.    There are no Patient Instructions on file for this visit. No Follow-up on file.  Mckenzye Cutright A Asia Favata, PA-C

## 2016-12-16 ENCOUNTER — Ambulatory Visit: Payer: Medicaid Other | Admitting: Internal Medicine

## 2016-12-17 ENCOUNTER — Ambulatory Visit (INDEPENDENT_AMBULATORY_CARE_PROVIDER_SITE_OTHER): Payer: Medicaid Other

## 2016-12-17 ENCOUNTER — Encounter (INDEPENDENT_AMBULATORY_CARE_PROVIDER_SITE_OTHER): Payer: Self-pay | Admitting: Vascular Surgery

## 2016-12-17 ENCOUNTER — Ambulatory Visit (INDEPENDENT_AMBULATORY_CARE_PROVIDER_SITE_OTHER): Payer: Medicaid Other | Admitting: Vascular Surgery

## 2016-12-17 VITALS — BP 166/100 | HR 82 | Resp 17 | Ht 65.5 in | Wt 208.0 lb

## 2016-12-17 DIAGNOSIS — M79604 Pain in right leg: Secondary | ICD-10-CM | POA: Diagnosis not present

## 2016-12-17 DIAGNOSIS — M79605 Pain in left leg: Secondary | ICD-10-CM

## 2016-12-17 DIAGNOSIS — Z720 Tobacco use: Secondary | ICD-10-CM

## 2016-12-17 DIAGNOSIS — E1165 Type 2 diabetes mellitus with hyperglycemia: Secondary | ICD-10-CM | POA: Diagnosis not present

## 2016-12-17 DIAGNOSIS — E1159 Type 2 diabetes mellitus with other circulatory complications: Secondary | ICD-10-CM | POA: Diagnosis not present

## 2016-12-17 NOTE — Progress Notes (Signed)
Subjective:    Patient ID: Emily [name], female    DOB: [birth year], 58 y.o.   MRN: [id number] Chief Complaint  Patient presents with  . Follow-up    ABI   Patient presents to review vascular studies.  She was last seen on November 23, 2016 and evaluation of lower extremity discomfort.  Patient continues to improve sent with lower extremity claudication with and without activity.  The patient denies any worsening of her symptoms.  The patient denies any rest pain or ulceration to the lower extremity.  The patient underwent a bilateral ABI which was notable for no significant lower extremity arterial disease.  Toe brachial indices were normal bilaterally.  There was no previous ankle-brachial indices for comparison.  The patient denies any fever, nausea vomiting.   Review of Systems  Constitutional: Negative.   HENT: Negative.   Eyes: Negative.   Respiratory: Negative.   Cardiovascular:       Bilateral lower extremity claudication-like symptoms.  Gastrointestinal: Negative.   Endocrine: Negative.   Genitourinary: Negative.   Musculoskeletal: Negative.   Skin: Negative.   Allergic/Immunologic: Negative.   Neurological: Negative.   Hematological: Negative.   Psychiatric/Behavioral: Negative.       Objective:   Physical Exam  Constitutional: She is oriented to person, place, and time. She appears well-developed and well-nourished. No distress.  HENT:  Head: Normocephalic and atraumatic.  Eyes: Conjunctivae are normal. Pupils are equal, round, and reactive to light.  Neck: Normal range of motion.  Cardiovascular: Normal rate, regular rhythm, normal heart sounds and intact distal pulses.  Pulses:      Radial pulses are 2+ on the right side, and 2+ on the left side.  Hard to palpate pedal pulses however her bilateral feet are warm  Pulmonary/Chest: Effort normal and breath sounds normal.  Musculoskeletal: Normal range of motion. She exhibits no edema.  Neurological: She is  alert and oriented to person, place, and time.  Skin: Skin is warm and dry. She is not diaphoretic.  Psychiatric: She has a normal mood and affect. Her behavior is normal. Judgment and thought content normal.  Vitals reviewed.  BP (!) 166/100 (BP Location: Right Arm, Patient Position: Sitting)   Pulse 82   Resp 17   Ht 5' 5.5" (1.664 m)   Wt 208 lb (94.3 kg)   BMI 34.09 kg/m   Past Medical History:  Diagnosis Date  . Asthma   . COPD (chronic obstructive pulmonary disease) ([place])   . Diabetes mellitus without complication ([place])   . Hyperlipidemia   . Hypertension   . Stroke [facility])    Was told at recent hospital visit she has stroke in past  . Thyroid disease   . Thyroid nodule 04/30/2016   Social History   Socioeconomic History  . Marital status: Single    Spouse name: Not on file  . Number of children: Not on file  . Years of education: Not on file  . Highest education level: Not on file  Social Needs  . Financial resource strain: Not on file  . Food insecurity - worry: Not on file  . Food insecurity - inability: Not on file  . Transportation needs - medical: Not on file  . Transportation needs - non-medical: Not on file  Occupational History  . Not on file  Tobacco Use  . Smoking status: Former Smoker    Packs/day: 1.50    Types: Cigarettes    Last attempt to quit: 01/13/2016    Years  since quitting: 0.9  . Smokeless tobacco: Never Used  Substance and Sexual Activity  . Alcohol use: No  . Drug use: No  . Sexual activity: Not on file  Other Topics Concern  . Not on file  Social History Narrative  . Not on file   Past Surgical History:  Procedure Laterality Date  . arm surgery Left    Family History  Problem Relation Age of Onset  . Diabetes Mother   . Hypertension Mother   . Diabetes Father   . Hypertension Father   . Diabetes Sister    No Known Allergies     Assessment & Plan:  Patient presents to review vascular studies.  She was last seen on  November 23, 2016 and evaluation of lower extremity discomfort.  Patient continues to improve sent with lower extremity claudication with and without activity.  The patient denies any worsening of her symptoms.  The patient denies any rest pain or ulceration to the lower extremity.  The patient underwent a bilateral ABI which was notable for no significant lower extremity arterial disease.  Toe brachial indices were normal bilaterally.  There was no previous ankle-brachial indices for comparison.  The patient denies any fever, nausea vomiting.  1. Lower extremity pain, bilateral - Stable With normal ABI completed today in the office. I do not feel the patient's lower extremity pain which occurs on an intermittent basis associated with and without activity is from arterial insufficiency The patient was encouraged to follow-up with her primary care physician and possibly be worked up for osteoarthritis or degenerative joint disease is a contributing factor She did follow-up with [place] every 2-3 years for an ABI or as needed The patient expresses her understanding  2. Poorly controlled type 2 diabetes mellitus with circulatory disorder (HCC) - Stable Encouraged good control as its slows the progression of atherosclerotic disease  3. Tobacco abuse - Stable We had a discussion for approximately 5 minutes regarding the absolute need for smoking cessation due to the deleterious nature of tobacco on the vascular system. We discussed the tobacco use would diminish patency of any intervention, and likely significantly worsen progressio of disease. We discussed multiple agents for quitting including replacement therapy or medications to reduce cravings such as Chantix. The patient voices their understanding of the importance of smoking cessation.  Current Outpatient Medications on File Prior to Visit  Medication Sig Dispense Refill  . albuterol (PROVENTIL HFA;VENTOLIN HFA) 108 (90 Base) MCG/ACT inhaler Inhale 1-2  puffs into the lungs every 6 (six) hours as needed for wheezing or shortness of breath. 1 Inhaler 11  . amLODipine (NORVASC) 5 MG tablet Take 1 tablet (5 mg total) by mouth daily. 90 tablet 1  . aspirin 81 MG tablet Take 81 mg by mouth daily.    Marland Kitchen atorvastatin (LIPITOR) 40 MG tablet TAKE ONE TABLET BY MOUTH EVERY DAY 90 tablet PRN  . carvedilol (COREG) 25 MG tablet TAKE ONE TABLET BY MOUTH 2 TIMES A DAY 180 tablet 1  . cyclobenzaprine (FLEXERIL) 5 MG tablet TAKE ONE TABLET BY MOUTH EVERY DAY AS NEEDED FOR MUSCLE SPASMS 30 tablet 0  . glipiZIDE (GLUCOTROL) 10 MG tablet TAKE ONE TABLET BY MOUTH 2 TIMES A DAY before meals 180 tablet 3  . Insulin Glargine (LANTUS SOLOSTAR) 100 UNIT/ML Solostar Pen Inject 50 Units into the skin 2 (two) times daily. 10 pen 11  . lisinopril (PRINIVIL,ZESTRIL) 10 MG tablet Take 1 tablet (10 mg total) by mouth daily. 30 tablet 3  .  predniSONE (DELTASONE) 50 MG tablet Take 1 tablet (50 mg total) by mouth daily with breakfast. (Patient not taking: Reported on 11/23/2016) 5 tablet 0  . sitaGLIPtin (JANUVIA) 50 MG tablet Take 50 mg by mouth daily.    Marland Kitchen tiotropium (SPIRIVA) 18 MCG inhalation capsule Place 18 mcg into inhaler and inhale daily.    . Tiotropium Bromide Monohydrate (SPIRIVA RESPIMAT) 1.25 MCG/ACT AERS Inhale 2 puffs into the lungs 2 (two) times daily.     No current facility-administered medications on file prior to visit.    There are no Patient Instructions on file for this visit. No Follow-up on file.  Mikale Silversmith A Derrian Rodak, PA-C

## 2016-12-30 ENCOUNTER — Ambulatory Visit: Payer: Medicaid Other | Admitting: Internal Medicine

## 2017-01-06 ENCOUNTER — Other Ambulatory Visit: Payer: Self-pay | Admitting: Family Medicine

## 2017-01-06 DIAGNOSIS — Z0000 Encounter for general adult medical examination without abnormal findings: Secondary | ICD-10-CM

## 2017-02-23 ENCOUNTER — Encounter: Payer: Self-pay | Admitting: Internal Medicine

## 2017-02-23 ENCOUNTER — Ambulatory Visit (INDEPENDENT_AMBULATORY_CARE_PROVIDER_SITE_OTHER): Payer: Medicaid Other | Admitting: Internal Medicine

## 2017-02-23 VITALS — BP 146/88 | HR 85 | Ht 65.5 in | Wt 214.6 lb

## 2017-02-23 DIAGNOSIS — E040 Nontoxic diffuse goiter: Secondary | ICD-10-CM

## 2017-02-23 DIAGNOSIS — E1165 Type 2 diabetes mellitus with hyperglycemia: Secondary | ICD-10-CM | POA: Diagnosis not present

## 2017-02-23 DIAGNOSIS — E0590 Thyrotoxicosis, unspecified without thyrotoxic crisis or storm: Secondary | ICD-10-CM | POA: Diagnosis not present

## 2017-02-23 DIAGNOSIS — E1159 Type 2 diabetes mellitus with other circulatory complications: Secondary | ICD-10-CM

## 2017-02-23 DIAGNOSIS — E049 Nontoxic goiter, unspecified: Secondary | ICD-10-CM | POA: Diagnosis not present

## 2017-02-23 DIAGNOSIS — E041 Nontoxic single thyroid nodule: Secondary | ICD-10-CM | POA: Diagnosis not present

## 2017-02-23 LAB — POCT GLYCOSYLATED HEMOGLOBIN (HGB A1C): HEMOGLOBIN A1C: 10.9

## 2017-02-23 MED ORDER — DULAGLUTIDE 0.75 MG/0.5ML ~~LOC~~ SOAJ: SUBCUTANEOUS | 1 refills | Status: DC

## 2017-02-23 NOTE — Patient Instructions (Signed)
Please continue: - Lantus 50 units 2x a day - Glipizide 10 mg 2x a day before meals  Please start Trulicity 3.40 mg weekly. Let me know when you are close to running out to call in the higher dose to your pharmacy (1.5 mg).  Please return in 3 months with your sugar log.

## 2017-02-23 NOTE — Progress Notes (Signed)
Patient ID: Emily [name], female   DOB: 1960/07/31, 57 y.o.   MRN: [id number]   HPI  Emily [name] is a 57 y.o.-year-old female, returning for f/u for thyrotoxicosis/goiter and DM2, uncontrolled, insulin-dependent, with complications (CKD stage 3, cerebro-vascular ds. - s/p stroke). Last visit 4 months ago.  TMNG/Graves ds.: Reviewed history: Pt was dx'ed with hyperthyroidism in ~2014 >> started MMI, lately 10 mg 2x daily. She was lost for f/u at [facility] as she could not travel over there.  Thyroid uptake and scan (06/22/2013) showed: FINDINGS: The 24 hour uptake was 24.7%. The normal reference range at 24 hours is 15 to 35%.   The thyroid scan demonstrates symmetric, mildly heterogenous distribution of radiopharmaceutical throughout the thyroid, which is increased in size.  IMPRESSION: Mildly heterogenous iodine uptake, cannot exclude toxic multinodular goiter versus possible mild Graves disease. Consider ultrasound for further correlation.  Patient was on methimazole before, however, this was stopped after  TSH returned very elevated, in the 90s (02/2015) >> TSH decreased to 8, then normalized >> we continued her off MMI.   However,she was started on MMI 10 mg 2x a day (re-Rx by Dr. Rosanna Randy: 04/17/2016, [facility]) - I do not think he was aware that we had stopped MMI.  We stop methimazole again in 05/2016.  Patient's TFTs remained normal after we stopped methimazole: Lab Results  Component Value Date   TSH 0.54 08/20/2016   TSH 2.840 01/14/2016   TSH 3.20 12/10/2015   TSH 7.49 (H) 11/07/2015   TSH 2.35 07/31/2015   TSH 2.57 06/13/2015   TSH 8.090 (H) 04/09/2015   TSH 94.49 (A) 02/28/2015   FREET4 1.10 08/20/2016   FREET4 0.47 (L) 12/10/2015   FREET4 0.32 (L) 11/07/2015   FREET4 0.62 07/31/2015   FREET4 0.57 (L) 06/13/2015   FREET4 0.30 (L) 04/09/2015   Previously:   Thyroid U/S (05/12/2016): Large isthmic nodule Thyroid tissue is diffusely heterogeneous and  enlarged.  The isthmus has a large isoechoic nodular structure. Unclear if this represents a discrete nodule versus focal heterogeneous thyroid tissue. This area measures up to 3.3 cm and recommend biopsy of this area.  Numerous small nodules and cysts throughout the left and right thyroid lobes. These predominantly cystic nodules do not meet criteria for biopsy or dedicated follow-up.  FNA of the isthmic nodule (06/25/2016): Benign  Pt denies: - feeling nodules in neck - hoarseness - dysphagia - choking - SOB with lying down  Pt does have a FH of thyroid ds.: aunt.No FH of thyroid cancer. No h/o radiation tx to head or neck.  No seaweed or kelp. No recent contrast studies. No herbal supplements. No Biotin use. No recent steroids use.   DM2: -Very poorly controlled, with history of noncompliance -With complications: History of stroke, also CKD  Last HbA1c was very high: Lab Results  Component Value Date   HGBA1C higher 15.0 08/20/2016   HGBA1C 12.9 (H) 05/14/2016   HGBA1C 13.8 (H) 04/28/2016   At last visit, she was on: - Lantus 45 units 2x a day >> was off insulin - "the pharmacy did not receive it" - Januvia 50 mg in am >> stopped (?) - Glipizide 10 mg 2x a day >> started in the hospital We added Jardiance at last visit >> not taking  - "the pharmacy did not receive it" She was on Metformin >> diarrhea.  At last visit, I suggested: - Lantus 50 units 2x a day - Glipizide 10 mg 2x a day, before  meals  Ran out of the Rx  She checked sugars twice a day in 11-12/2017, then stopped checking: - am: 299-449 >> 114-162, 233, 411, 463 >> 65, 85-216 - 2h after b'fast: n/c - lunch: n/c - 2h after lunch: n/c - dinner: n/c >> 80-305, 390 - 2h after dinner: n/c - bedtime: n/c >> 361-595 >> 161-306, 479 >> n/c  She does have CKD: Lab Results  Component Value Date   BUN 18 08/13/2016   Lab Results  Component Value Date   CREATININE 1.33 (H) 08/13/2016  On lisinopril  10.  + HL.  Latest lipid profile: Lab Results  Component Value Date   CHOL 125 05/14/2016   HDL 42 05/14/2016   LDLCALC 7 05/14/2016   TRIG 381 (H) 05/14/2016   CHOLHDL 3.0 05/14/2016  On Lipitor 40.  Latest dilated eye exam was in 11/2016: No DR  ROS: Constitutional: no weight gain/no weight loss, no fatigue, no subjective hyperthermia, no subjective hypothermia Eyes: no blurry vision, no xerophthalmia ENT: no sore throat, + see HPI Cardiovascular: no CP/no SOB/no palpitations/+ leg swelling Respiratory: no cough/no SOB/no wheezing Gastrointestinal: no N/no V/no D/no C/no acid reflux Musculoskeletal: no muscle aches/no joint aches Skin: no rashes, no hair loss Neurological: no tremors/no numbness/no tingling/no dizziness  I reviewed pt's medications, allergies, PMH, social hx, family hx, and changes were documented in the history of present illness. Otherwise, unchanged from my initial visit note.  Past Medical History:  Diagnosis Date  . Asthma   . COPD (chronic obstructive pulmonary disease) ([place])   . Diabetes mellitus without complication ([place])   . Hyperlipidemia   . Hypertension   . Stroke [facility]    Was told at recent hospital visit she has stroke in past  . Thyroid disease   . Thyroid nodule 04/30/2016   Past Surgical History:  Procedure Laterality Date  . arm surgery Left    Social History   Social History  . Marital Status: Single    Spouse Name: N/A  . Number of Children: 1   Occupational History  . None   Social History Main Topics  . Smoking status: Current Every Day Smoker -- 1.00 packs/day  . Smokeless tobacco: Not on file  . Alcohol Use: No  . Drug Use: No   Current Outpatient Medications on File Prior to Visit  Medication Sig Dispense Refill  . albuterol (PROVENTIL HFA;VENTOLIN HFA) 108 (90 Base) MCG/ACT inhaler Inhale 1-2 puffs into the lungs every 6 (six) hours as needed for wheezing or shortness of breath. 1 Inhaler 11  . amLODipine  (NORVASC) 5 MG tablet Take 1 tablet (5 mg total) by mouth daily. 90 tablet 1  . aspirin 81 MG tablet Take 81 mg by mouth daily.    Marland Kitchen atorvastatin (LIPITOR) 40 MG tablet TAKE ONE TABLET BY MOUTH EVERY DAY 90 tablet PRN  . carvedilol (COREG) 25 MG tablet TAKE ONE TABLET BY MOUTH 2 TIMES A DAY 180 tablet 1  . cyclobenzaprine (FLEXERIL) 5 MG tablet TAKE ONE TABLET BY MOUTH EVERY DAY AS NEEDED FOR MUSCLE SPASMS 30 tablet 0  . glipiZIDE (GLUCOTROL) 10 MG tablet TAKE ONE TABLET BY MOUTH 2 TIMES A DAY before meals 180 tablet 3  . Insulin Glargine (LANTUS SOLOSTAR) 100 UNIT/ML Solostar Pen Inject 50 Units into the skin 2 (two) times daily. 10 pen 11  . lisinopril (PRINIVIL,ZESTRIL) 10 MG tablet Take 1 tablet (10 mg total) by mouth daily. 30 tablet 3  . predniSONE (DELTASONE) 50 MG  tablet Take 1 tablet (50 mg total) by mouth daily with breakfast. (Patient not taking: Reported on 11/23/2016) 5 tablet 0  . sitaGLIPtin (JANUVIA) 50 MG tablet Take 50 mg by mouth daily.    Marland Kitchen tiotropium (SPIRIVA) 18 MCG inhalation capsule Place 18 mcg into inhaler and inhale daily.    . Tiotropium Bromide Monohydrate (SPIRIVA RESPIMAT) 1.25 MCG/ACT AERS Inhale 2 puffs into the lungs 2 (two) times daily.     No current facility-administered medications on file prior to visit.    No Known Allergies Family History  Problem Relation Age of Onset  . Diabetes Mother   . Hypertension Mother   . Diabetes Father   . Hypertension Father   . Diabetes Sister    PE: BP (!) 146/88 (BP Location: Left Arm, Patient Position: Sitting, Cuff Size: Large)   Pulse 85   Ht 5' 5.5" (1.664 m)   Wt 214 lb 9.6 oz (97.3 kg)   BMI 35.17 kg/m  Body mass index is 35.17 kg/m.  Wt Readings from Last 3 Encounters:  02/23/17 214 lb 9.6 oz (97.3 kg)  12/17/16 208 lb (94.3 kg)  11/23/16 205 lb (93 kg)   Constitutional: overweight, in NAD Eyes: PERRLA, EOMI, no exophthalmos ENT: moist mucous membranes, + L>R thyromegaly, no cervical  lymphadenopathy Cardiovascular: RRR, No MRG, + LE swelling - pitting - B Respiratory: CTA B Gastrointestinal: abdomen soft, NT, ND, BS+ Musculoskeletal: no deformities, strength intact in all 4 Skin: moist, warm, no rashes Neurological: no tremor with outstretched hands, DTR normal in all 4  ASSESSMENT: 1. Thyrotoxicosis  2. Large goiter US SOFT TISSUE HEAD AND NECK  Order: [id number]  Status:  Final result Visible to patient:  No (Not Released) Dx:  Goiter diffuse  Details   Reading Physician Reading Date Result Priority  Markus Daft, MD 05/12/2016   Narrative    CLINICAL DATA: Thyromegaly on previous chest CT.  EXAM: THYROID ULTRASOUND  TECHNIQUE: Ultrasound examination of the thyroid gland and adjacent soft tissues was performed.  COMPARISON: Chest CT 01/14/2016  FINDINGS: Parenchymal Echotexture: Moderately heterogenous  Isthmus: 1.2 cm in the AP dimension  Right lobe: 8.4 x 2.8 x 4.5 cm  Left lobe: 8.2 x 3.0 x 3.7 cm  _________________________________________________________  Estimated total number of nodules >/= 1 cm: 6-10  Number of spongiform nodules >/= 2 cm not described below (TR1): 0  Number of mixed cystic and solid nodules >/= 1.5 cm not described below ([place]): 0  _________________________________________________________  Nodule # 1:  Location: Isthmus; Inferior  Maximum size: 3.3 cm; Other 2 dimensions: 1.7 x 2.7 cm  Composition: solid/almost completely solid (2)  Echogenicity: isoechoic (1)  **Given size (>/= 2.5 cm) and appearance, fine needle aspiration of this mildly suspicious nodule should be considered based on TI-RADS criteria.  _________________________________________________________  Mildly complex cystic structure in the superior right thyroid lobe measuring up to 1.0 cm. Minimally complex cyst in the superior right thyroid lobe measuring up to 1.6 cm. Adjacent complex cysts or spongiform nodules in the mid right thyroid  lobe, largest measuring 1.1 cm. Spongiform nodule in the mid left thyroid lobe measuring up to 1.3 cm.  Nodule # 2:  Location: Left; Mid  Maximum size: 2.0 cm; Other 2 dimensions: 0.8 x 0.8 cm  Composition: mixed cystic and solid (1)  Echogenicity: cannot determine (1)  This nodule does NOT meet TI-RADS criteria for biopsy or dedicated follow-up.  _________________________________________________________  IMPRESSION: Thyroid tissue is diffusely heterogeneous and enlarged.  The isthmus  has a large isoechoic nodular structure. Unclear if this represents a discrete nodule versus focal heterogeneous thyroid tissue. This area measures up to 3.3 cm and recommend biopsy of this area.  Numerous small nodules and cysts throughout the left and right thyroid lobes. These predominantly cystic nodules do not meet criteria for biopsy or dedicated follow-up.  The above is in keeping with the ACR TI-RADS recommendations - J Am Coll Radiol 2017;14:587-595.   Electronically Signed By: Markus Daft M.D. On: 05/12/2016 12:03        Addendum: Adequacy Reason Satisfactory For Evaluation. Diagnosis THYROID, FINE NEEDLE ASPIRATION ISTHMUS INFERIOR (SPECIMEN 1 OF 1, COLLECTED ON 06/25/2016): CONSISTENT WITH BENIGN FOLLICULAR NODULE ([place] CATEGORY II). Casimer Lanius MD Pathologist, Electronic Signature (Case signed 06/26/2016) Specimen Clinical Information Nodule 1 Isthmus Inferior 3.3 cm; Other 2 dimensions: 1.7 x 2.7 cm, Solid / almost completely solid, Isoechoic, ACR TI-RADS total points: 3, Midly suspicious nodule Source Thyroid, Fine Needle Aspiration, Isthmus Inferior, (Specimen 1 of 1, colected on 06/25/16 )  3. DM2, insulin dependent, unconrolled  PLAN:  1. And 2.  -She has a history of thyrotoxicosis, per uptake and scan: Either mild Graves' disease or mild toxic multinodular goiter.  She is now off methimazole and the latest TFTs were normal in 08/2016. -She appears  euthyroid -We will recheck these at next visit  2. Large goiter -She has some neck compression symptoms but these are not very bothersome -We reviewed the latest thyroid ultrasound and biopsy.  The dominant nodule is 3.3 cm, benign  3. DM2 -Very uncontrolled, last HbA1c was undetectably high, >15% in 08/2016.  Last visit was in 10/2016, and she did not return in 1.5 months, as advised -Before last visit, we restarted insulin and sugars were much better with few hyperglycemic exceptions.  At that time, she ran out glipizide, and we restarted this at last visit.  I also advised her how to take this correctly, before, rather than after meals. - at this visit, sugars are better, still high later in the day, though >> discussed to add a GLP1 R agonist to help with postprandial sugars - discussed enefits and possible SEs of Trulicity - I advised her to: Patient Instructions  Please continue: - Lantus 50 units 2x a day - Glipizide 10 mg 2x a day before meals  Please start Trulicity 7.51 mg weekly. Let me know when you are close to running out to call in the higher dose to your pharmacy (1.5 mg).  Please return in 3 months with your sugar log.   - today, HbA1c is 10.9% (better!) - continue checking sugars at different times of the day - check 1-2x a day, rotating checks - advised for yearly eye exams >> she is UTD - Return to clinic in 3 mo with sugar log   CC: PCP: Gennette Pac, [place]  Philemon Kingdom, MD PhD [facility] Endocrinology

## 2017-02-25 ENCOUNTER — Other Ambulatory Visit: Payer: Self-pay

## 2017-02-25 ENCOUNTER — Telehealth: Payer: Self-pay

## 2017-02-25 MED ORDER — EXENATIDE ER 2 MG ~~LOC~~ PEN: 2.0000 mg | PEN_INJECTOR | SUBCUTANEOUS | 0 refills | Status: DC

## 2017-02-25 NOTE — Telephone Encounter (Signed)
She has had diarrhea from metformin and she also has chronic kidney disease.  Let us start a PA for Trulicity.Marland Kitchen

## 2017-02-25 NOTE — Telephone Encounter (Signed)
They will only approve a PA for Victoza, Byetta, or Bydeuron because those are preferred. Please advise if that is ok

## 2017-02-25 NOTE — Telephone Encounter (Signed)
Received fax from the pharmacy that Trulicity is not covered by Medicaid. They will not approve a PA for any preferred medications ( even preferred requires PA) in that catergory without documentation of a trial and failure of a metformin containing product. Please advise

## 2017-02-25 NOTE — Telephone Encounter (Signed)
PA faxed today to [facility]

## 2017-02-25 NOTE — Telephone Encounter (Signed)
We can try to send Bydureon 2 mg weekly.  The needle is thicker, though.  If she has any problems with this, we can try Victoza, but this is daily.

## 2017-03-05 ENCOUNTER — Telehealth: Payer: Self-pay | Admitting: Internal Medicine

## 2017-03-05 NOTE — Telephone Encounter (Signed)
Dulaglutide (TRULICITY) 4.35 TP/1.2QZ SOPN  Patient states that pharmacy needs a PA for this medication    [place] (N), [place]

## 2017-03-08 NOTE — Telephone Encounter (Signed)
Spoke to pharmacy this has been initiated

## 2017-04-06 ENCOUNTER — Telehealth: Payer: Self-pay | Admitting: Internal Medicine

## 2017-04-06 DIAGNOSIS — E108 Type 1 diabetes mellitus with unspecified complications: Secondary | ICD-10-CM

## 2017-04-06 MED ORDER — INSULIN GLARGINE 100 UNIT/ML SOLOSTAR PEN: 50.0000 [IU] | PEN_INJECTOR | Freq: Two times a day (BID) | SUBCUTANEOUS | 11 refills | Status: DC

## 2017-04-06 NOTE — Telephone Encounter (Signed)
Rx sent 

## 2017-04-06 NOTE — Telephone Encounter (Signed)
Patient need refills of her pen needles lantus solostar, send to   [place] (N), [place] - [place]

## 2017-04-07 ENCOUNTER — Telehealth: Payer: Self-pay | Admitting: Internal Medicine

## 2017-04-07 NOTE — Telephone Encounter (Signed)
Pharmacy sent form for coverage for Trulicity to Dr-Pharmacy is waiting for letter/form to be filled out by Dr. Cruzita Lederer and sent back to Pharmacy. Patient not sure if Medicaid covers Trulicity. Please call patient at ph# [phone] or Pharmacy ([facility] on [address]?) to advise. Patient needs RX for needles for insulin to be sent to above pharmacy

## 2017-04-09 ENCOUNTER — Telehealth: Payer: Self-pay | Admitting: Internal Medicine

## 2017-04-09 NOTE — Telephone Encounter (Signed)
Called patient.  No answer.

## 2017-04-09 NOTE — Telephone Encounter (Signed)
Patient is returning your call.  

## 2017-04-12 ENCOUNTER — Other Ambulatory Visit: Payer: Self-pay | Admitting: Internal Medicine

## 2017-04-12 NOTE — Telephone Encounter (Signed)
Called pt. No answer °

## 2017-04-13 NOTE — Telephone Encounter (Signed)
Spoke to patient. Advised per Loma Sousa L's note on 84/72/072 Trulicity PA initiated by pharmacy on 03/08/2017. Spoke to pharmacy. PA closed out b/c patient did not request med. Will restart PA. Informed pt.

## 2017-04-28 NOTE — Telephone Encounter (Addendum)
PA for Trulicity approved. XY#[id number] Approval date(s): 04/22/2017-04/23/2018 Notified pharmacy and patient.

## 2017-05-20 ENCOUNTER — Ambulatory Visit: Payer: Medicaid Other | Admitting: Ophthalmology

## 2017-05-24 ENCOUNTER — Encounter: Payer: Self-pay | Admitting: Internal Medicine

## 2017-05-24 ENCOUNTER — Ambulatory Visit (INDEPENDENT_AMBULATORY_CARE_PROVIDER_SITE_OTHER): Payer: Medicaid Other | Admitting: Internal Medicine

## 2017-05-24 VITALS — BP 118/88 | HR 84 | Ht 64.75 in | Wt 220.0 lb

## 2017-05-24 DIAGNOSIS — E1159 Type 2 diabetes mellitus with other circulatory complications: Secondary | ICD-10-CM

## 2017-05-24 DIAGNOSIS — E041 Nontoxic single thyroid nodule: Secondary | ICD-10-CM

## 2017-05-24 DIAGNOSIS — E108 Type 1 diabetes mellitus with unspecified complications: Secondary | ICD-10-CM

## 2017-05-24 DIAGNOSIS — E1165 Type 2 diabetes mellitus with hyperglycemia: Secondary | ICD-10-CM | POA: Diagnosis not present

## 2017-05-24 DIAGNOSIS — E049 Nontoxic goiter, unspecified: Secondary | ICD-10-CM

## 2017-05-24 DIAGNOSIS — E0590 Thyrotoxicosis, unspecified without thyrotoxic crisis or storm: Secondary | ICD-10-CM

## 2017-05-24 DIAGNOSIS — E040 Nontoxic diffuse goiter: Secondary | ICD-10-CM

## 2017-05-24 LAB — COMPLETE METABOLIC PANEL WITH GFR
AG Ratio: 1 (calc) (ref 1.0–2.5)
ALBUMIN MSPROF: 3.1 g/dL — AB (ref 3.6–5.1)
ALT: 14 U/L (ref 6–29)
AST: 15 U/L (ref 10–35)
Alkaline phosphatase (APISO): 94 U/L (ref 33–130)
BUN / CREAT RATIO: 13 (calc) (ref 6–22)
BUN: 20 mg/dL (ref 7–25)
CALCIUM: 9.1 mg/dL (ref 8.6–10.4)
CO2: 30 mmol/L (ref 20–32)
Chloride: 107 mmol/L (ref 98–110)
Creat: 1.51 mg/dL — ABNORMAL HIGH (ref 0.50–1.05)
GFR, EST NON AFRICAN AMERICAN: 38 mL/min/{1.73_m2} — AB (ref >=60)
GFR, Est African American: 44 mL/min/{1.73_m2} — ABNORMAL LOW (ref >=60)
GLOBULIN: 3.2 g/dL (ref 1.9–3.7)
Glucose, Bld: 53 mg/dL — ABNORMAL LOW (ref 65–99)
Potassium: 4.7 mmol/L (ref 3.5–5.3)
SODIUM: 143 mmol/L (ref 135–146)
Total Bilirubin: 0.4 mg/dL (ref 0.2–1.2)
Total Protein: 6.3 g/dL (ref 6.1–8.1)

## 2017-05-24 LAB — LIPID PANEL
Cholesterol: 193 mg/dL (ref 0–200)
HDL: 59.5 mg/dL (ref >39.00)
LDL Cholesterol: 108 mg/dL — ABNORMAL HIGH (ref 0–99)
NonHDL: 133.88
TRIGLYCERIDES: 127 mg/dL (ref 0.0–149.0)
Total CHOL/HDL Ratio: 3
VLDL: 25.4 mg/dL (ref 0.0–40.0)

## 2017-05-24 LAB — MICROALBUMIN / CREATININE URINE RATIO
Creatinine,U: 85 mg/dL
Microalb Creat Ratio: 479.7 mg/g — ABNORMAL HIGH (ref 0.0–30.0)
Microalb, Ur: 407.9 mg/dL — ABNORMAL HIGH (ref 0.0–1.9)

## 2017-05-24 LAB — T3, FREE: T3, Free: 3.7 pg/mL (ref 2.3–4.2)

## 2017-05-24 LAB — TSH: TSH: 0.03 u[IU]/mL — AB (ref 0.35–4.50)

## 2017-05-24 LAB — T4, FREE: FREE T4: 0.92 ng/dL (ref 0.60–1.60)

## 2017-05-24 LAB — POCT GLYCOSYLATED HEMOGLOBIN (HGB A1C): Hemoglobin A1C: 9.8

## 2017-05-24 MED ORDER — DULAGLUTIDE 1.5 MG/0.5ML ~~LOC~~ SOAJ: 1.5000 mg | SUBCUTANEOUS | 11 refills | Status: DC

## 2017-05-24 MED ORDER — INSULIN GLARGINE 100 UNIT/ML SOLOSTAR PEN: PEN_INJECTOR | SUBCUTANEOUS | 11 refills | Status: DC

## 2017-05-24 NOTE — Progress Notes (Signed)
Patient ID: Emily [name], female   DOB: 1960/03/09, 57 y.o.   MRN: [id number]   HPI  Emily [name] is a 57 y.o.-year-old female, returning for f/u for thyrotoxicosis/goiter and DM2, uncontrolled, insulin-dependent, with complications (CKD stage 3, cerebro-vascular ds. - s/p stroke). Last visit 3 months ago.  TMNG/Graves ds.: Reviewed and addended history: Pt was dx'ed with hyperthyroidism in ~2014 >> started MMI, lately 10 mg 2x daily. She was lost for f/u at [facility] as she could not travel over there.  Thyroid uptake and scan (06/22/2013) showed: FINDINGS: The 24 hour uptake was 24.7%. The normal reference range at 24 hours is 15 to 35%.   The thyroid scan demonstrates symmetric, mildly heterogenous distribution of radiopharmaceutical throughout the thyroid, which is increased in size.  IMPRESSION: Mildly heterogenous iodine uptake, cannot exclude toxic multinodular goiter versus possible mild Graves disease. Consider ultrasound for further correlation.  Patient was on methimazole before, however, this was stopped after  TSH returned very elevated, in the 90s (02/2015) >> TSH decreased to 8, then normalized >> we continued her off MMI.   However,she was started on MMI 10 mg 2x a day (re-Rx by Dr. Rosanna Randy: 04/17/2016, [facility]) - I do not think he was aware that we had stopped MMI.  We stopped methimazole again in 05/2016.  Patient's TFTs remained normal after we stopped methimazole: Lab Results  Component Value Date   TSH 0.54 08/20/2016   TSH 2.840 01/14/2016   TSH 3.20 12/10/2015   TSH 7.49 (H) 11/07/2015   TSH 2.35 07/31/2015   TSH 2.57 06/13/2015   TSH 8.090 (H) 04/09/2015   TSH 94.49 (A) 02/28/2015   FREET4 1.10 08/20/2016   FREET4 0.47 (L) 12/10/2015   FREET4 0.32 (L) 11/07/2015   FREET4 0.62 07/31/2015   FREET4 0.57 (L) 06/13/2015   FREET4 0.30 (L) 04/09/2015   Previously:   Thyroid U/S (05/12/2016): Large isthmic nodule Thyroid tissue is diffusely  heterogeneous and enlarged.  The isthmus has a large isoechoic nodular structure. Unclear if this represents a discrete nodule versus focal heterogeneous thyroid tissue. This area measures up to 3.3 cm and recommend biopsy of this area.  Numerous small nodules and cysts throughout the left and right thyroid lobes. These predominantly cystic nodules do not meet criteria for biopsy or dedicated follow-up.  FNA of the isthmic nodule (06/25/2016): Benign  Pt denies: - feeling nodules in neck - hoarseness - dysphagia - choking - SOB with lying down  Pt does have a FH of thyroid ds.: aunt. No FH of thyroid cancer. No h/o radiation tx to head or neck.  No seaweed or kelp. No recent contrast studies. No herbal supplements. No Biotin use. No recent steroids use.   DM2: -Very poorly controlled, with history of noncompliance - + complications: History of stroke, also has CKD  Last HbA1c was very high: Lab Results  Component Value Date   HGBA1C 10.9 02/23/2017   HGBA1C higher 15.0 08/20/2016   HGBA1C 12.9 (H) 05/14/2016   She was on: - Lantus 45 units 2x a day >> was off insulin - "the pharmacy did not receive it" - Januvia 50 mg in am >> stopped (?) - Glipizide 10 mg 2x a day >> started in the hospital We added Jardiance at last visit >> not taking  - "the pharmacy did not receive it" She was on Metformin >> diarrhea.  Now on: - Lantus 50 units 2x a day - Glipizide 10 mg 2x a day before meals -  Trulicity 2.77 mg weekly - started beginning 04/2017  She is checking sugars 1-2 times a day: - am: 299-449 >> 114-162, 233, 411, 463 >> 65, 85-216 >> 47, 56, 75-161, 229 - 2h after b'fast: n/c - lunch: n/c - 2h after lunch: n/c - dinner: n/c >> 80-305, 390 >> n/c - 2h after dinner: n/c - bedtime: n/c >> 361-595 >> 161-306, 479 >> n/c >> 101-275, HI  + CKD; BUN/Cr: Lab Results  Component Value Date   BUN 18 08/13/2016   Lab Results  Component Value Date   CREATININE 1.33 (H)  08/13/2016  On lisinopril 10.  + HL.  Latest lipid profile: Lab Results  Component Value Date   CHOL 125 05/14/2016   HDL 42 05/14/2016   LDLCALC 7 05/14/2016   TRIG 381 (H) 05/14/2016   CHOLHDL 3.0 05/14/2016  On Lipitor 40.  Latest dilated eye exam was in  11/2016: No DR  ROS: Constitutional: + weight gain/no weight loss, no fatigue, no subjective hyperthermia, no subjective hypothermia, + nocturia Eyes: no blurry vision, no xerophthalmia ENT: no sore throat, + see HPI Cardiovascular: no CP/+ SOB/no palpitations/+ leg swelling Respiratory: no cough/+ SOB/no wheezing Gastrointestinal: no N/no V/no D/no C/no acid reflux Musculoskeletal: + muscle aches/no joint aches Skin: no rashes, no hair loss Neurological: no tremors/no numbness/no tingling/no dizziness  I reviewed pt's medications, allergies, PMH, social hx, family hx, and changes were documented in the history of present illness. Otherwise, unchanged from my initial visit note.  Past Medical History:  Diagnosis Date  . Asthma   . COPD (chronic obstructive pulmonary disease) ([place])   . Diabetes mellitus without complication ([place])   . Hyperlipidemia   . Hypertension   . Stroke [facility])    Was told at recent hospital visit she has stroke in past  . Thyroid disease   . Thyroid nodule 04/30/2016   Past Surgical History:  Procedure Laterality Date  . arm surgery Left    Social History   Social History  . Marital Status: Single    Spouse Name: N/A  . Number of Children: 1   Occupational History  . None   Social History Main Topics  . Smoking status: Current Every Day Smoker -- 1.00 packs/day  . Smokeless tobacco: Not on file  . Alcohol Use: No  . Drug Use: No   Current Outpatient Medications on File Prior to Visit  Medication Sig Dispense Refill  . albuterol (PROVENTIL HFA;VENTOLIN HFA) 108 (90 Base) MCG/ACT inhaler Inhale 1-2 puffs into the lungs every 6 (six) hours as needed for wheezing or shortness of  breath. 1 Inhaler 11  . amLODipine (NORVASC) 5 MG tablet Take 1 tablet (5 mg total) by mouth daily. 90 tablet 1  . aspirin 81 MG tablet Take 81 mg by mouth daily.    Marland Kitchen atorvastatin (LIPITOR) 40 MG tablet TAKE ONE TABLET BY MOUTH EVERY DAY 90 tablet PRN  . carvedilol (COREG) 25 MG tablet TAKE 1 TABLET BY MOUTH TWICE DAILY 180 tablet 1  . cyclobenzaprine (FLEXERIL) 5 MG tablet TAKE ONE TABLET BY MOUTH EVERY DAY AS NEEDED FOR MUSCLE SPASMS 30 tablet 0  . Dulaglutide (TRULICITY) 8.24 MP/5.3IR SOPN Inject 0.75 mg weekly in am under skin 4 pen 1  . Exenatide ER (BYDUREON) 2 MG PEN Inject 2 mg into the skin once a week. 4 each 0  . glipiZIDE (GLUCOTROL) 10 MG tablet TAKE ONE TABLET BY MOUTH 2 TIMES A DAY before meals 180 tablet 3  .  Insulin Glargine (LANTUS SOLOSTAR) 100 UNIT/ML Solostar Pen Inject 50 Units into the skin 2 (two) times daily. 10 pen 11  . lisinopril (PRINIVIL,ZESTRIL) 10 MG tablet Take 1 tablet (10 mg total) by mouth daily. 30 tablet 3  . predniSONE (DELTASONE) 50 MG tablet Take 1 tablet (50 mg total) by mouth daily with breakfast. (Patient not taking: Reported on 11/23/2016) 5 tablet 0  . sitaGLIPtin (JANUVIA) 50 MG tablet Take 50 mg by mouth daily.    Marland Kitchen tiotropium (SPIRIVA) 18 MCG inhalation capsule Place 18 mcg into inhaler and inhale daily.    . Tiotropium Bromide Monohydrate (SPIRIVA RESPIMAT) 1.25 MCG/ACT AERS Inhale 2 puffs into the lungs 2 (two) times daily.     No current facility-administered medications on file prior to visit.    No Known Allergies Family History  Problem Relation Age of Onset  . Diabetes Mother   . Hypertension Mother   . Diabetes Father   . Hypertension Father   . Diabetes Sister    PE: BP 118/88 (BP Location: Left Arm, Patient Position: Sitting, Cuff Size: Large)   Pulse 84   Ht 5' 4.75" (1.645 m)   Wt 220 lb (99.8 kg)   SpO2 96%   BMI 36.89 kg/m  Body mass index is 36.89 kg/m.  Wt Readings from Last 3 Encounters:  05/24/17 220 lb (99.8  kg)  02/23/17 214 lb 9.6 oz (97.3 kg)  12/17/16 208 lb (94.3 kg)   Constitutional: overweight, in NAD Eyes: PERRLA, EOMI, no exophthalmos ENT: moist mucous membranes, + L>R thyromegaly, no cervical lymphadenopathy Cardiovascular: RRR, No MRG, + LE swelling - pitting - B Respiratory: CTA B Gastrointestinal: abdomen soft, NT, ND, BS+ Musculoskeletal: no deformities, strength intact in all 4 Skin: moist, warm, no rashes Neurological: no tremor with outstretched hands, DTR normal in all 4  ASSESSMENT: 1. Thyrotoxicosis  2. Large goiter US SOFT TISSUE HEAD AND NECK  Order: [id number]  Status:  Final result Visible to patient:  No (Not Released) Dx:  Goiter diffuse  Details   Reading Physician Reading Date Result Priority  Markus Daft, MD 05/12/2016   Narrative    CLINICAL DATA: Thyromegaly on previous chest CT.  EXAM: THYROID ULTRASOUND  TECHNIQUE: Ultrasound examination of the thyroid gland and adjacent soft tissues was performed.  COMPARISON: Chest CT 01/14/2016  FINDINGS: Parenchymal Echotexture: Moderately heterogenous  Isthmus: 1.2 cm in the AP dimension  Right lobe: 8.4 x 2.8 x 4.5 cm  Left lobe: 8.2 x 3.0 x 3.7 cm  _________________________________________________________  Estimated total number of nodules >/= 1 cm: 6-10  Number of spongiform nodules >/= 2 cm not described below (TR1): 0  Number of mixed cystic and solid nodules >/= 1.5 cm not described below ([place]): 0  _________________________________________________________  Nodule # 1:  Location: Isthmus; Inferior  Maximum size: 3.3 cm; Other 2 dimensions: 1.7 x 2.7 cm  Composition: solid/almost completely solid (2)  Echogenicity: isoechoic (1)  **Given size (>/= 2.5 cm) and appearance, fine needle aspiration of this mildly suspicious nodule should be considered based on TI-RADS criteria.  _________________________________________________________  Mildly complex cystic structure in the  superior right thyroid lobe measuring up to 1.0 cm. Minimally complex cyst in the superior right thyroid lobe measuring up to 1.6 cm. Adjacent complex cysts or spongiform nodules in the mid right thyroid lobe, largest measuring 1.1 cm. Spongiform nodule in the mid left thyroid lobe measuring up to 1.3 cm.  Nodule # 2:  Location: Left; Mid  Maximum size: 2.0  cm; Other 2 dimensions: 0.8 x 0.8 cm  Composition: mixed cystic and solid (1)  Echogenicity: cannot determine (1)  This nodule does NOT meet TI-RADS criteria for biopsy or dedicated follow-up.  _________________________________________________________  IMPRESSION: Thyroid tissue is diffusely heterogeneous and enlarged.  The isthmus has a large isoechoic nodular structure. Unclear if this represents a discrete nodule versus focal heterogeneous thyroid tissue. This area measures up to 3.3 cm and recommend biopsy of this area.  Numerous small nodules and cysts throughout the left and right thyroid lobes. These predominantly cystic nodules do not meet criteria for biopsy or dedicated follow-up.  The above is in keeping with the ACR TI-RADS recommendations - J Am Coll Radiol 2017;14:587-595.   Electronically Signed By: Markus Daft M.D. On: 05/12/2016 12:03        Addendum: Adequacy Reason Satisfactory For Evaluation. Diagnosis THYROID, FINE NEEDLE ASPIRATION ISTHMUS INFERIOR (SPECIMEN 1 OF 1, COLLECTED ON 06/25/2016): CONSISTENT WITH BENIGN FOLLICULAR NODULE ([place] CATEGORY II). Casimer Lanius MD Pathologist, Electronic Signature (Case signed 06/26/2016) Specimen Clinical Information Nodule 1 Isthmus Inferior 3.3 cm; Other 2 dimensions: 1.7 x 2.7 cm, Solid / almost completely solid, Isoechoic, ACR TI-RADS total points: 3, Midly suspicious nodule Source Thyroid, Fine Needle Aspiration, Isthmus Inferior, (Specimen 1 of 1, colected on 06/25/16 )  3. DM2, insulin dependent, uncontrolled  PLAN:  1. And 2.    -She has a history of thyrotoxicosis, per uptake and scan, this could be either mild Graves' disease or mild toxic multinodular goiter.  She is off methimazole and the latest TFTs were normal in 08/2016 -She appears euthyroid -We will recheck her TFTs today  2. Large goiter -She has minimal neck compression symptoms, not very bothersome -We reviewed together the latest thyroid ultrasound report and biopsy results.  The dominant nodule measures 3.3 cm and it is benign. -She will let me know if she develops more bothersome neck compression symptoms, in the meantime, we will just observe her.  3. DM2 -This is very uncontrolled, with the latest HbA1c 10.9%, which has decreased from >15% in 08/2016. -We started insulin at the end of last year and sugars improved, with only few hyperglycemic episodes. -At last visit, sugars were still later in the day so we added a GLP-1 receptor agonist to help with postprandial sugars. -At this visit, sugars are better, especially in the morning, when she occasionally has low blood sugars in the 40s and 50s.  We will decrease her Lantus dose at night from 50 units to 30 units. -Sugars still increase in a stepwise fashion throughout the day so we will increase her Trulicity to 1.5 mg weekly - I advised her to: Patient Instructions  Please continue: - Glipizide 10 mg 2x a day before meals  Please increase: - Trulicity to 1.5 mg weekly (use 2x 0.75 mg pens weekly until you run out)  Please decrease: - Lantus to 50 units in am but 30 units at bedtime  Please return in 3 months with your sugar log.  - today, HbA1c is 9.8% (better, but still high) - continue checking sugars at different times of the day - check 1-2x a day, rotating checks - advised for yearly eye exams >> she is UTD -We will check her annual labs today -she is fasting: Orders Placed This Encounter  Procedures  . TSH  . T4, free  . T3, free  . Microalbumin / creatinine urine ratio  .  COMPLETE METABOLIC PANEL WITH GFR  . Lipid panel  . POCT  glycosylated hemoglobin (Hb A1C)  - Return to clinic in 3 mo with sugar log    CC: PCP: Gennette Pac, [place] - [phone]  Office Visit on 05/24/2017  Component Date Value Ref Range Status  . TSH 05/24/2017 0.03* 0.35 - 4.50 uIU/mL Final  . Free T4 05/24/2017 0.92  0.60 - 1.60 ng/dL Final   Comment: Specimens from patients who are undergoing biotin therapy and /or ingesting biotin supplements may contain high levels of biotin.  The higher biotin concentration in these specimens interferes with this Free T4 assay.  Specimens that contain high levels  of biotin may cause false high results for this Free T4 assay.  Please interpret results in light of the total clinical presentation of the patient.    . T3, Free 05/24/2017 3.7  2.3 - 4.2 pg/mL Final  . Hemoglobin A1C 05/24/2017 9.8   Final  . Microalb, Ur 05/24/2017 407.9* 0.0 - 1.9 mg/dL Final  . Creatinine,U 05/24/2017 85.0  mg/dL Final  . Microalb Creat Ratio 05/24/2017 479.7* 0.0 - 30.0 mg/g Final  . Glucose, Bld 05/24/2017 53* 65 - 99 mg/dL Final   Comment: .            Fasting reference interval .   . BUN 05/24/2017 20  7 - 25 mg/dL Final  . Creat 05/24/2017 1.51* 0.50 - 1.05 mg/dL Final   Comment: For patients >82 years of age, the reference limit for Creatinine is approximately 13% higher for people identified as African-American. .   . GFR, Est Non African American 05/24/2017 38* > OR = 60 mL/min/1.36m2 Final  . GFR, Est African American 05/24/2017 44* > OR = 60 mL/min/1.75m2 Final  . BUN/Creatinine Ratio 05/24/2017 13  6 - 22 (calc) Final  . Sodium 05/24/2017 143  135 - 146 mmol/L Final  . Potassium 05/24/2017 4.7  3.5 - 5.3 mmol/L Final  . Chloride 05/24/2017 107  98 - 110 mmol/L Final  . CO2 05/24/2017 30  20 - 32 mmol/L Final  . Calcium 05/24/2017 9.1  8.6 - 10.4 mg/dL Final  . Total Protein 05/24/2017 6.3  6.1 - 8.1 g/dL Final  . Albumin 05/24/2017 3.1* 3.6 -  5.1 g/dL Final  . Globulin 05/24/2017 3.2  1.9 - 3.7 g/dL (calc) Final  . AG Ratio 05/24/2017 1.0  1.0 - 2.5 (calc) Final  . Total Bilirubin 05/24/2017 0.4  0.2 - 1.2 mg/dL Final  . Alkaline phosphatase (APISO) 05/24/2017 94  33 - 130 U/L Final  . AST 05/24/2017 15  10 - 35 U/L Final  . ALT 05/24/2017 14  6 - 29 U/L Final  . Cholesterol 05/24/2017 193  0 - 200 mg/dL Final   ATP III Classification       Desirable:  < 200 mg/dL               Borderline High:  200 - 239 mg/dL          High:  > = 240 mg/dL  . Triglycerides 05/24/2017 127.0  0.0 - 149.0 mg/dL Final   Normal:  <150 mg/dLBorderline High:  150 - 199 mg/dL  . HDL 05/24/2017 59.50  >39.00 mg/dL Final  . VLDL 05/24/2017 25.4  0.0 - 40.0 mg/dL Final  . LDL Cholesterol 05/24/2017 108* 0 - 99 mg/dL Final  . Total CHOL/HDL Ratio 05/24/2017 3   Final                  Men  Women1/2 Average Risk     3.4          3.3Average Risk          5.0          4.42X Average Risk          9.6          7.13X Average Risk          15.0          11.0                      . NonHDL 05/24/2017 133.88   Final   NOTE:  Non-HDL goal should be 30 mg/dL higher than patient's LDL goal (i.e. LDL goal of < 70 mg/dL, would have non-HDL goal of < 100 mg/dL)   Glu low >> we decreased Lantus dose. ACR high, but improved from 906 mg/g Cr. LDL close to goal. GFR still low. TSH suppressed. No free T4 and free T3. Will need to restart MMI 5 mg daily and check TFTs when she returns to see me.  Philemon Kingdom, MD PhD [facility]

## 2017-05-24 NOTE — Patient Instructions (Addendum)
Please continue: - Glipizide 10 mg 2x a day before meals  Please increase: - Trulicity to 1.5 mg weekly (use 2x 0.75 mg pens weekly until you run out)  Please decrease: - Lantus to 50 units in am but 30 units at bedtime  Please return in 3 months with your sugar log.

## 2017-05-25 ENCOUNTER — Telehealth: Payer: Self-pay

## 2017-05-25 MED ORDER — METHIMAZOLE 5 MG PO TABS: 5.0000 mg | ORAL_TABLET | Freq: Every day | ORAL | 1 refills | Status: DC

## 2017-05-25 NOTE — Telephone Encounter (Signed)
Spoke to patient. Gave lab results and med instructions. Pt verbalized understanding. Sent Methimazole Rx as requested.

## 2017-05-25 NOTE — Telephone Encounter (Signed)
-----   Message from Philemon Kingdom, MD sent at 05/25/2017 12:47 PM EDT ----- Larey Seat, can you please call pt:  Glu low >> we decreased Lantus dose. Urinary proteins are high, but improved from 2018. LDL close to goal. Kidney fxn still low. TSH suppressed. No free T4 and free T3. Will need to restart Methimazole 5 mg daily and check TFTs when she returns to see me. If she needs a Rx, can you please send this?

## 2017-06-04 ENCOUNTER — Telehealth: Payer: Self-pay | Admitting: Internal Medicine

## 2017-06-04 NOTE — Telephone Encounter (Signed)
Pt is aware that she will need to contact PCP for that

## 2017-06-04 NOTE — Telephone Encounter (Signed)
Patient stated she is needing a prescription sent in for cholesterol medication and would like to know if dr Cruzita Lederer can send this in or if PCP needs to Please advise

## 2017-06-07 ENCOUNTER — Telehealth: Payer: Self-pay | Admitting: Internal Medicine

## 2017-06-07 NOTE — Telephone Encounter (Signed)
Patient stated she is having trouble picking up her insulin Trulicity

## 2017-09-27 ENCOUNTER — Encounter: Payer: Self-pay | Admitting: Internal Medicine

## 2017-09-27 ENCOUNTER — Ambulatory Visit (INDEPENDENT_AMBULATORY_CARE_PROVIDER_SITE_OTHER): Payer: Medicaid Other | Admitting: Internal Medicine

## 2017-09-27 VITALS — BP 128/88 | HR 86 | Ht 64.75 in | Wt 216.0 lb

## 2017-09-27 DIAGNOSIS — E049 Nontoxic goiter, unspecified: Secondary | ICD-10-CM

## 2017-09-27 DIAGNOSIS — E040 Nontoxic diffuse goiter: Secondary | ICD-10-CM

## 2017-09-27 DIAGNOSIS — Z23 Encounter for immunization: Secondary | ICD-10-CM | POA: Diagnosis not present

## 2017-09-27 DIAGNOSIS — E0590 Thyrotoxicosis, unspecified without thyrotoxic crisis or storm: Secondary | ICD-10-CM | POA: Diagnosis not present

## 2017-09-27 DIAGNOSIS — E1165 Type 2 diabetes mellitus with hyperglycemia: Secondary | ICD-10-CM | POA: Diagnosis not present

## 2017-09-27 DIAGNOSIS — E1159 Type 2 diabetes mellitus with other circulatory complications: Secondary | ICD-10-CM | POA: Diagnosis not present

## 2017-09-27 LAB — POCT GLYCOSYLATED HEMOGLOBIN (HGB A1C): HEMOGLOBIN A1C: 8.6 % — AB (ref 4.0–5.6)

## 2017-09-27 LAB — T3, FREE: T3 FREE: 3.5 pg/mL (ref 2.3–4.2)

## 2017-09-27 LAB — T4, FREE: FREE T4: 0.71 ng/dL (ref 0.60–1.60)

## 2017-09-27 LAB — TSH: TSH: 1.61 u[IU]/mL (ref 0.35–4.50)

## 2017-09-27 MED ORDER — DULAGLUTIDE 1.5 MG/0.5ML ~~LOC~~ SOAJ: 1.5000 mg | SUBCUTANEOUS | 3 refills | Status: DC

## 2017-09-27 MED ORDER — EMPAGLIFLOZIN 10 MG PO TABS: 10.0000 mg | ORAL_TABLET | Freq: Every day | ORAL | 11 refills | Status: DC

## 2017-09-27 NOTE — Patient Instructions (Addendum)
Please continue: - Glipizide 10 mg 2x a day before meals - Trulicity 1.5 mg weekly - Lantus 50 units in a.m. and 30 units at bedtime  Please add: - Jardiance 10 mg daily before b'fast  Please stop at the lab.  Please continue: - Methimazole 5 mg daily  Please return in 3 months with your sugar log.

## 2017-09-27 NOTE — Progress Notes (Signed)
Patient ID: Emily [name], female   DOB: 1960/12/07, 57 y.o.   MRN: [id number]   HPI  Emily [name] is a 57 y.o.-year-old female, returning for f/u for thyrotoxicosis/goiter and DM2, uncontrolled, insulin-dependent, with complications (CKD stage 3, cerebro-vascular ds. - s/p stroke). Last visit 3 months ago.  TMNG/Graves ds.: Reviewed and addended history: Pt was dx'ed with hyperthyroidism in ~2014 >> started MMI, lately 10 mg 2x daily. She was lost for f/u at [facility] as she could not travel over there.  Thyroid uptake and scan (06/22/2013) showed: FINDINGS: The 24 hour uptake was 24.7%. The normal reference range at 24 hours is 15 to 35%.   The thyroid scan demonstrates symmetric, mildly heterogenous distribution of radiopharmaceutical throughout the thyroid, which is increased in size.  IMPRESSION: Mildly heterogenous iodine uptake, cannot exclude toxic multinodular goiter versus possible mild Graves disease. Consider ultrasound for further correlation.  Patient was on methimazole before, however, this was stopped after  TSH returned very elevated, in the 90s (02/2015) >> TSH decreased to 8, then normalized >> we continued her off MMI.   However,she was started on MMI 10 mg 2x a day (re-Rx by Dr. Rosanna Randy: 04/17/2016, [facility]) - I do not think he was aware that we had stopped MMI.  We stopped methimazole again in 05/2016.  However, we had to restart again methimazole in 05/2017.  She is currently taking 5 mg once a day.  Reviewed patient's TFTs: Lab Results  Component Value Date   TSH 0.03 (L) 05/24/2017   TSH 0.54 08/20/2016   TSH 2.840 01/14/2016   TSH 3.20 12/10/2015   TSH 7.49 (H) 11/07/2015   TSH 2.35 07/31/2015   TSH 2.57 06/13/2015   TSH 8.090 (H) 04/09/2015   TSH 94.49 (A) 02/28/2015   FREET4 0.92 05/24/2017   FREET4 1.10 08/20/2016   FREET4 0.47 (L) 12/10/2015   FREET4 0.32 (L) 11/07/2015   FREET4 0.62 07/31/2015   FREET4 0.57 (L) 06/13/2015   FREET4  0.30 (L) 04/09/2015   Previously:   Thyroid U/S (05/12/2016): Large isthmic nodule Thyroid tissue is diffusely heterogeneous and enlarged.  The isthmus has a large isoechoic nodular structure. Unclear if this represents a discrete nodule versus focal heterogeneous thyroid tissue. This area measures up to 3.3 cm and recommend biopsy of this area.  Numerous small nodules and cysts throughout the left and right thyroid lobes. These predominantly cystic nodules do not meet criteria for biopsy or dedicated follow-up.  FNA of the isthmic nodule (06/25/2016): Benign  Pt denies: - feeling nodules in neck - hoarseness - dysphagia - choking - SOB with lying down  Pt does have a FH of thyroid ds.: aunt. No FH of thyroid cancer. No h/o radiation tx to head or neck.  No seaweed or kelp. No recent contrast studies. No herbal supplements. No Biotin use. No recent steroids use.   DM2: -Poorly controlled, with history of noncompliance with medicines -+ Complications:: History of stroke, also has CKD  Reviewed HbA1c levels: Lab Results  Component Value Date   HGBA1C 9.8 05/24/2017   HGBA1C 10.9 02/23/2017   HGBA1C higher 15.0 08/20/2016   She was on: - Lantus 45 units 2x a day >> was off insulin - "the pharmacy did not receive it" - Januvia 50 mg in am >> stopped (?) - Glipizide 10 mg 2x a day >> started in the hospital We added Jardiance at last visit >> not taking  - "the pharmacy did not receive it" She was on  Metformin >> diarrhea.  Now on: - Glipizide 10 mg 2x a day before meals - Trulicity 1.5 mg weekly -started 04/2017 - Lantus 50 units in a.m. and 30 units at bedtime  She is checking sugars 1-2 times a day: - am:  47, 56, 75-161, 229 >> 105-207, 256, 314 - 2h after b'fast: n/c - lunch: n/c - 2h after lunch: n/c - dinner: n/c >> 80-305, 390 >> n/c - 2h after dinner: n/c - bedtime: 101-275, HI >> 111-HI (834-196)  + CKD; BUN/Cr: Lab Results  Component Value Date    BUN 20 05/24/2017   Lab Results  Component Value Date   CREATININE 1.51 (H) 05/24/2017  On lisinopril 10.  + HL.  Latest lipid profile: Lab Results  Component Value Date   CHOL 193 05/24/2017   HDL 59.50 05/24/2017   LDLCALC 108 (H) 05/24/2017   TRIG 127.0 05/24/2017   CHOLHDL 3 05/24/2017  On Lipitor 40.  Latest dilated eye exam was in 11/2016: No DR  ROS: Constitutional:+  weight gain/no weight loss, no fatigue, no subjective hyperthermia, no subjective hypothermia, + nocturia Eyes: no blurry vision, no xerophthalmia ENT: no sore throat, + see HPI Cardiovascular: no CP/no SOB/no palpitations/no leg swelling Respiratory: no cough/no SOB/no wheezing Gastrointestinal: no N/no V/no D/no C/no acid reflux Musculoskeletal: no muscle aches/no joint aches Skin: no rashes, no hair loss Neurological: no tremors/no numbness/no tingling/no dizziness  I reviewed pt's medications, allergies, PMH, social hx, family hx, and changes were documented in the history of present illness. Otherwise, unchanged from my initial visit note.  Past Medical History:  Diagnosis Date  . Asthma   . COPD (chronic obstructive pulmonary disease) ([place])   . Diabetes mellitus without complication ([place])   . Hyperlipidemia   . Hypertension   . Stroke [facility])    Was told at recent hospital visit she has stroke in past  . Thyroid disease   . Thyroid nodule 04/30/2016   Past Surgical History:  Procedure Laterality Date  . arm surgery Left    Social History   Social History  . Marital Status: Single    Spouse Name: N/A  . Number of Children: 1   Occupational History  . None   Social History Main Topics  . Smoking status: Current Every Day Smoker -- 1.00 packs/day  . Smokeless tobacco: Not on file  . Alcohol Use: No  . Drug Use: No   Current Outpatient Medications on File Prior to Visit  Medication Sig Dispense Refill  . albuterol (PROVENTIL HFA;VENTOLIN HFA) 108 (90 Base) MCG/ACT inhaler Inhale  1-2 puffs into the lungs every 6 (six) hours as needed for wheezing or shortness of breath. 1 Inhaler 11  . amLODipine (NORVASC) 10 MG tablet Take 10 mg by mouth daily.    Marland Kitchen aspirin 81 MG tablet Take 81 mg by mouth daily.    Marland Kitchen atorvastatin (LIPITOR) 40 MG tablet TAKE ONE TABLET BY MOUTH EVERY DAY 90 tablet PRN  . carvedilol (COREG) 25 MG tablet TAKE 1 TABLET BY MOUTH TWICE DAILY 180 tablet 1  . cyclobenzaprine (FLEXERIL) 5 MG tablet TAKE ONE TABLET BY MOUTH EVERY DAY AS NEEDED FOR MUSCLE SPASMS 30 tablet 0  . Dulaglutide (TRULICITY) 1.5 QI/2.9NL SOPN Inject 1.5 mg into the skin once a week. 4 pen 11  . glipiZIDE (GLUCOTROL) 10 MG tablet TAKE ONE TABLET BY MOUTH 2 TIMES A DAY before meals 180 tablet 3  . Insulin Glargine (LANTUS SOLOSTAR) 100 UNIT/ML Solostar Pen Inject under  skin 50 units in am and 30 units at night 10 pen 11  . lisinopril (PRINIVIL,ZESTRIL) 10 MG tablet Take 1 tablet (10 mg total) by mouth daily. 30 tablet 3  . methimazole (TAPAZOLE) 5 MG tablet Take 1 tablet (5 mg total) by mouth daily. 90 tablet 1  . tiotropium (SPIRIVA) 18 MCG inhalation capsule Place 18 mcg into inhaler and inhale daily.    . Tiotropium Bromide Monohydrate (SPIRIVA RESPIMAT) 1.25 MCG/ACT AERS Inhale 2 puffs into the lungs 2 (two) times daily.     No current facility-administered medications on file prior to visit.    No Known Allergies Family History  Problem Relation Age of Onset  . Diabetes Mother   . Hypertension Mother   . Diabetes Father   . Hypertension Father   . Diabetes Sister    PE: BP 128/88   Pulse 86   Ht 5' 4.75" (1.645 m)   Wt 216 lb (98 kg)   SpO2 98%   BMI 36.22 kg/m  Body mass index is 36.89 kg/m.  Wt Readings from Last 3 Encounters:  09/27/17 216 lb (98 kg)  05/24/17 220 lb (99.8 kg)  02/23/17 214 lb 9.6 oz (97.3 kg)   Constitutional: overweight, in NAD Eyes: PERRLA, EOMI, no exophthalmos ENT: moist mucous membranes, + L>R thyromegaly, no cervical  lymphadenopathy Cardiovascular: RRR, No MRG, + bilateral LE pitting edema Respiratory: CTA B Gastrointestinal: abdomen soft, NT, ND, BS+ Musculoskeletal: no deformities, strength intact in all 4 Skin: moist, warm, no rashes Neurological: no tremor with outstretched hands, DTR normal in all 4  ASSESSMENT: 1. Thyrotoxicosis  2. Large goiter US SOFT TISSUE HEAD AND NECK  Order: [id number]  Status:  Final result Visible to patient:  No (Not Released) Dx:  Goiter diffuse  Details   Reading Physician Reading Date Result Priority  Markus Daft, MD 05/12/2016   Narrative    CLINICAL DATA: Thyromegaly on previous chest CT.  EXAM: THYROID ULTRASOUND  TECHNIQUE: Ultrasound examination of the thyroid gland and adjacent soft tissues was performed.  COMPARISON: Chest CT 01/14/2016  FINDINGS: Parenchymal Echotexture: Moderately heterogenous  Isthmus: 1.2 cm in the AP dimension  Right lobe: 8.4 x 2.8 x 4.5 cm  Left lobe: 8.2 x 3.0 x 3.7 cm  _________________________________________________________  Estimated total number of nodules >/= 1 cm: 6-10  Number of spongiform nodules >/= 2 cm not described below (TR1): 0  Number of mixed cystic and solid nodules >/= 1.5 cm not described below ([place]): 0  _________________________________________________________  Nodule # 1:  Location: Isthmus; Inferior  Maximum size: 3.3 cm; Other 2 dimensions: 1.7 x 2.7 cm  Composition: solid/almost completely solid (2)  Echogenicity: isoechoic (1)  **Given size (>/= 2.5 cm) and appearance, fine needle aspiration of this mildly suspicious nodule should be considered based on TI-RADS criteria.  _________________________________________________________  Mildly complex cystic structure in the superior right thyroid lobe measuring up to 1.0 cm. Minimally complex cyst in the superior right thyroid lobe measuring up to 1.6 cm. Adjacent complex cysts or spongiform nodules in the mid right  thyroid lobe, largest measuring 1.1 cm. Spongiform nodule in the mid left thyroid lobe measuring up to 1.3 cm.  Nodule # 2:  Location: Left; Mid  Maximum size: 2.0 cm; Other 2 dimensions: 0.8 x 0.8 cm  Composition: mixed cystic and solid (1)  Echogenicity: cannot determine (1)  This nodule does NOT meet TI-RADS criteria for biopsy or dedicated follow-up.  _________________________________________________________  IMPRESSION: Thyroid tissue is diffusely heterogeneous and  enlarged.  The isthmus has a large isoechoic nodular structure. Unclear if this represents a discrete nodule versus focal heterogeneous thyroid tissue. This area measures up to 3.3 cm and recommend biopsy of this area.  Numerous small nodules and cysts throughout the left and right thyroid lobes. These predominantly cystic nodules do not meet criteria for biopsy or dedicated follow-up.  The above is in keeping with the ACR TI-RADS recommendations - J Am Coll Radiol 2017;14:587-595.   Electronically Signed By: Markus Daft M.D. On: 05/12/2016 12:03        Addendum: Adequacy Reason Satisfactory For Evaluation. Diagnosis THYROID, FINE NEEDLE ASPIRATION ISTHMUS INFERIOR (SPECIMEN 1 OF 1, COLLECTED ON 06/25/2016): CONSISTENT WITH BENIGN FOLLICULAR NODULE ([place] CATEGORY II). Casimer Lanius MD Pathologist, Electronic Signature (Case signed 06/26/2016) Specimen Clinical Information Nodule 1 Isthmus Inferior 3.3 cm; Other 2 dimensions: 1.7 x 2.7 cm, Solid / almost completely solid, Isoechoic, ACR TI-RADS total points: 3, Midly suspicious nodule Source Thyroid, Fine Needle Aspiration, Isthmus Inferior, (Specimen 1 of 1, colected on 06/25/16 )  3. DM2, insulin dependent, uncontrolled  PLAN:  1. And 2.  -She has a history of thyrotoxicosis, per uptake and scan, this could be either mild Graves' disease or mild multinodular goiter. -At last visit, TSH was decreased after an approximately 1 year.  Off  methimazole, so we had to restart methimazole at 5 mg daily.  She continues on this dose now. -She appears euthyroid -We will recheck her TFTs today   2. Large goiter -She has minimal neck compression symptoms, not very bothersome -Reviewed the latest thyroid ultrasound report and biopsy results.  The dominant nodule measures 3.3 cm and it is benign -For now, we will just observe her  3. DM2 -Patient with long-standing, uncontrolled, type 2 diabetes, on long-acting insulin, sulfonylurea, and GLP-1 receptor agonist.  Her HbA1c decreased significantly since a year ago when her HbA1c was undetectably high.  At last visit, this was 9.8%, better, but still high.  We started insulin at the end of 2018 and then we added a GLP-1 receptor agonist.  At last visit, we increased Trulicity and decreased Lantus as she needed more help with her postprandial sugars but had lows in the 40s and 50s between meals. -Reviewing her log, her sugars were much better in 05/2017, but they worsened afterwards, with more high sugars even undetectably high after dinner and subsequently higher sugars in the morning.  We discussed about limiting her snacking after dinner, which she continues to do but I also advised her to add Jardiance to her regimen, a low dose, due to her CKD, as this will help with postprandial hyperglycemia - We will need to check her potassium and kidney function at next visit - I advised her to: Patient Instructions  Please continue: - Glipizide 10 mg 2x a day before meals - Trulicity 1.5 mg weekly - Lantus 50 units in a.m. and 30 units at bedtime  Please add: - Jardiance 10 mg daily before b'fast  Please stop at the lab.  Please continue: - Methimazole 5 mg daily  Please return in 3 months with your sugar log.  - today, HbA1c is 8.6% (better) - continue checking sugars at different times of the day - check 1-2x a day, rotating checks - advised for yearly eye exams >> she is UTD - Return to  clinic in 3 mo with sugar log     CC: PCP: Gennette Pac, [place] - [phone]  Office Visit on 09/27/2017  Component Date  Value Ref Range Status  . TSH 09/27/2017 1.61  0.35 - 4.50 uIU/mL Final  . Free T4 09/27/2017 0.71  0.60 - 1.60 ng/dL Final   Comment: Specimens from patients who are undergoing biotin therapy and /or ingesting biotin supplements may contain high levels of biotin.  The higher biotin concentration in these specimens interferes with this Free T4 assay.  Specimens that contain high levels  of biotin may cause false high results for this Free T4 assay.  Please interpret results in light of the total clinical presentation of the patient.    . T3, Free 09/27/2017 3.5  2.3 - 4.2 pg/mL Final  . Hemoglobin A1C 09/27/2017 8.6* 4.0 - 5.6 % Final   TFTs are now great.  We will continue the same dose of methimazole for now, 5 mg daily and recheck the test in 3 months.  At that time, we may need to reduce the methimazole to 2.5 mg daily.  Philemon Kingdom, MD PhD [facility]

## 2017-09-28 ENCOUNTER — Telehealth: Payer: Self-pay

## 2017-09-28 NOTE — Telephone Encounter (Signed)
OK, let's try that

## 2017-09-28 NOTE — Telephone Encounter (Signed)
Jardiance 10 mg requires PA for Medicaid.  Please advise.

## 2017-09-29 NOTE — Telephone Encounter (Signed)
PA filled out and given to Dr. Cruzita Lederer to sign.

## 2017-09-30 NOTE — Telephone Encounter (Signed)
PA faxed

## 2017-10-01 ENCOUNTER — Telehealth: Payer: Self-pay | Admitting: Internal Medicine

## 2017-10-01 NOTE — Telephone Encounter (Signed)
Pt calling    Pt went to pharmacy

## 2017-10-06 ENCOUNTER — Telehealth: Payer: Self-pay | Admitting: Internal Medicine

## 2017-10-06 NOTE — Telephone Encounter (Signed)
Patient is stating that the [place] does not have the authroization for her medication. She does not know what it is called. Believe it could be empagliflozin (JARDIANCE) 10 MG TABS tablet.  Please advise. Ph # [phone]

## 2017-10-07 ENCOUNTER — Telehealth: Payer: Self-pay | Admitting: Internal Medicine

## 2017-10-07 MED ORDER — DAPAGLIFLOZIN PROPANEDIOL 5 MG PO TABS: 5.0000 mg | ORAL_TABLET | Freq: Every day | ORAL | 1 refills | Status: DC

## 2017-10-07 NOTE — Telephone Encounter (Signed)
Let's try  Farxiga 5.

## 2017-10-07 NOTE — Telephone Encounter (Signed)
New Rx sent, I tried calling patient, phone rang several times without answer or VM.

## 2017-10-07 NOTE — Telephone Encounter (Signed)
Patient states Jardiance too expensive. Insurance denied. She needs something cheaper. Worthington Burl. Please call patient back.

## 2017-10-07 NOTE — Telephone Encounter (Signed)
Please advise 

## 2017-10-07 NOTE — Telephone Encounter (Signed)
Patient called back, I relayed the message and she will contact pharmacy to see if medication is ok to pick up

## 2017-10-07 NOTE — Telephone Encounter (Signed)
See other note, PA denied.

## 2017-10-08 ENCOUNTER — Telehealth: Payer: Self-pay | Admitting: Internal Medicine

## 2017-10-08 NOTE — Telephone Encounter (Signed)
Please advise 

## 2017-10-08 NOTE — Telephone Encounter (Signed)
Can she please check with her insurance whether Invokana or Steglatro are covered - these are in the same class.

## 2017-10-08 NOTE — Telephone Encounter (Signed)
Pt stated her insurance wont pay for her medication Sarxiga. Pt want to see if there is another medication she can take

## 2017-10-11 NOTE — Telephone Encounter (Signed)
Pt stated the last medication that was sent to the pharmacy her insurance isn't paying for that either. Please advise

## 2017-10-11 NOTE — Addendum Note (Signed)
Addended by: Cardell Peach I on: 10/11/2017 03:47 PM   Modules accepted: Orders

## 2017-10-11 NOTE — Telephone Encounter (Signed)
Called patient's insurance and they did a PA over the phone with me for the Jardiance and it was approved.  I called the pharmacy to let them know to fill the Jardience and let pt know as well

## 2017-10-14 ENCOUNTER — Other Ambulatory Visit: Payer: Self-pay | Admitting: Internal Medicine

## 2017-11-04 ENCOUNTER — Other Ambulatory Visit: Payer: Self-pay | Admitting: Internal Medicine

## 2017-11-11 ENCOUNTER — Telehealth: Payer: Self-pay | Admitting: Internal Medicine

## 2017-11-11 NOTE — Telephone Encounter (Signed)
Patient has called stating she would like to be seen sooner because she thinks her goiter is coming back. Patients next appoint is 12/30/17. Ph # [phone]

## 2017-11-12 ENCOUNTER — Emergency Department: Payer: Medicaid Other

## 2017-11-12 ENCOUNTER — Encounter: Payer: Self-pay | Admitting: Emergency Medicine

## 2017-11-12 ENCOUNTER — Other Ambulatory Visit: Payer: Self-pay

## 2017-11-12 ENCOUNTER — Inpatient Hospital Stay
Admission: EM | Admit: 2017-11-12 | Discharge: 2017-11-18 | DRG: 193 | Disposition: A | Discharge status: Home Health | Payer: Medicaid Other | Attending: Internal Medicine | Admitting: Internal Medicine

## 2017-11-12 DIAGNOSIS — J181 Lobar pneumonia, unspecified organism: Secondary | ICD-10-CM | POA: Diagnosis present

## 2017-11-12 DIAGNOSIS — Z7982 Long term (current) use of aspirin: Secondary | ICD-10-CM | POA: Diagnosis not present

## 2017-11-12 DIAGNOSIS — N183 Chronic kidney disease, stage 3 (moderate): Secondary | ICD-10-CM | POA: Diagnosis present

## 2017-11-12 DIAGNOSIS — Z87891 Personal history of nicotine dependence: Secondary | ICD-10-CM | POA: Diagnosis not present

## 2017-11-12 DIAGNOSIS — Z6836 Body mass index (BMI) 36.0-36.9, adult: Secondary | ICD-10-CM

## 2017-11-12 DIAGNOSIS — N179 Acute kidney failure, unspecified: Secondary | ICD-10-CM | POA: Diagnosis present

## 2017-11-12 DIAGNOSIS — E785 Hyperlipidemia, unspecified: Secondary | ICD-10-CM | POA: Diagnosis present

## 2017-11-12 DIAGNOSIS — J441 Chronic obstructive pulmonary disease with (acute) exacerbation: Secondary | ICD-10-CM | POA: Diagnosis present

## 2017-11-12 DIAGNOSIS — E875 Hyperkalemia: Secondary | ICD-10-CM | POA: Diagnosis not present

## 2017-11-12 DIAGNOSIS — Z79899 Other long term (current) drug therapy: Secondary | ICD-10-CM

## 2017-11-12 DIAGNOSIS — E1122 Type 2 diabetes mellitus with diabetic chronic kidney disease: Secondary | ICD-10-CM | POA: Diagnosis present

## 2017-11-12 DIAGNOSIS — J440 Chronic obstructive pulmonary disease with acute lower respiratory infection: Secondary | ICD-10-CM | POA: Diagnosis present

## 2017-11-12 DIAGNOSIS — E669 Obesity, unspecified: Secondary | ICD-10-CM | POA: Diagnosis present

## 2017-11-12 DIAGNOSIS — Z8673 Personal history of transient ischemic attack (TIA), and cerebral infarction without residual deficits: Secondary | ICD-10-CM

## 2017-11-12 DIAGNOSIS — I129 Hypertensive chronic kidney disease with stage 1 through stage 4 chronic kidney disease, or unspecified chronic kidney disease: Secondary | ICD-10-CM | POA: Diagnosis present

## 2017-11-12 DIAGNOSIS — Z794 Long term (current) use of insulin: Secondary | ICD-10-CM | POA: Diagnosis not present

## 2017-11-12 DIAGNOSIS — R6520 Severe sepsis without septic shock: Secondary | ICD-10-CM

## 2017-11-12 DIAGNOSIS — Z7951 Long term (current) use of inhaled steroids: Secondary | ICD-10-CM

## 2017-11-12 DIAGNOSIS — J9601 Acute respiratory failure with hypoxia: Secondary | ICD-10-CM | POA: Diagnosis present

## 2017-11-12 DIAGNOSIS — E0500 Thyrotoxicosis with diffuse goiter without thyrotoxic crisis or storm: Secondary | ICD-10-CM | POA: Diagnosis present

## 2017-11-12 DIAGNOSIS — O85 Puerperal sepsis: Secondary | ICD-10-CM

## 2017-11-12 LAB — CBC WITH DIFFERENTIAL/PLATELET
Abs Immature Granulocytes: 0.02 10*3/uL (ref 0.00–0.07)
Basophils Absolute: 0 10*3/uL (ref 0.0–0.1)
Basophils Relative: 1 %
Eosinophils Absolute: 0.2 10*3/uL (ref 0.0–0.5)
Eosinophils Relative: 2 %
HCT: 50.3 % — ABNORMAL HIGH (ref 36.0–46.0)
Hemoglobin: 15 g/dL (ref 12.0–15.0)
Immature Granulocytes: 0 %
Lymphocytes Relative: 30 %
Lymphs Abs: 2.5 10*3/uL (ref 0.7–4.0)
MCH: 29.5 pg (ref 26.0–34.0)
MCHC: 29.8 g/dL — ABNORMAL LOW (ref 30.0–36.0)
MCV: 98.8 fL (ref 80.0–100.0)
Monocytes Absolute: 0.7 10*3/uL (ref 0.1–1.0)
Monocytes Relative: 9 %
Neutro Abs: 4.8 10*3/uL (ref 1.7–7.7)
Neutrophils Relative %: 58 %
Platelets: 183 10*3/uL (ref 150–400)
RBC: 5.09 MIL/uL (ref 3.87–5.11)
RDW: 14.7 % (ref 11.5–15.5)
WBC: 8.3 10*3/uL (ref 4.0–10.5)
nRBC: 1.1 % — ABNORMAL HIGH (ref 0.0–0.2)

## 2017-11-12 LAB — COMPREHENSIVE METABOLIC PANEL
ALT: 17 U/L (ref 0–44)
AST: 18 U/L (ref 15–41)
Albumin: 3.2 g/dL — ABNORMAL LOW (ref 3.5–5.0)
Alkaline Phosphatase: 98 U/L (ref 38–126)
Anion gap: 7 (ref 5–15)
BUN: 17 mg/dL (ref 6–20)
CO2: 29 mmol/L (ref 22–32)
Calcium: 8.7 mg/dL — ABNORMAL LOW (ref 8.9–10.3)
Chloride: 107 mmol/L (ref 98–111)
Creatinine, Ser: 1.6 mg/dL — ABNORMAL HIGH (ref 0.44–1.00)
GFR calc Af Amer: 40 mL/min — ABNORMAL LOW (ref >60)
GFR calc non Af Amer: 34 mL/min — ABNORMAL LOW (ref >60)
Glucose, Bld: 90 mg/dL (ref 70–99)
Potassium: 4.1 mmol/L (ref 3.5–5.1)
Sodium: 143 mmol/L (ref 135–145)
Total Bilirubin: 0.8 mg/dL (ref 0.3–1.2)
Total Protein: 7.2 g/dL (ref 6.5–8.1)

## 2017-11-12 LAB — GLUCOSE, CAPILLARY
GLUCOSE-CAPILLARY: 65 mg/dL — AB (ref 70–99)
GLUCOSE-CAPILLARY: 197 mg/dL — AB (ref 70–99)
Glucose-Capillary: 91 mg/dL (ref 70–99)

## 2017-11-12 LAB — INFLUENZA PANEL BY PCR (TYPE A & B)
INFLBPCR: NEGATIVE
Influenza A By PCR: NEGATIVE

## 2017-11-12 MED ORDER — ACETAMINOPHEN 650 MG RE SUPP: 650.0000 mg | Freq: Four times a day (QID) | RECTAL | Status: DC | PRN

## 2017-11-12 MED ORDER — INSULIN GLARGINE 100 UNIT/ML ~~LOC~~ SOLN
50.0000 [IU] | Freq: Every morning | SUBCUTANEOUS | Status: DC
  Administered 2017-11-13 – 2017-11-15 (×3): 50 [IU] via SUBCUTANEOUS
  Filled 2017-11-12 (×3): qty 0.5

## 2017-11-12 MED ORDER — TIOTROPIUM BROMIDE MONOHYDRATE 18 MCG IN CAPS
18.0000 ug | ORAL_CAPSULE | Freq: Every day | RESPIRATORY_TRACT | Status: DC
  Administered 2017-11-13 – 2017-11-18 (×5): 18 ug via RESPIRATORY_TRACT
  Filled 2017-11-12: qty 5

## 2017-11-12 MED ORDER — CARBAMIDE PEROXIDE 6.5 % OT SOLN
5.0000 [drp] | Freq: Two times a day (BID) | OTIC | Status: DC
  Administered 2017-11-12 – 2017-11-18 (×12): 5 [drp] via OTIC
  Filled 2017-11-12 (×2): qty 15

## 2017-11-12 MED ORDER — LISINOPRIL 10 MG PO TABS
10.0000 mg | ORAL_TABLET | Freq: Every day | ORAL | Status: DC
  Administered 2017-11-13: 10 mg via ORAL
  Filled 2017-11-12: qty 1

## 2017-11-12 MED ORDER — INSULIN ASPART 100 UNIT/ML ~~LOC~~ SOLN
0.0000 [IU] | Freq: Three times a day (TID) | SUBCUTANEOUS | Status: DC
  Administered 2017-11-13: 1 [IU] via SUBCUTANEOUS
  Administered 2017-11-13 – 2017-11-14 (×3): 3 [IU] via SUBCUTANEOUS
  Filled 2017-11-12 (×4): qty 1

## 2017-11-12 MED ORDER — PREDNISONE 20 MG PO TABS
40.0000 mg | ORAL_TABLET | Freq: Every day | ORAL | Status: DC
  Administered 2017-11-13: 40 mg via ORAL
  Filled 2017-11-12: qty 2

## 2017-11-12 MED ORDER — INSULIN ASPART 100 UNIT/ML ~~LOC~~ SOLN
0.0000 [IU] | Freq: Every day | SUBCUTANEOUS | Status: DC
  Administered 2017-11-13: 3 [IU] via SUBCUTANEOUS
  Filled 2017-11-12: qty 1

## 2017-11-12 MED ORDER — CARVEDILOL 25 MG PO TABS
25.0000 mg | ORAL_TABLET | Freq: Two times a day (BID) | ORAL | Status: DC
  Administered 2017-11-12 – 2017-11-18 (×12): 25 mg via ORAL
  Filled 2017-11-12 (×12): qty 1

## 2017-11-12 MED ORDER — CEFTRIAXONE SODIUM 1 G IJ SOLR
1.0000 g | Freq: Once | INTRAMUSCULAR | Status: AC
  Administered 2017-11-12: 1 g via INTRAMUSCULAR
  Filled 2017-11-12: qty 10

## 2017-11-12 MED ORDER — ASPIRIN EC 81 MG PO TBEC
81.0000 mg | DELAYED_RELEASE_TABLET | Freq: Every day | ORAL | Status: DC
  Administered 2017-11-13 – 2017-11-18 (×6): 81 mg via ORAL
  Filled 2017-11-12 (×6): qty 1

## 2017-11-12 MED ORDER — INSULIN GLARGINE 100 UNIT/ML ~~LOC~~ SOLN
30.0000 [IU] | Freq: Every day | SUBCUTANEOUS | Status: DC
  Administered 2017-11-12 – 2017-11-14 (×3): 30 [IU] via SUBCUTANEOUS
  Filled 2017-11-12 (×4): qty 0.3

## 2017-11-12 MED ORDER — ENOXAPARIN SODIUM 40 MG/0.4ML ~~LOC~~ SOLN
40.0000 mg | SUBCUTANEOUS | Status: DC
  Administered 2017-11-12 – 2017-11-17 (×6): 40 mg via SUBCUTANEOUS
  Filled 2017-11-12 (×6): qty 0.4

## 2017-11-12 MED ORDER — BUDESONIDE 0.5 MG/2ML IN SUSP
0.5000 mg | Freq: Two times a day (BID) | RESPIRATORY_TRACT | Status: DC
  Administered 2017-11-12 – 2017-11-18 (×11): 0.5 mg via RESPIRATORY_TRACT
  Filled 2017-11-12 (×12): qty 2

## 2017-11-12 MED ORDER — CYCLOBENZAPRINE HCL 10 MG PO TABS: 5.0000 mg | ORAL_TABLET | Freq: Every day | ORAL | Status: DC | PRN

## 2017-11-12 MED ORDER — IPRATROPIUM-ALBUTEROL 0.5-2.5 (3) MG/3ML IN SOLN
3.0000 mL | Freq: Four times a day (QID) | RESPIRATORY_TRACT | Status: DC
  Administered 2017-11-12 – 2017-11-17 (×20): 3 mL via RESPIRATORY_TRACT
  Filled 2017-11-12 (×20): qty 3

## 2017-11-12 MED ORDER — ACETAMINOPHEN 325 MG PO TABS: 650.0000 mg | ORAL_TABLET | Freq: Four times a day (QID) | ORAL | Status: DC | PRN

## 2017-11-12 MED ORDER — LEVOFLOXACIN 250 MG PO TABS
250.0000 mg | ORAL_TABLET | Freq: Every day | ORAL | Status: DC
  Administered 2017-11-13 – 2017-11-18 (×6): 250 mg via ORAL
  Filled 2017-11-12 (×6): qty 1

## 2017-11-12 MED ORDER — IPRATROPIUM-ALBUTEROL 0.5-2.5 (3) MG/3ML IN SOLN
3.0000 mL | Freq: Once | RESPIRATORY_TRACT | Status: AC
  Administered 2017-11-12: 3 mL via RESPIRATORY_TRACT
  Filled 2017-11-12: qty 3

## 2017-11-12 MED ORDER — IPRATROPIUM-ALBUTEROL 0.5-2.5 (3) MG/3ML IN SOLN: 3.0000 mL | Freq: Once | RESPIRATORY_TRACT | Status: DC

## 2017-11-12 MED ORDER — LEVOFLOXACIN 500 MG PO TABS
500.0000 mg | ORAL_TABLET | Freq: Once | ORAL | Status: DC
  Filled 2017-11-12: qty 1

## 2017-11-12 MED ORDER — ATORVASTATIN CALCIUM 20 MG PO TABS
40.0000 mg | ORAL_TABLET | Freq: Every day | ORAL | Status: DC
  Administered 2017-11-12 – 2017-11-17 (×6): 40 mg via ORAL
  Filled 2017-11-12 (×6): qty 2

## 2017-11-12 MED ORDER — METHYLPREDNISOLONE SODIUM SUCC 125 MG IJ SOLR: 125.0000 mg | Freq: Once | INTRAMUSCULAR | Status: DC

## 2017-11-12 MED ORDER — OXYCODONE HCL 5 MG PO TABS: 5.0000 mg | ORAL_TABLET | ORAL | Status: DC | PRN

## 2017-11-12 MED ORDER — AMLODIPINE BESYLATE 5 MG PO TABS
10.0000 mg | ORAL_TABLET | Freq: Every day | ORAL | Status: DC
  Administered 2017-11-13 – 2017-11-18 (×6): 10 mg via ORAL
  Filled 2017-11-12 (×6): qty 2

## 2017-11-12 MED ORDER — METHIMAZOLE 5 MG PO TABS
5.0000 mg | ORAL_TABLET | Freq: Every day | ORAL | Status: DC
  Administered 2017-11-13 – 2017-11-18 (×6): 5 mg via ORAL
  Filled 2017-11-12 (×6): qty 1

## 2017-11-12 NOTE — ED Notes (Signed)
Pt stuck multiple times for IV attempt. Unsuccessful at this time. PA notified and IV team consult ordered.

## 2017-11-12 NOTE — ED Notes (Signed)
2L applied. O2 improved to 89%. Jackie at bedside. Will continue to monitor.

## 2017-11-12 NOTE — Progress Notes (Signed)
PHARMACY NOTE:  ANTIMICROBIAL RENAL DOSAGE ADJUSTMENT  Current antimicrobial regimen includes a mismatch between antimicrobial dosage and estimated renal function.  As per policy approved by the Pharmacy & Therapeutics and Medical Executive Committees, the antimicrobial dosage will be adjusted accordingly.  Current antimicrobial dosage:  Levaquin 500 mg PO daily   Indication:  Renal Function:  Estimated Creatinine Clearance: 44.7 mL/min (A) (by C-G formula based on SCr of 1.6 mg/dL (H)). []      On intermittent HD, scheduled: []      On CRRT    Antimicrobial dosage has been changed to:  Levaquin 500 mg PO X 1 for 11/8 followed by levaquin 250 mg PO daily to start on 11/9 .   Additional comments:   Thank you for allowing pharmacy to be a part of this patient's care.  Orene Desanctis, [facility] 11/12/2017 6:37 PM

## 2017-11-12 NOTE — ED Provider Notes (Signed)
[facility] Emergency Department Provider Note  ____________________________________________  Time seen: Approximately 4:58 PM  I have reviewed the triage vital signs and the nursing notes.   HISTORY  Chief Complaint Cough    HPI Ceirra Belli is a 57 y.o. female with a history of hypertension, chronic kidney disease, asthma, COPD and presents to the emergency department with nonproductive cough for 1 week.  Patient reports that she has felt excessively fatigued and has been sleeping more than usual. Patient has had low grade fever at home.  She has had some right-sided chest wall discomfort with deep inspiration.  She denies recent travel, prolonged immobilization, recent surgery or prior history of DVT or PE.  Patient is not on supplemental oxygen at home.  She was admitted in January 2018 for hypoxia.  Patient also reports that she had community-acquired pneumonia "several years ago".  She denies chest pain, nausea, vomiting or abdominal pain.  Patient is accompanied by her mother.   Past Medical History:  Diagnosis Date  . Asthma   . COPD (chronic obstructive pulmonary disease) ([place])   . Diabetes mellitus without complication ([place])   . Hyperlipidemia   . Hypertension   . Stroke [facility])    Was told at recent hospital visit she has stroke in past  . Thyroid disease   . Thyroid nodule 04/30/2016    Patient Active Problem List   Diagnosis Date Noted  . Lower extremity pain, bilateral 12/17/2016  . Thyroid nodule 04/30/2016  . Left sided numbness 04/27/2016  . Muscle spasm of back 11/05/2015  . Hyperthyroidism 03/13/2015  . Goiter diffuse 03/13/2015  . Poorly controlled type 2 diabetes mellitus with circulatory disorder ([place]) 02/07/2015  . Essential hypertension 12/27/2014  . Chronic kidney disease 12/27/2014  . Tobacco abuse 12/27/2014    Past Surgical History:  Procedure Laterality Date  . arm surgery Left     Prior to Admission medications    Medication Sig Start Date End Date Taking? Authorizing Provider  albuterol (PROVENTIL HFA;VENTOLIN HFA) 108 (90 Base) MCG/ACT inhaler Inhale 1-2 puffs into the lungs every 6 (six) hours as needed for wheezing or shortness of breath. 07/16/16   Doles-Johnson, Teah, NP  amLODipine (NORVASC) 10 MG tablet Take 10 mg by mouth daily.    [provider]  aspirin 81 MG tablet Take 81 mg by mouth daily.    [provider]  atorvastatin (LIPITOR) 40 MG tablet TAKE ONE TABLET BY MOUTH EVERY DAY 04/21/16   Tawni Millers, MD  carvedilol (COREG) 25 MG tablet TAKE 1 TABLET BY MOUTH TWICE DAILY 10/14/17   Philemon Kingdom, MD  cyclobenzaprine (FLEXERIL) 5 MG tablet TAKE ONE TABLET BY MOUTH EVERY DAY AS NEEDED FOR MUSCLE SPASMS 04/20/16   Doles-Johnson, Teah, NP  Dulaglutide (TRULICITY) 1.5 HQ/7.5FF SOPN Inject 1.5 mg into the skin once a week. 09/27/17   Philemon Kingdom, MD  empagliflozin (JARDIANCE) 10 MG TABS tablet Take 10 mg by mouth daily. Before b'fast 09/27/17   Philemon Kingdom, MD  glipiZIDE (GLUCOTROL) 10 MG tablet TAKE 1 TABLET BY MOUTH TWICE DAILY BEFORE MEAL(S) 11/04/17   Philemon Kingdom, MD  Insulin Glargine (LANTUS SOLOSTAR) 100 UNIT/ML Solostar Pen Inject under skin 50 units in am and 30 units at night 05/24/17   Philemon Kingdom, MD  lisinopril (PRINIVIL,ZESTRIL) 10 MG tablet Take 1 tablet (10 mg total) by mouth daily. 05/26/16   Doles-Johnson, Teah, NP  methimazole (TAPAZOLE) 5 MG tablet Take 1 tablet (5 mg total) by mouth daily.  05/25/17   Philemon Kingdom, MD  tiotropium (SPIRIVA) 18 MCG inhalation capsule Place 18 mcg into inhaler and inhale daily.    [provider]  Tiotropium Bromide Monohydrate (SPIRIVA RESPIMAT) 1.25 MCG/ACT AERS Inhale 2 puffs into the lungs 2 (two) times daily.    [provider]    Allergies Patient has no known allergies.  Family History  Problem Relation Age of Onset  . Diabetes Mother   . Hypertension Mother   . Diabetes  Father   . Hypertension Father   . Diabetes Sister     Social History Social History   Tobacco Use  . Smoking status: Former Smoker    Packs/day: 1.50    Types: Cigarettes    Last attempt to quit: 01/13/2016    Years since quitting: 1.8  . Smokeless tobacco: Never Used  Substance Use Topics  . Alcohol use: No  . Drug use: No     Review of Systems  Constitutional: Patient has low grade fever.  Eyes: No visual changes. No discharge ENT: No upper respiratory complaints. Cardiovascular: no chest pain. Respiratory: Patient has cough and fatigue.  Gastrointestinal: No abdominal pain.  No nausea, no vomiting.  No diarrhea.  No constipation. Genitourinary: Negative for dysuria. No hematuria Musculoskeletal: Negative for musculoskeletal pain. Skin: Negative for rash, abrasions, lacerations, ecchymosis. Neurological: Negative for headaches, focal weakness or numbness.   ____________________________________________   PHYSICAL EXAM:  VITAL SIGNS: ED Triage Vitals  Enc Vitals Group     BP 11/12/17 1549 (!) 165/95     Pulse Rate 11/12/17 1349 91     Resp 11/12/17 1549 20     Temp 11/12/17 1349 98.9 F (37.2 C)     Temp Source 11/12/17 1349 Oral     SpO2 11/12/17 1349 (S) (!) 65 %     Weight 11/12/17 1356 220 lb (99.8 kg)     Height --      Head Circumference --      Peak Flow --      Pain Score 11/12/17 1355 7     Pain Loc --      Pain Edu? --      Excl. in [place]? --      Constitutional: Alert and oriented. Well appearing and in no acute distress. Eyes: Conjunctivae are normal. PERRL. EOMI. Head: Atraumatic. ENT:      Ears: TMs are pearly.       Nose: No congestion/rhinnorhea.      Mouth/Throat: Mucous membranes are moist.  Neck: No stridor.  No cervical spine tenderness to palpation. Hematological/Lymphatic/Immunilogical: No cervical lymphadenopathy.  Cardiovascular: Normal rate, regular rhythm. Normal S1 and S2.  Good peripheral circulation. Respiratory:  Tachypnea. Patient has mild expiratory wheezing auscultated diffusely bilaterally but has air entry to the bases. Gastrointestinal: Bowel sounds 4 quadrants. Soft and nontender to palpation. No guarding or rigidity. No palpable masses. No distention. No CVA tenderness. Musculoskeletal: Full range of motion to all extremities. No gross deformities appreciated. Neurologic:  Normal speech and language. No gross focal neurologic deficits are appreciated.  Skin:  Skin is warm, dry and intact. No rash noted. Psychiatric: Mood and affect are normal. Speech and behavior are normal. Patient exhibits appropriate insight and judgement.   ____________________________________________   LABS (all labs ordered are listed, but only abnormal results are displayed)  Labs Reviewed  CBC WITH DIFFERENTIAL/PLATELET - Abnormal; Notable for the following components:      Result Value   HCT 50.3 (*)    MCHC  29.8 (*)    nRBC 1.1 (*)    All other components within normal limits  COMPREHENSIVE METABOLIC PANEL - Abnormal; Notable for the following components:   Creatinine, Ser 1.60 (*)    Calcium 8.7 (*)    Albumin 3.2 (*)    GFR calc non Af Amer 34 (*)    GFR calc Af Amer 40 (*)    All other components within normal limits   ____________________________________________  EKG   ____________________________________________  RADIOLOGY I personally viewed and evaluated these images as part of my medical decision making, as well as reviewing the written report by the radiologist.  Dg Chest 2 View  Result Date: 11/12/2017 CLINICAL DATA:  Right breast pain. Also nonproductive cough and fatigue. EXAM: CHEST - 2 VIEW COMPARISON:  01/14/2016 FINDINGS: There is right medial lung base opacity that has increased from the prior exam. Both lungs show thickened bronchovascular markings which are stable from the prior study. No pleural effusion or pneumothorax. Cardiac silhouette is mildly enlarged.  No mediastinal hilar  masses. Skeletal structures intact. IMPRESSION: 1. Right medial lung base opacity that appears increased from the prior study. This may be due to atelectasis or pneumonia. 2. Cardiomegaly and prominent bronchovascular markings, stable from the prior study. No overt pulmonary edema. Electronically Signed   By: Lajean Manes M.D.   On: 11/12/2017 14:35    ____________________________________________    PROCEDURES  Procedure(s) performed:    Procedures    Medications  methylPREDNISolone sodium succinate (SOLU-MEDROL) 125 mg/2 mL injection 125 mg (has no administration in time range)  cefTRIAXone (ROCEPHIN) injection 1 g (1 g Intramuscular Given 11/12/17 1539)  ipratropium-albuterol (DUONEB) 0.5-2.5 (3) MG/3ML nebulizer solution 3 mL (3 mLs Nebulization Given 11/12/17 1539)     ____________________________________________   INITIAL IMPRESSION / ASSESSMENT AND PLAN / ED COURSE  Pertinent labs & imaging results that were available during my care of the patient were reviewed by me and considered in my medical decision making (see chart for details).  Review of the [place] CSRS was performed in accordance of the [place] prior to dispensing any controlled drugs.      Assessment and Plan:  Hypoxia Community-acquired pneumonia Patient presents to the emergency department with cough and fatigue for 1 week.  Chest x-ray shows right middle lobe pneumonia.  Patient was given Rocephin, Solu-Medrol and DuoNeb in the emergency department.  SPO2 was concerning in the low 80s on room air.  Patient was satting in the high 80s to low 90s on 4 L.  No leukocytosis on CBC.  CMP was consistent with labs obtained in the past.  Prime doc was consulted given hypoxia and patient was accepted for admission.  Patient's case was reviewed with attending, Dr. Clearnce Hasten.    ____________________________________________  FINAL CLINICAL IMPRESSION(S) / ED DIAGNOSES  Final diagnoses:  None      NEW MEDICATIONS  STARTED DURING THIS VISIT:  ED Discharge Orders    None          This chart was dictated using voice recognition software/Dragon. Despite best efforts to proofread, errors can occur which can change the meaning. Any change was purely unintentional.    Lannie Fields, PA-C 11/12/17 1733    Orbie Pyo, MD 11/13/17 [phone]

## 2017-11-12 NOTE — ED Triage Notes (Signed)
Pt stating she has been having right "breast" pain. Pt stating cough that is non-productive. Pt stating fatigue. Pt's skin WNL. Wet cough

## 2017-11-12 NOTE — H&P (Signed)
[facility] NAME: Emily [name]    MR#:  [id number]  DATE OF BIRTH:  [birth year]  DATE OF ADMISSION:  11/12/2017  PRIMARY CARE PHYSICIAN: Gennette Pac, FNP   REQUESTING/REFERRING PHYSICIAN: Dr Larae Grooms  CHIEF COMPLAINT:   Chief Complaint  Patient presents with  . Cough    HISTORY OF PRESENT ILLNESS:  Emily [name]  is a 57 y.o. female came in with difficulty breathing and thought she had pneumonia.  She has been very weak and tired and does not want to do anything.  She is been having a dry cough.  She has been having some pain in her right side of her chest on the side.  In the ER she was found to be hypoxic and placed on oxygen.  Chest x-ray showing a right lower lobe pneumonia.  They are having difficulty with an IV in the emergency room.  Hospitalist services contacted for further evaluation.  PAST MEDICAL HISTORY:   Past Medical History:  Diagnosis Date  . Asthma   . COPD (chronic obstructive pulmonary disease) ([place])   . Diabetes mellitus without complication ([place])   . Hyperlipidemia   . Hypertension   . Stroke [facility])    Was told at recent hospital visit she has stroke in past  . Thyroid disease   . Thyroid nodule 04/30/2016    PAST SURGICAL HISTORY:   Past Surgical History:  Procedure Laterality Date  . arm surgery Left     SOCIAL HISTORY:   Social History   Tobacco Use  . Smoking status: Former Smoker    Packs/day: 1.50    Types: Cigarettes    Last attempt to quit: 01/13/2016    Years since quitting: 1.8  . Smokeless tobacco: Never Used  Substance Use Topics  . Alcohol use: No    FAMILY HISTORY:   Family History  Problem Relation Age of Onset  . Diabetes Mother   . Hypertension Mother   . Diabetes Father   . Hypertension Father   . CAD Father   . Diabetes Sister     DRUG ALLERGIES:  No Known Allergies  REVIEW OF SYSTEMS:  CONSTITUTIONAL: No fever, positive for  cold feeling.  Positive for fatigue.  Positive for weight gain. EYES: No blurred or double vision.  EARS, NOSE, AND THROAT: No tinnitus or ear pain. No sore throat RESPIRATORY: Positive for cough. positive for shortness of breath.  Positive for wheezing.  No hemoptysis.  CARDIOVASCULAR: No chest pain, orthopnea, edema.  Right rib pain GASTROINTESTINAL: No nausea, vomiting, diarrhea or abdominal pain. No blood in bowel movements GENITOURINARY: No dysuria, hematuria.  ENDOCRINE: No polyuria, nocturia,  HEMATOLOGY: No anemia, easy bruising or bleeding SKIN: No rash or lesion. MUSCULOSKELETAL: No joint pain or arthritis.   NEUROLOGIC: No tingling, numbness, weakness.  PSYCHIATRY: No anxiety or depression.   MEDICATIONS AT HOME:   Prior to Admission medications   Medication Sig Start Date End Date Taking? Authorizing Provider  albuterol (PROVENTIL HFA;VENTOLIN HFA) 108 (90 Base) MCG/ACT inhaler Inhale 1-2 puffs into the lungs every 6 (six) hours as needed for wheezing or shortness of breath. 07/16/16  Yes Doles-Johnson, Teah, NP  amLODipine (NORVASC) 10 MG tablet Take 10 mg by mouth daily.   Yes [provider]  aspirin EC 81 MG tablet Take 81 mg by mouth daily.   Yes [provider]  atorvastatin (LIPITOR) 40 MG tablet TAKE ONE TABLET BY MOUTH EVERY DAY Patient taking  differently: Take 40 mg by mouth at bedtime.  04/21/16  Yes Tawni Millers, MD  carvedilol (COREG) 25 MG tablet TAKE 1 TABLET BY MOUTH TWICE DAILY Patient taking differently: Take 25 mg by mouth 2 (two) times daily.  10/14/17  Yes Philemon Kingdom, MD  cyclobenzaprine (FLEXERIL) 5 MG tablet TAKE ONE TABLET BY MOUTH EVERY DAY AS NEEDED FOR MUSCLE SPASMS Patient taking differently: Take 5 mg by mouth daily as needed for muscle spasms.  04/20/16  Yes Doles-Johnson, Teah, NP  Dulaglutide (TRULICITY) 1.5 LZ/7.6BH SOPN Inject 1.5 mg into the skin once a week. Patient taking differently: Inject 1.5 mg into the skin  every Sunday.  09/27/17  Yes Philemon Kingdom, MD  empagliflozin (JARDIANCE) 10 MG TABS tablet Take 10 mg by mouth daily. Before b'fast Patient taking differently: Take 10 mg by mouth daily before breakfast.  09/27/17  Yes Philemon Kingdom, MD  glipiZIDE (GLUCOTROL) 10 MG tablet TAKE 1 TABLET BY MOUTH TWICE DAILY BEFORE MEAL(S) Patient taking differently: Take 10 mg by mouth 2 (two) times daily before a meal.  11/04/17  Yes Philemon Kingdom, MD  Insulin Glargine (LANTUS SOLOSTAR) 100 UNIT/ML Solostar Pen Inject under skin 50 units in am and 30 units at night Patient taking differently: Inject 30-50 Units into the skin See admin instructions. 50 units every morning and 30 units at bedtime 05/24/17  Yes Philemon Kingdom, MD  lisinopril (PRINIVIL,ZESTRIL) 10 MG tablet Take 1 tablet (10 mg total) by mouth daily. 05/26/16  Yes Doles-Johnson, Teah, NP  methimazole (TAPAZOLE) 5 MG tablet Take 1 tablet (5 mg total) by mouth daily. 05/25/17  Yes Philemon Kingdom, MD  tiotropium (SPIRIVA) 18 MCG inhalation capsule Place 18 mcg into inhaler and inhale daily.   Yes [provider]      VITAL SIGNS:  Blood pressure (!) 165/95, pulse 94, temperature 98.9 F (37.2 C), temperature source Oral, resp. rate 20, weight 99.8 kg, SpO2 (!) 86 %.  PHYSICAL EXAMINATION:  GENERAL:  57 y.o.-year-old patient lying in the bed with no acute distress.  EYES: Pupils equal, round, reactive to light and accommodation. No scleral icterus. Extraocular muscles intact.  HEENT: Head atraumatic, normocephalic. Oropharynx and nasopharynx clear.  NECK:  Supple, no jugular venous distention. No thyroid enlargement, no tenderness.  LUNGS: Decreased breath sounds bilaterally, positive expiratory wheezing.no rales,rhonchi or crepitation. No use of accessory muscles of respiration.  CARDIOVASCULAR: S1, S2 normal. No murmurs, rubs, or gallops.  ABDOMEN: Soft, nontender, nondistended. Bowel sounds present. No organomegaly or mass.   EXTREMITIES: No pedal edema, cyanosis, or clubbing.  NEUROLOGIC: Cranial nerves II through XII are intact. Muscle strength 5/5 in all extremities. Sensation intact. Gait not checked.  PSYCHIATRIC: The patient is alert and oriented x 3.  SKIN: No rash, lesion, or ulcer.   LABORATORY PANEL:   CBC Recent Labs  Lab 11/12/17 1630  WBC 8.3  HGB 15.0  HCT 50.3*  PLT 183   ------------------------------------------------------------------------------------------------------------------  Chemistries  Recent Labs  Lab 11/12/17 1630  NA 143  K 4.1  CL 107  CO2 29  GLUCOSE 90  BUN 17  CREATININE 1.60*  CALCIUM 8.7*  AST 18  ALT 17  ALKPHOS 98  BILITOT 0.8   ------------------------------------------------------------------------------------------------------------------    RADIOLOGY:  Dg Chest 2 View  Result Date: 11/12/2017 CLINICAL DATA:  Right breast pain. Also nonproductive cough and fatigue. EXAM: CHEST - 2 VIEW COMPARISON:  01/14/2016 FINDINGS: There is right medial lung base opacity that has increased from the prior exam. Both  lungs show thickened bronchovascular markings which are stable from the prior study. No pleural effusion or pneumothorax. Cardiac silhouette is mildly enlarged.  No mediastinal hilar masses. Skeletal structures intact. IMPRESSION: 1. Right medial lung base opacity that appears increased from the prior study. This may be due to atelectasis or pneumonia. 2. Cardiomegaly and prominent bronchovascular markings, stable from the prior study. No overt pulmonary edema. Electronically Signed   By: Lajean Manes M.D.   On: 11/12/2017 14:35    EKG:   Normal sinus rhythm 87 bpm sinus arrhythmia  IMPRESSION AND PLAN:   1.  Acute hypoxic respiratory failure.  Pulse ox 86% on room air.  Oxygen supplementation ordered. 2.  Right lower lobe pneumonia.  With trouble with IV they gave IM Rocephin in the emergency room.  I will switch over the Levaquin. 3.  COPD  exacerbation start on prednisone.  Start on DuoNeb nebulizer solution and budesonide nebulizers. 4.  Chronic kidney disease stage III.  5.  Type 2 diabetes mellitus put on Lantus and sliding scale.  Last hemoglobin A1c 8.6 6.  Hyperthyroidism.  On methimazole 7.  Obesity.  Weight loss needed    All the records are reviewed and case discussed with ED provider. Management plans discussed with the patient, and she is in agreement.  CODE STATUS: full code  TOTAL TIME TAKING CARE OF THIS PATIENT: 50 minutes.    Loletha Grayer M.D on 11/12/2017 at 6:05 PM  Between 7am to 6pm - Pager - [phone]  After 6pm call admission pager [phone]  [facility]  [phone]  CC: Primary care physician; Gennette Pac, [place]

## 2017-11-13 LAB — RESPIRATORY PANEL BY PCR
Adenovirus: NOT DETECTED
BORDETELLA PERTUSSIS-RVPCR: NOT DETECTED
CHLAMYDOPHILA PNEUMONIAE-RVPPCR: NOT DETECTED
CORONAVIRUS HKU1-RVPPCR: NOT DETECTED
Coronavirus 229E: NOT DETECTED
Coronavirus NL63: NOT DETECTED
Coronavirus OC43: NOT DETECTED
Influenza A: NOT DETECTED
Influenza B: NOT DETECTED
METAPNEUMOVIRUS-RVPPCR: NOT DETECTED
Mycoplasma pneumoniae: NOT DETECTED
PARAINFLUENZA VIRUS 3-RVPPCR: NOT DETECTED
Parainfluenza Virus 1: NOT DETECTED
Parainfluenza Virus 2: NOT DETECTED
Parainfluenza Virus 4: NOT DETECTED
RHINOVIRUS / ENTEROVIRUS - RVPPCR: NOT DETECTED
Respiratory Syncytial Virus: NOT DETECTED

## 2017-11-13 LAB — GLUCOSE, CAPILLARY
GLUCOSE-CAPILLARY: 133 mg/dL — AB (ref 70–99)
GLUCOSE-CAPILLARY: 222 mg/dL — AB (ref 70–99)
GLUCOSE-CAPILLARY: 282 mg/dL — AB (ref 70–99)
Glucose-Capillary: 102 mg/dL — ABNORMAL HIGH (ref 70–99)

## 2017-11-13 LAB — BASIC METABOLIC PANEL
Anion gap: 5 (ref 5–15)
BUN: 19 mg/dL (ref 6–20)
CALCIUM: 8.2 mg/dL — AB (ref 8.9–10.3)
CHLORIDE: 109 mmol/L (ref 98–111)
CO2: 31 mmol/L (ref 22–32)
CREATININE: 1.83 mg/dL — AB (ref 0.44–1.00)
GFR calc non Af Amer: 29 mL/min — ABNORMAL LOW (ref >60)
GFR, EST AFRICAN AMERICAN: 34 mL/min — AB (ref >60)
Glucose, Bld: 125 mg/dL — ABNORMAL HIGH (ref 70–99)
Potassium: 4 mmol/L (ref 3.5–5.1)
SODIUM: 145 mmol/L (ref 135–145)

## 2017-11-13 LAB — CBC
HCT: 45.9 % (ref 36.0–46.0)
Hemoglobin: 13.5 g/dL (ref 12.0–15.0)
MCH: 29.8 pg (ref 26.0–34.0)
MCHC: 29.4 g/dL — AB (ref 30.0–36.0)
MCV: 101.3 fL — AB (ref 80.0–100.0)
PLATELETS: 181 10*3/uL (ref 150–400)
RBC: 4.53 MIL/uL (ref 3.87–5.11)
RDW: 14.6 % (ref 11.5–15.5)
WBC: 8.5 10*3/uL (ref 4.0–10.5)
nRBC: 1.2 % — ABNORMAL HIGH (ref 0.0–0.2)

## 2017-11-13 MED ORDER — METHYLPREDNISOLONE SODIUM SUCC 40 MG IJ SOLR
40.0000 mg | Freq: Two times a day (BID) | INTRAMUSCULAR | Status: DC
  Administered 2017-11-13 – 2017-11-17 (×8): 40 mg via INTRAVENOUS
  Filled 2017-11-13 (×9): qty 1

## 2017-11-13 MED ORDER — SODIUM CHLORIDE 0.9 % IV SOLN
INTRAVENOUS | Status: DC
  Administered 2017-11-13 – 2017-11-18 (×6): via INTRAVENOUS

## 2017-11-13 NOTE — Progress Notes (Signed)
Attempted ambulation in hall with portable oxygen. Patient's O2 SAT dropped to 70 and 80's when attempting to stand and walk. Will continue to monitor.

## 2017-11-13 NOTE — Progress Notes (Signed)
Patient ID: Emily [name], female   DOB: [birth year], 57 y.o.   MRN: [id number]  [employer] [facility] PROGRESS NOTE  Carmesha Morocco NOM:[id number] DOB: Aug 20, 1960 DOA: 11/12/2017 PCP: Gennette Pac, FNP  HPI/Subjective: Patient feels okay.  States her breathing is a little bit better.  Her cough is a little bit less and feeling okay.  They had to go up on her oxygen requirements.  Objective: Vitals:   11/13/17 0940 11/13/17 1201  BP:  139/84  Pulse: 78 81  Resp:  20  Temp:  98.7 F (37.1 C)  SpO2: 92% 92%    Intake/Output Summary (Last 24 hours) at 11/13/2017 1436 Last data filed at 11/13/2017 1415 Gross per 24 hour  Intake 480 ml  Output 100 ml  Net 380 ml   Filed Weights   11/12/17 1356  Weight: 99.8 kg    ROS: Review of Systems  Constitutional: Negative for chills and fever.  Eyes: Negative for blurred vision.  Respiratory: Positive for cough and shortness of breath.   Cardiovascular: Negative for chest pain.  Gastrointestinal: Negative for abdominal pain, constipation, diarrhea, nausea and vomiting.  Genitourinary: Negative for dysuria.  Musculoskeletal: Negative for joint pain.  Neurological: Negative for dizziness and headaches.   Exam: Physical Exam  Constitutional: She is oriented to person, place, and time.  HENT:  Nose: No mucosal edema.  Mouth/Throat: No oropharyngeal exudate or posterior oropharyngeal edema.  Eyes: Pupils are equal, round, and reactive to light. Conjunctivae, EOM and lids are normal.  Neck: No JVD present. Carotid bruit is not present. No edema present. Thyromegaly present. No thyroid mass present.  Cardiovascular: S1 normal and S2 normal. Exam reveals no gallop.  No murmur heard. Pulses:      Dorsalis pedis pulses are 2+ on the right side, and 2+ on the left side.  Respiratory: No respiratory distress. She has decreased breath sounds in the right middle field, the right lower field, the left middle field and the left lower field.  She has wheezes in the right middle field and the left middle field. She has no rhonchi. She has no rales.  GI: Soft. Bowel sounds are normal. There is no tenderness.  Musculoskeletal:       Right ankle: She exhibits no swelling.       Left ankle: She exhibits no swelling.  Lymphadenopathy:    She has no cervical adenopathy.  Neurological: She is alert and oriented to person, place, and time. No cranial nerve deficit.  Skin: Skin is warm. No rash noted. Nails show no clubbing.  Psychiatric: She has a normal mood and affect.      Data Reviewed: Basic Metabolic Panel: Recent Labs  Lab 11/12/17 1630 11/13/17 0446  NA 143 145  K 4.1 4.0  CL 107 109  CO2 29 31  GLUCOSE 90 125*  BUN 17 19  CREATININE 1.60* 1.83*  CALCIUM 8.7* 8.2*   Liver Function Tests: Recent Labs  Lab 11/12/17 1630  AST 18  ALT 17  ALKPHOS 98  BILITOT 0.8  PROT 7.2  ALBUMIN 3.2*   CBC: Recent Labs  Lab 11/12/17 1630 11/13/17 0446  WBC 8.3 8.5  NEUTROABS 4.8  --   HGB 15.0 13.5  HCT 50.3* 45.9  MCV 98.8 101.3*  PLT 183 181    CBG: Recent Labs  Lab 11/12/17 1854 11/12/17 1932 11/12/17 2149 11/13/17 0814 11/13/17 1159  GLUCAP 65* 91 197* 102* 133*    Recent Results (from the past 240 hour(s))  Respiratory Panel by  PCR     Status: None   Collection Time: 11/12/17  7:03 PM  Result Value Ref Range Status   Adenovirus NOT DETECTED NOT DETECTED Final   Coronavirus 229E NOT DETECTED NOT DETECTED Final   Coronavirus HKU1 NOT DETECTED NOT DETECTED Final   Coronavirus NL63 NOT DETECTED NOT DETECTED Final   Coronavirus OC43 NOT DETECTED NOT DETECTED Final   Metapneumovirus NOT DETECTED NOT DETECTED Final   Rhinovirus / Enterovirus NOT DETECTED NOT DETECTED Final   Influenza A NOT DETECTED NOT DETECTED Final   Influenza B NOT DETECTED NOT DETECTED Final   Parainfluenza Virus 1 NOT DETECTED NOT DETECTED Final   Parainfluenza Virus 2 NOT DETECTED NOT DETECTED Final   Parainfluenza Virus 3  NOT DETECTED NOT DETECTED Final   Parainfluenza Virus 4 NOT DETECTED NOT DETECTED Final   Respiratory Syncytial Virus NOT DETECTED NOT DETECTED Final   Bordetella pertussis NOT DETECTED NOT DETECTED Final   Chlamydophila pneumoniae NOT DETECTED NOT DETECTED Final   Mycoplasma pneumoniae NOT DETECTED NOT DETECTED Final    Comment: Performed at [facility], [place] [address], [place], [place] [address]     Studies: Dg Chest 2 View  Result Date: 11/12/2017 CLINICAL DATA:  Right breast pain. Also nonproductive cough and fatigue. EXAM: CHEST - 2 VIEW COMPARISON:  01/14/2016 FINDINGS: There is right medial lung base opacity that has increased from the prior exam. Both lungs show thickened bronchovascular markings which are stable from the prior study. No pleural effusion or pneumothorax. Cardiac silhouette is mildly enlarged.  No mediastinal hilar masses. Skeletal structures intact. IMPRESSION: 1. Right medial lung base opacity that appears increased from the prior study. This may be due to atelectasis or pneumonia. 2. Cardiomegaly and prominent bronchovascular markings, stable from the prior study. No overt pulmonary edema. Electronically Signed   By: Lajean Manes M.D.   On: 11/12/2017 14:35    Scheduled Meds: . amLODipine  10 mg Oral Daily  . aspirin EC  81 mg Oral Daily  . atorvastatin  40 mg Oral QHS  . budesonide (PULMICORT) nebulizer solution  0.5 mg Nebulization BID  . carbamide peroxide  5 drop Both EARS BID  . carvedilol  25 mg Oral BID  . enoxaparin (LOVENOX) injection  40 mg Subcutaneous Q24H  . insulin aspart  0-5 Units Subcutaneous QHS  . insulin aspart  0-9 Units Subcutaneous TID WC  . insulin glargine  30 Units Subcutaneous QHS  . insulin glargine  50 Units Subcutaneous q morning - 10a  . ipratropium-albuterol  3 mL Nebulization Q6H  . levofloxacin  250 mg Oral Daily  . levofloxacin  500 mg Oral Once  . lisinopril  10 mg Oral Daily  . methimazole  5 mg Oral Daily  .  methylPREDNISolone (SOLU-MEDROL) injection  40 mg Intravenous Q12H  . tiotropium  18 mcg Inhalation Daily   Continuous Infusions:  Assessment/Plan:   1. Acute hypoxic respiratory failure.  Pulse ox 86% on room air.  Supplemental oxygen ordered.  Try to keep pulse ox around 88% if possible. 2. Right lower lobe pneumonia.  Levaquin orally. 3. COPD exacerbation, pleuritic chest pain.  Switch prednisone back to Solu-Medrol since the patient does have an IV.  Continue DuoNeb nebulizer solution and budesonide nebulizers. 4. Chronic kidney disease stage III.  Monitor with IV fluids 5. Goiter and hyperthyroidism on methimazole check a TSH. 6. Type 2 diabetes mellitus.  On large dose of Lantus twice a day. 7. Obesity.  Weight loss  needed.  BMI 36.89 8. Nurse to ambulate in the hallway with portable tank  Code Status:     Code Status Orders  (From admission, onward)         Start     Ordered   11/12/17 1802  Full code  Continuous     11/12/17 1802        Code Status History    Date Active Date Inactive Code Status Order ID Comments User Context   04/27/2016 2025 04/28/2016 1954 Full Code [id number]  Demetrios Loll, MD Inpatient   01/15/2016 0358 01/16/2016 1752 Full Code [id number]  Hugelmeyer, Ubaldo Glassing, DO Inpatient     Family Communication: Mother at the bedside Disposition Plan: Hopefully can get off oxygen and disposition soon.  Antibiotics: -Levaquin  Time spent: 27 minutes  [place]

## 2017-11-13 NOTE — Progress Notes (Signed)
Ambulated in the halls with portable O2 on 4L. O2 SAT dropped to 82%. Back up to 92% on 4L back in bed.

## 2017-11-14 LAB — TSH: TSH: 0.757 u[IU]/mL (ref 0.350–4.500)

## 2017-11-14 LAB — BASIC METABOLIC PANEL
ANION GAP: 10 (ref 5–15)
BUN: 32 mg/dL — ABNORMAL HIGH (ref 6–20)
CO2: 29 mmol/L (ref 22–32)
CREATININE: 2.11 mg/dL — AB (ref 0.44–1.00)
Calcium: 8.2 mg/dL — ABNORMAL LOW (ref 8.9–10.3)
Chloride: 102 mmol/L (ref 98–111)
GFR calc Af Amer: 29 mL/min — ABNORMAL LOW (ref >60)
GFR calc non Af Amer: 25 mL/min — ABNORMAL LOW (ref >60)
Glucose, Bld: 280 mg/dL — ABNORMAL HIGH (ref 70–99)
Potassium: 4.6 mmol/L (ref 3.5–5.1)
SODIUM: 141 mmol/L (ref 135–145)

## 2017-11-14 LAB — GLUCOSE, CAPILLARY
GLUCOSE-CAPILLARY: 243 mg/dL — AB (ref 70–99)
Glucose-Capillary: 220 mg/dL — ABNORMAL HIGH (ref 70–99)
Glucose-Capillary: 356 mg/dL — ABNORMAL HIGH (ref 70–99)
Glucose-Capillary: 342 mg/dL — ABNORMAL HIGH (ref 70–99)

## 2017-11-14 LAB — EXPECTORATED SPUTUM ASSESSMENT W REFEX TO RESP CULTURE

## 2017-11-14 LAB — EXPECTORATED SPUTUM ASSESSMENT W GRAM STAIN, RFLX TO RESP C: Special Requests: NORMAL

## 2017-11-14 MED ORDER — SODIUM CHLORIDE 0.9% FLUSH: 10.0000 mL | INTRAVENOUS | Status: DC | PRN

## 2017-11-14 MED ORDER — INSULIN ASPART 100 UNIT/ML ~~LOC~~ SOLN
3.0000 [IU] | Freq: Three times a day (TID) | SUBCUTANEOUS | Status: DC
  Administered 2017-11-14 – 2017-11-15 (×3): 3 [IU] via SUBCUTANEOUS
  Filled 2017-11-14 (×3): qty 1

## 2017-11-14 MED ORDER — INSULIN ASPART 100 UNIT/ML ~~LOC~~ SOLN
0.0000 [IU] | Freq: Every day | SUBCUTANEOUS | Status: DC
  Administered 2017-11-14 – 2017-11-15 (×2): 4 [IU] via SUBCUTANEOUS
  Administered 2017-11-16 – 2017-11-17 (×2): 5 [IU] via SUBCUTANEOUS
  Filled 2017-11-14 (×4): qty 1

## 2017-11-14 MED ORDER — INSULIN ASPART 100 UNIT/ML ~~LOC~~ SOLN
0.0000 [IU] | Freq: Three times a day (TID) | SUBCUTANEOUS | Status: DC
  Administered 2017-11-14: 15 [IU] via SUBCUTANEOUS
  Administered 2017-11-15: 5 [IU] via SUBCUTANEOUS
  Administered 2017-11-15 (×2): 11 [IU] via SUBCUTANEOUS
  Administered 2017-11-16: 15 [IU] via SUBCUTANEOUS
  Administered 2017-11-16 (×2): 8 [IU] via SUBCUTANEOUS
  Administered 2017-11-17: 11 [IU] via SUBCUTANEOUS
  Administered 2017-11-17: 8 [IU] via SUBCUTANEOUS
  Administered 2017-11-17: 11 [IU] via SUBCUTANEOUS
  Administered 2017-11-18: 5 [IU] via SUBCUTANEOUS
  Administered 2017-11-18: 3 [IU] via SUBCUTANEOUS
  Filled 2017-11-14 (×12): qty 1

## 2017-11-14 MED ORDER — SODIUM CHLORIDE 0.9% FLUSH
10.0000 mL | Freq: Two times a day (BID) | INTRAVENOUS | Status: DC
  Administered 2017-11-14 – 2017-11-17 (×5): 10 mL

## 2017-11-14 NOTE — Progress Notes (Signed)
   11/14/17 1450  PT Visit Information  Last PT Received On 11/14/17     2:51 PM, 11/14/17 Etta Grandchild, PT, DPT Physical Therapist - [facility]  [phone] [facility])

## 2017-11-14 NOTE — Progress Notes (Signed)
Patient ID: Emily [name], female   DOB: Feb 21, 1960, 57 y.o.   MRN: [id number]  [employer] PROGRESS NOTE  Emily [name] VEH:[id number] DOB: [birth year] DOA: 11/12/2017 PCP: Gennette Pac, FNP  HPI/Subjective: Patient feels okay.  Patient still unable to come off oxygen at this point.  Still has some cough and some shortness of breath.  No problems swallowing.  Objective: Vitals:   11/14/17 0508 11/14/17 0819  BP: 132/87 136/88  Pulse: 83   Resp: (!) 24   Temp: (!) 97.5 F (36.4 C)   SpO2: 93%     Filed Weights   11/12/17 1356  Weight: 99.8 kg    ROS: Review of Systems  Constitutional: Negative for chills and fever.  Eyes: Negative for blurred vision.  Respiratory: Positive for cough and shortness of breath.   Cardiovascular: Negative for chest pain.  Gastrointestinal: Negative for abdominal pain, constipation, diarrhea, nausea and vomiting.  Genitourinary: Negative for dysuria.  Musculoskeletal: Negative for joint pain.  Neurological: Negative for dizziness and headaches.   Exam: Physical Exam  Constitutional: She is oriented to person, place, and time.  HENT:  Nose: No mucosal edema.  Mouth/Throat: No oropharyngeal exudate or posterior oropharyngeal edema.  Eyes: Pupils are equal, round, and reactive to light. Conjunctivae, EOM and lids are normal.  Neck: No JVD present. Carotid bruit is not present. No edema present. Thyromegaly present. No thyroid mass present.  Cardiovascular: S1 normal and S2 normal. Exam reveals no gallop.  No murmur heard. Pulses:      Dorsalis pedis pulses are 2+ on the right side, and 2+ on the left side.  Respiratory: No respiratory distress. She has decreased breath sounds in the right lower field and the left lower field. She has wheezes in the right lower field and the left lower field. She has no rhonchi. She has no rales.  GI: Soft. Bowel sounds are normal. There is no tenderness.  Musculoskeletal:       Right ankle: She  exhibits no swelling.       Left ankle: She exhibits no swelling.  Lymphadenopathy:    She has no cervical adenopathy.  Neurological: She is alert and oriented to person, place, and time. No cranial nerve deficit.  Skin: Skin is warm. No rash noted. Nails show no clubbing.  Psychiatric: She has a normal mood and affect.      Data Reviewed: Basic Metabolic Panel: Recent Labs  Lab 11/12/17 1630 11/13/17 0446 11/14/17 0530  NA 143 145 141  K 4.1 4.0 4.6  CL 107 109 102  CO2 29 31 29   GLUCOSE 90 125* 280*  BUN 17 19 32*  CREATININE 1.60* 1.83* 2.11*  CALCIUM 8.7* 8.2* 8.2*   Liver Function Tests: Recent Labs  Lab 11/12/17 1630  AST 18  ALT 17  ALKPHOS 98  BILITOT 0.8  PROT 7.2  ALBUMIN 3.2*   CBC: Recent Labs  Lab 11/12/17 1630 11/13/17 0446  WBC 8.3 8.5  NEUTROABS 4.8  --   HGB 15.0 13.5  HCT 50.3* 45.9  MCV 98.8 101.3*  PLT 183 181    CBG: Recent Labs  Lab 11/13/17 1159 11/13/17 1623 11/13/17 2134 11/14/17 0740 11/14/17 1201  GLUCAP 133* 222* 282* 220* 243*    Recent Results (from the past 240 hour(s))  Respiratory Panel by PCR     Status: None   Collection Time: 11/12/17  7:03 PM  Result Value Ref Range Status   Adenovirus NOT DETECTED NOT DETECTED Final   Coronavirus 229E  NOT DETECTED NOT DETECTED Final   Coronavirus HKU1 NOT DETECTED NOT DETECTED Final   Coronavirus NL63 NOT DETECTED NOT DETECTED Final   Coronavirus OC43 NOT DETECTED NOT DETECTED Final   Metapneumovirus NOT DETECTED NOT DETECTED Final   Rhinovirus / Enterovirus NOT DETECTED NOT DETECTED Final   Influenza A NOT DETECTED NOT DETECTED Final   Influenza B NOT DETECTED NOT DETECTED Final   Parainfluenza Virus 1 NOT DETECTED NOT DETECTED Final   Parainfluenza Virus 2 NOT DETECTED NOT DETECTED Final   Parainfluenza Virus 3 NOT DETECTED NOT DETECTED Final   Parainfluenza Virus 4 NOT DETECTED NOT DETECTED Final   Respiratory Syncytial Virus NOT DETECTED NOT DETECTED Final    Bordetella pertussis NOT DETECTED NOT DETECTED Final   Chlamydophila pneumoniae NOT DETECTED NOT DETECTED Final   Mycoplasma pneumoniae NOT DETECTED NOT DETECTED Final    Comment: Performed at [facility], [place] [address], [place], [place] [address]  Expectorated sputum assessment w rflx to resp cult     Status: None   Collection Time: 11/14/17  8:24 AM  Result Value Ref Range Status   Specimen Description SPUTUM  Final   Special Requests Normal  Final   Sputum evaluation   Final    THIS SPECIMEN IS ACCEPTABLE FOR SPUTUM CULTURE Performed at [facility], [address] [place], [place] [address]    Report Status 11/14/2017 FINAL  Final     Studies: Dg Chest 2 View  Result Date: 11/12/2017 CLINICAL DATA:  Right breast pain. Also nonproductive cough and fatigue. EXAM: CHEST - 2 VIEW COMPARISON:  01/14/2016 FINDINGS: There is right medial lung base opacity that has increased from the prior exam. Both lungs show thickened bronchovascular markings which are stable from the prior study. No pleural effusion or pneumothorax. Cardiac silhouette is mildly enlarged.  No mediastinal hilar masses. Skeletal structures intact. IMPRESSION: 1. Right medial lung base opacity that appears increased from the prior study. This may be due to atelectasis or pneumonia. 2. Cardiomegaly and prominent bronchovascular markings, stable from the prior study. No overt pulmonary edema. Electronically Signed   By: Lajean Manes M.D.   On: 11/12/2017 14:35    Scheduled Meds: . amLODipine  10 mg Oral Daily  . aspirin EC  81 mg Oral Daily  . atorvastatin  40 mg Oral QHS  . budesonide (PULMICORT) nebulizer solution  0.5 mg Nebulization BID  . carbamide peroxide  5 drop Both EARS BID  . carvedilol  25 mg Oral BID  . enoxaparin (LOVENOX) injection  40 mg Subcutaneous Q24H  . insulin aspart  0-5 Units Subcutaneous QHS  . insulin aspart  0-9 Units Subcutaneous TID WC  . insulin glargine  30 Units  Subcutaneous QHS  . insulin glargine  50 Units Subcutaneous q morning - 10a  . ipratropium-albuterol  3 mL Nebulization Q6H  . levofloxacin  250 mg Oral Daily  . levofloxacin  500 mg Oral Once  . methimazole  5 mg Oral Daily  . methylPREDNISolone (SOLU-MEDROL) injection  40 mg Intravenous Q12H  . tiotropium  18 mcg Inhalation Daily   Continuous Infusions: . sodium chloride 50 mL/hr at 11/14/17 0748    Assessment/Plan:   1. Acute hypoxic respiratory failure.  Pulse ox 86% on room air.  Supplemental oxygen ordered.  Try to keep pulse ox around 88% if possible.  I may have to do a CT scan of the chest and ABG if I can get this patient off oxygen. 2. Right  lower lobe pneumonia.  Levaquin orally. 3. COPD exacerbation, pleuritic chest pain.  Continue Solu-Medrol continue DuoNeb nebulizer solution and budesonide nebulizers. 4. Acute kidney injury on chronic kidney disease stage III.  Restart IV fluids. 5. Goiter and hyperthyroidism on methimazole. 6. Type 2 diabetes mellitus.  On large dose of Lantus twice a day. 7. Obesity.  Weight loss needed.  BMI 36.89 8. Ambulation  Code Status:     Code Status Orders  (From admission, onward)         Start     Ordered   11/12/17 1802  Full code  Continuous     11/12/17 1802        Code Status History    Date Active Date Inactive Code Status Order ID Comments User Context   04/27/2016 2025 04/28/2016 1954 Full Code [id number]  Demetrios Loll, MD Inpatient   01/15/2016 0358 01/16/2016 1752 Full Code [id number]  Hugelmeyer, Ubaldo Glassing, DO Inpatient     Family Communication: Family present Disposition Plan: Hopefully can get off oxygen and disposition soon.  Antibiotics: -Levaquin  Time spent: 28 minutes  [place]

## 2017-11-14 NOTE — Evaluation (Signed)
Physical Therapy Evaluation Patient Details Name: Emily [name] MRN: [id number] DOB: [birth year] Today's Date: 11/14/2017   History of Present Illness  Emily [name] is a 57yo female who comes to [facility] after DOE, cough, and upon arrival CXR reveals RLL PNA. PMH: COPD, DM, HLD, HTN, CVA.   Clinical Impression  Pt admitted with above diagnosis. Pt currently with functional limitations due to the deficits listed below (see "PT Problem List"). Upon entry, pt in bed, aunt, sister, and mother present. The pt is awake and agreeable to participate. The pt is alert and oriented x3, pleasant, conversational, and following simple commands consistently. Pt satting well on 3L/min, mild desat on 4L/min, and sustained O2 sats >90% AMB on 6L/min up to 162ft, then desatting thereafter. Pt has poor subjective awareness of desaturation. Functional mobility assessment demonstrates near-baseline performance, but with high O2 needs to perform simple household mobility. Pt will benefit from skilled PT intervention to increase independence and safety with basic mobility in preparation for discharge to the venue listed below.       Follow Up Recommendations No PT follow up    Equipment Recommendations  None recommended by PT    Recommendations for Other Services       Precautions / Restrictions Precautions Precautions: None Restrictions Weight Bearing Restrictions: No      Mobility  Bed Mobility Overal bed mobility: Independent                Transfers Overall transfer level: Independent                  Ambulation/Gait Ambulation/Gait assistance: Supervision Gait Distance (Feet): 360 Feet Assistive device: None Gait Pattern/deviations: WFL(Within Functional Limits) Gait velocity: 0.6m/s.       Stairs            Wheelchair Mobility    Modified Rankin (Stroke Patients Only)       Balance Overall balance assessment: Independent                                           Pertinent Vitals/Pain Pain Assessment: No/denies pain    Home Living Family/patient expects to be discharged to:: Private residence Living Arrangements: Parent Available Help at Discharge: Family Type of Home: House Home Access: Stairs to enter Entrance Stairs-Rails: None Entrance Stairs-Number of Steps: 4 Home Layout: One level Home Equipment: Environmental consultant - 2 wheels;Cane - single point;Crutches;Shower seat;Bedside commode      Prior Function Level of Independence: Needs assistance   Gait / Transfers Assistance Needed: mostly household AMB without AD; get stired easily when AMB in public  ADL's / Homemaking Assistance Needed: mom helps with sock and shoes         Hand Dominance        Extremity/Trunk Assessment   Upper Extremity Assessment Upper Extremity Assessment: Overall WFL for tasks assessed    Lower Extremity Assessment Lower Extremity Assessment: Overall WFL for tasks assessed       Communication   Communication: No difficulties  Cognition Arousal/Alertness: Awake/alert Behavior During Therapy: WFL for tasks assessed/performed Overall Cognitive Status: Within Functional Limits for tasks assessed                                        General Comments      Exercises  Assessment/Plan    PT Assessment Patient needs continued PT services  PT Problem List Cardiopulmonary status limiting activity       PT Treatment Interventions Balance training;Therapeutic activities;Functional mobility training;Therapeutic exercise;Patient/family education    PT Goals (Current goals can be found in the Care Plan section)       Frequency Min 2X/week   Barriers to discharge        Co-evaluation               AM-PAC PT "6 Clicks" Daily Activity  Outcome Measure Difficulty turning over in bed (including adjusting bedclothes, sheets and blankets)?: None Difficulty moving from lying on back to sitting on the  side of the bed? : None Difficulty sitting down on and standing up from a chair with arms (e.g., wheelchair, bedside commode, etc,.)?: None Help needed moving to and from a bed to chair (including a wheelchair)?: None Help needed walking in hospital room?: None Help needed climbing 3-5 steps with a railing? : None 6 Click Score: 24    End of Session Equipment Utilized During Treatment: Gait belt;Oxygen Activity Tolerance: Patient tolerated treatment well(no subjective symptoms of desaturation) Patient left: in bed;with nursing/sitter in room;with call bell/phone within reach;with family/visitor present Nurse Communication: Mobility status PT Visit Diagnosis: Difficulty in walking, not elsewhere classified (R26.2)    Time: 1410-1433 PT Time Calculation (min) (ACUTE ONLY): 23 min   Charges:   PT Evaluation $PT Eval Low Complexity: 1 Low PT Treatments $Therapeutic Activity: 8-22 mins        2:44 PM, 11/14/17 Etta Grandchild, PT, DPT Physical Therapist - [facility]  [phone] ([place])    Kiptyn Rafuse C 11/14/2017, 2:41 PM

## 2017-11-14 NOTE — Progress Notes (Signed)
Pt seen yesterday by IV Team for new iv site;  Ultrasound was used; very poor venous access;  Called again today to restart the iv;  Apparently the iv came out?  Attempted x 2, again with ultrasound, to place a new iv;   Unable to thread the catheters into the veins;  Veins are deep.  RN aware;  No access at present.   Raynelle Fanning RN IV Team

## 2017-11-15 ENCOUNTER — Inpatient Hospital Stay: Payer: Medicaid Other

## 2017-11-15 LAB — BLOOD GAS, ARTERIAL
Acid-base deficit: 2.5 mmol/L — ABNORMAL HIGH (ref 0.0–2.0)
BICARBONATE: 26.4 mmol/L (ref 20.0–28.0)
FIO2: 0.21
O2 SAT: 66.8 %
PATIENT TEMPERATURE: 37
PCO2 ART: 63 mmHg — AB (ref 32.0–48.0)
pH, Arterial: 7.23 — ABNORMAL LOW (ref 7.350–7.450)
pO2, Arterial: 42 mmHg — ABNORMAL LOW (ref 83.0–108.0)

## 2017-11-15 LAB — BASIC METABOLIC PANEL
Anion gap: 6 (ref 5–15)
BUN: 46 mg/dL — AB (ref 6–20)
CHLORIDE: 103 mmol/L (ref 98–111)
CO2: 27 mmol/L (ref 22–32)
CREATININE: 2.07 mg/dL — AB (ref 0.44–1.00)
Calcium: 8 mg/dL — ABNORMAL LOW (ref 8.9–10.3)
GFR calc Af Amer: 29 mL/min — ABNORMAL LOW (ref >60)
GFR calc non Af Amer: 25 mL/min — ABNORMAL LOW (ref >60)
Glucose, Bld: 239 mg/dL — ABNORMAL HIGH (ref 70–99)
POTASSIUM: 5.1 mmol/L (ref 3.5–5.1)
Sodium: 136 mmol/L (ref 135–145)

## 2017-11-15 LAB — HIV ANTIBODY (ROUTINE TESTING W REFLEX): HIV SCREEN 4TH GENERATION: NONREACTIVE

## 2017-11-15 LAB — GLUCOSE, CAPILLARY
GLUCOSE-CAPILLARY: 307 mg/dL — AB (ref 70–99)
Glucose-Capillary: 209 mg/dL — ABNORMAL HIGH (ref 70–99)
Glucose-Capillary: 335 mg/dL — ABNORMAL HIGH (ref 70–99)
Glucose-Capillary: 339 mg/dL — ABNORMAL HIGH (ref 70–99)

## 2017-11-15 MED ORDER — INSULIN ASPART 100 UNIT/ML ~~LOC~~ SOLN
6.0000 [IU] | Freq: Three times a day (TID) | SUBCUTANEOUS | Status: DC
  Administered 2017-11-15 – 2017-11-16 (×3): 6 [IU] via SUBCUTANEOUS
  Filled 2017-11-15 (×3): qty 1

## 2017-11-15 MED ORDER — INSULIN GLARGINE 100 UNIT/ML ~~LOC~~ SOLN
35.0000 [IU] | Freq: Every day | SUBCUTANEOUS | Status: DC
  Administered 2017-11-15: 35 [IU] via SUBCUTANEOUS
  Filled 2017-11-15 (×2): qty 0.35

## 2017-11-15 MED ORDER — INSULIN GLARGINE 100 UNIT/ML ~~LOC~~ SOLN
55.0000 [IU] | Freq: Every morning | SUBCUTANEOUS | Status: DC
  Administered 2017-11-16: 55 [IU] via SUBCUTANEOUS
  Filled 2017-11-15 (×2): qty 0.55

## 2017-11-15 NOTE — Progress Notes (Signed)
Inpatient Diabetes Program Recommendations  AACE/ADA: New Consensus Statement on Inpatient Glycemic Control (2019)  Target Ranges:  Prepandial:   less than 140 mg/dL      Peak postprandial:   less than 180 mg/dL (1-2 hours)      Critically ill patients:  140 - 180 mg/dL   Results for Emily [name], Emily [name] (MRN [id number]) as of 11/15/2017 13:43  Ref. Range 11/14/2017 07:40 11/14/2017 12:01 11/14/2017 16:44 11/14/2017 21:28 11/15/2017 08:26 11/15/2017 11:46  Glucose-Capillary Latest Ref Range: 70 - 99 mg/dL 220 (H) 243 (H) 356 (H) 342 (H) 209 (H) 307 (H)   Review of Glycemic Control  Diabetes history: DM2 Outpatient Diabetes medications: Trulicity 1.5 mg Qweek (Sunday), Jardiance 10 mg QAM, Glipizide 10 mg BID, Lantus 50 units QAM, Lantus 30 units QHS Current orders for Inpatient glycemic control: Lantus 50 units QAM, Lantus 30 units QHS, Novolog 3 units TID with meals, Novolog 0-15 units TID with meals, Novolog 0-5 units QHS; Solumedrol 40 mg Q12H  Inpatient Diabetes Program Recommendations:  Insulin - Basal: If steroids are continued, please consider increasing Lantus to 55 units QAM and Lantus 35 units QHS. Insulin - Meal Coverage: If steroids are continued, please consider increasing meal coverage to Novolog 6 units TID with meals if patient eats at least 50% of meals.  Thanks, Barnie Alderman, RN, MSN, CDE Diabetes Coordinator Inpatient Diabetes Program [phone] (Team Pager from 8am to 5pm)

## 2017-11-15 NOTE — Telephone Encounter (Signed)
She was recently admitted with pneumonia, I think this was the source of her problems breathing.  For now, I would suggest to keep the appointment in December.

## 2017-11-15 NOTE — Progress Notes (Signed)
Patient ID: Emily [name], female   DOB: 06/08/1960, 57 y.o.   MRN: [id number]  [facility] PROGRESS NOTE  Ulyana Pitones VEL:[id number] DOB: 01-24-1960 DOA: 11/12/2017 PCP: Gennette Pac, FNP  HPI/Subjective: Feels a little bit better every day.  States her breathing is better.  Still coughing quite a bit.  Still with some wheeze.  Objective: Vitals:   11/15/17 1036 11/15/17 1323  BP: (!) 151/95 117/74  Pulse:  77  Resp:  16  Temp:  98.2 F (36.8 C)  SpO2:  94%    Filed Weights   11/12/17 1356  Weight: 99.8 kg    ROS: Review of Systems  Constitutional: Negative for chills and fever.  Eyes: Negative for blurred vision.  Respiratory: Positive for cough, shortness of breath and wheezing.   Cardiovascular: Negative for chest pain.  Gastrointestinal: Negative for abdominal pain, constipation, diarrhea, nausea and vomiting.  Genitourinary: Negative for dysuria.  Musculoskeletal: Negative for joint pain.  Neurological: Negative for dizziness and headaches.   Exam: Physical Exam  Constitutional: She is oriented to person, place, and time.  HENT:  Nose: No mucosal edema.  Mouth/Throat: No oropharyngeal exudate or posterior oropharyngeal edema.  Eyes: Pupils are equal, round, and reactive to light. Conjunctivae, EOM and lids are normal.  Neck: No JVD present. Carotid bruit is not present. No edema present. Thyromegaly present. No thyroid mass present.  Cardiovascular: S1 normal and S2 normal. Exam reveals no gallop.  No murmur heard. Pulses:      Dorsalis pedis pulses are 2+ on the right side, and 2+ on the left side.  Respiratory: No respiratory distress. She has decreased breath sounds in the right lower field and the left lower field. She has wheezes in the right lower field and the left lower field. She has no rhonchi. She has no rales.  GI: Soft. Bowel sounds are normal. There is no tenderness.  Musculoskeletal:       Right ankle: She exhibits no swelling.        Left ankle: She exhibits no swelling.  Lymphadenopathy:    She has no cervical adenopathy.  Neurological: She is alert and oriented to person, place, and time. No cranial nerve deficit.  Skin: Skin is warm. No rash noted. Nails show no clubbing.  Psychiatric: She has a normal mood and affect.      Data Reviewed: Basic Metabolic Panel: Recent Labs  Lab 11/12/17 1630 11/13/17 0446 11/14/17 0530 11/15/17 0732  NA 143 145 141 136  K 4.1 4.0 4.6 5.1  CL 107 109 102 103  CO2 29 31 29 27   GLUCOSE 90 125* 280* 239*  BUN 17 19 32* 46*  CREATININE 1.60* 1.83* 2.11* 2.07*  CALCIUM 8.7* 8.2* 8.2* 8.0*   Liver Function Tests: Recent Labs  Lab 11/12/17 1630  AST 18  ALT 17  ALKPHOS 98  BILITOT 0.8  PROT 7.2  ALBUMIN 3.2*   CBC: Recent Labs  Lab 11/12/17 1630 11/13/17 0446  WBC 8.3 8.5  NEUTROABS 4.8  --   HGB 15.0 13.5  HCT 50.3* 45.9  MCV 98.8 101.3*  PLT 183 181    CBG: Recent Labs  Lab 11/14/17 1201 11/14/17 1644 11/14/17 2128 11/15/17 0826 11/15/17 1146  GLUCAP 243* 356* 342* 209* 307*    Recent Results (from the past 240 hour(s))  Respiratory Panel by PCR     Status: None   Collection Time: 11/12/17  7:03 PM  Result Value Ref Range Status   Adenovirus NOT DETECTED NOT  DETECTED Final   Coronavirus 229E NOT DETECTED NOT DETECTED Final   Coronavirus HKU1 NOT DETECTED NOT DETECTED Final   Coronavirus NL63 NOT DETECTED NOT DETECTED Final   Coronavirus OC43 NOT DETECTED NOT DETECTED Final   Metapneumovirus NOT DETECTED NOT DETECTED Final   Rhinovirus / Enterovirus NOT DETECTED NOT DETECTED Final   Influenza A NOT DETECTED NOT DETECTED Final   Influenza B NOT DETECTED NOT DETECTED Final   Parainfluenza Virus 1 NOT DETECTED NOT DETECTED Final   Parainfluenza Virus 2 NOT DETECTED NOT DETECTED Final   Parainfluenza Virus 3 NOT DETECTED NOT DETECTED Final   Parainfluenza Virus 4 NOT DETECTED NOT DETECTED Final   Respiratory Syncytial Virus NOT  DETECTED NOT DETECTED Final   Bordetella pertussis NOT DETECTED NOT DETECTED Final   Chlamydophila pneumoniae NOT DETECTED NOT DETECTED Final   Mycoplasma pneumoniae NOT DETECTED NOT DETECTED Final    Comment: Performed at [facility], [place] [address], [place], [place] [address]  Expectorated sputum assessment w rflx to resp cult     Status: None   Collection Time: 11/14/17  8:24 AM  Result Value Ref Range Status   Specimen Description SPUTUM  Final   Special Requests Normal  Final   Sputum evaluation   Final    THIS SPECIMEN IS ACCEPTABLE FOR SPUTUM CULTURE Performed at [facility], [address] [place], [place] [address]    Report Status 11/14/2017 FINAL  Final  Culture, respiratory     Status: None (Preliminary result)   Collection Time: 11/14/17  8:24 AM  Result Value Ref Range Status   Specimen Description   Final    SPUTUM Performed at [facility], [address] [place], [place] [address]    Special Requests   Final    Normal Reflexed from [id number] Performed at [facility], [place], [place] [address]    Gram Stain   Final    RARE SQUAMOUS EPITHELIAL CELLS PRESENT NO WBC SEEN RARE GRAM POSITIVE RODS    Culture   Final    CULTURE REINCUBATED FOR BETTER GROWTH Performed at [facility], [place] [address], [place],  [address]    Report Status PENDING  Incomplete     Studies: Ct Chest Wo Contrast  Result Date: 11/15/2017 CLINICAL DATA:  56 year old female with history of cough and shortness of breath for one and a half weeks. Abnormal chest x-ray. EXAM: CT CHEST WITHOUT CONTRAST TECHNIQUE: Multidetector CT imaging of the chest was performed following the standard protocol without IV contrast. COMPARISON:  Chest CT 01/14/2016. FINDINGS: Cardiovascular: Heart size is mildly enlarged. There is no significant pericardial fluid, thickening or pericardial calcification. There is aortic atherosclerosis, as well as  atherosclerosis of the great vessels of the mediastinum and the coronary arteries, including calcified atherosclerotic plaque in the left anterior descending and left circumflex coronary arteries. Mediastinum/Nodes: No pathologically enlarged mediastinal or hilar lymph nodes. Please note that accurate exclusion of hilar adenopathy is limited on noncontrast CT scans. Esophagus is unremarkable in appearance. No axillary lymphadenopathy. Lungs/Pleura: Patchy ground-glass attenuation and mild septal thickening noted in the periphery of the right lower lobe with peripheral areas of consolidation, favored to reflect a resolving bronchopneumonia. Linear area of scarring or atelectasis noted in the right middle lobe. No pleural effusions. No suspicious appearing pulmonary nodules or masses are noted. Upper Abdomen: Status post cholecystectomy. Aortic atherosclerosis. Musculoskeletal: There are no aggressive appearing lytic or blastic lesions noted in the visualized  portions of the skeleton. IMPRESSION: 1. The appearance of the right lower lobe suggests a resolving bronchopneumonia. There is also some scarring or subsegmental atelectasis in the right middle lobe, which could also be reflective of recently resolved infection. 2. Aortic atherosclerosis, in addition to 2 vessel coronary artery disease. Please note that although the presence of coronary artery calcium documents the presence of coronary artery disease, the severity of this disease and any potential stenosis cannot be assessed on this non-gated CT examination. Assessment for potential risk factor modification, dietary therapy or pharmacologic therapy may be warranted, if clinically indicated. 3. Mild cardiomegaly. Aortic Atherosclerosis (ICD10-I70.0). Electronically Signed   By: Vinnie Langton M.D.   On: 11/15/2017 08:18    Scheduled Meds: . amLODipine  10 mg Oral Daily  . aspirin EC  81 mg Oral Daily  . atorvastatin  40 mg Oral QHS  . budesonide  (PULMICORT) nebulizer solution  0.5 mg Nebulization BID  . carbamide peroxide  5 drop Both EARS BID  . carvedilol  25 mg Oral BID  . enoxaparin (LOVENOX) injection  40 mg Subcutaneous Q24H  . insulin aspart  0-15 Units Subcutaneous TID WC  . insulin aspart  0-5 Units Subcutaneous QHS  . insulin aspart  6 Units Subcutaneous TID WC  . insulin glargine  35 Units Subcutaneous QHS  . [START ON 11/16/2017] insulin glargine  55 Units Subcutaneous q morning - 10a  . ipratropium-albuterol  3 mL Nebulization Q6H  . levofloxacin  250 mg Oral Daily  . levofloxacin  500 mg Oral Once  . methimazole  5 mg Oral Daily  . methylPREDNISolone (SOLU-MEDROL) injection  40 mg Intravenous Q12H  . sodium chloride flush  10-40 mL Intracatheter Q12H  . tiotropium  18 mcg Inhalation Daily   Continuous Infusions: . sodium chloride 50 mL/hr at 11/15/17 1200    Assessment/Plan:   1. Acute hypoxic respiratory failure.  Still unable to get the patient off oxygen at this point.  ABG shows that she is hypoxic.  Continue oxygen supplementation.  May end up having to go home on oxygen. 2. COPD exacerbation, pleuritic chest pain.  Continue Solu-Medrol continue DuoNeb nebulizer solution and budesonide nebulizers. 3. Right lower lobe pneumonia.  Continue Levaquin 4. Acute kidney injury on chronic kidney disease stage III.  Continue IV fluids. 5. Goiter and hyperthyroidism on methimazole. 6. Type 2 diabetes mellitus.  Continue increased dose of Lantus while on steroids. 7. Obesity.  Weight loss needed.  BMI 36.89 8. Ambulation  Code Status:     Code Status Orders  (From admission, onward)         Start     Ordered   11/12/17 1802  Full code  Continuous     11/12/17 1802        Code Status History    Date Active Date Inactive Code Status Order ID Comments User Context   04/27/2016 2025 04/28/2016 1954 Full Code [id number]  Demetrios Loll, MD Inpatient   01/15/2016 0358 01/16/2016 1752 Full Code [id number]  Hugelmeyer,  Ubaldo Glassing, DO Inpatient     Family Communication: Family yesterday Disposition Plan: Hopefully can get off oxygen and disposition soon.  Antibiotics: -Levaquin  Time spent: 27 minutes  [place]

## 2017-11-16 LAB — GLUCOSE, CAPILLARY
GLUCOSE-CAPILLARY: 294 mg/dL — AB (ref 70–99)
Glucose-Capillary: 252 mg/dL — ABNORMAL HIGH (ref 70–99)
Glucose-Capillary: 368 mg/dL — ABNORMAL HIGH (ref 70–99)
Glucose-Capillary: 392 mg/dL — ABNORMAL HIGH (ref 70–99)

## 2017-11-16 LAB — BASIC METABOLIC PANEL
Anion gap: 6 (ref 5–15)
BUN: 54 mg/dL — ABNORMAL HIGH (ref 6–20)
CALCIUM: 8.1 mg/dL — AB (ref 8.9–10.3)
CHLORIDE: 104 mmol/L (ref 98–111)
CO2: 26 mmol/L (ref 22–32)
Creatinine, Ser: 2.03 mg/dL — ABNORMAL HIGH (ref 0.44–1.00)
GFR calc Af Amer: 30 mL/min — ABNORMAL LOW (ref >60)
GFR calc non Af Amer: 26 mL/min — ABNORMAL LOW (ref >60)
GLUCOSE: 286 mg/dL — AB (ref 70–99)
Potassium: 5 mmol/L (ref 3.5–5.1)
Sodium: 136 mmol/L (ref 135–145)

## 2017-11-16 LAB — CULTURE, RESPIRATORY W GRAM STAIN: Culture: NORMAL

## 2017-11-16 LAB — HEMOGLOBIN: Hemoglobin: 13.7 g/dL (ref 12.0–15.0)

## 2017-11-16 LAB — CULTURE, RESPIRATORY: SPECIAL REQUESTS: NORMAL

## 2017-11-16 MED ORDER — INSULIN ASPART 100 UNIT/ML ~~LOC~~ SOLN
10.0000 [IU] | Freq: Three times a day (TID) | SUBCUTANEOUS | Status: DC
  Administered 2017-11-16 – 2017-11-18 (×6): 10 [IU] via SUBCUTANEOUS
  Filled 2017-11-16 (×6): qty 1

## 2017-11-16 MED ORDER — INSULIN GLARGINE 100 UNIT/ML ~~LOC~~ SOLN
40.0000 [IU] | Freq: Every day | SUBCUTANEOUS | Status: DC
  Administered 2017-11-16: 40 [IU] via SUBCUTANEOUS
  Filled 2017-11-16 (×2): qty 0.4

## 2017-11-16 NOTE — Progress Notes (Signed)
Inpatient Diabetes Program Recommendations  AACE/ADA: New Consensus Statement on Inpatient Glycemic Control (2019)  Target Ranges:  Prepandial:   less than 140 mg/dL      Peak postprandial:   less than 180 mg/dL (1-2 hours)      Critically ill patients:  140 - 180 mg/dL   Results for Emily [name], Emily [name] (MRN [id number]) as of 11/16/2017 08:42  Ref. Range 11/15/2017 08:26 11/15/2017 11:46 11/15/2017 16:43 11/15/2017 21:44 11/16/2017 07:55  Glucose-Capillary Latest Ref Range: 70 - 99 mg/dL 209 (H) 307 (H) 335 (H) 339 (H) 252 (H)   Review of Glycemic Control  Diabetes history: DM2 Outpatient Diabetes medications: Trulicity 1.5 mg Qweek (Sunday), Jardiance 10 mg QAM, Glipizide 10 mg BID, Lantus 50 units QAM, Lantus 30 units QHS Current orders for Inpatient glycemic control: Lantus 55 units QAM, Lantus 35 units QHS, Novolog 6 units TID with meals, Novolog 0-15 units TID with meals, Novolog 0-5 units QHS; Solumedrol 40 mg Q12H  Inpatient Diabetes Program Recommendations:  Insulin - Basal: If steroids are continued, please consider increasing bedtime Lantus to 40 units QHS.  Insulin - Meal Coverage: If steroids are continued, please consider increasing meal coverage to Novolog 10 units TID with meals if patient eats at least 50% of meals.  Thanks, Barnie Alderman, RN, MSN, CDE Diabetes Coordinator Inpatient Diabetes Program [phone] (Team Pager from 8am to 5pm)

## 2017-11-16 NOTE — Progress Notes (Signed)
Patient ID: Emily [name], female   DOB: 1960-05-19, 57 y.o.   MRN: [id number]  [facility] PROGRESS NOTE  Oliver Neuwirth CLE:[id number] DOB: 28-Dec-1960 DOA: 11/12/2017 PCP: Gennette Pac, FNP  HPI/Subjective: Patient feels okay.  Still with some cough and some shortness of breath and wheeze.  Still unable to come off the oxygen so far.  Objective: Vitals:   11/16/17 0422 11/16/17 0800  BP: 109/65   Pulse: 82   Resp: 20   Temp: 98 F (36.7 C)   SpO2: 90% 91%    Filed Weights   11/12/17 1356  Weight: 99.8 kg    ROS: Review of Systems  Constitutional: Negative for chills and fever.  Eyes: Negative for blurred vision.  Respiratory: Positive for cough, shortness of breath and wheezing.   Cardiovascular: Negative for chest pain.  Gastrointestinal: Negative for abdominal pain, constipation, diarrhea, nausea and vomiting.  Genitourinary: Negative for dysuria.  Musculoskeletal: Negative for joint pain.  Neurological: Negative for dizziness and headaches.   Exam: Physical Exam  Constitutional: She is oriented to person, place, and time.  HENT:  Nose: No mucosal edema.  Mouth/Throat: No oropharyngeal exudate or posterior oropharyngeal edema.  Eyes: Pupils are equal, round, and reactive to light. Conjunctivae, EOM and lids are normal.  Neck: No JVD present. Carotid bruit is not present. No edema present. Thyromegaly present. No thyroid mass present.  Cardiovascular: S1 normal and S2 normal. Exam reveals no gallop.  No murmur heard. Pulses:      Dorsalis pedis pulses are 2+ on the right side, and 2+ on the left side.  Respiratory: No respiratory distress. She has decreased breath sounds in the right lower field and the left lower field. She has wheezes in the right lower field and the left lower field. She has no rhonchi. She has no rales.  GI: Soft. Bowel sounds are normal. There is no tenderness.  Musculoskeletal:       Right ankle: She exhibits no swelling.     Left ankle: She exhibits no swelling.  Lymphadenopathy:    She has no cervical adenopathy.  Neurological: She is alert and oriented to person, place, and time. No cranial nerve deficit.  Skin: Skin is warm. No rash noted. Nails show no clubbing.  Psychiatric: She has a normal mood and affect.      Data Reviewed: Basic Metabolic Panel: Recent Labs  Lab 11/12/17 1630 11/13/17 0446 11/14/17 0530 11/15/17 0732 11/16/17 0433  NA 143 145 141 136 136  K 4.1 4.0 4.6 5.1 5.0  CL 107 109 102 103 104  CO2 29 31 29 27 26   GLUCOSE 90 125* 280* 239* 286*  BUN 17 19 32* 46* 54*  CREATININE 1.60* 1.83* 2.11* 2.07* 2.03*  CALCIUM 8.7* 8.2* 8.2* 8.0* 8.1*   Liver Function Tests: Recent Labs  Lab 11/12/17 1630  AST 18  ALT 17  ALKPHOS 98  BILITOT 0.8  PROT 7.2  ALBUMIN 3.2*   CBC: Recent Labs  Lab 11/12/17 1630 11/13/17 0446 11/16/17 0433  WBC 8.3 8.5  --   NEUTROABS 4.8  --   --   HGB 15.0 13.5 13.7  HCT 50.3* 45.9  --   MCV 98.8 101.3*  --   PLT 183 181  --     CBG: Recent Labs  Lab 11/15/17 1146 11/15/17 1643 11/15/17 2144 11/16/17 0755 11/16/17 1202  GLUCAP 307* 335* 339* 252* 294*    Recent Results (from the past 240 hour(s))  Respiratory Panel by PCR  Status: None   Collection Time: 11/12/17  7:03 PM  Result Value Ref Range Status   Adenovirus NOT DETECTED NOT DETECTED Final   Coronavirus 229E NOT DETECTED NOT DETECTED Final   Coronavirus HKU1 NOT DETECTED NOT DETECTED Final   Coronavirus NL63 NOT DETECTED NOT DETECTED Final   Coronavirus OC43 NOT DETECTED NOT DETECTED Final   Metapneumovirus NOT DETECTED NOT DETECTED Final   Rhinovirus / Enterovirus NOT DETECTED NOT DETECTED Final   Influenza A NOT DETECTED NOT DETECTED Final   Influenza B NOT DETECTED NOT DETECTED Final   Parainfluenza Virus 1 NOT DETECTED NOT DETECTED Final   Parainfluenza Virus 2 NOT DETECTED NOT DETECTED Final   Parainfluenza Virus 3 NOT DETECTED NOT DETECTED Final    Parainfluenza Virus 4 NOT DETECTED NOT DETECTED Final   Respiratory Syncytial Virus NOT DETECTED NOT DETECTED Final   Bordetella pertussis NOT DETECTED NOT DETECTED Final   Chlamydophila pneumoniae NOT DETECTED NOT DETECTED Final   Mycoplasma pneumoniae NOT DETECTED NOT DETECTED Final    Comment: Performed at [facility], [place] [address] [place], [place] [address]  Expectorated sputum assessment w rflx to resp cult     Status: None   Collection Time: 11/14/17  8:24 AM  Result Value Ref Range Status   Specimen Description SPUTUM  Final   Special Requests Normal  Final   Sputum evaluation   Final    THIS SPECIMEN IS ACCEPTABLE FOR SPUTUM CULTURE Performed at [facility], [address], [place], [place] [address]    Report Status 11/14/2017 FINAL  Final  Culture, respiratory     Status: None   Collection Time: 11/14/17  8:24 AM  Result Value Ref Range Status   Specimen Description   Final    SPUTUM Performed at [facility], [address] [place], [place] [address]    Special Requests   Final    Normal Reflexed from [id number] Performed at [facility], [place], [place] [address]    Gram Stain   Final    RARE SQUAMOUS EPITHELIAL CELLS PRESENT NO WBC SEEN RARE GRAM POSITIVE RODS    Culture   Final    FEW Consistent with normal respiratory flora. Performed at [facility], [place] [address] [place], [place] [address]    Report Status 11/16/2017 FINAL  Final     Studies: Ct Chest Wo Contrast  Result Date: 11/15/2017 CLINICAL DATA:  57 year old female with history of cough and shortness of breath for one and a half weeks. Abnormal chest x-ray. EXAM: CT CHEST WITHOUT CONTRAST TECHNIQUE: Multidetector CT imaging of the chest was performed following the standard protocol without IV contrast. COMPARISON:  Chest CT 01/14/2016. FINDINGS: Cardiovascular: Heart size is mildly enlarged. There is no significant pericardial fluid,  thickening or pericardial calcification. There is aortic atherosclerosis, as well as atherosclerosis of the great vessels of the mediastinum and the coronary arteries, including calcified atherosclerotic plaque in the left anterior descending and left circumflex coronary arteries. Mediastinum/Nodes: No pathologically enlarged mediastinal or hilar lymph nodes. Please note that accurate exclusion of hilar adenopathy is limited on noncontrast CT scans. Esophagus is unremarkable in appearance. No axillary lymphadenopathy. Lungs/Pleura: Patchy ground-glass attenuation and mild septal thickening noted in the periphery of the right lower lobe with peripheral areas of consolidation, favored to reflect a resolving bronchopneumonia. Linear area of scarring or atelectasis noted in the right middle lobe. No pleural effusions. No suspicious appearing pulmonary nodules or masses are  noted. Upper Abdomen: Status post cholecystectomy. Aortic atherosclerosis. Musculoskeletal: There are no aggressive appearing lytic or blastic lesions noted in the visualized portions of the skeleton. IMPRESSION: 1. The appearance of the right lower lobe suggests a resolving bronchopneumonia. There is also some scarring or subsegmental atelectasis in the right middle lobe, which could also be reflective of recently resolved infection. 2. Aortic atherosclerosis, in addition to 2 vessel coronary artery disease. Please note that although the presence of coronary artery calcium documents the presence of coronary artery disease, the severity of this disease and any potential stenosis cannot be assessed on this non-gated CT examination. Assessment for potential risk factor modification, dietary therapy or pharmacologic therapy may be warranted, if clinically indicated. 3. Mild cardiomegaly. Aortic Atherosclerosis (ICD10-I70.0). Electronically Signed   By: Vinnie Langton M.D.   On: 11/15/2017 08:18    Scheduled Meds: . amLODipine  10 mg Oral Daily  .  aspirin EC  81 mg Oral Daily  . atorvastatin  40 mg Oral QHS  . budesonide (PULMICORT) nebulizer solution  0.5 mg Nebulization BID  . carbamide peroxide  5 drop Both EARS BID  . carvedilol  25 mg Oral BID  . enoxaparin (LOVENOX) injection  40 mg Subcutaneous Q24H  . insulin aspart  0-15 Units Subcutaneous TID WC  . insulin aspart  0-5 Units Subcutaneous QHS  . insulin aspart  6 Units Subcutaneous TID WC  . insulin glargine  35 Units Subcutaneous QHS  . insulin glargine  55 Units Subcutaneous q morning - 10a  . ipratropium-albuterol  3 mL Nebulization Q6H  . levofloxacin  250 mg Oral Daily  . levofloxacin  500 mg Oral Once  . methimazole  5 mg Oral Daily  . methylPREDNISolone (SOLU-MEDROL) injection  40 mg Intravenous Q12H  . sodium chloride flush  10-40 mL Intracatheter Q12H  . tiotropium  18 mcg Inhalation Daily   Continuous Infusions: . sodium chloride 50 mL/hr at 11/15/17 2200    Assessment/Plan:   1. Acute hypoxic respiratory failure.  Still unable to get the patient off oxygen at this point.  ABG shows that she is hypoxic.  Continue oxygen supplementation.  May end up having to go home on oxygen.  Check a pulse ox daily with ambulation on room air to see if we can come off the oxygen. 2. COPD exacerbation, pleuritic chest pain.  Continue Solu-Medrol continue DuoNeb nebulizer solution and budesonide nebulizers. 3. Right lower lobe pneumonia.  Continue Levaquin 4. Acute kidney injury on chronic kidney disease stage III.  Continue IV fluids.  Creatinine elevated 2.03. 5. Goiter and hyperthyroidism on methimazole. 6. Type 2 diabetes mellitus.  Continue increased dose of Lantus while on steroids. 7. Obesity.  Weight loss needed.  BMI 36.89 8. Ambulation  Code Status:     Code Status Orders  (From admission, onward)         Start     Ordered   11/12/17 1802  Full code  Continuous     11/12/17 1802        Code Status History    Date Active Date Inactive Code Status  Order ID Comments User Context   04/27/2016 2025 04/28/2016 1954 Full Code [id number]  Demetrios Loll, MD Inpatient   01/15/2016 0358 01/16/2016 1752 Full Code [id number]  Hugelmeyer, Ubaldo Glassing, DO Inpatient     Family Communication: Family yesterday Disposition Plan: Try to get off oxygen.  Antibiotics: -Levaquin  Time spent: 27 minutes  [place]

## 2017-11-16 NOTE — Progress Notes (Signed)
SATURATION QUALIFICATIONS: (This note is used to comply with regulatory documentation for home oxygen)  Patient Saturations on Room Air at Rest = 94%  Patient Saturations on Room Air while Ambulating = 71%  Patient Saturations on 3 Liters of oxygen while Ambulating = 92%

## 2017-11-16 NOTE — Telephone Encounter (Signed)
Patient is still in the hospital 

## 2017-11-17 LAB — BASIC METABOLIC PANEL
ANION GAP: 8 (ref 5–15)
BUN: 63 mg/dL — ABNORMAL HIGH (ref 6–20)
CALCIUM: 8.3 mg/dL — AB (ref 8.9–10.3)
CO2: 25 mmol/L (ref 22–32)
Chloride: 104 mmol/L (ref 98–111)
Creatinine, Ser: 2.05 mg/dL — ABNORMAL HIGH (ref 0.44–1.00)
GFR, EST AFRICAN AMERICAN: 30 mL/min — AB (ref >60)
GFR, EST NON AFRICAN AMERICAN: 26 mL/min — AB (ref >60)
Glucose, Bld: 350 mg/dL — ABNORMAL HIGH (ref 70–99)
POTASSIUM: 5.3 mmol/L — AB (ref 3.5–5.1)
Sodium: 137 mmol/L (ref 135–145)

## 2017-11-17 LAB — GLUCOSE, CAPILLARY
GLUCOSE-CAPILLARY: 349 mg/dL — AB (ref 70–99)
GLUCOSE-CAPILLARY: 394 mg/dL — AB (ref 70–99)
Glucose-Capillary: 300 mg/dL — ABNORMAL HIGH (ref 70–99)
Glucose-Capillary: 303 mg/dL — ABNORMAL HIGH (ref 70–99)

## 2017-11-17 MED ORDER — SODIUM POLYSTYRENE SULFONATE 15 GM/60ML PO SUSP
30.0000 g | Freq: Once | ORAL | Status: AC
  Administered 2017-11-17: 30 g via ORAL
  Filled 2017-11-17: qty 120

## 2017-11-17 MED ORDER — INSULIN GLARGINE 100 UNIT/ML ~~LOC~~ SOLN
60.0000 [IU] | Freq: Every morning | SUBCUTANEOUS | Status: DC
  Administered 2017-11-17 – 2017-11-18 (×2): 60 [IU] via SUBCUTANEOUS
  Filled 2017-11-17 (×3): qty 0.6

## 2017-11-17 MED ORDER — IPRATROPIUM-ALBUTEROL 0.5-2.5 (3) MG/3ML IN SOLN
3.0000 mL | Freq: Three times a day (TID) | RESPIRATORY_TRACT | Status: DC
  Administered 2017-11-18: 3 mL via RESPIRATORY_TRACT
  Filled 2017-11-17 (×3): qty 3

## 2017-11-17 MED ORDER — METHYLPREDNISOLONE SODIUM SUCC 40 MG IJ SOLR
40.0000 mg | Freq: Every day | INTRAMUSCULAR | Status: DC
  Administered 2017-11-18: 40 mg via INTRAVENOUS
  Filled 2017-11-17: qty 1

## 2017-11-17 MED ORDER — INSULIN GLARGINE 100 UNIT/ML ~~LOC~~ SOLN
45.0000 [IU] | Freq: Every day | SUBCUTANEOUS | Status: DC
  Administered 2017-11-17: 45 [IU] via SUBCUTANEOUS
  Filled 2017-11-17 (×2): qty 0.45

## 2017-11-17 NOTE — Progress Notes (Signed)
Inpatient Diabetes Program Recommendations  AACE/ADA: New Consensus Statement on Inpatient Glycemic Control (2019)  Target Ranges:  Prepandial:   less than 140 mg/dL      Peak postprandial:   less than 180 mg/dL (1-2 hours)      Critically ill patients:  140 - 180 mg/dL   Results for Emily [name], Emily [name] (MRN [id number]) as of 11/17/2017 10:09  Ref. Range 11/16/2017 07:55 11/16/2017 12:02 11/16/2017 17:12 11/16/2017 21:34 11/17/2017 07:36  Glucose-Capillary Latest Ref Range: 70 - 99 mg/dL 252 (H) 294 (H) 368 (H) 392 (H) 300 (H)   Review of Glycemic Control  Diabetes history:DM2 Outpatient Diabetes medications:Trulicity 1.5 mg Qweek (Sunday), Jardiance 10 mg QAM, Glipizide 10 mg BID, Lantus 50 units QAM, Lantus 30 units QHS Current orders for Inpatient glycemic control:Lantus 55 units QAM, Lantus 40 units QHS, Novolog 10 units TID with meals, Novolog 0-15 units TID with meals, Novolog 0-5 units QHS; Solumedrol 40 mg Q12H  Inpatient Diabetes Program Recommendations: Insulin - Basal:If steroids are continued, please consider increasing Lantus to 60 units QAM and Lantus 45 units QHS.    Thanks, Barnie Alderman, RN, MSN, CDE Diabetes Coordinator Inpatient Diabetes Program [phone] (Team Pager from 8am to 5pm)

## 2017-11-17 NOTE — Progress Notes (Signed)
Patient ID: Emily [name], female   DOB: 1960-10-21, 57 y.o.   MRN: [id number]  [facility] PROGRESS NOTE  Julie Nay UKG:[id number] DOB: 02/19/1960 DOA: 11/12/2017 PCP: Gennette Pac, FNP  HPI/Subjective: Patient feeling okay.  No further pleuritic chest pain.  Still with some cough and shortness of breath.  Objective: Vitals:   11/17/17 1144 11/17/17 1357  BP: (!) 147/88   Pulse: 76   Resp:    Temp: 97.7 F (36.5 C)   SpO2: 91% 92%    Filed Weights   11/12/17 1356  Weight: 99.8 kg    ROS: Review of Systems  Constitutional: Negative for chills and fever.  Eyes: Negative for blurred vision.  Respiratory: Positive for cough, shortness of breath and wheezing.   Cardiovascular: Negative for chest pain.  Gastrointestinal: Negative for abdominal pain, constipation, diarrhea, nausea and vomiting.  Genitourinary: Negative for dysuria.  Musculoskeletal: Negative for joint pain.  Neurological: Negative for dizziness and headaches.   Exam: Physical Exam  Constitutional: She is oriented to person, place, and time.  HENT:  Nose: No mucosal edema.  Mouth/Throat: No oropharyngeal exudate or posterior oropharyngeal edema.  Eyes: Pupils are equal, round, and reactive to light. Conjunctivae, EOM and lids are normal.  Neck: No JVD present. Carotid bruit is not present. No edema present. Thyromegaly present. No thyroid mass present.  Cardiovascular: S1 normal and S2 normal. Exam reveals no gallop.  No murmur heard. Pulses:      Dorsalis pedis pulses are 2+ on the right side, and 2+ on the left side.  Respiratory: No respiratory distress. She has decreased breath sounds in the right lower field and the left lower field. She has no wheezes. She has no rhonchi. She has no rales.  GI: Soft. Bowel sounds are normal. There is no tenderness.  Musculoskeletal:       Right ankle: She exhibits no swelling.       Left ankle: She exhibits no swelling.  Lymphadenopathy:    She  has no cervical adenopathy.  Neurological: She is alert and oriented to person, place, and time. No cranial nerve deficit.  Skin: Skin is warm. No rash noted. Nails show no clubbing.  Psychiatric: She has a normal mood and affect.      Data Reviewed: Basic Metabolic Panel: Recent Labs  Lab 11/13/17 0446 11/14/17 0530 11/15/17 0732 11/16/17 0433 11/17/17 0508  NA 145 141 136 136 137  K 4.0 4.6 5.1 5.0 5.3*  CL 109 102 103 104 104  CO2 31 29 27 26 25   GLUCOSE 125* 280* 239* 286* 350*  BUN 19 32* 46* 54* 63*  CREATININE 1.83* 2.11* 2.07* 2.03* 2.05*  CALCIUM 8.2* 8.2* 8.0* 8.1* 8.3*   Liver Function Tests: Recent Labs  Lab 11/12/17 1630  AST 18  ALT 17  ALKPHOS 98  BILITOT 0.8  PROT 7.2  ALBUMIN 3.2*   CBC: Recent Labs  Lab 11/12/17 1630 11/13/17 0446 11/16/17 0433  WBC 8.3 8.5  --   NEUTROABS 4.8  --   --   HGB 15.0 13.5 13.7  HCT 50.3* 45.9  --   MCV 98.8 101.3*  --   PLT 183 181  --     CBG: Recent Labs  Lab 11/16/17 1202 11/16/17 1712 11/16/17 2134 11/17/17 0736 11/17/17 1143  GLUCAP 294* 368* 392* 300* 303*    Recent Results (from the past 240 hour(s))  Respiratory Panel by PCR     Status: None   Collection Time: 11/12/17  7:03  PM  Result Value Ref Range Status   Adenovirus NOT DETECTED NOT DETECTED Final   Coronavirus 229E NOT DETECTED NOT DETECTED Final   Coronavirus HKU1 NOT DETECTED NOT DETECTED Final   Coronavirus NL63 NOT DETECTED NOT DETECTED Final   Coronavirus OC43 NOT DETECTED NOT DETECTED Final   Metapneumovirus NOT DETECTED NOT DETECTED Final   Rhinovirus / Enterovirus NOT DETECTED NOT DETECTED Final   Influenza A NOT DETECTED NOT DETECTED Final   Influenza B NOT DETECTED NOT DETECTED Final   Parainfluenza Virus 1 NOT DETECTED NOT DETECTED Final   Parainfluenza Virus 2 NOT DETECTED NOT DETECTED Final   Parainfluenza Virus 3 NOT DETECTED NOT DETECTED Final   Parainfluenza Virus 4 NOT DETECTED NOT DETECTED Final   Respiratory  Syncytial Virus NOT DETECTED NOT DETECTED Final   Bordetella pertussis NOT DETECTED NOT DETECTED Final   Chlamydophila pneumoniae NOT DETECTED NOT DETECTED Final   Mycoplasma pneumoniae NOT DETECTED NOT DETECTED Final    Comment: Performed at [facility], [place] [address], [place], [place] [address]  Expectorated sputum assessment w rflx to resp cult     Status: None   Collection Time: 11/14/17  8:24 AM  Result Value Ref Range Status   Specimen Description SPUTUM  Final   Special Requests Normal  Final   Sputum evaluation   Final    THIS SPECIMEN IS ACCEPTABLE FOR SPUTUM CULTURE Performed at [facility], [address] [place], [place] [address]    Report Status 11/14/2017 FINAL  Final  Culture, respiratory     Status: None   Collection Time: 11/14/17  8:24 AM  Result Value Ref Range Status   Specimen Description   Final    SPUTUM Performed at [facility], [address] [place], [place] [address]    Special Requests   Final    Normal Reflexed from [id number] Performed at [facility], [place], [place] [address]    Gram Stain   Final    RARE SQUAMOUS EPITHELIAL CELLS PRESENT NO WBC SEEN RARE GRAM POSITIVE RODS    Culture   Final    FEW Consistent with normal respiratory flora. Performed at [facility], [place] [address] [place],  [address]    Report Status 11/16/2017 FINAL  Final     Studies: No results found.  Scheduled Meds: . amLODipine  10 mg Oral Daily  . aspirin EC  81 mg Oral Daily  . atorvastatin  40 mg Oral QHS  . budesonide (PULMICORT) nebulizer solution  0.5 mg Nebulization BID  . carbamide peroxide  5 drop Both EARS BID  . carvedilol  25 mg Oral BID  . enoxaparin (LOVENOX) injection  40 mg Subcutaneous Q24H  . insulin aspart  0-15 Units Subcutaneous TID WC  . insulin aspart  0-5 Units Subcutaneous QHS  . insulin aspart  10 Units Subcutaneous TID WC  . insulin glargine  45 Units Subcutaneous  QHS  . insulin glargine  60 Units Subcutaneous q morning - 10a  . ipratropium-albuterol  3 mL Nebulization Q6H  . levofloxacin  250 mg Oral Daily  . levofloxacin  500 mg Oral Once  . methimazole  5 mg Oral Daily  . [START ON 11/18/2017] methylPREDNISolone (SOLU-MEDROL) injection  40 mg Intravenous Daily  . sodium chloride flush  10-40 mL Intracatheter Q12H  . tiotropium  18 mcg Inhalation Daily   Continuous Infusions: . sodium chloride 50 mL/hr at 11/17/17 1214  Assessment/Plan:   1. Acute hypoxic respiratory failure.  Still unable to get the patient off oxygen at this point.  Patient qualifies for oxygen to go home with.  Spoke with care manager about setting up 24/7 oxygen at home.  Potentially for tomorrow. 2. COPD exacerbation, pleuritic chest pain.  Decrease dose of Solu-Medrol to 40 mg IV daily.  Continue nebulizer treatments. 3. Right lower lobe pneumonia.  Continue Levaquin 4. Acute kidney injury on chronic kidney disease stage III and hyperkalemia.  Patient had 2 bowel movements with Kayexalate.  Recheck BMP tomorrow morning.  Increase IV fluid hydration. 5. Goiter and hyperthyroidism on methimazole. 6. Type 2 diabetes mellitus.  Continue increased dose of Lantus while on steroids. 7. Obesity.  Weight loss needed.  BMI 36.89 8. Ambulation  Code Status:     Code Status Orders  (From admission, onward)         Start     Ordered   11/12/17 1802  Full code  Continuous     11/12/17 1802        Code Status History    Date Active Date Inactive Code Status Order ID Comments User Context   04/27/2016 2025 04/28/2016 1954 Full Code [id number]  Demetrios Loll, MD Inpatient   01/15/2016 0358 01/16/2016 1752 Full Code [id number]  Hugelmeyer, Ubaldo Glassing, DO Inpatient     Family Communication: Tried to call mother on the phone but there was no answer Disposition Plan: Potentially home tomorrow with oxygen  Antibiotics: -Levaquin  Time spent: 26 minutes  [place]

## 2017-11-17 NOTE — Progress Notes (Signed)
SATURATION QUALIFICATIONS: (This note is used to comply with regulatory documentation for home oxygen)  Patient Saturations on Room Air at Rest = 83%   Patient Saturations on 3 Liters of oxygen while at rest= 91%  Emily [name] [employer]

## 2017-11-18 LAB — BASIC METABOLIC PANEL
ANION GAP: 4 — AB (ref 5–15)
BUN: 59 mg/dL — AB (ref 6–20)
CALCIUM: 8.2 mg/dL — AB (ref 8.9–10.3)
CO2: 28 mmol/L (ref 22–32)
CREATININE: 1.92 mg/dL — AB (ref 0.44–1.00)
Chloride: 109 mmol/L (ref 98–111)
GFR calc Af Amer: 32 mL/min — ABNORMAL LOW (ref >60)
GFR calc non Af Amer: 28 mL/min — ABNORMAL LOW (ref >60)
GLUCOSE: 259 mg/dL — AB (ref 70–99)
Potassium: 4.5 mmol/L (ref 3.5–5.1)
Sodium: 141 mmol/L (ref 135–145)

## 2017-11-18 LAB — GLUCOSE, CAPILLARY
Glucose-Capillary: 181 mg/dL — ABNORMAL HIGH (ref 70–99)
Glucose-Capillary: 213 mg/dL — ABNORMAL HIGH (ref 70–99)

## 2017-11-18 MED ORDER — ALBUTEROL SULFATE (2.5 MG/3ML) 0.083% IN NEBU: 2.5000 mg | INHALATION_SOLUTION | Freq: Four times a day (QID) | RESPIRATORY_TRACT | 0 refills | Status: DC | PRN

## 2017-11-18 MED ORDER — LEVOFLOXACIN 250 MG PO TABS: 250.0000 mg | ORAL_TABLET | Freq: Every day | ORAL | 0 refills | Status: DC

## 2017-11-18 MED ORDER — INSULIN ASPART 100 UNIT/ML CARTRIDGE (PENFILL): 6.0000 [IU] | Freq: Three times a day (TID) | SUBCUTANEOUS | 0 refills | Status: DC

## 2017-11-18 MED ORDER — BUDESONIDE-FORMOTEROL FUMARATE 80-4.5 MCG/ACT IN AERO: 2.0000 | INHALATION_SPRAY | Freq: Two times a day (BID) | RESPIRATORY_TRACT | 12 refills | Status: DC

## 2017-11-18 NOTE — Progress Notes (Signed)
Inpatient Diabetes Program Recommendations  AACE/ADA: New Consensus Statement on Inpatient Glycemic Control (2019)  Target Ranges:  Prepandial:   less than 140 mg/dL      Peak postprandial:   less than 180 mg/dL (1-2 hours)      Critically ill patients:  140 - 180 mg/dL   Results for Emily [name], Emily [name] (MRN [id number]) as of 11/18/2017 08:30  Ref. Range 11/17/2017 07:36 11/17/2017 11:43 11/17/2017 16:27 11/17/2017 21:02 11/18/2017 07:36  Glucose-Capillary Latest Ref Range: 70 - 99 mg/dL 300 (H) 303 (H) 349 (H) 394 (H) 181 (H)   Review of Glycemic Control  Diabetes history:DM2 Outpatient Diabetes medications:Trulicity 1.5 mg Qweek (Sunday), Jardiance 10 mg QAM, Glipizide 10 mg BID, Lantus 50 units QAM, Lantus 30 units QHS Current orders for Inpatient glycemic control:Lantus 60units QAM, Lantus 45units QHS, Novolog 10 units TID with meals, Novolog 0-15 units TID with meals, Novolog 0-5 units QHS; Solumedrol 40 mg daily  Inpatient Diabetes Program Recommendations: Insulin -Meal Coverage: If steroids are continued as ordered, please consider increasing meal coverage to Novolog 14 units TID with meals.  Thanks, Barnie Alderman, RN, MSN, CDE Diabetes Coordinator Inpatient Diabetes Program [phone] (Team Pager from 8am to 5pm)

## 2017-11-18 NOTE — Progress Notes (Signed)
[facility]  Emergency contact: Arnetha Courser (sister) [phone]  Roby Spalla (aunt) [phone]  Florene Glen 11/18/2017, 11:37 AM

## 2017-11-18 NOTE — Discharge Summary (Signed)
[facility] [place] NAME: Emily [name]    MR#:  [id number]  DATE OF BIRTH:  [birth year]  DATE OF ADMISSION:  11/12/2017 ADMITTING PHYSICIAN: Loletha Grayer, MD  DATE OF DISCHARGE: 11/18/2017  PRIMARY CARE PHYSICIAN: Gennette Pac, FNP    ADMISSION DIAGNOSIS:  SOB   DISCHARGE DIAGNOSIS:  1.  Acute hypoxic respiratory failure now chronic respiratory failure 2. COPD exacerbation 3.  Pneumonia  SECONDARY DIAGNOSIS:   Past Medical History:  Diagnosis Date  . Asthma   . COPD (chronic obstructive pulmonary disease) ([place])   . Diabetes mellitus without complication ([place])   . Hyperlipidemia   . Hypertension   . Stroke [facility])    Was told at recent hospital visit she has stroke in past  . Thyroid disease   . Thyroid nodule 04/30/2016    HOSPITAL COURSE:   1.  Acute hypoxic respiratory failure now chronic.  The patient was unable to come off oxygen during this hospital stay.  Today pulse ox in the 80s just at rest.  24/7 oxygen needed at home this was explained to the patient and the patient's mother.  Chronic 2 L of oxygen prescribed.  Can go up to 3 L if needed. 2.  COPD exacerbation with pleuritic chest pain.  Patient's pleuritic chest pain has gone away.  Patient finished her steroids here in the hospital.  Nebulizer and albuterol nebulizer prescribed.  Continue albuterol inhaler, Spiriva, add Symbicort.  Follow-up alpha-1 antitrypsin level. 3.  Right lower lobe pneumonia.  Only needs 1 more day of Levaquin 4.  Acute kidney injury on chronic kidney disease stage III.  Hyperkalemia during the hospital course.  I did have a give a dose of Kayexalate here in the hospital.  ACE inhibitor held.  Creatinine improved to 1.92 upon discharge.  Follow-up BMP and follow-up appointment 5.  Goiter and hyperthyroidism on methimazole 6.  Type 2 diabetes mellitus.  Continue Lantus dose.  Can go back on oral medications except for the glipizide. 7.   Obesity.  Weight loss needed.  BMI 36.89.  DISCHARGE CONDITIONS:   Satisfactory  CONSULTS OBTAINED:  None  DRUG ALLERGIES:  No Known Allergies  DISCHARGE MEDICATIONS:   Allergies as of 11/18/2017   No Known Allergies     Medication List    STOP taking these medications   glipiZIDE 10 MG tablet Commonly known as:  GLUCOTROL   lisinopril 10 MG tablet Commonly known as:  PRINIVIL,ZESTRIL     TAKE these medications   albuterol 108 (90 Base) MCG/ACT inhaler Commonly known as:  PROVENTIL HFA;VENTOLIN HFA Inhale 1-2 puffs into the lungs every 6 (six) hours as needed for wheezing or shortness of breath. What changed:  Another medication with the same name was added. Make sure you understand how and when to take each.   albuterol (2.5 MG/3ML) 0.083% nebulizer solution Commonly known as:  PROVENTIL Take 3 mLs (2.5 mg total) by nebulization every 6 (six) hours as needed for wheezing or shortness of breath. What changed:  You were already taking a medication with the same name, and this prescription was added. Make sure you understand how and when to take each.   amLODipine 10 MG tablet Commonly known as:  NORVASC Take 10 mg by mouth daily.   aspirin EC 81 MG tablet Take 81 mg by mouth daily.   atorvastatin 40 MG tablet Commonly known as:  LIPITOR TAKE ONE TABLET BY MOUTH EVERY DAY What changed:  when  to take this   budesonide-formoterol 80-4.5 MCG/ACT inhaler Commonly known as:  SYMBICORT Inhale 2 puffs into the lungs 2 (two) times daily.   carvedilol 25 MG tablet Commonly known as:  COREG TAKE 1 TABLET BY MOUTH TWICE DAILY   cyclobenzaprine 5 MG tablet Commonly known as:  FLEXERIL TAKE ONE TABLET BY MOUTH EVERY DAY AS NEEDED FOR MUSCLE SPASMS What changed:  See the new instructions.   Dulaglutide 1.5 MG/0.5ML Sopn Inject 1.5 mg into the skin once a week. What changed:  when to take this   empagliflozin 10 MG Tabs tablet Commonly known as:  JARDIANCE Take 10  mg by mouth daily. Before b'fast What changed:    when to take this  additional instructions   insulin aspart cartridge Commonly known as:  NOVOLOG Inject 6 Units into the skin 3 (three) times daily with meals. Can substitute any short acting insulin that is covered in a pen   Insulin Glargine 100 UNIT/ML Solostar Pen Commonly known as:  LANTUS Inject under skin 50 units in am and 30 units at night What changed:    how much to take  how to take this  when to take this  additional instructions   levofloxacin 250 MG tablet Commonly known as:  LEVAQUIN Take 1 tablet (250 mg total) by mouth daily. Start taking on:  11/20/2017   methimazole 5 MG tablet Commonly known as:  TAPAZOLE Take 1 tablet (5 mg total) by mouth daily.   tiotropium 18 MCG inhalation capsule Commonly known as:  SPIRIVA Place 18 mcg into inhaler and inhale daily.            Durable Medical Equipment  (From admission, onward)         Start     Ordered   11/18/17 1221  For home use only DME oxygen  Once    Question Answer Comment  Mode or (Route) Nasal cannula   Liters per Minute 2   Frequency Continuous (stationary and portable oxygen unit needed)   Oxygen conserving device Yes   Oxygen delivery system Gas      11/18/17 1220   11/18/17 0945  For home use only DME Nebulizer machine  Once    Question:  Patient needs a nebulizer to treat with the following condition  Answer:  COPD exacerbation ([place])   11/18/17 0945           DISCHARGE INSTRUCTIONS:   Follow-up PMD 5 days.  If you experience worsening of your admission symptoms, develop shortness of breath, life threatening emergency, suicidal or homicidal thoughts you must seek medical attention immediately by calling 911 or calling your MD immediately  if symptoms less severe.  You Must read complete instructions/literature along with all the possible adverse reactions/side effects for all the Medicines you take and that have been  prescribed to you. Take any new Medicines after you have completely understood and accept all the possible adverse reactions/side effects.   Please note  You were cared for by a hospitalist during your hospital stay. If you have any questions about your discharge medications or the care you received while you were in the hospital after you are discharged, you can call the unit and asked to speak with the hospitalist on call if the hospitalist that took care of you is not available. Once you are discharged, your primary care physician will handle any further medical issues. Please note that NO REFILLS for any discharge medications will be authorized once you are  discharged, as it is imperative that you return to your primary care physician (or establish a relationship with a primary care physician if you do not have one) for your aftercare needs so that they can reassess your need for medications and monitor your lab values.    Today   CHIEF COMPLAINT:   Chief Complaint  Patient presents with  . Cough    HISTORY OF PRESENT ILLNESS:  Emily [name]  is a 57 y.o. female with a known history presented with cough and shortness of breath.   VITAL SIGNS:  Blood pressure (!) 144/84, pulse 78, temperature 97.8 F (36.6 C), temperature source Oral, resp. rate 20, weight 99.8 kg, SpO2 (!) 89 %.    PHYSICAL EXAMINATION:  GENERAL:  57 y.o.-year-old patient lying in the bed with no acute distress.  EYES: Pupils equal, round, reactive to light and accommodation. No scleral icterus. Extraocular muscles intact.  HEENT: Head atraumatic, normocephalic. Oropharynx and nasopharynx clear.  NECK:  Supple, no jugular venous distention. No thyroid enlargement, no tenderness.  LUNGS: Decreased breath sounds bilateral bases, no wheezing, rales,rhonchi or crepitation. No use of accessory muscles of respiration.  CARDIOVASCULAR: S1, S2 normal. No murmurs, rubs, or gallops.  ABDOMEN: Soft, non-tender,  non-distended. Bowel sounds present. No organomegaly or mass.  EXTREMITIES: No pedal edema, cyanosis, or clubbing.  NEUROLOGIC: Cranial nerves II through XII are intact. Muscle strength 5/5 in all extremities. Sensation intact. Gait not checked.  PSYCHIATRIC: The patient is alert and oriented x 3.  SKIN: No obvious rash, lesion, or ulcer.   DATA REVIEW:   CBC Recent Labs  Lab 11/13/17 0446 11/16/17 0433  WBC 8.5  --   HGB 13.5 13.7  HCT 45.9  --   PLT 181  --     Chemistries  Recent Labs  Lab 11/12/17 1630  11/18/17 0433  NA 143   < > 141  K 4.1   < > 4.5  CL 107   < > 109  CO2 29   < > 28  GLUCOSE 90   < > 259*  BUN 17   < > 59*  CREATININE 1.60*   < > 1.92*  CALCIUM 8.7*   < > 8.2*  AST 18  --   --   ALT 17  --   --   ALKPHOS 98  --   --   BILITOT 0.8  --   --    < > = values in this interval not displayed.     Microbiology Results  Results for orders placed or performed during the hospital encounter of 11/12/17  Respiratory Panel by PCR     Status: None   Collection Time: 11/12/17  7:03 PM  Result Value Ref Range Status   Adenovirus NOT DETECTED NOT DETECTED Final   Coronavirus 229E NOT DETECTED NOT DETECTED Final   Coronavirus HKU1 NOT DETECTED NOT DETECTED Final   Coronavirus NL63 NOT DETECTED NOT DETECTED Final   Coronavirus OC43 NOT DETECTED NOT DETECTED Final   Metapneumovirus NOT DETECTED NOT DETECTED Final   Rhinovirus / Enterovirus NOT DETECTED NOT DETECTED Final   Influenza A NOT DETECTED NOT DETECTED Final   Influenza B NOT DETECTED NOT DETECTED Final   Parainfluenza Virus 1 NOT DETECTED NOT DETECTED Final   Parainfluenza Virus 2 NOT DETECTED NOT DETECTED Final   Parainfluenza Virus 3 NOT DETECTED NOT DETECTED Final   Parainfluenza Virus 4 NOT DETECTED NOT DETECTED Final   Respiratory Syncytial Virus NOT DETECTED NOT  DETECTED Final   Bordetella pertussis NOT DETECTED NOT DETECTED Final   Chlamydophila pneumoniae NOT DETECTED NOT DETECTED Final    Mycoplasma pneumoniae NOT DETECTED NOT DETECTED Final    Comment: Performed at [facility], [place] [address] [place], [place] [address]  Expectorated sputum assessment w rflx to resp cult     Status: None   Collection Time: 11/14/17  8:24 AM  Result Value Ref Range Status   Specimen Description SPUTUM  Final   Special Requests Normal  Final   Sputum evaluation   Final    THIS SPECIMEN IS ACCEPTABLE FOR SPUTUM CULTURE Performed at [facility], [address] [place], [place] [address]    Report Status 11/14/2017 FINAL  Final  Culture, respiratory     Status: None   Collection Time: 11/14/17  8:24 AM  Result Value Ref Range Status   Specimen Description   Final    SPUTUM Performed at [facility], [address] [place], [place] [address]    Special Requests   Final    Normal Reflexed from [id number] Performed at [facility], [place], [place] [address]    Gram Stain   Final    RARE SQUAMOUS EPITHELIAL CELLS PRESENT NO WBC SEEN RARE GRAM POSITIVE RODS    Culture   Final    FEW Consistent with normal respiratory flora. Performed at [facility], [employer] [employer] [address], [place], [place] [address]    Report Status 11/16/2017 FINAL  Final     Management plans discussed with the patient, family and they are in agreement.  CODE STATUS:     Code Status Orders  (From admission, onward)         Start     Ordered   11/12/17 1802  Full code  Continuous     11/12/17 1802        Code Status History    Date Active Date Inactive Code Status Order ID Comments User Context   04/27/2016 2025 04/28/2016 1954 Full Code [id number]  Demetrios Loll, MD Inpatient   01/15/2016 0358 01/16/2016 1752 Full Code [id number]  Hugelmeyer, Ubaldo Glassing, DO Inpatient      TOTAL TIME TAKING CARE OF THIS PATIENT: 35 minutes.    Loletha Grayer M.D on 11/18/2017 at 2:40 PM  Between 7am to 6pm - Pager - [phone]  After 6pm go to [email or web address] -  password [email or web address]  [facility] Office  [phone]  CC: Primary care physician; Gennette Pac, [place]

## 2017-11-18 NOTE — Care Management (Signed)
Notified by Corene Cornea from [place] that Medicaid will not approve trilogy, due to ABG being drawn in "an unstable state" stating the [facility] was too low.  ABG would have to be repeated.  Unable to draw labs as patient has been discharged, and is currently being wheeled off the department via [place].  MD notified.

## 2017-11-18 NOTE — Progress Notes (Addendum)
Patient ID: Emily [name], female   DOB: 1960/06/16, 57 y.o.   MRN: [id number]  Patient qualifies for noninvasive ventilation at night.  The patient has COPD and now with a diagnosis of chronic respiratory failure wearing oxygen 24/7.  Noninvasive ventilation will help with oxygen entry and blowing off carbon dioxide.  The patient continues to exhibit signs of hypercapnia associated with chronic respiratory failure secondary to severe COPD.  The patient requires the use of noninvasive ventilation at night and while sleeping.  The use of noninvasive ventilation can help reduce risk of exacerbations and future hospitalizations when used at night and during the day as needed.  Patient will need these advanced settings in conjunction with her current medication regimen.  BiPAP is not an option due to its functional limitations and the severity of the patient's condition.  Patient able to maintain airway and clear secretions.Failure to have a noninvasive ventilation available over a 24-hour period could lead to repeated hospitalizations and even death.  Dr. Loletha Grayer

## 2017-11-18 NOTE — Care Management Note (Signed)
Case Management Note  Patient Details  Name: Emily [name] MRN: [id number] Date of Birth: 1960/05/10   Patient to discharge home today.  Patient lives at home with mother.  PCP= [employer].  Pharmacy [facility].  Patient denies issues obtaining medication.  Patient denies issues with transportation, states that her and her mother share a vehicle.  Patient with qualify home O2 sats.  Order for nebulizer, and trilogy. Order for home health RN, and COPD protocol.  Patient agreeable to home health services, states that she does not have a preference of agency. Referral made to [facility] with [place].  Portable O2 and nebulizer delivered prior to discharge.  [facility] is pursing trilogy and awaiting Medicaid auth. RNCM signing off  Subjective/Objective:                    Action/Plan:   Expected Discharge Date:  11/18/17               Expected Discharge Plan:  [place]  In-House Referral:     Discharge planning Services  CM Consult  Post Acute Care Choice:  Durable Medical Equipment, Home Health Choice offered to:  Patient  DME Arranged:  Oxygen, NIV, Nebulizer/meds DME Agency:  [place]:  RN [facility] Agency:  [place]  Status of Service:  Completed, signed off  If discussed at [place] of Stay Meetings, dates discussed:    Additional Comments:  Beverly Sessions, RN 11/18/2017, 3:30 PM

## 2017-11-22 ENCOUNTER — Telehealth: Payer: Self-pay

## 2017-11-22 LAB — ALPHA-1 ANTITRYPSIN PHENOTYPE: A-1 Antitrypsin, Ser: 121 mg/dL (ref 101–187)

## 2017-11-22 NOTE — Telephone Encounter (Signed)
Flagged on EMMI report for not knowing who to call about changes in condition.  Called and spoke with patient.  Reviewed AVS and encouraged her to contact her PCP Raquel Sarna Headrick) if she had any questions or concerns.  No other questions or issues at this time.  I thanked patient for her time and informed her she would receive one more automated call checking in over the next few days.

## 2017-12-01 ENCOUNTER — Other Ambulatory Visit: Payer: Self-pay | Admitting: Internal Medicine

## 2017-12-01 DIAGNOSIS — Z1231 Encounter for screening mammogram for malignant neoplasm of breast: Secondary | ICD-10-CM

## 2017-12-05 ENCOUNTER — Other Ambulatory Visit: Payer: Self-pay | Admitting: Internal Medicine

## 2017-12-30 ENCOUNTER — Ambulatory Visit (INDEPENDENT_AMBULATORY_CARE_PROVIDER_SITE_OTHER): Payer: Medicaid Other | Admitting: Internal Medicine

## 2017-12-30 ENCOUNTER — Encounter: Payer: Self-pay | Admitting: Internal Medicine

## 2017-12-30 VITALS — BP 130/80 | HR 87 | Ht 64.75 in | Wt 218.0 lb

## 2017-12-30 DIAGNOSIS — E1165 Type 2 diabetes mellitus with hyperglycemia: Secondary | ICD-10-CM | POA: Diagnosis not present

## 2017-12-30 DIAGNOSIS — E1159 Type 2 diabetes mellitus with other circulatory complications: Secondary | ICD-10-CM | POA: Diagnosis not present

## 2017-12-30 DIAGNOSIS — E0590 Thyrotoxicosis, unspecified without thyrotoxic crisis or storm: Secondary | ICD-10-CM

## 2017-12-30 DIAGNOSIS — E041 Nontoxic single thyroid nodule: Secondary | ICD-10-CM

## 2017-12-30 DIAGNOSIS — E049 Nontoxic goiter, unspecified: Secondary | ICD-10-CM | POA: Diagnosis not present

## 2017-12-30 DIAGNOSIS — E040 Nontoxic diffuse goiter: Secondary | ICD-10-CM

## 2017-12-30 LAB — POCT GLYCOSYLATED HEMOGLOBIN (HGB A1C): Hemoglobin A1C: 7.1 % — AB (ref 4.0–5.6)

## 2017-12-30 NOTE — Addendum Note (Signed)
Addended by: Cardell Peach I on: 12/30/2017 12:48 PM   Modules accepted: Orders

## 2017-12-30 NOTE — Progress Notes (Signed)
Patient ID: Emily [name], female   DOB: [birth year], 57 y.o.   MRN: [id number]   HPI  Emily [name] is a 57 y.o.-year-old female, returning for f/u for thyrotoxicosis/goiter and DM2, uncontrolled, insulin-dependent, with complications (CKD stage 3, cerebro-vascular ds. - s/p stroke). Last visit 3 months ago.  She had PNA  - was hospitalized for 1 week in 11/2017.  Her kidney function was worse.  She was taken off glipizide and discharged on low-dose NovoLog.  TMNG/Graves ds.: Reviewed and addended history: Pt was dx'ed with hyperthyroidism in ~2014 >> started MMI, lately 10 mg 2x daily. She was lost for f/u at [facility] as she could not travel over there.  Thyroid uptake and scan (06/22/2013) showed: FINDINGS: The 24 hour uptake was 24.7%. The normal reference range at 24 hours is 15 to 35%.   The thyroid scan demonstrates symmetric, mildly heterogenous distribution of radiopharmaceutical throughout the thyroid, which is increased in size.  IMPRESSION: Mildly heterogenous iodine uptake, cannot exclude toxic multinodular goiter versus possible mild Graves disease. Consider ultrasound for further correlation.  Patient was on methimazole before, however, this was stopped after  TSH returned very elevated, in the 90s (02/2015) >> TSH decreased to 8, then normalized >> we continued her off MMI.   However,she was started on MMI 10 mg 2x a day (re-Rx by Dr. Rosanna Randy: 04/17/2016, [facility]) - I do not think he was aware that we had stopped MMI.  We stopped methimazole again in 05/2016.  However, we had to restart again methimazole in 05/2017.  She continues on 5 mg daily.  Reviewed her TFTs: Lab Results  Component Value Date   TSH 0.757 11/14/2017   TSH 1.61 09/27/2017   TSH 0.03 (L) 05/24/2017   TSH 0.54 08/20/2016   TSH 2.840 01/14/2016   TSH 3.20 12/10/2015   TSH 7.49 (H) 11/07/2015   TSH 2.35 07/31/2015   TSH 2.57 06/13/2015   TSH 8.090 (H) 04/09/2015   FREET4 0.71  09/27/2017   FREET4 0.92 05/24/2017   FREET4 1.10 08/20/2016   FREET4 0.47 (L) 12/10/2015   FREET4 0.32 (L) 11/07/2015   FREET4 0.62 07/31/2015   FREET4 0.57 (L) 06/13/2015   FREET4 0.30 (L) 04/09/2015   Previously:   Thyroid U/S (05/12/2016): Large isthmic nodule Thyroid tissue is diffusely heterogeneous and enlarged.  The isthmus has a large isoechoic nodular structure. Unclear if this represents a discrete nodule versus focal heterogeneous thyroid tissue. This area measures up to 3.3 cm and recommend biopsy of this area.  Numerous small nodules and cysts throughout the left and right thyroid lobes. These predominantly cystic nodules do not meet criteria for biopsy or dedicated follow-up.  FNA of the isthmic nodule (06/25/2016): Benign  Pt denies: - feeling nodules in neck - hoarseness - dysphagia - choking - SOB with lying down  Pt does have a FH of thyroid ds.: aunt. No FH of thyroid cancer. No h/o radiation tx to head or neck.  No herbal supplements. No Biotin use. No recent steroids use.   DM2: -Poorly controlled, with history of noncompliance with medicines -+ Complications:: History of stroke, also has CKD  Reviewed HbA1c levels: Lab Results  Component Value Date   HGBA1C 8.6 (A) 09/27/2017   HGBA1C 9.8 05/24/2017   HGBA1C 10.9 02/23/2017   She was on: - Lantus 45 units 2x a day >> was off insulin - "the pharmacy did not receive it" - Januvia 50 mg in am >> stopped (?) - Glipizide 10 mg  2x a day >> started in the hospital We added Jardiance at last visit >> not taking  - "the pharmacy did not receive it" She was on Metformin >> diarrhea.  Now on: - Glipizide 10 mg 2x a day before meals >> Novolog 6 units 2x a day - b'fast and dinner (changed in the hospital [id number]/6659) - Trulicity 1.5 mg weekly - Jardiance 10 mg before breakfast - Lantus 50 units in a.m. and 30 units at bedtime  She is checking sugars 2 times a day: - am:  47, 56, 75-161, 229 >>  105-207, 256, 314 >> 70-136, 147, 167, HI - 2h after b'fast: n/c - lunch: n/c - 2h after lunch: n/c - dinner: n/c >> 80-305, 390 >> n/c - 2h after dinner: n/c - bedtime: 101-275, HI >> 111-HI (196-333) >> 81-188, 209, 310, HI  + CKD; BUN/Cr: Lab Results  Component Value Date   BUN 59 (H) 11/18/2017   Lab Results  Component Value Date   CREATININE 1.92 (H) 11/18/2017  On lisinopril 10 >> 5.  + HL.  Latest lipid profile: Lab Results  Component Value Date   CHOL 193 05/24/2017   HDL 59.50 05/24/2017   LDLCALC 108 (H) 05/24/2017   TRIG 127.0 05/24/2017   CHOLHDL 3 05/24/2017  On Lipitor 40.  Latest dilated eye exam was in 11/2016: No DR  She stopped smoking since last viit.  ROS: Constitutional: + weight gain/no weight loss, no fatigue, no subjective hyperthermia, no subjective hypothermia Eyes: no blurry vision, no xerophthalmia ENT: no sore throat, + see HPI  Cardiovascular: no CP/no SOB/no palpitations/+  leg swelling Respiratory: no cough/no SOB/+ wheezing Gastrointestinal: no N/no V/no D/no C/no acid reflux Musculoskeletal: no muscle aches/no joint aches Skin: no rashes, no hair loss Neurological: no tremors/no numbness/no tingling/no dizziness  I reviewed pt's medications, allergies, PMH, social hx, family hx, and changes were documented in the history of present illness. Otherwise, unchanged from my initial visit note.   Past Medical History:  Diagnosis Date  . Asthma   . COPD (chronic obstructive pulmonary disease) ([place])   . Diabetes mellitus without complication ([place])   . Hyperlipidemia   . Hypertension   . Stroke [facility])    Was told at recent hospital visit she has stroke in past  . Thyroid disease   . Thyroid nodule 04/30/2016   Past Surgical History:  Procedure Laterality Date  . arm surgery Left    Social History   Social History  . Marital Status: Single    Spouse Name: N/A  . Number of Children: 1   Occupational History  . None   Social  History Main Topics  . Smoking status: Current Every Day Smoker -- 1.00 packs/day  . Smokeless tobacco: Not on file  . Alcohol Use: No  . Drug Use: No   Current Outpatient Medications on File Prior to Visit  Medication Sig Dispense Refill  . albuterol (PROVENTIL HFA;VENTOLIN HFA) 108 (90 Base) MCG/ACT inhaler Inhale 1-2 puffs into the lungs every 6 (six) hours as needed for wheezing or shortness of breath. 1 Inhaler 11  . albuterol (PROVENTIL) (2.5 MG/3ML) 0.083% nebulizer solution Take 3 mLs (2.5 mg total) by nebulization every 6 (six) hours as needed for wheezing or shortness of breath. 100 vial 0  . amLODipine (NORVASC) 10 MG tablet Take 10 mg by mouth daily.    Marland Kitchen aspirin EC 81 MG tablet Take 81 mg by mouth daily.    Marland Kitchen atorvastatin (LIPITOR) 40 MG  tablet TAKE ONE TABLET BY MOUTH EVERY DAY (Patient taking differently: Take 40 mg by mouth at bedtime. ) 90 tablet PRN  . budesonide-formoterol (SYMBICORT) 80-4.5 MCG/ACT inhaler Inhale 2 puffs into the lungs 2 (two) times daily. 1 Inhaler 12  . carvedilol (COREG) 25 MG tablet TAKE 1 TABLET BY MOUTH TWICE DAILY (Patient taking differently: Take 25 mg by mouth 2 (two) times daily. ) 180 tablet 1  . cyclobenzaprine (FLEXERIL) 5 MG tablet TAKE ONE TABLET BY MOUTH EVERY DAY AS NEEDED FOR MUSCLE SPASMS (Patient taking differently: Take 5 mg by mouth daily as needed for muscle spasms. ) 30 tablet 0  . Dulaglutide (TRULICITY) 1.5 KD/9.8PJ SOPN Inject 1.5 mg into the skin once a week. (Patient taking differently: Inject 1.5 mg into the skin every Sunday. ) 12 pen 3  . empagliflozin (JARDIANCE) 10 MG TABS tablet Take 10 mg by mouth daily. Before b'fast (Patient taking differently: Take 10 mg by mouth daily before breakfast. ) 30 tablet 11  . insulin aspart (NOVOLOG PENFILL) cartridge Inject 6 Units into the skin 3 (three) times daily with meals. Can substitute any short acting insulin that is covered in a pen 15 mL 0  . Insulin Glargine (LANTUS SOLOSTAR) 100  UNIT/ML Solostar Pen Inject under skin 50 units in am and 30 units at night (Patient taking differently: Inject 30-50 Units into the skin See admin instructions. 50 units every morning and 30 units at bedtime) 10 pen 11  . levofloxacin (LEVAQUIN) 250 MG tablet Take 1 tablet (250 mg total) by mouth daily. 1 tablet 0  . methimazole (TAPAZOLE) 5 MG tablet TAKE 1 TABLET BY MOUTH ONCE DAILY 90 tablet 1  . tiotropium (SPIRIVA) 18 MCG inhalation capsule Place 18 mcg into inhaler and inhale daily.     No current facility-administered medications on file prior to visit.    No Known Allergies Family History  Problem Relation Age of Onset  . Diabetes Mother   . Hypertension Mother   . Diabetes Father   . Hypertension Father   . CAD Father   . Diabetes Sister    PE: BP 130/80   Pulse 87   Ht 5' 4.75" (1.645 m) Comment: measured  Wt 218 lb (98.9 kg)   SpO2 98%   BMI 36.56 kg/m  Body mass index is 36.56 kg/m.  Wt Readings from Last 3 Encounters:  12/30/17 218 lb (98.9 kg)  11/12/17 220 lb (99.8 kg)  09/27/17 216 lb (98 kg)   Constitutional: overweight, in NAD Eyes: PERRLA, EOMI, no exophthalmos ENT: moist mucous membranes, + left> right thyromegaly, no cervical lymphadenopathy Cardiovascular: RRR, No MRG, + bilateral pitting edema Respiratory: CTA B Gastrointestinal: abdomen soft, NT, ND, BS+ Musculoskeletal: no deformities, strength intact in all 4 Skin: moist, warm, no rashes Neurological: no tremor with outstretched hands, DTR normal in all 4  ASSESSMENT: 1. Thyrotoxicosis  2. Large goiter US SOFT TISSUE HEAD AND NECK  Order: [id number]  Status:  Final result Visible to patient:  No (Not Released) Dx:  Goiter diffuse  Details   Reading Physician Reading Date Result Priority  Markus Daft, MD 05/12/2016   Narrative    CLINICAL DATA: Thyromegaly on previous chest CT.  EXAM: THYROID ULTRASOUND  TECHNIQUE: Ultrasound examination of the thyroid gland and adjacent  soft tissues was performed.  COMPARISON: Chest CT 01/14/2016  FINDINGS: Parenchymal Echotexture: Moderately heterogenous  Isthmus: 1.2 cm in the AP dimension  Right lobe: 8.4 x 2.8 x 4.5 cm  Left lobe:  8.2 x 3.0 x 3.7 cm  _________________________________________________________  Estimated total number of nodules >/= 1 cm: 6-10  Number of spongiform nodules >/= 2 cm not described below (TR1): 0  Number of mixed cystic and solid nodules >/= 1.5 cm not described below ([place]): 0  _________________________________________________________  Nodule # 1:  Location: Isthmus; Inferior  Maximum size: 3.3 cm; Other 2 dimensions: 1.7 x 2.7 cm  Composition: solid/almost completely solid (2)  Echogenicity: isoechoic (1)  **Given size (>/= 2.5 cm) and appearance, fine needle aspiration of this mildly suspicious nodule should be considered based on TI-RADS criteria.  _________________________________________________________  Mildly complex cystic structure in the superior right thyroid lobe measuring up to 1.0 cm. Minimally complex cyst in the superior right thyroid lobe measuring up to 1.6 cm. Adjacent complex cysts or spongiform nodules in the mid right thyroid lobe, largest measuring 1.1 cm. Spongiform nodule in the mid left thyroid lobe measuring up to 1.3 cm.  Nodule # 2:  Location: Left; Mid  Maximum size: 2.0 cm; Other 2 dimensions: 0.8 x 0.8 cm  Composition: mixed cystic and solid (1)  Echogenicity: cannot determine (1)  This nodule does NOT meet TI-RADS criteria for biopsy or dedicated follow-up.  _________________________________________________________  IMPRESSION: Thyroid tissue is diffusely heterogeneous and enlarged.  The isthmus has a large isoechoic nodular structure. Unclear if this represents a discrete nodule versus focal heterogeneous thyroid tissue. This area measures up to 3.3 cm and recommend biopsy of this area.  Numerous small nodules  and cysts throughout the left and right thyroid lobes. These predominantly cystic nodules do not meet criteria for biopsy or dedicated follow-up.  The above is in keeping with the ACR TI-RADS recommendations - J Am Coll Radiol 2017;14:587-595.   Electronically Signed By: Markus Daft M.D. On: 05/12/2016 12:03        Addendum: Adequacy Reason Satisfactory For Evaluation. Diagnosis THYROID, FINE NEEDLE ASPIRATION ISTHMUS INFERIOR (SPECIMEN 1 OF 1, COLLECTED ON 06/25/2016): CONSISTENT WITH BENIGN FOLLICULAR NODULE ([place] CATEGORY II). Casimer Lanius MD Pathologist, Electronic Signature (Case signed 06/26/2016) Specimen Clinical Information Nodule 1 Isthmus Inferior 3.3 cm; Other 2 dimensions: 1.7 x 2.7 cm, Solid / almost completely solid, Isoechoic, ACR TI-RADS total points: 3, Midly suspicious nodule Source Thyroid, Fine Needle Aspiration, Isthmus Inferior, (Specimen 1 of 1, colected on 06/25/16 )  3. DM2, insulin dependent, uncontrolled  PLAN:  1. And 2.  -She has a history of thyrotoxicosis per Thyroid uptake and scan, either due to mild Graves' disease or mild multinodular goiter -She appears euthyroid -She is on methimazole 5 mg daily.   - TSH was normal at last check, last mo, will not repeat this today - will recheck at next OV  2. Large goiter -She has minimal neck compression symptoms, not very bothersome -Reviewed the latest thyroid ultrasound report and biopsy results.  The dominant nodule is 3.3 cm and benign. -We will continue to monitor her clinically  3. DM2 -Patient with longstanding, uncontrolled, type 2 diabetes, on long-acting insulin, GLP-1 receptor agonist, SGLT 2 inhibitor added at last visit, and rapid acting insulin added 1.5 months ago.  We started long-acting insulin at the end of 2018 and then we added a GLP-1 receptor agonist, with improved sugars initially but worsening afterwards.  She was having much higher post dinner sugars with subsequent  high a.m. sugars at last visit and I advised her to limit snacking at night but we also started a low dose SGLT 2 inhibitor (due to her CKD). At this  visit, reviewing her logs, her sugars significantly improved after her pneumonia hospitalization in 11/2017.  At that time, she was taken off of glipizide and started on NovoLog 6 units before breakfast and dinner.  She continues on this, -For now, we discussed about continuing NovoLog since sugars are much better compared to before.  However, she is taking NovoLog without the relationship with her meals and I advised her to move these doses approximately 15 minutes before breakfast and dinner - today, HbA1c is 7.1% (much better) - I advised her to: Patient Instructions  Please continue: - Trulicity 1.5 mg weekly - Jardiance 10 mg before breakfast - Lantus 50 units in a.m. and 30 units at bedtime  Please move: - Novolog 6 units 15 min before b'fast and dinner. Do not take this if you skip a meal.  Please continue: - Methimazole 5 mg daily  Please return in 3 months with your sugar log.  - continue checking sugars at different times of the day - check 2x a day, rotating checks - advised for yearly eye exams >> she is not UTD - Return to clinic in 3 mo with sugar log      CC: PCP: Gennette Pac, [place]    Philemon Kingdom, MD PhD [facility] Endocrinology

## 2017-12-30 NOTE — Patient Instructions (Addendum)
Please continue: - Trulicity 1.5 mg weekly - Jardiance 10 mg before breakfast - Lantus 50 units in a.m. and 30 units at bedtime  Please move: - Novolog 6 units 15 min before b'fast and dinner. Do not take this if you skip a meal.  Please continue: - Methimazole 5 mg daily  Please return in 3 months with your sugar log.

## 2018-01-06 ENCOUNTER — Ambulatory Visit: Payer: Medicaid Other | Attending: Neurology

## 2018-01-06 DIAGNOSIS — G4733 Obstructive sleep apnea (adult) (pediatric): Secondary | ICD-10-CM | POA: Diagnosis present

## 2018-01-07 ENCOUNTER — Encounter: Payer: Self-pay | Admitting: Internal Medicine

## 2018-01-07 LAB — HM DIABETES EYE EXAM

## 2018-01-14 ENCOUNTER — Ambulatory Visit
Admission: RE | Admit: 2018-01-14 | Discharge: 2018-01-14 | Disposition: A | Discharge status: Home / Self Care | Payer: Medicaid Other | Source: Ambulatory Visit | Attending: Internal Medicine | Admitting: Internal Medicine

## 2018-01-14 DIAGNOSIS — Z1231 Encounter for screening mammogram for malignant neoplasm of breast: Secondary | ICD-10-CM | POA: Diagnosis present

## 2018-02-12 ENCOUNTER — Encounter: Payer: Self-pay | Admitting: Emergency Medicine

## 2018-02-12 ENCOUNTER — Other Ambulatory Visit: Payer: Self-pay

## 2018-02-12 ENCOUNTER — Emergency Department: Payer: Medicaid Other

## 2018-02-12 ENCOUNTER — Inpatient Hospital Stay
Admission: EM | Admit: 2018-02-12 | Discharge: 2018-02-18 | DRG: 291 | Disposition: A | Discharge status: Home Health | Payer: Medicaid Other | Attending: Internal Medicine | Admitting: Internal Medicine

## 2018-02-12 DIAGNOSIS — Z8673 Personal history of transient ischemic attack (TIA), and cerebral infarction without residual deficits: Secondary | ICD-10-CM

## 2018-02-12 DIAGNOSIS — J189 Pneumonia, unspecified organism: Secondary | ICD-10-CM | POA: Diagnosis present

## 2018-02-12 DIAGNOSIS — I5033 Acute on chronic diastolic (congestive) heart failure: Secondary | ICD-10-CM | POA: Diagnosis present

## 2018-02-12 DIAGNOSIS — E039 Hypothyroidism, unspecified: Secondary | ICD-10-CM | POA: Diagnosis present

## 2018-02-12 DIAGNOSIS — J9601 Acute respiratory failure with hypoxia: Secondary | ICD-10-CM | POA: Diagnosis present

## 2018-02-12 DIAGNOSIS — J440 Chronic obstructive pulmonary disease with acute lower respiratory infection: Secondary | ICD-10-CM | POA: Diagnosis present

## 2018-02-12 DIAGNOSIS — Z7982 Long term (current) use of aspirin: Secondary | ICD-10-CM

## 2018-02-12 DIAGNOSIS — E1122 Type 2 diabetes mellitus with diabetic chronic kidney disease: Secondary | ICD-10-CM | POA: Diagnosis present

## 2018-02-12 DIAGNOSIS — E1165 Type 2 diabetes mellitus with hyperglycemia: Secondary | ICD-10-CM | POA: Diagnosis present

## 2018-02-12 DIAGNOSIS — G4730 Sleep apnea, unspecified: Secondary | ICD-10-CM | POA: Diagnosis present

## 2018-02-12 DIAGNOSIS — Z79899 Other long term (current) drug therapy: Secondary | ICD-10-CM

## 2018-02-12 DIAGNOSIS — Z794 Long term (current) use of insulin: Secondary | ICD-10-CM

## 2018-02-12 DIAGNOSIS — J810 Acute pulmonary edema: Secondary | ICD-10-CM | POA: Diagnosis present

## 2018-02-12 DIAGNOSIS — Z9049 Acquired absence of other specified parts of digestive tract: Secondary | ICD-10-CM | POA: Diagnosis not present

## 2018-02-12 DIAGNOSIS — Z9981 Dependence on supplemental oxygen: Secondary | ICD-10-CM | POA: Diagnosis not present

## 2018-02-12 DIAGNOSIS — J441 Chronic obstructive pulmonary disease with (acute) exacerbation: Secondary | ICD-10-CM | POA: Diagnosis present

## 2018-02-12 DIAGNOSIS — N184 Chronic kidney disease, stage 4 (severe): Secondary | ICD-10-CM | POA: Diagnosis present

## 2018-02-12 DIAGNOSIS — I509 Heart failure, unspecified: Secondary | ICD-10-CM

## 2018-02-12 DIAGNOSIS — I5032 Chronic diastolic (congestive) heart failure: Secondary | ICD-10-CM

## 2018-02-12 DIAGNOSIS — I130 Hypertensive heart and chronic kidney disease with heart failure and stage 1 through stage 4 chronic kidney disease, or unspecified chronic kidney disease: Secondary | ICD-10-CM | POA: Diagnosis not present

## 2018-02-12 DIAGNOSIS — E785 Hyperlipidemia, unspecified: Secondary | ICD-10-CM | POA: Diagnosis present

## 2018-02-12 DIAGNOSIS — Z833 Family history of diabetes mellitus: Secondary | ICD-10-CM

## 2018-02-12 DIAGNOSIS — L03116 Cellulitis of left lower limb: Secondary | ICD-10-CM | POA: Diagnosis present

## 2018-02-12 DIAGNOSIS — Z87891 Personal history of nicotine dependence: Secondary | ICD-10-CM | POA: Diagnosis not present

## 2018-02-12 DIAGNOSIS — T380X5A Adverse effect of glucocorticoids and synthetic analogues, initial encounter: Secondary | ICD-10-CM | POA: Diagnosis present

## 2018-02-12 DIAGNOSIS — Z7951 Long term (current) use of inhaled steroids: Secondary | ICD-10-CM | POA: Diagnosis not present

## 2018-02-12 DIAGNOSIS — R0602 Shortness of breath: Secondary | ICD-10-CM | POA: Diagnosis not present

## 2018-02-12 DIAGNOSIS — Z8249 Family history of ischemic heart disease and other diseases of the circulatory system: Secondary | ICD-10-CM

## 2018-02-12 LAB — CBC WITH DIFFERENTIAL/PLATELET
ABS IMMATURE GRANULOCYTES: 0.02 10*3/uL (ref 0.00–0.07)
BASOS PCT: 0 %
Basophils Absolute: 0 10*3/uL (ref 0.0–0.1)
EOS PCT: 1 %
Eosinophils Absolute: 0.1 10*3/uL (ref 0.0–0.5)
HCT: 52.9 % — ABNORMAL HIGH (ref 36.0–46.0)
Hemoglobin: 15.5 g/dL — ABNORMAL HIGH (ref 12.0–15.0)
Immature Granulocytes: 0 %
Lymphocytes Relative: 27 %
Lymphs Abs: 1.8 10*3/uL (ref 0.7–4.0)
MCH: 28.2 pg (ref 26.0–34.0)
MCHC: 29.3 g/dL — AB (ref 30.0–36.0)
MCV: 96.4 fL (ref 80.0–100.0)
Monocytes Absolute: 0.6 10*3/uL (ref 0.1–1.0)
Monocytes Relative: 9 %
NEUTROS ABS: 4.1 10*3/uL (ref 1.7–7.7)
Neutrophils Relative %: 63 %
PLATELETS: 205 10*3/uL (ref 150–400)
RBC: 5.49 MIL/uL — AB (ref 3.87–5.11)
RDW: 13.4 % (ref 11.5–15.5)
WBC: 6.5 10*3/uL (ref 4.0–10.5)
nRBC: 0.8 % — ABNORMAL HIGH (ref 0.0–0.2)

## 2018-02-12 LAB — BASIC METABOLIC PANEL
Anion gap: 6 (ref 5–15)
BUN: 27 mg/dL — ABNORMAL HIGH (ref 6–20)
CALCIUM: 8.3 mg/dL — AB (ref 8.9–10.3)
CO2: 30 mmol/L (ref 22–32)
CREATININE: 2.47 mg/dL — AB (ref 0.44–1.00)
Chloride: 104 mmol/L (ref 98–111)
GFR calc Af Amer: 24 mL/min — ABNORMAL LOW (ref >60)
GFR, EST NON AFRICAN AMERICAN: 21 mL/min — AB (ref >60)
Glucose, Bld: 51 mg/dL — ABNORMAL LOW (ref 70–99)
Potassium: 4.1 mmol/L (ref 3.5–5.1)
SODIUM: 140 mmol/L (ref 135–145)

## 2018-02-12 LAB — GLUCOSE, CAPILLARY: Glucose-Capillary: 147 mg/dL — ABNORMAL HIGH (ref 70–99)

## 2018-02-12 LAB — TROPONIN I: TROPONIN I: 0.05 ng/mL — AB (ref <0.03)

## 2018-02-12 LAB — BRAIN NATRIURETIC PEPTIDE: B Natriuretic Peptide: 663 pg/mL — ABNORMAL HIGH (ref 0.0–100.0)

## 2018-02-12 MED ORDER — ONDANSETRON HCL 4 MG/2ML IJ SOLN: 4.0000 mg | Freq: Four times a day (QID) | INTRAMUSCULAR | Status: DC | PRN

## 2018-02-12 MED ORDER — INSULIN ASPART 100 UNIT/ML CARTRIDGE (PENFILL): 6.0000 [IU] | Freq: Three times a day (TID) | SUBCUTANEOUS | Status: DC

## 2018-02-12 MED ORDER — ACETAMINOPHEN 650 MG RE SUPP: 650.0000 mg | Freq: Four times a day (QID) | RECTAL | Status: DC | PRN

## 2018-02-12 MED ORDER — HEPARIN SODIUM (PORCINE) 5000 UNIT/ML IJ SOLN
5000.0000 [IU] | Freq: Three times a day (TID) | INTRAMUSCULAR | Status: DC
  Administered 2018-02-13 – 2018-02-18 (×16): 5000 [IU] via SUBCUTANEOUS
  Filled 2018-02-12 (×16): qty 1

## 2018-02-12 MED ORDER — METHYLPREDNISOLONE SODIUM SUCC 125 MG IJ SOLR
60.0000 mg | INTRAMUSCULAR | Status: DC
  Administered 2018-02-12: 60 mg via INTRAVENOUS
  Filled 2018-02-12: qty 2

## 2018-02-12 MED ORDER — MOMETASONE FURO-FORMOTEROL FUM 100-5 MCG/ACT IN AERO
2.0000 | INHALATION_SPRAY | Freq: Two times a day (BID) | RESPIRATORY_TRACT | Status: DC
  Administered 2018-02-12 – 2018-02-13 (×2): 2 via RESPIRATORY_TRACT
  Filled 2018-02-12: qty 8.8

## 2018-02-12 MED ORDER — IPRATROPIUM-ALBUTEROL 0.5-2.5 (3) MG/3ML IN SOLN
3.0000 mL | Freq: Once | RESPIRATORY_TRACT | Status: AC
  Administered 2018-02-12: 3 mL via RESPIRATORY_TRACT
  Filled 2018-02-12: qty 3

## 2018-02-12 MED ORDER — METHYLPREDNISOLONE SODIUM SUCC 125 MG IJ SOLR
125.0000 mg | Freq: Once | INTRAMUSCULAR | Status: AC
  Administered 2018-02-12: 125 mg via INTRAVENOUS
  Filled 2018-02-12: qty 2

## 2018-02-12 MED ORDER — FUROSEMIDE 10 MG/ML IJ SOLN
20.0000 mg | Freq: Once | INTRAMUSCULAR | Status: AC
  Administered 2018-02-12: 20 mg via INTRAVENOUS
  Filled 2018-02-12: qty 4

## 2018-02-12 MED ORDER — DULAGLUTIDE 1.5 MG/0.5ML ~~LOC~~ SOAJ
1.5000 mg | SUBCUTANEOUS | Status: DC
  Filled 2018-02-12: qty 1

## 2018-02-12 MED ORDER — CANAGLIFLOZIN 100 MG PO TABS: 100.0000 mg | ORAL_TABLET | Freq: Every day | ORAL | Status: DC

## 2018-02-12 MED ORDER — FUROSEMIDE 10 MG/ML IJ SOLN: 40.0000 mg | Freq: Two times a day (BID) | INTRAMUSCULAR | Status: DC

## 2018-02-12 MED ORDER — CEPHALEXIN 250 MG PO CAPS
250.0000 mg | ORAL_CAPSULE | Freq: Three times a day (TID) | ORAL | Status: DC
  Administered 2018-02-13 – 2018-02-16 (×11): 250 mg via ORAL
  Filled 2018-02-12 (×12): qty 1

## 2018-02-12 MED ORDER — METHIMAZOLE 5 MG PO TABS
5.0000 mg | ORAL_TABLET | Freq: Every day | ORAL | Status: DC
  Administered 2018-02-13 – 2018-02-18 (×6): 5 mg via ORAL
  Filled 2018-02-12 (×7): qty 1

## 2018-02-12 MED ORDER — CARVEDILOL 25 MG PO TABS
25.0000 mg | ORAL_TABLET | Freq: Two times a day (BID) | ORAL | Status: DC
  Administered 2018-02-12 – 2018-02-18 (×12): 25 mg via ORAL
  Filled 2018-02-12 (×12): qty 1

## 2018-02-12 MED ORDER — IPRATROPIUM-ALBUTEROL 0.5-2.5 (3) MG/3ML IN SOLN
3.0000 mL | RESPIRATORY_TRACT | Status: DC
  Administered 2018-02-13 (×3): 3 mL via RESPIRATORY_TRACT
  Filled 2018-02-12 (×3): qty 3

## 2018-02-12 MED ORDER — ATORVASTATIN CALCIUM 20 MG PO TABS
40.0000 mg | ORAL_TABLET | Freq: Every day | ORAL | Status: DC
  Administered 2018-02-12 – 2018-02-17 (×6): 40 mg via ORAL
  Filled 2018-02-12 (×6): qty 2

## 2018-02-12 MED ORDER — INSULIN ASPART 100 UNIT/ML ~~LOC~~ SOLN
6.0000 [IU] | Freq: Three times a day (TID) | SUBCUTANEOUS | Status: DC
  Administered 2018-02-13 – 2018-02-18 (×17): 6 [IU] via SUBCUTANEOUS
  Filled 2018-02-12 (×17): qty 1

## 2018-02-12 MED ORDER — INSULIN ASPART 100 UNIT/ML ~~LOC~~ SOLN
0.0000 [IU] | Freq: Three times a day (TID) | SUBCUTANEOUS | Status: DC
  Administered 2018-02-13: 7 [IU] via SUBCUTANEOUS
  Administered 2018-02-13: 5 [IU] via SUBCUTANEOUS
  Administered 2018-02-13 – 2018-02-14 (×2): 7 [IU] via SUBCUTANEOUS
  Administered 2018-02-14: 5 [IU] via SUBCUTANEOUS
  Administered 2018-02-14: 7 [IU] via SUBCUTANEOUS
  Administered 2018-02-15 – 2018-02-16 (×3): 9 [IU] via SUBCUTANEOUS
  Administered 2018-02-16 – 2018-02-17 (×3): 7 [IU] via SUBCUTANEOUS
  Administered 2018-02-17: 9 [IU] via SUBCUTANEOUS
  Administered 2018-02-18: 3 [IU] via SUBCUTANEOUS
  Administered 2018-02-18: 7 [IU] via SUBCUTANEOUS
  Filled 2018-02-12 (×15): qty 1

## 2018-02-12 MED ORDER — ACETAMINOPHEN 325 MG PO TABS: 650.0000 mg | ORAL_TABLET | Freq: Four times a day (QID) | ORAL | Status: DC | PRN

## 2018-02-12 MED ORDER — ONDANSETRON HCL 4 MG PO TABS: 4.0000 mg | ORAL_TABLET | Freq: Four times a day (QID) | ORAL | Status: DC | PRN

## 2018-02-12 MED ORDER — INSULIN ASPART 100 UNIT/ML ~~LOC~~ SOLN
0.0000 [IU] | Freq: Every day | SUBCUTANEOUS | Status: DC
  Administered 2018-02-13: 4 [IU] via SUBCUTANEOUS
  Administered 2018-02-14 – 2018-02-15 (×2): 5 [IU] via SUBCUTANEOUS
  Administered 2018-02-16 – 2018-02-17 (×2): 4 [IU] via SUBCUTANEOUS
  Filled 2018-02-12 (×5): qty 1

## 2018-02-12 MED ORDER — ASPIRIN EC 81 MG PO TBEC
81.0000 mg | DELAYED_RELEASE_TABLET | Freq: Every day | ORAL | Status: DC
  Administered 2018-02-13 – 2018-02-18 (×6): 81 mg via ORAL
  Filled 2018-02-12 (×6): qty 1

## 2018-02-12 MED ORDER — DOCUSATE SODIUM 100 MG PO CAPS
100.0000 mg | ORAL_CAPSULE | Freq: Two times a day (BID) | ORAL | Status: DC
  Administered 2018-02-13 – 2018-02-18 (×11): 100 mg via ORAL
  Filled 2018-02-12 (×11): qty 1

## 2018-02-12 MED ORDER — CEPHALEXIN 500 MG PO CAPS: 500.0000 mg | ORAL_CAPSULE | Freq: Three times a day (TID) | ORAL | Status: DC

## 2018-02-12 MED ORDER — FUROSEMIDE 10 MG/ML IJ SOLN
40.0000 mg | Freq: Two times a day (BID) | INTRAMUSCULAR | Status: DC
  Administered 2018-02-13 – 2018-02-18 (×11): 40 mg via INTRAVENOUS
  Filled 2018-02-12 (×11): qty 4

## 2018-02-12 MED ORDER — POLYETHYLENE GLYCOL 3350 17 G PO PACK
17.0000 g | PACK | Freq: Every day | ORAL | Status: DC | PRN
  Administered 2018-02-14 – 2018-02-15 (×2): 17 g via ORAL
  Filled 2018-02-12 (×2): qty 1

## 2018-02-12 MED ORDER — AMLODIPINE BESYLATE 10 MG PO TABS
10.0000 mg | ORAL_TABLET | Freq: Every day | ORAL | Status: DC
  Administered 2018-02-13 – 2018-02-18 (×6): 10 mg via ORAL
  Filled 2018-02-12 (×6): qty 1

## 2018-02-12 NOTE — ED Notes (Signed)
Attempted to call report. Nurse to call back when out of pt room.

## 2018-02-12 NOTE — H&P (Signed)
[facility] NAME: Emily [name]    MR#:  [id number]  DATE OF BIRTH:  May 29, 1960  DATE OF ADMISSION:  02/12/2018  PRIMARY CARE PHYSICIAN: Gennette Pac, FNP   REQUESTING/REFERRING PHYSICIAN: Dr. Rudene Re  CHIEF COMPLAINT:   Chief Complaint  Patient presents with  . Shortness of Breath    HISTORY OF PRESENT ILLNESS:  Emily [name]  is a 58 y.o. female with a known history of COPD on 2 L home oxygen, hypertension, hyperlipidemia, CKD stage IV, obesity, insulin-dependent diabetes mellitus, hypothyroidism presents to hospital secondary to worsening shortness of breath over the past week.  She was discharged from the hospital 3 months ago for COPD exacerbation and was placed on 2 to 3 L oxygen at discharge.  Patient says she was doing well and was told by her PCP recently that she can use it as needed.  She had upper respiratory infection with cough and has been using over-the-counter cough medicines last week with significant improvement.  Denies any fevers, complains of chills.  Has been having 2-3 pillow orthopnea in the last week.  Has 1+ lower extremity edema which according to her is chronic.  No nausea or vomiting.  No diarrhea.  Denies any chest pain.  Labs in ED shows elevated BNP  and chest x-ray with cardiomegaly and pulmonary venous congestion.  Also has COPD exacerbation with significant wheezing on exam.  Currently requiring 4 L O2 acutely.  PAST MEDICAL HISTORY:   Past Medical History:  Diagnosis Date  . Asthma   . COPD (chronic obstructive pulmonary disease) (HCC)    on 2L home o2  . Diabetes mellitus without complication ([place])   . Hyperlipidemia   . Hypertension   . Stroke [facility])    Was told at recent hospital visit she has stroke in past  . Thyroid disease   . Thyroid nodule 04/30/2016    PAST SURGICAL HISTORY:   Past Surgical History:  Procedure Laterality Date  . arm surgery Left      SOCIAL HISTORY:   Social History   Tobacco Use  . Smoking status: Former Smoker    Packs/day: 1.50    Types: Cigarettes    Last attempt to quit: 01/13/2016    Years since quitting: 2.0  . Smokeless tobacco: Never Used  Substance Use Topics  . Alcohol use: No    FAMILY HISTORY:   Family History  Problem Relation Age of Onset  . Diabetes Mother   . Hypertension Mother   . Diabetes Father   . Hypertension Father   . CAD Father   . Diabetes Sister     DRUG ALLERGIES:  No Known Allergies  REVIEW OF SYSTEMS:   Review of Systems  Constitutional: Positive for malaise/fatigue. Negative for chills, fever and weight loss.  HENT: Negative for ear discharge, ear pain, hearing loss, nosebleeds and tinnitus.   Eyes: Negative for blurred vision, double vision and photophobia.  Respiratory: Positive for cough and shortness of breath. Negative for hemoptysis and wheezing.   Cardiovascular: Positive for orthopnea. Negative for chest pain, palpitations and leg swelling.  Gastrointestinal: Negative for abdominal pain, constipation, diarrhea, heartburn, melena, nausea and vomiting.  Genitourinary: Negative for dysuria, frequency, hematuria and urgency.  Musculoskeletal: Negative for back pain, myalgias and neck pain.  Skin: Negative for rash.  Neurological: Negative for dizziness, tingling, tremors, sensory change, speech change, focal weakness and headaches.  Endo/Heme/Allergies: Does not bruise/bleed easily.  Psychiatric/Behavioral: Negative for  depression.    MEDICATIONS AT HOME:   Prior to Admission medications   Medication Sig Start Date End Date Taking? Authorizing Provider  albuterol (PROVENTIL HFA;VENTOLIN HFA) 108 (90 Base) MCG/ACT inhaler Inhale 1-2 puffs into the lungs every 6 (six) hours as needed for wheezing or shortness of breath. 07/16/16   Doles-Johnson, Teah, NP  albuterol (PROVENTIL) (2.5 MG/3ML) 0.083% nebulizer solution Take 3 mLs (2.5 mg total) by nebulization  every 6 (six) hours as needed for wheezing or shortness of breath. 11/18/17   Loletha Grayer, MD  amLODipine (NORVASC) 10 MG tablet Take 10 mg by mouth daily.    [provider]  aspirin EC 81 MG tablet Take 81 mg by mouth daily.    [provider]  atorvastatin (LIPITOR) 40 MG tablet TAKE ONE TABLET BY MOUTH EVERY DAY Patient taking differently: Take 40 mg by mouth at bedtime.  04/21/16   Tawni Millers, MD  budesonide-formoterol (SYMBICORT) 80-4.5 MCG/ACT inhaler Inhale 2 puffs into the lungs 2 (two) times daily. 11/18/17   Loletha Grayer, MD  carvedilol (COREG) 25 MG tablet TAKE 1 TABLET BY MOUTH TWICE DAILY Patient taking differently: Take 25 mg by mouth 2 (two) times daily.  10/14/17   Philemon Kingdom, MD  cyclobenzaprine (FLEXERIL) 5 MG tablet TAKE ONE TABLET BY MOUTH EVERY DAY AS NEEDED FOR MUSCLE SPASMS Patient taking differently: Take 5 mg by mouth daily as needed for muscle spasms.  04/20/16   Doles-Johnson, Teah, NP  Dulaglutide (TRULICITY) 1.5 GY/6.5LD SOPN Inject 1.5 mg into the skin once a week. Patient taking differently: Inject 1.5 mg into the skin every Sunday.  09/27/17   Philemon Kingdom, MD  empagliflozin (JARDIANCE) 10 MG TABS tablet Take 10 mg by mouth daily. Before b'fast Patient taking differently: Take 10 mg by mouth daily before breakfast.  09/27/17   Philemon Kingdom, MD  insulin aspart (NOVOLOG PENFILL) cartridge Inject 6 Units into the skin 3 (three) times daily with meals. Can substitute any short acting insulin that is covered in a pen 11/18/17   Loletha Grayer, MD  Insulin Glargine (LANTUS SOLOSTAR) 100 UNIT/ML Solostar Pen Inject under skin 50 units in am and 30 units at night Patient taking differently: Inject 30-50 Units into the skin See admin instructions. 50 units every morning and 30 units at bedtime 05/24/17   Philemon Kingdom, MD  levofloxacin (LEVAQUIN) 250 MG tablet Take 1 tablet (250 mg total) by mouth daily. 11/20/17   Loletha Grayer, MD  methimazole (TAPAZOLE) 5 MG tablet TAKE 1 TABLET BY MOUTH ONCE DAILY 12/06/17   Philemon Kingdom, MD  tiotropium (SPIRIVA) 18 MCG inhalation capsule Place 18 mcg into inhaler and inhale daily.    [provider]      VITAL SIGNS:  Blood pressure 131/80, pulse 82, temperature 98.1 F (36.7 C), temperature source Oral, resp. rate (!) 25, height 5\' 4"  (1.626 m), weight 102.1 kg, SpO2 92 %.  PHYSICAL EXAMINATION:   Physical Exam  GENERAL:  58 y.o.-year-old obese patient lying in the bed with no acute distress.  EYES: Pupils equal, round, reactive to light and accommodation. No scleral icterus. Extraocular muscles intact.  HEENT: Head atraumatic, normocephalic. Oropharynx and nasopharynx clear.  NECK:  Supple, no jugular venous distention. No thyroid enlargement, no tenderness.  LUNGS: Normal breath sounds bilaterally, has bibasilar crackles, no wheezing rhonchi or crepitation. No use of accessory muscles of respiration.  CARDIOVASCULAR: S1, S2 normal. No murmurs, rubs, or gallops.  ABDOMEN: Soft, nontender, nondistended. Bowel sounds present.  No organomegaly or mass.  EXTREMITIES: No  cyanosis, or clubbing.  1+ bilateral lower extremity edema with pitting.  Left lower extremity with some erythema, tenderness and skin breakdown noted. NEUROLOGIC: Cranial nerves II through XII are intact. Muscle strength 5/5 in all extremities. Sensation intact. Gait not checked.  PSYCHIATRIC: The patient is alert and oriented x 3.  SKIN: No obvious rash, lesion, or ulcer.   LABORATORY PANEL:   CBC Recent Labs  Lab 02/12/18 1714  WBC 6.5  HGB 15.5*  HCT 52.9*  PLT 205   ------------------------------------------------------------------------------------------------------------------  Chemistries  Recent Labs  Lab 02/12/18 1714  NA 140  K 4.1  CL 104  CO2 30  GLUCOSE 51*  BUN 27*  CREATININE 2.47*  CALCIUM 8.3*    ------------------------------------------------------------------------------------------------------------------  Cardiac Enzymes Recent Labs  Lab 02/12/18 1714  TROPONINI 0.05*   ------------------------------------------------------------------------------------------------------------------  RADIOLOGY:  Dg Chest Portable 1 View  Result Date: 02/12/2018 CLINICAL DATA:  57 year old female with history of difficulty breathing today. EXAM: PORTABLE CHEST 1 VIEW COMPARISON:  Chest x-ray 11/12/2017. FINDINGS: Lung volumes are low. No consolidative airspace disease. No pleural effusions. No pneumothorax. No definite suspicious appearing pulmonary nodules or masses are noted. Cephalization of the pulmonary vasculature, without frank pulmonary edema. Mild cardiomegaly. Upper mediastinal contours are within normal limits. IMPRESSION: 1. Cardiomegaly with pulmonary venous congestion, but no frank pulmonary edema. Electronically Signed   By: Vinnie Langton M.D.   On: 02/12/2018 17:30    EKG:   Orders placed or performed during the hospital encounter of 02/12/18  . EKG 12-Lead  . EKG 12-Lead    IMPRESSION AND PLAN:   Emily [name]  is a 58 y.o. female with a known history of COPD on 2 L home oxygen, hypertension, hyperlipidemia, obesity, insulin-dependent diabetes mellitus, hypothyroidism presents to hospital secondary to worsening shortness of breath over the past week.  1.  Acute on chronic COPD exacerbation-recent upper respiratory infection.  Quit smoking about 2 years ago -Admit for IV steroids, duo nebs and inhalers -No evidence of any infection noted.  2.  CHF exacerbation-likely acute diastolic.  Last echo with normal EF. -Check echocardiogram.  Started on Lasix -Strict input and output monitoring.  Daily weights.  3.  Left lower extremity cellulitis-started with skin breakdown and lower extremity edema.  No fevers.  Normal WBC.  Tender and erythematous.  Started on oral  Keflex  4.  CKD stage IV-monitor while on Lasix.  Avoid nephrotoxins  5.  Diabetes mellitus-continue home medications.  Also on sliding scale insulin  6.  Hypertension-on Coreg, Norvasc.  Lasix is added so monitor closely  7.  DVT prophylaxis-subcutaneous heparin   Independent at baseline.  Please encourage ambulation while in the hospital   All the records are reviewed and case discussed with ED provider. Management plans discussed with the patient, family and they are in agreement.  CODE STATUS: Full Code  TOTAL TIME TAKING CARE OF THIS PATIENT: 51 minutes.    Gladstone Lighter M.D on 02/12/2018 at 6:32 PM  Between 7am to 6pm - Pager - [phone]  After 6pm go to [email or web address] - password [email or web address] [facility]  Office  [phone]  CC: Primary care physician; Gennette Pac, [place]

## 2018-02-12 NOTE — ED Notes (Signed)
ED TO INPATIENT HANDOFF REPORT  Name/Age/Gender Emily [name] 58 y.o. female  Code Status Code Status History    Date Active Date Inactive Code Status Order ID Comments User Context   11/12/2017 1802 11/18/2017 2140 Full Code [id number]  Loletha Grayer, MD ED   04/27/2016 2025 04/28/2016 1954 Full Code [id number]  Demetrios Loll, MD Inpatient   01/15/2016 0358 01/16/2016 1752 Full Code [id number]  Hugelmeyer, Ubaldo Glassing, DO Inpatient      Home/SNF/Other home  Chief Complaint SOB  Level of Care/Admitting Diagnosis ED Disposition    ED Disposition Condition [place]: [place] [[id number]]  Level of Care: Telemetry [5]  Diagnosis: CHF (congestive heart failure) [facility]) [[id number]]  Admitting Physician: Gladstone Lighter [[id number]]  Attending Physician: Gladstone Lighter [[id number]]  Estimated length of stay: past midnight tomorrow  Certification:: I certify this patient will need inpatient services for at least 2 midnights  PT Class (Do Not Modify): Inpatient [101]  PT Acc Code (Do Not Modify): Private [1]       Medical History Past Medical History:  Diagnosis Date  . Asthma   . COPD (chronic obstructive pulmonary disease) (HCC)    on 2L home o2  . Diabetes mellitus without complication ([place])   . Hyperlipidemia   . Hypertension   . Stroke [facility])    Was told at recent hospital visit she has stroke in past  . Thyroid disease   . Thyroid nodule 04/30/2016    Allergies No Known Allergies  IV Location/Drains/Wounds Patient Lines/Drains/Airways Status   Active Line/Drains/Airways    Name:   Placement date:   Placement time:   Site:   Days:   Peripheral IV 02/12/18 Right Antecubital   02/12/18    1716    Antecubital   less than 1          Labs/Imaging Results for orders placed or performed during the hospital encounter of 02/12/18 (from the past 48 hour(s))  CBC with Differential/Platelet     Status: Abnormal   Collection Time: 02/12/18   5:14 PM  Result Value Ref Range   WBC 6.5 4.0 - 10.5 K/uL   RBC 5.49 (H) 3.87 - 5.11 MIL/uL   Hemoglobin 15.5 (H) 12.0 - 15.0 g/dL   HCT 52.9 (H) 36.0 - 46.0 %   MCV 96.4 80.0 - 100.0 fL   MCH 28.2 26.0 - 34.0 pg   MCHC 29.3 (L) 30.0 - 36.0 g/dL   RDW 13.4 11.5 - 15.5 %   Platelets 205 150 - 400 K/uL   nRBC 0.8 (H) 0.0 - 0.2 %   Neutrophils Relative % 63 %   Neutro Abs 4.1 1.7 - 7.7 K/uL   Lymphocytes Relative 27 %   Lymphs Abs 1.8 0.7 - 4.0 K/uL   Monocytes Relative 9 %   Monocytes Absolute 0.6 0.1 - 1.0 K/uL   Eosinophils Relative 1 %   Eosinophils Absolute 0.1 0.0 - 0.5 K/uL   Basophils Relative 0 %   Basophils Absolute 0.0 0.0 - 0.1 K/uL   Immature Granulocytes 0 %   Abs Immature Granulocytes 0.02 0.00 - 0.07 K/uL    Comment: Performed at [facility], [address], [place], [place] [address]  Basic metabolic panel     Status: Abnormal   Collection Time: 02/12/18  5:14 PM  Result Value Ref Range   Sodium 140 135 - 145 mmol/L   Potassium 4.1 3.5 - 5.1 mmol/L    Comment: HEMOLYSIS AT  THIS LEVEL MAY AFFECT RESULT   Chloride 104 98 - 111 mmol/L   CO2 30 22 - 32 mmol/L   Glucose, Bld 51 (L) 70 - 99 mg/dL   BUN 27 (H) 6 - 20 mg/dL   Creatinine, Ser 2.47 (H) 0.44 - 1.00 mg/dL   Calcium 8.3 (L) 8.9 - 10.3 mg/dL   GFR calc non Af Amer 21 (L) >60 mL/min   GFR calc Af Amer 24 (L) >60 mL/min   Anion gap 6 5 - 15    Comment: Performed at [facility], [address] [place], [place] [address]  Troponin I - ONCE - STAT     Status: Abnormal   Collection Time: 02/12/18  5:14 PM  Result Value Ref Range   Troponin I 0.05 (HH) <0.03 ng/mL    Comment: CRITICAL RESULT CALLED TO, READ BACK BY AND VERIFIED WITH JESSA COLTRAINE @1757  02/12/18 AKT Performed at [facility], [address] [place], [place] [address]   Brain natriuretic peptide     Status: Abnormal   Collection Time: 02/12/18  5:14 PM  Result Value Ref Range   B Natriuretic Peptide 663.0  (H) 0.0 - 100.0 pg/mL    Comment: Performed at [facility], [address] [place], [place] [address]   Dg Chest Portable 1 View  Result Date: 02/12/2018 CLINICAL DATA:  58 year old female with history of difficulty breathing today. EXAM: PORTABLE CHEST 1 VIEW COMPARISON:  Chest x-ray 11/12/2017. FINDINGS: Lung volumes are low. No consolidative airspace disease. No pleural effusions. No pneumothorax. No definite suspicious appearing pulmonary nodules or masses are noted. Cephalization of the pulmonary vasculature, without frank pulmonary edema. Mild cardiomegaly. Upper mediastinal contours are within normal limits. IMPRESSION: 1. Cardiomegaly with pulmonary venous congestion, but no frank pulmonary edema. Electronically Signed   By: Vinnie Langton M.D.   On: 02/12/2018 17:30    Pending Labs [employer] (From admission, onward)    Start     Ordered   Signed and Held  Troponin I - Now Then Q6H  Now then every 6 hours,   R     Signed and Held   Signed and Held  Hemoglobin A1c  Once,   R     Signed and Held   Signed and Held  Basic metabolic panel  Tomorrow morning,   R     Signed and Held   Signed and Held  CBC  Tomorrow morning,   R     Signed and Held          Vitals/Pain Today's Vitals   02/12/18 2000 02/12/18 2030 02/12/18 2100 02/12/18 2139  BP: (!) 130/100 (!) 145/99 (!) 145/86 (!) 152/78  Pulse: 86 82 87 97  Resp: 18 (!) 23 15 (!) 34  Temp:      TempSrc:      SpO2: 92% 93% 94% (!) 71%  Weight:      Height:        Isolation Precautions No active isolations  Medications Medications  ipratropium-albuterol (DUONEB) 0.5-2.5 (3) MG/3ML nebulizer solution 3 mL (3 mLs Nebulization Given 02/12/18 1711)  ipratropium-albuterol (DUONEB) 0.5-2.5 (3) MG/3ML nebulizer solution 3 mL (3 mLs Nebulization Given 02/12/18 1711)  ipratropium-albuterol (DUONEB) 0.5-2.5 (3) MG/3ML nebulizer solution 3 mL (3 mLs Nebulization Given 02/12/18 1710)  methylPREDNISolone sodium succinate  (SOLU-MEDROL) 125 mg/2 mL injection 125 mg (125 mg Intravenous Given 02/12/18 1710)  furosemide (LASIX) injection 20 mg (20 mg Intravenous Given 02/12/18 1822)    Mobility  independent

## 2018-02-12 NOTE — ED Provider Notes (Signed)
[facility] Emergency Department Provider Note  ____________________________________________  Time seen: Approximately 5:30 PM  I have reviewed the triage vital signs and the nursing notes.   HISTORY  Chief Complaint Shortness of Breath   HPI Emily [name] is a 58 y.o. female with a history of COPD, asthma, diabetes, hypertension, hyperlipidemia who presents for evaluation of shortness of breath.  Patient reports a week of cough productive of clear phlegm and progressively worsening shortness of breath.  She has had wheezing.  She reports that she used to be on oxygen but was taken off of it by her primary care doctor a month ago.  She has been using her inhalers with some relief but the shortness of breath was worse today.  No fever or chills, no nausea or vomiting, no chest pain.  Her symptoms are worse with minimal exertion.  Patient denies any personal or family history of blood clots, recent travel immobilization, leg pain or swelling, hemoptysis, or exogenous hormones.  Past Medical History:  Diagnosis Date  . Asthma   . COPD (chronic obstructive pulmonary disease) ([place])   . Diabetes mellitus without complication ([place])   . Hyperlipidemia   . Hypertension   . Stroke [facility])    Was told at recent hospital visit she has stroke in past  . Thyroid disease   . Thyroid nodule 04/30/2016    Patient Active Problem List   Diagnosis Date Noted  . Puerperal sepsis with acute hypoxic respiratory failure ([place]) 11/12/2017  . Lower extremity pain, bilateral 12/17/2016  . Thyroid nodule 04/30/2016  . Left sided numbness 04/27/2016  . Muscle spasm of back 11/05/2015  . Hyperthyroidism 03/13/2015  . Goiter diffuse 03/13/2015  . Poorly controlled type 2 diabetes mellitus with circulatory disorder ([place]) 02/07/2015  . Essential hypertension 12/27/2014  . Chronic kidney disease 12/27/2014  . Tobacco abuse 12/27/2014    Past Surgical History:  Procedure  Laterality Date  . arm surgery Left     Prior to Admission medications   Medication Sig Start Date End Date Taking? Authorizing Provider  albuterol (PROVENTIL HFA;VENTOLIN HFA) 108 (90 Base) MCG/ACT inhaler Inhale 1-2 puffs into the lungs every 6 (six) hours as needed for wheezing or shortness of breath. 07/16/16   Doles-Johnson, Teah, NP  albuterol (PROVENTIL) (2.5 MG/3ML) 0.083% nebulizer solution Take 3 mLs (2.5 mg total) by nebulization every 6 (six) hours as needed for wheezing or shortness of breath. 11/18/17   Loletha Grayer, MD  amLODipine (NORVASC) 10 MG tablet Take 10 mg by mouth daily.    [provider]  aspirin EC 81 MG tablet Take 81 mg by mouth daily.    [provider]  atorvastatin (LIPITOR) 40 MG tablet TAKE ONE TABLET BY MOUTH EVERY DAY Patient taking differently: Take 40 mg by mouth at bedtime.  04/21/16   Tawni Millers, MD  budesonide-formoterol (SYMBICORT) 80-4.5 MCG/ACT inhaler Inhale 2 puffs into the lungs 2 (two) times daily. 11/18/17   Loletha Grayer, MD  carvedilol (COREG) 25 MG tablet TAKE 1 TABLET BY MOUTH TWICE DAILY Patient taking differently: Take 25 mg by mouth 2 (two) times daily.  10/14/17   Philemon Kingdom, MD  cyclobenzaprine (FLEXERIL) 5 MG tablet TAKE ONE TABLET BY MOUTH EVERY DAY AS NEEDED FOR MUSCLE SPASMS Patient taking differently: Take 5 mg by mouth daily as needed for muscle spasms.  04/20/16   Doles-Johnson, Teah, NP  Dulaglutide (TRULICITY) 1.5 YS/0.6TK SOPN Inject 1.5 mg into the skin once a week. Patient  taking differently: Inject 1.5 mg into the skin every Sunday.  09/27/17   Philemon Kingdom, MD  empagliflozin (JARDIANCE) 10 MG TABS tablet Take 10 mg by mouth daily. Before b'fast Patient taking differently: Take 10 mg by mouth daily before breakfast.  09/27/17   Philemon Kingdom, MD  insulin aspart (NOVOLOG PENFILL) cartridge Inject 6 Units into the skin 3 (three) times daily with meals. Can substitute any short acting  insulin that is covered in a pen 11/18/17   Loletha Grayer, MD  Insulin Glargine (LANTUS SOLOSTAR) 100 UNIT/ML Solostar Pen Inject under skin 50 units in am and 30 units at night Patient taking differently: Inject 30-50 Units into the skin See admin instructions. 50 units every morning and 30 units at bedtime 05/24/17   Philemon Kingdom, MD  levofloxacin (LEVAQUIN) 250 MG tablet Take 1 tablet (250 mg total) by mouth daily. 11/20/17   Loletha Grayer, MD  methimazole (TAPAZOLE) 5 MG tablet TAKE 1 TABLET BY MOUTH ONCE DAILY 12/06/17   Philemon Kingdom, MD  tiotropium (SPIRIVA) 18 MCG inhalation capsule Place 18 mcg into inhaler and inhale daily.    [provider]    Allergies Patient has no known allergies.  Family History  Problem Relation Age of Onset  . Diabetes Mother   . Hypertension Mother   . Diabetes Father   . Hypertension Father   . CAD Father   . Diabetes Sister     Social History Social History   Tobacco Use  . Smoking status: Former Smoker    Packs/day: 1.50    Types: Cigarettes    Last attempt to quit: 01/13/2016    Years since quitting: 2.0  . Smokeless tobacco: Never Used  Substance Use Topics  . Alcohol use: No  . Drug use: No    Review of Systems  Constitutional: Negative for fever. Eyes: Negative for visual changes. ENT: Negative for sore throat. Neck: No neck pain  Cardiovascular: Negative for chest pain. Respiratory: + shortness of breath and cough, wheezing Gastrointestinal: Negative for abdominal pain, vomiting or diarrhea. Genitourinary: Negative for dysuria. Musculoskeletal: Negative for back pain. Skin: Negative for rash. Neurological: Negative for headaches, weakness or numbness. Psych: No SI or HI  ____________________________________________   PHYSICAL EXAM:  VITAL SIGNS: ED Triage Vitals  Enc Vitals Group     BP 02/12/18 1705 137/87     Pulse Rate 02/12/18 1640 85     Resp 02/12/18 1710 (!) 24     Temp 02/12/18 1640  98.1 F (36.7 C)     Temp Source 02/12/18 1640 Oral     SpO2 02/12/18 1640 (!) 81 %     Weight 02/12/18 1641 225 lb (102.1 kg)     Height 02/12/18 1641 5\' 4"  (1.626 m)     Head Circumference --      Peak Flow --      Pain Score --      Pain Loc --      Pain Edu? --      Excl. in [place]? --     Constitutional: Alert and oriented. Well appearing and in no apparent distress. HEENT:      Head: Normocephalic and atraumatic.         Eyes: Conjunctivae are normal. Sclera is non-icteric.       Mouth/Throat: Mucous membranes are moist.       Neck: Supple with no signs of meningismus. Cardiovascular: Regular rate and rhythm. No murmurs, gallops, or rubs. 2+ symmetrical distal pulses are  present in all extremities. No JVD. Respiratory: Increased work of breathing, hypoxic to 81% on room air, severely diminished air movement bilaterally with tight expiratory wheezes  gastrointestinal: Soft, non tender, and non distended with positive bowel sounds. No rebound or guarding. Musculoskeletal: 1 + pitting edema bilaterally Neurologic: Normal speech and language. Face is symmetric. Moving all extremities. No gross focal neurologic deficits are appreciated. Skin: Skin is warm, dry and intact. No rash noted. Psychiatric: Mood and affect are normal. Speech and behavior are normal.  ____________________________________________   LABS (all labs ordered are listed, but only abnormal results are displayed)  Labs Reviewed  CBC WITH DIFFERENTIAL/PLATELET - Abnormal; Notable for the following components:      Result Value   RBC 5.49 (*)    Hemoglobin 15.5 (*)    HCT 52.9 (*)    MCHC 29.3 (*)    nRBC 0.8 (*)    All other components within normal limits  BASIC METABOLIC PANEL - Abnormal; Notable for the following components:   Glucose, Bld 51 (*)    BUN 27 (*)    Creatinine, Ser 2.47 (*)    Calcium 8.3 (*)    GFR calc non Af Amer 21 (*)    GFR calc Af Amer 24 (*)    All other components within normal  limits  TROPONIN I - Abnormal; Notable for the following components:   Troponin I 0.05 (*)    All other components within normal limits  BRAIN NATRIURETIC PEPTIDE - Abnormal; Notable for the following components:   B Natriuretic Peptide 663.0 (*)    All other components within normal limits   ____________________________________________  EKG  ED ECG REPORT I, Rudene Re, the attending physician, personally viewed and interpreted this ECG.  Normal sinus rhythm, rate of 83, normal intervals, normal axis, anterior Q waves, low voltage QRS, no ST elevations or depressions.  No significant changes when compared to prior.  ____________________________________________  RADIOLOGY  I have personally reviewed the images performed during this visit and I agree with the Radiologist's read.   Interpretation by Radiologist:  Dg Chest Portable 1 View  Result Date: 02/12/2018 CLINICAL DATA:  58 year old female with history of difficulty breathing today. EXAM: PORTABLE CHEST 1 VIEW COMPARISON:  Chest x-ray 11/12/2017. FINDINGS: Lung volumes are low. No consolidative airspace disease. No pleural effusions. No pneumothorax. No definite suspicious appearing pulmonary nodules or masses are noted. Cephalization of the pulmonary vasculature, without frank pulmonary edema. Mild cardiomegaly. Upper mediastinal contours are within normal limits. IMPRESSION: 1. Cardiomegaly with pulmonary venous congestion, but no frank pulmonary edema. Electronically Signed   By: Vinnie Langton M.D.   On: 02/12/2018 17:30     ____________________________________________   PROCEDURES  Procedure(s) performed: None Procedures Critical Care performed: yes  CRITICAL CARE Performed by: Rudene Re  ?  Total critical care time: 35 min  Critical care time was exclusive of separately billable procedures and treating other patients.  Critical care was necessary to treat or prevent imminent or  life-threatening deterioration.  Critical care was time spent personally by me on the following activities: development of treatment plan with patient and/or surrogate as well as nursing, discussions with consultants, evaluation of patient's response to treatment, examination of patient, obtaining history from patient or surrogate, ordering and performing treatments and interventions, ordering and review of laboratory studies, ordering and review of radiographic studies, pulse oximetry and re-evaluation of patient's condition.  ____________________________________________   INITIAL IMPRESSION / ASSESSMENT AND PLAN / ED COURSE  58  y.o. female with a history of COPD, asthma, diabetes, hypertension, hyperlipidemia who presents for evaluation of shortness of breath, wheezing, cough x 1 week. No fever, no CP  Patient is hypoxic and satting 81% on room air, increased work of breathing, severely diminished air movement bilaterally with tight expiratory wheezes.  Differential diagnosis including COPD exacerbation versus bronchitis versus pneumonia versus CHF exacerbation.  With no fever, chills, vomiting or diarrhea less likely influenza. Last ECHO in 04/2016 showing normal EF  Chest x-ray, labs, EKG pending.  Will treat with DuoNeb and Solu-Medrol.    _________________________ 6:06 PM on 02/12/2018 -----------------------------------------  Work-up consistent with COPD and possible new CHF exacerbation with cardiomegaly, pulmonary edema on chest x-ray, pitting edema.  Patient given Lasix 20 mg, Solu-Medrol and DuoNeb's.  Remains with new oxygen requirement.  Will admit to the hospitalist service.   As part of my medical decision making, I reviewed the following data within the [place] notes reviewed and incorporated, Labs reviewed , EKG interpreted , Old EKG reviewed, Old chart reviewed, Radiograph reviewed , Discussed with admitting physician , Notes from prior ED visits  and [place] Controlled Substance Database    Pertinent labs & imaging results that were available during my care of the patient were reviewed by me and considered in my medical decision making (see chart for details).    ____________________________________________   FINAL CLINICAL IMPRESSION(S) / ED DIAGNOSES  Final diagnoses:  Acute respiratory failure with hypoxia (HCC)  Acute pulmonary edema (HCC)  COPD exacerbation (HCC)      NEW MEDICATIONS STARTED DURING THIS VISIT:  ED Discharge Orders    None       Note:  This document was prepared using Dragon voice recognition software and may include unintentional dictation errors.    Alfred Levins, [place], MD 02/12/18 [phone]

## 2018-02-12 NOTE — ED Notes (Signed)
Pt given meal tray at this time. Pt in NAD. Waiting on room assignment.

## 2018-02-12 NOTE — ED Triage Notes (Signed)
Sob x 1 week. Use to have oxygen at home but her pcp d/c'd

## 2018-02-13 ENCOUNTER — Inpatient Hospital Stay (HOSPITAL_COMMUNITY)
Admit: 2018-02-13 | Discharge: 2018-02-13 | Disposition: A | Discharge status: Home / Self Care | Payer: Medicaid Other | Attending: Internal Medicine | Admitting: Internal Medicine

## 2018-02-13 DIAGNOSIS — R0602 Shortness of breath: Secondary | ICD-10-CM

## 2018-02-13 DIAGNOSIS — I509 Heart failure, unspecified: Secondary | ICD-10-CM

## 2018-02-13 LAB — ECHOCARDIOGRAM COMPLETE
Height: 64 in
Weight: 3748.8 oz

## 2018-02-13 LAB — HEMOGLOBIN A1C
Hgb A1c MFr Bld: 6.7 % — ABNORMAL HIGH (ref 4.8–5.6)
Mean Plasma Glucose: 145.59 mg/dL

## 2018-02-13 LAB — CBC
HEMATOCRIT: 50.5 % — AB (ref 36.0–46.0)
Hemoglobin: 14.9 g/dL (ref 12.0–15.0)
MCH: 28 pg (ref 26.0–34.0)
MCHC: 29.5 g/dL — ABNORMAL LOW (ref 30.0–36.0)
MCV: 94.9 fL (ref 80.0–100.0)
Platelets: 198 10*3/uL (ref 150–400)
RBC: 5.32 MIL/uL — ABNORMAL HIGH (ref 3.87–5.11)
RDW: 13.5 % (ref 11.5–15.5)
WBC: 4.2 10*3/uL (ref 4.0–10.5)
nRBC: 0.7 % — ABNORMAL HIGH (ref 0.0–0.2)

## 2018-02-13 LAB — GLUCOSE, CAPILLARY
GLUCOSE-CAPILLARY: 306 mg/dL — AB (ref 70–99)
Glucose-Capillary: 252 mg/dL — ABNORMAL HIGH (ref 70–99)
Glucose-Capillary: 314 mg/dL — ABNORMAL HIGH (ref 70–99)
Glucose-Capillary: 313 mg/dL — ABNORMAL HIGH (ref 70–99)

## 2018-02-13 LAB — BASIC METABOLIC PANEL
Anion gap: 7 (ref 5–15)
BUN: 29 mg/dL — ABNORMAL HIGH (ref 6–20)
CO2: 27 mmol/L (ref 22–32)
Calcium: 8.2 mg/dL — ABNORMAL LOW (ref 8.9–10.3)
Chloride: 107 mmol/L (ref 98–111)
Creatinine, Ser: 2.49 mg/dL — ABNORMAL HIGH (ref 0.44–1.00)
GFR, EST AFRICAN AMERICAN: 24 mL/min — AB (ref >60)
GFR, EST NON AFRICAN AMERICAN: 20 mL/min — AB (ref >60)
Glucose, Bld: 252 mg/dL — ABNORMAL HIGH (ref 70–99)
Potassium: 4.8 mmol/L (ref 3.5–5.1)
Sodium: 141 mmol/L (ref 135–145)

## 2018-02-13 LAB — TROPONIN I
Troponin I: 0.04 ng/mL (ref <0.03)
Troponin I: 0.03 ng/mL (ref <0.03)
Troponin I: 0.16 ng/mL (ref <0.03)

## 2018-02-13 MED ORDER — PERFLUTREN LIPID MICROSPHERE
1.0000 mL | INTRAVENOUS | Status: AC | PRN
  Administered 2018-02-13: 4 mL via INTRAVENOUS
  Filled 2018-02-13: qty 10

## 2018-02-13 MED ORDER — METHYLPREDNISOLONE SODIUM SUCC 125 MG IJ SOLR
60.0000 mg | Freq: Four times a day (QID) | INTRAMUSCULAR | Status: DC
  Administered 2018-02-13 – 2018-02-17 (×17): 60 mg via INTRAVENOUS
  Filled 2018-02-13 (×17): qty 2

## 2018-02-13 MED ORDER — IPRATROPIUM-ALBUTEROL 0.5-2.5 (3) MG/3ML IN SOLN
3.0000 mL | Freq: Four times a day (QID) | RESPIRATORY_TRACT | Status: DC
  Administered 2018-02-13 – 2018-02-18 (×20): 3 mL via RESPIRATORY_TRACT
  Filled 2018-02-13 (×20): qty 3

## 2018-02-13 MED ORDER — BUDESONIDE 0.25 MG/2ML IN SUSP
0.2500 mg | Freq: Two times a day (BID) | RESPIRATORY_TRACT | Status: DC
  Administered 2018-02-13 – 2018-02-18 (×9): 0.25 mg via RESPIRATORY_TRACT
  Filled 2018-02-13 (×10): qty 2

## 2018-02-13 MED ORDER — INSULIN GLARGINE 100 UNIT/ML ~~LOC~~ SOLN
6.0000 [IU] | Freq: Every day | SUBCUTANEOUS | Status: DC
  Administered 2018-02-13: 6 [IU] via SUBCUTANEOUS
  Filled 2018-02-13 (×2): qty 0.06

## 2018-02-13 MED ORDER — DULAGLUTIDE 1.5 MG/0.5ML ~~LOC~~ SOAJ
1.5000 mg | SUBCUTANEOUS | Status: DC
  Administered 2018-02-13: 1.5 mg via SUBCUTANEOUS

## 2018-02-13 NOTE — Progress Notes (Signed)
Pharmacy called and reported that we do not carry the Dulaglutide SOPN 1.5 mg. Pt was informed and states " I will call my mother to bring the medicine for me". Will continue to monitor.

## 2018-02-13 NOTE — Plan of Care (Signed)
  Problem: Clinical Measurements: Goal: Ability to maintain clinical measurements within normal limits will improve Outcome: Not Progressing Note:  Creatinine level is elevated at 2.5. Will continue to monitor renal function. Wenda Low [facility]

## 2018-02-13 NOTE — Plan of Care (Signed)
  Problem: Health Behavior/Discharge Planning: Goal: Ability to manage health-related needs will improve Outcome: Progressing   Problem: Clinical Measurements: Goal: Respiratory complications will improve Outcome: Progressing   

## 2018-02-13 NOTE — Progress Notes (Signed)
[place] at [facility]                                                                                                                                                                                  Patient Demographics   Emily [name], is a 57 y.o. female, DOB - 23-Apr-1960, KNL:[id number]  Admit date - 02/12/2018   Admitting Physician Gladstone Lighter, MD  Outpatient Primary MD for the patient is Gennette Pac, FNP   LOS - 1  Subjective: Patient states that her breathing is improved.  She denies any chest pain or palpitations    Review of Systems:   CONSTITUTIONAL: No documented fever. No fatigue, weakness. No weight gain, no weight loss.  EYES: No blurry or double vision.  ENT: No tinnitus. No postnasal drip. No redness of the oropharynx.  RESPIRATORY: No cough, no wheeze, no hemoptysis.  Positive dyspnea.  CARDIOVASCULAR: No chest pain. No orthopnea. No palpitations. No syncope.  GASTROINTESTINAL: No nausea, no vomiting or diarrhea. No abdominal pain. No melena or hematochezia.  GENITOURINARY: No dysuria or hematuria.  ENDOCRINE: No polyuria or nocturia. No heat or cold intolerance.  HEMATOLOGY: No anemia. No bruising. No bleeding.  INTEGUMENTARY: No rashes. No lesions.  MUSCULOSKELETAL: No arthritis. No swelling. No gout.  NEUROLOGIC: No numbness, tingling, or ataxia. No seizure-type activity.  PSYCHIATRIC: No anxiety. No insomnia. No ADD.    Vitals:   Vitals:   02/13/18 0027 02/13/18 0438 02/13/18 0642 02/13/18 0849  BP:    (!) 146/81  Pulse:    89  Resp:    18  Temp:    98.8 F (37.1 C)  TempSrc:    Oral  SpO2: 93% 92%  93%  Weight:   106.3 kg   Height:        Wt Readings from Last 3 Encounters:  02/13/18 106.3 kg  12/30/17 98.9 kg  11/12/17 99.8 kg    No intake or output data in the 24 hours ending 02/13/18 1317  Physical Exam:   GENERAL: Pleasant-appearing in no apparent distress.  HEAD, EYES, EARS, NOSE AND  THROAT: Atraumatic, normocephalic. Extraocular muscles are intact. Pupils equal and reactive to light. Sclerae anicteric. No conjunctival injection. No oro-pharyngeal erythema.  NECK: Supple. There is no jugular venous distention. No bruits, no lymphadenopathy, no thyromegaly.  HEART: Regular rate and rhythm,. No murmurs, no rubs, no clicks.  LUNGS: No occasional crackles at the base  aBDOMEN: Soft, flat, nontender, nondistended. Has good bowel sounds. No hepatosplenomegaly appreciated.  EXTREMITIES: No evidence of any cyanosis, clubbing, or peripheral edema.  +2 pedal and radial pulses bilaterally.  NEUROLOGIC: The patient is alert, awake, and oriented x3  with no focal motor or sensory deficits appreciated bilaterally.  SKIN: Moist and warm with no rashes appreciated.  Psych: Not anxious, depressed LN: No inguinal LN enlargement    Antibiotics   Anti-infectives (From admission, onward)   Start     Dose/Rate Route Frequency Ordered Stop   02/12/18 2330  cephALEXin (KEFLEX) capsule 250 mg     250 mg Oral Every 8 hours 02/12/18 2322     02/12/18 2245  cephALEXin (KEFLEX) capsule 500 mg  Status:  Discontinued     500 mg Oral Every 8 hours 02/12/18 2240 02/12/18 2321      Medications   Scheduled Meds: . amLODipine  10 mg Oral Daily  . aspirin EC  81 mg Oral Daily  . atorvastatin  40 mg Oral QHS  . budesonide (PULMICORT) nebulizer solution  0.25 mg Nebulization BID  . carvedilol  25 mg Oral BID  . cephALEXin  250 mg Oral Q8H  . docusate sodium  100 mg Oral BID  . Dulaglutide  1.5 mg Subcutaneous Q Sun  . furosemide  40 mg Intravenous Q12H  . heparin injection (subcutaneous)  5,000 Units Subcutaneous Q8H  . insulin aspart  0-5 Units Subcutaneous QHS  . insulin aspart  0-9 Units Subcutaneous TID WC  . insulin aspart  6 Units Subcutaneous TID WC  . ipratropium-albuterol  3 mL Nebulization Q6H  . methimazole  5 mg Oral Daily  . methylPREDNISolone (SOLU-MEDROL) injection  60 mg  Intravenous Q6H   Continuous Infusions: PRN Meds:.acetaminophen **OR** acetaminophen, ondansetron **OR** ondansetron (ZOFRAN) IV, polyethylene glycol   Data Review:   Micro Results No results found for this or any previous visit (from the past 240 hour(s)).  Radiology Reports Dg Chest Portable 1 View  Result Date: 02/12/2018 CLINICAL DATA:  58 year old female with history of difficulty breathing today. EXAM: PORTABLE CHEST 1 VIEW COMPARISON:  Chest x-ray 11/12/2017. FINDINGS: Lung volumes are low. No consolidative airspace disease. No pleural effusions. No pneumothorax. No definite suspicious appearing pulmonary nodules or masses are noted. Cephalization of the pulmonary vasculature, without frank pulmonary edema. Mild cardiomegaly. Upper mediastinal contours are within normal limits. IMPRESSION: 1. Cardiomegaly with pulmonary venous congestion, but no frank pulmonary edema. Electronically Signed   By: Vinnie Langton M.D.   On: 02/12/2018 17:30     CBC Recent Labs  Lab 02/12/18 1714 02/13/18 0447  WBC 6.5 4.2  HGB 15.5* 14.9  HCT 52.9* 50.5*  PLT 205 198  MCV 96.4 94.9  MCH 28.2 28.0  MCHC 29.3* 29.5*  RDW 13.4 13.5  LYMPHSABS 1.8  --   MONOABS 0.6  --   EOSABS 0.1  --   BASOSABS 0.0  --     Chemistries  Recent Labs  Lab 02/12/18 1714 02/13/18 0447  NA 140 141  K 4.1 4.8  CL 104 107  CO2 30 27  GLUCOSE 51* 252*  BUN 27* 29*  CREATININE 2.47* 2.49*  CALCIUM 8.3* 8.2*   ------------------------------------------------------------------------------------------------------------------ estimated creatinine clearance is 28.9 mL/min (A) (by C-G formula based on SCr of 2.49 mg/dL (H)). ------------------------------------------------------------------------------------------------------------------ Recent Labs    02/12/18 2247  HGBA1C 6.7*   ------------------------------------------------------------------------------------------------------------------ No results  for input(s): CHOL, HDL, LDLCALC, TRIG, CHOLHDL, LDLDIRECT in the last 72 hours. ------------------------------------------------------------------------------------------------------------------ No results for input(s): TSH, T4TOTAL, T3FREE, THYROIDAB in the last 72 hours.  Invalid input(s): FREET3 ------------------------------------------------------------------------------------------------------------------ No results for input(s): VITAMINB12, FOLATE, FERRITIN, TIBC, IRON, RETICCTPCT in the last 72 hours.  Coagulation profile No results for input(s): INR, PROTIME  in the last 168 hours.  No results for input(s): DDIMER in the last 72 hours.  Cardiac Enzymes Recent Labs  Lab 02/12/18 2247 02/13/18 0447 02/13/18 1058  TROPONINI 0.04* 0.03* 0.16*   ------------------------------------------------------------------------------------------------------------------ Invalid input(s): POCBNP    Assessment & Plan   Emily [name]  is a 58 y.o. female with a known history of COPD on 2 L home oxygen, hypertension, hyperlipidemia, obesity, insulin-dependent diabetes mellitus, hypothyroidism presents to hospital secondary to worsening shortness of breath over the past week.  1.  Acute on chronic COPD exacerbation- continue therapy with DuoNeb Solu-Medrol, I will add Pulmicort to current regimen   2.  CHF exacerbation-likely acute diastolic.  Last echo with normal EF. -Continue IV Lasix -Await echocardiogram results  3.  Left lower extremity cellulitis-started with skin breakdown and lower extremity edema.  No fevers.  Normal WBC.  Tender and erythematous.    Continue oral Keflex  4.  CKD stage IV-monitor while on Lasix.  Avoid nephrotoxins  5.  Diabetes mellitus-continue home medications.    Blood sugars elevated due to steroid We will we will place patient on Lantus for now  6.  Hypertension-on Coreg, Norvasc.  Lasix is added so monitor closely  7.  DVT  prophylaxis-subcutaneous heparin      Code Status Orders  (From admission, onward)         Start     Ordered   02/12/18 2241  Full code  Continuous     02/12/18 2240        Code Status History    Date Active Date Inactive Code Status Order ID Comments User Context   11/12/2017 1802 11/18/2017 2140 Full Code [id number]  Loletha Grayer, MD ED   04/27/2016 2025 04/28/2016 1954 Full Code [id number]  Demetrios Loll, MD Inpatient   01/15/2016 0358 01/16/2016 1752 Full Code [id number]  Hugelmeyer, Ubaldo Glassing, DO Inpatient           Consults 59min DVT Prophylaxis continue heparin  Lab Results  Component Value Date   PLT 198 02/13/2018     Time Spent in minutes   35 minutes  Greater than 50% of time spent in care coordination and counseling patient regarding the condition and plan of care.   Dustin Flock M.D on 02/13/2018 at 1:17 PM  Between 7am to 6pm - Pager - [phone]  After 6pm go to [email or web address] - Proofreader  [employer]   Office  [phone]

## 2018-02-13 NOTE — Progress Notes (Signed)
Advanced care plan.  Purpose of the Encounter: CODE STATUS  Parties in Attendance: Patient herself  Patient's Decision Capacity: Intact  Subjective/Patient's story: Patient is 58 year old with chronic respiratory failure presenting with worsening shortness of breath noted to have acute on chronic COPD exasperation CHF exasperation   Objective/Medical story I discussed with the patient regarding her desires for cardiac and pulmonary resuscitation   Goals of care determination: Patient states that she would like everything to be done and wants to be a full code    CODE STATUS: Full code   Time spent discussing advanced care planning: 16 minutes

## 2018-02-13 NOTE — Progress Notes (Signed)
*  PRELIMINARY RESULTS* Echocardiogram 2D Echocardiogram has been performed. Definity IV Contrast used on this study.  [place] 02/13/2018, 9:58 AM

## 2018-02-14 ENCOUNTER — Other Ambulatory Visit: Payer: Self-pay

## 2018-02-14 LAB — GLUCOSE, CAPILLARY
GLUCOSE-CAPILLARY: 335 mg/dL — AB (ref 70–99)
GLUCOSE-CAPILLARY: 394 mg/dL — AB (ref 70–99)
Glucose-Capillary: 294 mg/dL — ABNORMAL HIGH (ref 70–99)
Glucose-Capillary: 329 mg/dL — ABNORMAL HIGH (ref 70–99)

## 2018-02-14 LAB — BASIC METABOLIC PANEL
Anion gap: 9 (ref 5–15)
BUN: 43 mg/dL — ABNORMAL HIGH (ref 6–20)
CALCIUM: 8.3 mg/dL — AB (ref 8.9–10.3)
CO2: 27 mmol/L (ref 22–32)
Chloride: 99 mmol/L (ref 98–111)
Creatinine, Ser: 2.46 mg/dL — ABNORMAL HIGH (ref 0.44–1.00)
GFR calc Af Amer: 24 mL/min — ABNORMAL LOW (ref >60)
GFR calc non Af Amer: 21 mL/min — ABNORMAL LOW (ref >60)
Glucose, Bld: 293 mg/dL — ABNORMAL HIGH (ref 70–99)
Potassium: 4.4 mmol/L (ref 3.5–5.1)
Sodium: 135 mmol/L (ref 135–145)

## 2018-02-14 MED ORDER — INSULIN GLARGINE 100 UNIT/ML ~~LOC~~ SOLN
12.0000 [IU] | Freq: Every day | SUBCUTANEOUS | Status: DC
  Administered 2018-02-14 – 2018-02-15 (×2): 12 [IU] via SUBCUTANEOUS
  Filled 2018-02-14 (×3): qty 0.12

## 2018-02-14 MED ORDER — ORAL CARE MOUTH RINSE
15.0000 mL | Freq: Two times a day (BID) | OROMUCOSAL | Status: DC
  Administered 2018-02-14 – 2018-02-17 (×6): 15 mL via OROMUCOSAL

## 2018-02-14 NOTE — Progress Notes (Signed)
Inpatient Diabetes Program Recommendations  AACE/ADA: New Consensus Statement on Inpatient Glycemic Control (2015)  Target Ranges:  Prepandial:   less than 140 mg/dL      Peak postprandial:   less than 180 mg/dL (1-2 hours)      Critically ill patients:  140 - 180 mg/dL   Results for Emily [name], Emily [name] (MRN [id number]) as of 02/14/2018 08:31  Ref. Range 02/13/2018 08:50 02/13/2018 11:47 02/13/2018 16:20 02/13/2018 20:56  Glucose-Capillary Latest Ref Range: 70 - 99 mg/dL 252 (H)  11 units NOVOLOG  314 (H)  13 units NOVOLOG  313 (H)  13 units NOVOLOG  306 (H)  4 units NOVOLOG +  6 units LANTUS    Results for [name], [name] (MRN [id number]) as of 02/14/2018 08:31  Ref. Range 02/14/2018 07:45  Glucose-Capillary Latest Ref Range: 70 - 99 mg/dL 294 (H)  11 units NOVOLOG    Results for [name], [name] (MRN [id number]) as of 02/14/2018 08:31  Ref. Range 12/30/2017 12:47 02/12/2018 22:47  Hemoglobin A1C Latest Ref Range: 4.8 - 5.6 % 7.1 (A) 6.7 (H)    Admit with: SOB  History: DM, COPD, CKD 4  Home DM Meds: Trulicity 1.5 mg Qweek       Jardiance 10 mg Daily       Novolog 6 units TID with meals       Lantus 50 units AM/ 30 units QHS  Current Orders: Lantus 6 units QHS     Novolog Sensitive Correction Scale/ SSI (0-9 units) TID AC + HS     Novolog 6 units TID with meals      Getting Solumedrol 60 mg Q6 hours.  CBGs elevated likely due to steroids.  Current A1c of 6.7% shows good glucose control at home.    MD- Patient takes much larger dose of Lantus at home per records (50 units AM/ 30 units PM for total of 80 units per day).  Please changing Lantus to the following:  Lantus 25 units AM/ 15 units QHS (50% total home dose)    --Will follow patient during hospitalization--  Wyn Quaker RN, MSN, CDE Diabetes Coordinator Inpatient Glycemic Control Team Team Pager: [phone] (8a-5p)

## 2018-02-14 NOTE — Plan of Care (Signed)
  Problem: Education: Goal: Knowledge of General Education information will improve Description Including pain rating scale, medication(s)/side effects and non-pharmacologic comfort measures Outcome: Progressing   Problem: Health Behavior/Discharge Planning: Goal: Ability to manage health-related needs will improve Outcome: Progressing   Problem: Education: Goal: Ability to demonstrate management of disease process will improve Outcome: Progressing Goal: Ability to verbalize understanding of medication therapies will improve Outcome: Progressing Goal: Individualized Educational Video(s) Outcome: Progressing

## 2018-02-14 NOTE — Progress Notes (Signed)
[facility]                                                                                                                                                                                  Patient Demographics   Emily [name], is a 58 y.o. female, DOB - [birth year], HWE:[id number]  Admit date - 02/12/2018   Admitting Physician Gladstone Lighter, MD  Outpatient Primary MD for the patient is Gennette Pac, FNP   LOS - 2  Subjective: Patient's shortness of breath is improving she is on 4 L oxygen.  Denies any chest pain    Review of Systems:   CONSTITUTIONAL: No documented fever. No fatigue, weakness. No weight gain, no weight loss.  EYES: No blurry or double vision.  ENT: No tinnitus. No postnasal drip. No redness of the oropharynx.  RESPIRATORY: No cough, no wheeze, no hemoptysis.  Positive dyspnea.  CARDIOVASCULAR: No chest pain. No orthopnea. No palpitations. No syncope.  GASTROINTESTINAL: No nausea, no vomiting or diarrhea. No abdominal pain. No melena or hematochezia.  GENITOURINARY: No dysuria or hematuria.  ENDOCRINE: No polyuria or nocturia. No heat or cold intolerance.  HEMATOLOGY: No anemia. No bruising. No bleeding.  INTEGUMENTARY: No rashes. No lesions.  MUSCULOSKELETAL: No arthritis. No swelling. No gout.  NEUROLOGIC: No numbness, tingling, or ataxia. No seizure-type activity.  PSYCHIATRIC: No anxiety. No insomnia. No ADD.    Vitals:   Vitals:   02/14/18 0440 02/14/18 0744 02/14/18 0747 02/14/18 0801  BP: 125/75 123/83    Pulse: 83 80    Resp:    18  Temp: (!) 97.5 F (36.4 C) 98 F (36.7 C)    TempSrc: Oral Oral    SpO2: 93% 96% 92%   Weight: 108.1 kg     Height:        Wt Readings from Last 3 Encounters:  02/14/18 108.1 kg  12/30/17 98.9 kg  11/12/17 99.8 kg     Intake/Output Summary (Last 24 hours) at 02/14/2018 1238 Last data filed at 02/14/2018 1033 Gross per 24 hour  Intake 600 ml  Output 700 ml   Net -100 ml    Physical Exam:   GENERAL: Pleasant-appearing in no apparent distress.  HEAD, EYES, EARS, NOSE AND THROAT: Atraumatic, normocephalic. Extraocular muscles are intact. Pupils equal and reactive to light. Sclerae anicteric. No conjunctival injection. No oro-pharyngeal erythema.  NECK: Supple. There is no jugular venous distention. No bruits, no lymphadenopathy, no thyromegaly.  HEART: Regular rate and rhythm,. No murmurs, no rubs, no clicks.  LUNGS: No occasional crackles at the base  aBDOMEN: Soft, flat, nontender, nondistended. Has good bowel sounds. No hepatosplenomegaly appreciated.  EXTREMITIES:  No evidence of any cyanosis, clubbing, or peripheral edema.  +2 pedal and radial pulses bilaterally.  NEUROLOGIC: The patient is alert, awake, and oriented x3 with no focal motor or sensory deficits appreciated bilaterally.  SKIN: Moist and warm with no rashes appreciated.  Psych: Not anxious, depressed LN: No inguinal LN enlargement    Antibiotics   Anti-infectives (From admission, onward)   Start     Dose/Rate Route Frequency Ordered Stop   02/12/18 2330  cephALEXin (KEFLEX) capsule 250 mg     250 mg Oral Every 8 hours 02/12/18 2322     02/12/18 2245  cephALEXin (KEFLEX) capsule 500 mg  Status:  Discontinued     500 mg Oral Every 8 hours 02/12/18 2240 02/12/18 2321      Medications   Scheduled Meds: . amLODipine  10 mg Oral Daily  . aspirin EC  81 mg Oral Daily  . atorvastatin  40 mg Oral QHS  . budesonide (PULMICORT) nebulizer solution  0.25 mg Nebulization BID  . carvedilol  25 mg Oral BID  . cephALEXin  250 mg Oral Q8H  . docusate sodium  100 mg Oral BID  . Dulaglutide  1.5 mg Subcutaneous Q Sun  . furosemide  40 mg Intravenous Q12H  . heparin injection (subcutaneous)  5,000 Units Subcutaneous Q8H  . insulin aspart  0-5 Units Subcutaneous QHS  . insulin aspart  0-9 Units Subcutaneous TID WC  . insulin aspart  6 Units Subcutaneous TID WC  . insulin glargine   6 Units Subcutaneous QHS  . ipratropium-albuterol  3 mL Nebulization Q6H  . mouth rinse  15 mL Mouth Rinse BID  . methimazole  5 mg Oral Daily  . methylPREDNISolone (SOLU-MEDROL) injection  60 mg Intravenous Q6H   Continuous Infusions: PRN Meds:.acetaminophen **OR** acetaminophen, ondansetron **OR** ondansetron (ZOFRAN) IV, polyethylene glycol   Data Review:   Micro Results No results found for this or any previous visit (from the past 240 hour(s)).  Radiology Reports Dg Chest Portable 1 View  Result Date: 02/12/2018 CLINICAL DATA:  58 year old female with history of difficulty breathing today. EXAM: PORTABLE CHEST 1 VIEW COMPARISON:  Chest x-ray 11/12/2017. FINDINGS: Lung volumes are low. No consolidative airspace disease. No pleural effusions. No pneumothorax. No definite suspicious appearing pulmonary nodules or masses are noted. Cephalization of the pulmonary vasculature, without frank pulmonary edema. Mild cardiomegaly. Upper mediastinal contours are within normal limits. IMPRESSION: 1. Cardiomegaly with pulmonary venous congestion, but no frank pulmonary edema. Electronically Signed   By: Vinnie Langton M.D.   On: 02/12/2018 17:30     CBC Recent Labs  Lab 02/12/18 1714 02/13/18 0447  WBC 6.5 4.2  HGB 15.5* 14.9  HCT 52.9* 50.5*  PLT 205 198  MCV 96.4 94.9  MCH 28.2 28.0  MCHC 29.3* 29.5*  RDW 13.4 13.5  LYMPHSABS 1.8  --   MONOABS 0.6  --   EOSABS 0.1  --   BASOSABS 0.0  --     Chemistries  Recent Labs  Lab 02/12/18 1714 02/13/18 0447 02/14/18 0507  NA 140 141 135  K 4.1 4.8 4.4  CL 104 107 99  CO2 30 27 27   GLUCOSE 51* 252* 293*  BUN 27* 29* 43*  CREATININE 2.47* 2.49* 2.46*  CALCIUM 8.3* 8.2* 8.3*   ------------------------------------------------------------------------------------------------------------------ estimated creatinine clearance is 29.6 mL/min (A) (by C-G formula based on SCr of 2.46 mg/dL  (H)). ------------------------------------------------------------------------------------------------------------------ Recent Labs    02/12/18 2247  HGBA1C 6.7*   ------------------------------------------------------------------------------------------------------------------ No results for  input(s): CHOL, HDL, LDLCALC, TRIG, CHOLHDL, LDLDIRECT in the last 72 hours. ------------------------------------------------------------------------------------------------------------------ No results for input(s): TSH, T4TOTAL, T3FREE, THYROIDAB in the last 72 hours.  Invalid input(s): FREET3 ------------------------------------------------------------------------------------------------------------------ No results for input(s): VITAMINB12, FOLATE, FERRITIN, TIBC, IRON, RETICCTPCT in the last 72 hours.  Coagulation profile No results for input(s): INR, PROTIME in the last 168 hours.  No results for input(s): DDIMER in the last 72 hours.  Cardiac Enzymes Recent Labs  Lab 02/12/18 2247 02/13/18 0447 02/13/18 1058  TROPONINI 0.04* 0.03* 0.16*   ------------------------------------------------------------------------------------------------------------------ Invalid input(s): POCBNP    Assessment & Plan   Emily [name]  is a 58 y.o. female with a known history of COPD on 2 L home oxygen, hypertension, hyperlipidemia, obesity, insulin-dependent diabetes mellitus, hypothyroidism presents to hospital secondary to worsening shortness of breath over the past week.  1.  Acute on chronic COPD exacerbation- continue therapy with DuoNeb Solu-Medrol, continue Pulmicort   2.  CHF exacerbation-likely acute diastolic.  Last echo with normal EF. -Continue IV Lasix -Await echocardiogram results  3.  Left lower extremity cellulitis-started with skin breakdown and lower extremity edema.  No fevers.  Normal WBC.  Tender and erythematous.    Continue oral Keflex  4.  CKD stage IV-monitor  while on Lasix.  Avoid nephrotoxins  5.  Diabetes mellitus-continue home medications.    Blood sugars continues to be elevated due to steroid Increase Lantus  6.  Hypertension-on Coreg, Norvasc.  Lasix is added so monitor closely  7.  DVT prophylaxis-subcutaneous heparin      Code Status Orders  (From admission, onward)         Start     Ordered   02/12/18 2241  Full code  Continuous     02/12/18 2240        Code Status History    Date Active Date Inactive Code Status Order ID Comments User Context   11/12/2017 1802 11/18/2017 2140 Full Code [id number]  Loletha Grayer, MD ED   04/27/2016 2025 04/28/2016 1954 Full Code [id number]  Demetrios Loll, MD Inpatient   01/15/2016 0358 01/16/2016 1752 Full Code [id number]  Hugelmeyer, Ubaldo Glassing, DO Inpatient           Consults 24min DVT Prophylaxis continue heparin  Lab Results  Component Value Date   PLT 198 02/13/2018     Time Spent in minutes   35 minutes  Greater than 50% of time spent in care coordination and counseling patient regarding the condition and plan of care.   Dustin Flock M.D on 02/14/2018 at 12:38 PM  Between 7am to 6pm - Pager - [phone]  After 6pm go to [email or web address] - Proofreader  [employer]   Office  [phone]

## 2018-02-15 ENCOUNTER — Inpatient Hospital Stay: Payer: Medicaid Other

## 2018-02-15 LAB — GLUCOSE, CAPILLARY
Glucose-Capillary: 370 mg/dL — ABNORMAL HIGH (ref 70–99)
Glucose-Capillary: 436 mg/dL — ABNORMAL HIGH (ref 70–99)
Glucose-Capillary: 427 mg/dL — ABNORMAL HIGH (ref 70–99)
Glucose-Capillary: 380 mg/dL — ABNORMAL HIGH (ref 70–99)

## 2018-02-15 MED ORDER — PREDNISONE 10 MG (21) PO TBPK: ORAL_TABLET | ORAL | 0 refills | Status: DC

## 2018-02-15 MED ORDER — INSULIN ASPART 100 UNIT/ML ~~LOC~~ SOLN
14.0000 [IU] | Freq: Once | SUBCUTANEOUS | Status: AC
  Administered 2018-02-15: 14 [IU] via SUBCUTANEOUS
  Filled 2018-02-15: qty 1

## 2018-02-15 MED ORDER — FUROSEMIDE 40 MG PO TABS: 40.0000 mg | ORAL_TABLET | Freq: Two times a day (BID) | ORAL | 11 refills | Status: DC

## 2018-02-15 MED ORDER — CEPHALEXIN 250 MG PO CAPS: 250.0000 mg | ORAL_CAPSULE | Freq: Three times a day (TID) | ORAL | 0 refills | Status: AC

## 2018-02-15 NOTE — Progress Notes (Signed)
Patients blood sugar 436. Dr. Posey Pronto notified, verbal orders with read back to administer 14 units of novolog plus the 6units additional scheduled with sliding scale.

## 2018-02-15 NOTE — Care Management Note (Signed)
Case Management Note  Patient Details  Name: Romonia Yanik MRN: [id number] Date of Birth: February 24, 1960  Subjective/Objective:      Patient is discharging today with her mother.  She was admitted for SOB; CHF; COPD exacerbation.  Per patient she is on oxygen at home PRN.  Patient had a sleep study in January that was positive for sleep apnea.  This RNCM called PCP; Lolly Mustache have not received any information on a sleep study.  Called [facility] and left message for them to call patient as her sleep study results need to be sent to PCP so PCP can arrange for CPAP at home.  Denies difficulties obtaining medications or with accessing healthcare.  Independent in all ADL's.  No further needs identified at this time by CM.             Action/Plan:   Expected Discharge Date:  02/15/18               Expected Discharge Plan:  Home/Self Care  In-House Referral:     Discharge planning Services  CM Consult  Post Acute Care Choice:    Choice offered to:     DME Arranged:    DME Agency:     HH Arranged:    HH Agency:     Status of Service:  Completed, signed off  If discussed at [employer] of Stay Meetings, dates discussed:    Additional Comments:  Elza Rafter, RN 02/15/2018, 2:01 PM

## 2018-02-15 NOTE — Progress Notes (Addendum)
Inpatient Diabetes Program Recommendations  AACE/ADA: New Consensus Statement on Inpatient Glycemic Control (2015)  Target Ranges:  Prepandial:   less than 140 mg/dL      Peak postprandial:   less than 180 mg/dL (1-2 hours)      Critically ill patients:  140 - 180 mg/dL   Results for COLEEN, CARDIFF (MRN [id number]) as of 02/15/2018 08:46  Ref. Range 02/14/2018 07:45 02/14/2018 11:58 02/14/2018 16:47 02/14/2018 21:18  Glucose-Capillary Latest Ref Range: 70 - 99 mg/dL 294 (H)  11 units NOVOLOG  329 (H)  13 units NOVOLOG  335 (H)  13 units NOVOLOG  394 (H)  5 units NOVOLOG +  12 units LANTUS   Results for REBECAH, DANGERFIELD (MRN [id number]) as of 02/15/2018 08:46  Ref. Range 02/15/2018 08:09  Glucose-Capillary Latest Ref Range: 70 - 99 mg/dL 370 (H)    Home DM Meds: Trulicity 1.5 mg Qweek                             Jardiance 10 mg Daily                             Novolog 6 units TID with meals                             Lantus 50 units AM/ 30 units QHS  Current Orders: Lantus 12 units QHS                           Novolog Sensitive Correction Scale/ SSI (0-9 units) TID AC + HS                           Novolog 6 units TID with meals     Trulicity 1.5 mg QSunday      Getting Solumedrol 60 mg Q6 hours.  CBGs elevated likely due to steroids.     MD- Patient takes much larger dose of Lantus at home per records (50 units AM/ 30 units PM for total of 80 units per day).  Please changing Lantus to the following:  Lantus 25 units AM/ 15 units QHS (50% total home dose)     --Will follow patient during hospitalization--  Wyn Quaker RN, MSN, CDE Diabetes Coordinator Inpatient Glycemic Control Team Team Pager: [phone] (8a-5p)

## 2018-02-15 NOTE — Plan of Care (Signed)
  Problem: Clinical Measurements: Goal: Respiratory complications will improve Outcome: Progressing Note:  Continues on 4L O2 unable to wean   Problem: Activity: Goal: Risk for activity intolerance will decrease Outcome: Progressing Note:  Up with standby assist to bathroom, tolerating well   Problem: Elimination: Goal: Will not experience complications related to urinary retention Outcome: Progressing Note:  Receiving IV lasix   Problem: Pain Managment: Goal: General experience of comfort will improve Outcome: Progressing Note:  No complaints of pain this shift   Problem: Safety: Goal: Ability to remain free from injury will improve Outcome: Progressing   Problem: Skin Integrity: Goal: Risk for impaired skin integrity will decrease Outcome: Progressing   Problem: Education: Goal: Ability to demonstrate management of disease process will improve Outcome: Progressing Note:  Reinforced pt cannot drink whatever she wants, that she needs to limit her fluid intake with heart failure.    Problem: Nutrition: Goal: Adequate nutrition will be maintained Outcome: Completed/Met   Problem: Coping: Goal: Level of anxiety will decrease Outcome: Completed/Met

## 2018-02-15 NOTE — Discharge Summary (Signed)
[employer] [facility] [place] [facility], 58 y.o., DOB 1960-10-18, MRN [id number]. Admission date: 02/12/2018 Discharge Date 02/15/2018 Primary MD Gennette Pac, FNP Admitting Physician Gladstone Lighter, MD  Admission Diagnosis  Acute pulmonary edema ([place]) [J81.0] COPD exacerbation ([place]) [J44.1] Acute respiratory failure with hypoxia ([place]) [J96.01]  Discharge Diagnosis   Active Problems: Acute on chronic COPD exacerbation Acute on chronic diastolic CHF Left lower extremity cellulitis Chronic kidney disease stage IV Diabetes type 2 Hypertension      Hospital Course Emily [name] a58 y.o.femalewith a known history of COPD on 2 L home oxygen, hypertension, hyperlipidemia, obesity, insulin-dependent diabetes mellitus, hypothyroidism presents to hospital secondary to worsening shortness of breath over the past week.  Patient was evaluated in the ED and noted to have CHF exacerbation as well as COPD exacerbation.  She was treated with nebs steroids.  For CHF she was treated Lasix. Patient's breathing is improved.  She did recently have sleep study done outpatient in January.  At that however has not had CPAP set up.  I try to get case manager to see if this could be arranged if not she will need to follow-up with the pulmonologist to have this arranged.          Consults  none Significant Tests:  See full reports for all details     Dg Chest Portable 1 View  Result Date: 02/12/2018 CLINICAL DATA:  58 year old female with history of difficulty breathing today. EXAM: PORTABLE CHEST 1 VIEW COMPARISON:  Chest x-ray 11/12/2017. FINDINGS: Lung volumes are low. No consolidative airspace disease. No pleural effusions. No pneumothorax. No definite suspicious appearing pulmonary nodules or masses are noted. Cephalization of the pulmonary vasculature, without frank pulmonary edema. Mild cardiomegaly. Upper mediastinal contours are within normal limits.  IMPRESSION: 1. Cardiomegaly with pulmonary venous congestion, but no frank pulmonary edema. Electronically Signed   By: Vinnie Langton M.D.   On: 02/12/2018 17:30       Today   Subjective:   Emily [name] patient doing much better breathing improved Objective:   Blood pressure 132/79, pulse 86, temperature 98.3 F (36.8 C), temperature source Oral, resp. rate 18, height 5\' 4"  (1.626 m), weight 108.8 kg, SpO2 96 %.  .  Intake/Output Summary (Last 24 hours) at 02/15/2018 1411 Last data filed at 02/15/2018 1246 Gross per 24 hour  Intake 976 ml  Output 4000 ml  Net -3024 ml    Exam VITAL SIGNS: Blood pressure 132/79, pulse 86, temperature 98.3 F (36.8 C), temperature source Oral, resp. rate 18, height 5\' 4"  (1.626 m), weight 108.8 kg, SpO2 96 %.  GENERAL:  58 y.o.-year-old patient lying in the bed with no acute distress.  EYES: Pupils equal, round, reactive to light and accommodation. No scleral icterus. Extraocular muscles intact.  HEENT: Head atraumatic, normocephalic. Oropharynx and nasopharynx clear.  NECK:  Supple, no jugular venous distention. No thyroid enlargement, no tenderness.  LUNGS: Normal breath sounds bilaterally, no wheezing, rales,rhonchi or crepitation. No use of accessory muscles of respiration.  CARDIOVASCULAR: S1, S2 normal. No murmurs, rubs, or gallops.  ABDOMEN: Soft, nontender, nondistended. Bowel sounds present. No organomegaly or mass.  EXTREMITIES: No pedal edema, cyanosis, or clubbing.  NEUROLOGIC: Cranial nerves II through XII are intact. Muscle strength 5/5 in all extremities. Sensation intact. Gait not checked.  PSYCHIATRIC: The patient is alert and oriented x 3.  SKIN: No obvious rash, lesion, or ulcer.   Data Review     CBC w Diff:  Lab Results  Component Value Date   WBC 4.2 02/13/2018   HGB 14.9 02/13/2018   HGB 13.2 05/14/2016   HCT 50.5 (H) 02/13/2018   HCT 39.2 05/14/2016   PLT 198 02/13/2018   PLT 183 05/14/2016   LYMPHOPCT  27 02/12/2018   LYMPHOPCT 34.7 04/23/2013   MONOPCT 9 02/12/2018   MONOPCT 11 06/02/2013   MONOPCT 8.1 04/23/2013   EOSPCT 1 02/12/2018   EOSPCT 1.3 04/23/2013   BASOPCT 0 02/12/2018   BASOPCT 0.8 04/23/2013   CMP:  Lab Results  Component Value Date   NA 135 02/14/2018   NA 142 05/14/2016   NA 140 06/02/2013   K 4.4 02/14/2018   K 3.5 06/02/2013   CL 99 02/14/2018   CL 107 06/02/2013   CO2 27 02/14/2018   CO2 26 06/02/2013   BUN 43 (H) 02/14/2018   BUN 27 (H) 05/14/2016   BUN 14 06/02/2013   CREATININE 2.46 (H) 02/14/2018   CREATININE 1.51 (H) 05/24/2017   GLU 201 01/31/2015   PROT 7.2 11/12/2017   PROT 6.5 05/14/2016   PROT 7.7 06/02/2013   ALBUMIN 3.2 (L) 11/12/2017   ALBUMIN 3.6 05/14/2016   ALBUMIN 3.0 (L) 06/02/2013   BILITOT 0.8 11/12/2017   BILITOT 0.3 05/14/2016   BILITOT 0.3 06/02/2013   ALKPHOS 98 11/12/2017   ALKPHOS 127 (H) 06/02/2013   AST 18 11/12/2017   AST 21 06/02/2013   ALT 17 11/12/2017   ALT 27 06/02/2013  .  Micro Results No results found for this or any previous visit (from the past 240 hour(s)).      Code Status Orders  (From admission, onward)         Start     Ordered   02/12/18 2241  Full code  Continuous     02/12/18 2240        Code Status History    Date Active Date Inactive Code Status Order ID Comments User Context   11/12/2017 1802 11/18/2017 2140 Full Code [id number]  Loletha Grayer, MD ED   04/27/2016 2025 04/28/2016 1954 Full Code [id number]  Demetrios Loll, MD Inpatient   01/15/2016 0358 01/16/2016 1752 Full Code [id number]  Harvie Bridge, DO Inpatient          Follow-up Information    [facility] Follow up on 02/21/2018.   Specialty:  Cardiology Why:  at 9:00am Contact information: [address] [place] [phone]       Gennette Pac, [place] On 02/25/2018.   Specialty:  Family Medicine Why:  Appointment Time: @  9:40 Contact information: [place] [address] [phone]        Ottie Glazier, MD Follow up in 1 week(s).   Specialty:  Pulmonary Disease Why:  sleep apnea Contact information: [place] [address] [phone]           Discharge Medications   Allergies as of 02/15/2018   No Known Allergies     Medication List    STOP taking these medications   levofloxacin 250 MG tablet Commonly known as:  LEVAQUIN   methimazole 5 MG tablet Commonly known as:  TAPAZOLE     TAKE these medications   albuterol 108 (90 Base) MCG/ACT inhaler Commonly known as:  PROVENTIL HFA;VENTOLIN HFA Inhale 1-2 puffs into the lungs every 6 (six) hours as needed for wheezing or shortness of breath.   albuterol (2.5 MG/3ML) 0.083% nebulizer solution Commonly  known as:  PROVENTIL Take 3 mLs (2.5 mg total) by nebulization every 6 (six) hours as needed for wheezing or shortness of breath.   amLODipine 10 MG tablet Commonly known as:  NORVASC Take 10 mg by mouth daily.   aspirin EC 81 MG tablet Take 81 mg by mouth daily.   atorvastatin 40 MG tablet Commonly known as:  LIPITOR TAKE ONE TABLET BY MOUTH EVERY DAY What changed:  when to take this   budesonide-formoterol 80-4.5 MCG/ACT inhaler Commonly known as:  SYMBICORT Inhale 2 puffs into the lungs 2 (two) times daily.   carvedilol 25 MG tablet Commonly known as:  COREG TAKE 1 TABLET BY MOUTH TWICE DAILY   cephALEXin 250 MG capsule Commonly known as:  KEFLEX Take 1 capsule (250 mg total) by mouth every 8 (eight) hours for 3 days.   cyclobenzaprine 5 MG tablet Commonly known as:  FLEXERIL TAKE ONE TABLET BY MOUTH EVERY DAY AS NEEDED FOR MUSCLE SPASMS What changed:  See the new instructions.   Dulaglutide 1.5 MG/0.5ML Sopn Commonly known as:  TRULICITY Inject 1.5 mg into the skin once a week. What changed:  when to take this   empagliflozin 10 MG Tabs tablet Commonly known as:   JARDIANCE Take 10 mg by mouth daily. Before b'fast What changed:    when to take this  additional instructions   furosemide 40 MG tablet Commonly known as:  LASIX Take 1 tablet (40 mg total) by mouth 2 (two) times daily.   insulin aspart cartridge Commonly known as:  NOVOLOG PENFILL Inject 6 Units into the skin 3 (three) times daily with meals. Can substitute any short acting insulin that is covered in a pen   Insulin Glargine 100 UNIT/ML Solostar Pen Commonly known as:  LANTUS SOLOSTAR Inject under skin 50 units in am and 30 units at night What changed:    how much to take  how to take this  when to take this  additional instructions   lisinopril 5 MG tablet Commonly known as:  PRINIVIL,ZESTRIL Take 5 mg by mouth daily. for high blood pressure   predniSONE 10 MG (21) Tbpk tablet Commonly known as:  STERAPRED UNI-PAK 21 TAB Start 60mg  taper by 10mg  until finish   tiotropium 18 MCG inhalation capsule Commonly known as:  SPIRIVA Place 18 mcg into inhaler and inhale daily.            Durable Medical Equipment  (From admission, onward)         Start     Ordered   02/15/18 1244  DME Oxygen  Once    Question Answer Comment  Mode or (Route) Nasal cannula   Liters per Minute 4   Oxygen delivery system Gas      02/15/18 1243   02/15/18 1244  For home use only DME continuous positive airway pressure (CPAP)  Once    Question Answer Comment  Patient has OSA or probable OSA Yes   Is the patient currently using CPAP in the home No   If yes (to question two) Determine DME provider and inform them of any new orders/settings   Settings 11-15   Signs and symptoms of probable OSA  (select all that apply) Gasping during sleep   Signs and symptoms of probable OSA  (select all that apply) Witnessed apneas   CPAP supplies needed Mask, headgear, cushions, filters, heated tubing and water chamber      02/15/18 1244  Total Time in preparing paper work,  data evaluation and todays exam - 7 minutes  Dustin Flock M.D on 02/15/2018 at 2:11 [place]  [phone]

## 2018-02-15 NOTE — Progress Notes (Signed)
[place] at [facility]                                                                                                                                                                                  Patient Demographics   Katerina Zurn, is a 58 y.o. female, DOB - 1960-03-18, NTI:[id number]  Admit date - 02/12/2018   Admitting Physician Gladstone Lighter, MD  Outpatient Primary MD for the patient is Gennette Pac, FNP   LOS - 3  Subjective: Plan was patient to be discharged home however oxygen dropped to 89 with 4 L  Review of Systems:   CONSTITUTIONAL: No documented fever. No fatigue, weakness. No weight gain, no weight loss.  EYES: No blurry or double vision.  ENT: No tinnitus. No postnasal drip. No redness of the oropharynx.  RESPIRATORY: No cough, no wheeze, no hemoptysis.  Positive dyspnea.  CARDIOVASCULAR: No chest pain. No orthopnea. No palpitations. No syncope.  GASTROINTESTINAL: No nausea, no vomiting or diarrhea. No abdominal pain. No melena or hematochezia.  GENITOURINARY: No dysuria or hematuria.  ENDOCRINE: No polyuria or nocturia. No heat or cold intolerance.  HEMATOLOGY: No anemia. No bruising. No bleeding.  INTEGUMENTARY: No rashes. No lesions.  MUSCULOSKELETAL: No arthritis. No swelling. No gout.  NEUROLOGIC: No numbness, tingling, or ataxia. No seizure-type activity.  PSYCHIATRIC: No anxiety. No insomnia. No ADD.    Vitals:   Vitals:   02/14/18 2019 02/15/18 0417 02/15/18 0811 02/15/18 1434  BP:  125/72 132/79   Pulse:  88 86   Resp:   18   Temp:  98.3 F (36.8 C)    TempSrc:  Oral    SpO2: 90% 93% 96% 90%  Weight:  108.8 kg    Height:        Wt Readings from Last 3 Encounters:  02/15/18 108.8 kg  12/30/17 98.9 kg  11/12/17 99.8 kg     Intake/Output Summary (Last 24 hours) at 02/15/2018 1434 Last data filed at 02/15/2018 1246 Gross per 24 hour  Intake 976 ml  Output 4000 ml  Net -3024 ml    Physical Exam:    GENERAL: Pleasant-appearing in no apparent distress.  HEAD, EYES, EARS, NOSE AND THROAT: Atraumatic, normocephalic. Extraocular muscles are intact. Pupils equal and reactive to light. Sclerae anicteric. No conjunctival injection. No oro-pharyngeal erythema.  NECK: Supple. There is no jugular venous distention. No bruits, no lymphadenopathy, no thyromegaly.  HEART: Regular rate and rhythm,. No murmurs, no rubs, no clicks.  LUNGS: No occasional crackles at the base  aBDOMEN: Soft, flat, nontender, nondistended. Has good bowel sounds. No hepatosplenomegaly appreciated.  EXTREMITIES: No evidence of any cyanosis, clubbing, or peripheral  edema.  +2 pedal and radial pulses bilaterally.  NEUROLOGIC: The patient is alert, awake, and oriented x3 with no focal motor or sensory deficits appreciated bilaterally.  SKIN: Moist and warm with no rashes appreciated.  Psych: Not anxious, depressed LN: No inguinal LN enlargement    Antibiotics   Anti-infectives (From admission, onward)   Start     Dose/Rate Route Frequency Ordered Stop   02/15/18 0000  cephALEXin (KEFLEX) 250 MG capsule     250 mg Oral Every 8 hours 02/15/18 1243 02/18/18 2359   02/12/18 2330  cephALEXin (KEFLEX) capsule 250 mg     250 mg Oral Every 8 hours 02/12/18 2322     02/12/18 2245  cephALEXin (KEFLEX) capsule 500 mg  Status:  Discontinued     500 mg Oral Every 8 hours 02/12/18 2240 02/12/18 2321      Medications   Scheduled Meds: . amLODipine  10 mg Oral Daily  . aspirin EC  81 mg Oral Daily  . atorvastatin  40 mg Oral QHS  . budesonide (PULMICORT) nebulizer solution  0.25 mg Nebulization BID  . carvedilol  25 mg Oral BID  . cephALEXin  250 mg Oral Q8H  . docusate sodium  100 mg Oral BID  . Dulaglutide  1.5 mg Subcutaneous Q Sun  . furosemide  40 mg Intravenous Q12H  . heparin injection (subcutaneous)  5,000 Units Subcutaneous Q8H  . insulin aspart  0-5 Units Subcutaneous QHS  . insulin aspart  0-9 Units Subcutaneous  TID WC  . insulin aspart  6 Units Subcutaneous TID WC  . insulin glargine  12 Units Subcutaneous QHS  . ipratropium-albuterol  3 mL Nebulization Q6H  . mouth rinse  15 mL Mouth Rinse BID  . methimazole  5 mg Oral Daily  . methylPREDNISolone (SOLU-MEDROL) injection  60 mg Intravenous Q6H   Continuous Infusions: PRN Meds:.acetaminophen **OR** acetaminophen, ondansetron **OR** ondansetron (ZOFRAN) IV, polyethylene glycol   Data Review:   Micro Results No results found for this or any previous visit (from the past 240 hour(s)).  Radiology Reports Dg Chest Portable 1 View  Result Date: 02/12/2018 CLINICAL DATA:  58 year old female with history of difficulty breathing today. EXAM: PORTABLE CHEST 1 VIEW COMPARISON:  Chest x-ray 11/12/2017. FINDINGS: Lung volumes are low. No consolidative airspace disease. No pleural effusions. No pneumothorax. No definite suspicious appearing pulmonary nodules or masses are noted. Cephalization of the pulmonary vasculature, without frank pulmonary edema. Mild cardiomegaly. Upper mediastinal contours are within normal limits. IMPRESSION: 1. Cardiomegaly with pulmonary venous congestion, but no frank pulmonary edema. Electronically Signed   By: Vinnie Langton M.D.   On: 02/12/2018 17:30     CBC Recent Labs  Lab 02/12/18 1714 02/13/18 0447  WBC 6.5 4.2  HGB 15.5* 14.9  HCT 52.9* 50.5*  PLT 205 198  MCV 96.4 94.9  MCH 28.2 28.0  MCHC 29.3* 29.5*  RDW 13.4 13.5  LYMPHSABS 1.8  --   MONOABS 0.6  --   EOSABS 0.1  --   BASOSABS 0.0  --     Chemistries  Recent Labs  Lab 02/12/18 1714 02/13/18 0447 02/14/18 0507  NA 140 141 135  K 4.1 4.8 4.4  CL 104 107 99  CO2 30 27 27   GLUCOSE 51* 252* 293*  BUN 27* 29* 43*  CREATININE 2.47* 2.49* 2.46*  CALCIUM 8.3* 8.2* 8.3*   ------------------------------------------------------------------------------------------------------------------ estimated creatinine clearance is 29.7 mL/min (A) (by C-G  formula based on SCr of 2.46 mg/dL (H)). ------------------------------------------------------------------------------------------------------------------  Recent Labs    02/12/18 2247  HGBA1C 6.7*   ------------------------------------------------------------------------------------------------------------------ No results for input(s): CHOL, HDL, LDLCALC, TRIG, CHOLHDL, LDLDIRECT in the last 72 hours. ------------------------------------------------------------------------------------------------------------------ No results for input(s): TSH, T4TOTAL, T3FREE, THYROIDAB in the last 72 hours.  Invalid input(s): FREET3 ------------------------------------------------------------------------------------------------------------------ No results for input(s): VITAMINB12, FOLATE, FERRITIN, TIBC, IRON, RETICCTPCT in the last 72 hours.  Coagulation profile No results for input(s): INR, PROTIME in the last 168 hours.  No results for input(s): DDIMER in the last 72 hours.  Cardiac Enzymes Recent Labs  Lab 02/12/18 2247 02/13/18 0447 02/13/18 1058  TROPONINI 0.04* 0.03* 0.16*   ------------------------------------------------------------------------------------------------------------------ Invalid input(s): POCBNP    Assessment & Plan   Kalayla Shadden  is a 58 y.o. female with a known history of COPD on 2 L home oxygen, hypertension, hyperlipidemia, obesity, insulin-dependent diabetes mellitus, hypothyroidism presents to hospital secondary to worsening shortness of breath over the past week.  1.  Acute on chronic COPD exacerbation- continue therapy with DuoNeb Solu-Medrol, continue Pulmicort I will obtain a CT scan due to persistent hypoxia  2.  CHF exacerbation-likely acute diastolic.  Last echo with normal EF. -Continue IV Lasix -Cardiogram with diastolic dysfunction  3.  Left lower extremity cellulitis-started with skin breakdown and lower extremity edema.  No fevers.   Normal WBC.  Tender and erythematous.    Continue oral Keflex  4.  CKD stage IV-monitor while on Lasix.  Avoid nephrotoxins  5.  Diabetes mellitus-continue home medications.    Blood sugars continues to be elevated due to steroid Increase Lantus  6.  Hypertension-on Coreg, Norvasc.  Lasix is added so monitor closely  7.  DVT prophylaxis-subcutaneous heparin      Code Status Orders  (From admission, onward)         Start     Ordered   02/12/18 2241  Full code  Continuous     02/12/18 2240        Code Status History    Date Active Date Inactive Code Status Order ID Comments User Context   11/12/2017 1802 11/18/2017 2140 Full Code [id number]  Loletha Grayer, MD ED   04/27/2016 2025 04/28/2016 1954 Full Code [id number]  Demetrios Loll, MD Inpatient   01/15/2016 0358 01/16/2016 1752 Full Code [id number]  Hugelmeyer, Ubaldo Glassing, DO Inpatient           Consults 40min DVT Prophylaxis continue heparin  Lab Results  Component Value Date   PLT 198 02/13/2018     Time Spent in minutes   35 minutes  Greater than 50% of time spent in care coordination and counseling patient regarding the condition and plan of care.   Dustin Flock M.D on 02/15/2018 at 2:34 PM  Between 7am to 6pm - Pager - [phone]  After 6pm go to [email or web address] - Proofreader  [employer]   Office  [phone]

## 2018-02-16 LAB — PROCALCITONIN: Procalcitonin: <0.1 ng/mL

## 2018-02-16 LAB — GLUCOSE, CAPILLARY
Glucose-Capillary: 322 mg/dL — ABNORMAL HIGH (ref 70–99)
Glucose-Capillary: 423 mg/dL — ABNORMAL HIGH (ref 70–99)
Glucose-Capillary: 366 mg/dL — ABNORMAL HIGH (ref 70–99)
Glucose-Capillary: 327 mg/dL — ABNORMAL HIGH (ref 70–99)

## 2018-02-16 MED ORDER — GUAIFENESIN ER 600 MG PO TB12
600.0000 mg | ORAL_TABLET | Freq: Two times a day (BID) | ORAL | Status: DC
  Administered 2018-02-16 – 2018-02-18 (×5): 600 mg via ORAL
  Filled 2018-02-16 (×5): qty 1

## 2018-02-16 MED ORDER — INSULIN GLARGINE 100 UNIT/ML ~~LOC~~ SOLN
20.0000 [IU] | Freq: Every day | SUBCUTANEOUS | Status: DC
  Administered 2018-02-16 – 2018-02-17 (×2): 20 [IU] via SUBCUTANEOUS
  Filled 2018-02-16 (×3): qty 0.2

## 2018-02-16 MED ORDER — LEVOFLOXACIN IN D5W 500 MG/100ML IV SOLN
500.0000 mg | INTRAVENOUS | Status: DC
  Administered 2018-02-16 – 2018-02-18 (×3): 500 mg via INTRAVENOUS
  Filled 2018-02-16 (×4): qty 100

## 2018-02-16 MED ORDER — INSULIN GLARGINE 100 UNIT/ML ~~LOC~~ SOLN
30.0000 [IU] | Freq: Every morning | SUBCUTANEOUS | Status: DC
  Administered 2018-02-16 – 2018-02-17 (×2): 30 [IU] via SUBCUTANEOUS
  Filled 2018-02-16 (×6): qty 0.3

## 2018-02-16 NOTE — Progress Notes (Signed)
[place] at [facility]                                                                                                                                                                                  Patient Demographics   Emily [name], is a 58 y.o. female, DOB - 23-Jul-1960, ZOX:[id number]  Admit date - 02/12/2018   Admitting Physician Gladstone Lighter, MD  Outpatient Primary MD for the patient is Gennette Pac, FNP   LOS - 4  Subjective: Patient requiring 5 L oxygen denies any coughing  Review of Systems:   CONSTITUTIONAL: No documented fever. No fatigue, weakness. No weight gain, no weight loss.  EYES: No blurry or double vision.  ENT: No tinnitus. No postnasal drip. No redness of the oropharynx.  RESPIRATORY: No cough, no wheeze, no hemoptysis.  Positive dyspnea.  CARDIOVASCULAR: No chest pain. No orthopnea. No palpitations. No syncope.  GASTROINTESTINAL: No nausea, no vomiting or diarrhea. No abdominal pain. No melena or hematochezia.  GENITOURINARY: No dysuria or hematuria.  ENDOCRINE: No polyuria or nocturia. No heat or cold intolerance.  HEMATOLOGY: No anemia. No bruising. No bleeding.  INTEGUMENTARY: No rashes. No lesions.  MUSCULOSKELETAL: No arthritis. No swelling. No gout.  NEUROLOGIC: No numbness, tingling, or ataxia. No seizure-type activity.  PSYCHIATRIC: No anxiety. No insomnia. No ADD.    Vitals:   Vitals:   02/16/18 0232 02/16/18 0349 02/16/18 0513 02/16/18 0800  BP:  132/86  136/79  Pulse:  83  87  Resp:  (!) 22    Temp:  97.6 F (36.4 C)  98.7 F (37.1 C)  TempSrc:  Oral  Oral  SpO2: 92% 91%  92%  Weight:   108.1 kg   Height:        Wt Readings from Last 3 Encounters:  02/16/18 108.1 kg  12/30/17 98.9 kg  11/12/17 99.8 kg     Intake/Output Summary (Last 24 hours) at 02/16/2018 1438 Last data filed at 02/16/2018 1411 Gross per 24 hour  Intake 0 ml  Output 1875 ml  Net -1875 ml    Physical Exam:    GENERAL: Pleasant-appearing in no apparent distress.  HEAD, EYES, EARS, NOSE AND THROAT: Atraumatic, normocephalic. Extraocular muscles are intact. Pupils equal and reactive to light. Sclerae anicteric. No conjunctival injection. No oro-pharyngeal erythema.  NECK: Supple. There is no jugular venous distention. No bruits, no lymphadenopathy, no thyromegaly.  HEART: Regular rate and rhythm,. No murmurs, no rubs, no clicks.  LUNGS: No occasional crackles at the base  aBDOMEN: Soft, flat, nontender, nondistended. Has good bowel sounds. No hepatosplenomegaly appreciated.  EXTREMITIES: No evidence of any cyanosis, clubbing, or peripheral edema.  +2  pedal and radial pulses bilaterally.  NEUROLOGIC: The patient is alert, awake, and oriented x3 with no focal motor or sensory deficits appreciated bilaterally.  SKIN: Moist and warm with no rashes appreciated.  Psych: Not anxious, depressed LN: No inguinal LN enlargement    Antibiotics   Anti-infectives (From admission, onward)   Start     Dose/Rate Route Frequency Ordered Stop   02/16/18 1000  levofloxacin (LEVAQUIN) IVPB 500 mg     500 mg 100 mL/hr over 60 Minutes Intravenous Every 24 hours 02/16/18 0935     02/15/18 0000  cephALEXin (KEFLEX) 250 MG capsule     250 mg Oral Every 8 hours 02/15/18 1243 02/18/18 2359   02/12/18 2330  cephALEXin (KEFLEX) capsule 250 mg  Status:  Discontinued     250 mg Oral Every 8 hours 02/12/18 2322 02/16/18 0935   02/12/18 2245  cephALEXin (KEFLEX) capsule 500 mg  Status:  Discontinued     500 mg Oral Every 8 hours 02/12/18 2240 02/12/18 2321      Medications   Scheduled Meds: . amLODipine  10 mg Oral Daily  . aspirin EC  81 mg Oral Daily  . atorvastatin  40 mg Oral QHS  . budesonide (PULMICORT) nebulizer solution  0.25 mg Nebulization BID  . carvedilol  25 mg Oral BID  . docusate sodium  100 mg Oral BID  . Dulaglutide  1.5 mg Subcutaneous Q Sun  . furosemide  40 mg Intravenous Q12H  . guaiFENesin   600 mg Oral BID  . heparin injection (subcutaneous)  5,000 Units Subcutaneous Q8H  . insulin aspart  0-5 Units Subcutaneous QHS  . insulin aspart  0-9 Units Subcutaneous TID WC  . insulin aspart  6 Units Subcutaneous TID WC  . insulin glargine  20 Units Subcutaneous QHS  . insulin glargine  30 Units Subcutaneous q morning - 10a  . ipratropium-albuterol  3 mL Nebulization Q6H  . mouth rinse  15 mL Mouth Rinse BID  . methimazole  5 mg Oral Daily  . methylPREDNISolone (SOLU-MEDROL) injection  60 mg Intravenous Q6H   Continuous Infusions: . levofloxacin (LEVAQUIN) IV 500 mg (02/16/18 1007)   PRN Meds:.acetaminophen **OR** acetaminophen, ondansetron **OR** ondansetron (ZOFRAN) IV, polyethylene glycol   Data Review:   Micro Results No results found for this or any previous visit (from the past 240 hour(s)).  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 EXAM: CT CHEST WITHOUT CONTRAST TECHNIQUE: Multidetector CT imaging of the chest was performed following the standard protocol without IV contrast. COMPARISON:  11/15/2017, 01/14/2016 and multiple prior chest x-rays most recently 02/12/2018. FINDINGS: Cardiovascular: Stable moderate cardiomegaly. Mild LAD coronary atherosclerosis. No pericardial effusion. Minimal aortic annular calcification. Mild atherosclerosis involving the proximal abdominal aorta. No evidence of aortic aneurysm. Bovine aortic arch anatomy (LEFT common carotid artery arises from the innominate artery). Mediastinum/Nodes: No pathologic lymphadenopathy. Normal appearing esophagus. Marked enlargement of the visualized thyroid gland which causes mild narrowing of the trachea, unchanged, without evidence of nodularity. Lungs/Pleura: Airspace consolidation with associated air bronchograms involving the posterior basal segment of the  RIGHT LOWER LOBE. Linear atelectasis involving the superior segment RIGHT LOWER LOBE and the lingula. Stable scarring involving the RIGHT MIDDLE LOBE. No evidence of interstitial pulmonary edema. No pleural effusions. Central airways patent without significant bronchial wall thickening. Upper Abdomen: Surgically absent gallbladder. Visualized upper abdomen unremarkable for the  unenhanced technique. Musculoskeletal: DISH involving the thoracic spine. No acute findings. IMPRESSION: 1. Pneumonia involving the posterior basal segment of the RIGHT lower lobe. 2. Linear atelectasis involving the superior segment RIGHT LOWER LOBE and the lingula. 3. Stable cardiomegaly without evidence of pulmonary edema. Electronically Signed   By: Evangeline Dakin M.D.   On: 02/15/2018 15:54   Dg Chest Portable 1 View  Result Date: 02/12/2018 CLINICAL DATA:  58 year old female with history of difficulty breathing today. EXAM: PORTABLE CHEST 1 VIEW COMPARISON:  Chest x-ray 11/12/2017. FINDINGS: Lung volumes are low. No consolidative airspace disease. No pleural effusions. No pneumothorax. No definite suspicious appearing pulmonary nodules or masses are noted. Cephalization of the pulmonary vasculature, without frank pulmonary edema. Mild cardiomegaly. Upper mediastinal contours are within normal limits. IMPRESSION: 1. Cardiomegaly with pulmonary venous congestion, but no frank pulmonary edema. Electronically Signed   By: Vinnie Langton M.D.   On: 02/12/2018 17:30     CBC Recent Labs  Lab 02/12/18 1714 02/13/18 0447  WBC 6.5 4.2  HGB 15.5* 14.9  HCT 52.9* 50.5*  PLT 205 198  MCV 96.4 94.9  MCH 28.2 28.0  MCHC 29.3* 29.5*  RDW 13.4 13.5  LYMPHSABS 1.8  --   MONOABS 0.6  --   EOSABS 0.1  --   BASOSABS 0.0  --     Chemistries  Recent Labs  Lab 02/12/18 1714 02/13/18 0447 02/14/18 0507  NA 140 141 135  K 4.1 4.8 4.4  CL 104 107 99  CO2 30 27 27   GLUCOSE 51* 252* 293*  BUN 27* 29* 43*  CREATININE 2.47*  2.49* 2.46*  CALCIUM 8.3* 8.2* 8.3*   ------------------------------------------------------------------------------------------------------------------ estimated creatinine clearance is 29.6 mL/min (A) (by C-G formula based on SCr of 2.46 mg/dL (H)). ------------------------------------------------------------------------------------------------------------------ No results for input(s): HGBA1C in the last 72 hours. ------------------------------------------------------------------------------------------------------------------ No results for input(s): CHOL, HDL, LDLCALC, TRIG, CHOLHDL, LDLDIRECT in the last 72 hours. ------------------------------------------------------------------------------------------------------------------ No results for input(s): TSH, T4TOTAL, T3FREE, THYROIDAB in the last 72 hours.  Invalid input(s): FREET3 ------------------------------------------------------------------------------------------------------------------ No results for input(s): VITAMINB12, FOLATE, FERRITIN, TIBC, IRON, RETICCTPCT in the last 72 hours.  Coagulation profile No results for input(s): INR, PROTIME in the last 168 hours.  No results for input(s): DDIMER in the last 72 hours.  Cardiac Enzymes Recent Labs  Lab 02/12/18 2247 02/13/18 0447 02/13/18 1058  TROPONINI 0.04* 0.03* 0.16*   ------------------------------------------------------------------------------------------------------------------ Invalid input(s): POCBNP    Assessment & Plan   Emily [name]  is a 58 y.o. female with a known history of COPD on 2 L home oxygen, hypertension, hyperlipidemia, obesity, insulin-dependent diabetes mellitus, hypothyroidism presents to hospital secondary to worsening shortness of breath over the past week.  1.  Acute on chronic COPD exacerbation- continue therapy with DuoNeb Solu-Medrol, continue Pulmicort CT scan suggestive of pneumonia we will treat with IV Levaquin  2.  CHF  exacerbation-likely acute diastolic.  Last echo with normal EF. -Continue IV Lasix -Cardiogram with diastolic dysfunction  3.  Left lower extremity cellulitis-she will be on IV Levaquin discontinue Ceftin  4.  CKD stage IV-monitor while on Lasix.  Avoid nephrotoxins  5.  Diabetes mellitus-continue home medications.    Blood sugars continues to be elevated due to steroid Lantus has been changed to twice daily per diabetes recommendation  6.  Hypertension-on Coreg, Norvasc.  Lasix is added so monitor closely  7.  DVT prophylaxis-subcutaneous heparin      Code Status Orders  (From admission, onward)  Start     Ordered   02/12/18 2241  Full code  Continuous     02/12/18 2240        Code Status History    Date Active Date Inactive Code Status Order ID Comments User Context   11/12/2017 1802 11/18/2017 2140 Full Code [id number]  Loletha Grayer, MD ED   04/27/2016 2025 04/28/2016 1954 Full Code [id number]  Demetrios Loll, MD Inpatient   01/15/2016 0358 01/16/2016 1752 Full Code [id number]  Hugelmeyer, Ubaldo Glassing, DO Inpatient           Consults 76min DVT Prophylaxis continue heparin  Lab Results  Component Value Date   PLT 198 02/13/2018     Time Spent in minutes   35 minutes  Greater than 50% of time spent in care coordination and counseling patient regarding the condition and plan of care.   Dustin Flock M.D on 02/16/2018 at 2:38 PM  Between 7am to 6pm - Pager - [phone]  After 6pm go to [email or web address] - Proofreader  [employer]   Office  [phone]

## 2018-02-16 NOTE — Progress Notes (Addendum)
CRITICAL VALUE ALERT  Critical Value:  CBG 423  Date & Time Notied:  02/16/18.1210  Provider Notified: Dr. Dustin Flock  Orders Received/Actions taken: give SSI+meal coverage

## 2018-02-16 NOTE — Plan of Care (Signed)
Nutrition Education Note  RD consulted for nutrition education regarding CHF.  58 y.o. female with a known history of COPD on 2 L home oxygen, hypertension, hyperlipidemia, obesity, insulin-dependent diabetes mellitus, hypothyroidism presents to hospital secondary to worsening shortness of breath over the past week.  Met with pt in room today. Pt reports good appetite and oral intake at baseline. Pt reports eating 100% of her breakfast today. Per chart, pt with weight gain secondary to edema. Pt asking multiple questions about why she can not drink water. RD explained fluid restrictions and made pt aware that all beverages count towards fluids. Explained how fluid intake effects her current health state and why restrictions are needed.   RD provided "Low Sodium Nutrition Therapy" handout from the Academy of [employer]. Reviewed patient's dietary recall. Provided examples on ways to decrease sodium intake in diet. Discouraged intake of processed foods and use of salt shaker. Encouraged fresh fruits and vegetables as well as whole grain sources of carbohydrates to maximize fiber intake.   RD discussed why it is important for patient to adhere to diet recommendations, and emphasized the role of fluids, foods to avoid, and importance of weighing self daily. Teach back method used.  Expect poor compliance.  Body mass index is 40.9 kg/m. Pt meets criteria for morbid obesity based on current BMI.  Current diet order is HH, patient is consuming approximately 100% of meals at this time. Labs and medications reviewed. No further nutrition interventions warranted at this time. RD contact information provided. If additional nutrition issues arise, please re-consult RD.   Koleen Distance MS, RD, LDN Pager #- [phone] Office#- [phone] After Hours Pager: [phone]

## 2018-02-16 NOTE — Plan of Care (Signed)
Continues on 5L and CPAP at night, continued to educate on diet, dietician consult placed  Problem: Activity: Goal: Risk for activity intolerance will decrease Outcome: Progressing Note:  Up to [facility] with standby assist   Problem: Elimination: Goal: Will not experience complications related to bowel motility Outcome: Progressing Note:  Pt did have BM this morning 02/16/2018 Goal: Will not experience complications related to urinary retention Outcome: Progressing   Problem: Pain Managment: Goal: General experience of comfort will improve Outcome: Progressing Note:  No complaints of pain this shift   Problem: Safety: Goal: Ability to remain free from injury will improve Outcome: Progressing   Problem: Cardiac: Goal: Ability to achieve and maintain adequate cardiopulmonary perfusion will improve Outcome: Progressing Note:  Receiving IV lasix

## 2018-02-16 NOTE — Progress Notes (Signed)
Inpatient Diabetes Program Recommendations  AACE/ADA: New Consensus Statement on Inpatient Glycemic Control  Target Ranges:  Prepandial:   less than 140 mg/dL      Peak postprandial:   less than 180 mg/dL (1-2 hours)      Critically ill patients:  140 - 180 mg/dL   Results for MAREN, WIESEN (MRN [id number]) as of 02/16/2018 09:53  Ref. Range 02/15/2018 08:09 02/15/2018 11:33 02/15/2018 16:42 02/15/2018 20:53 02/16/2018 07:50  Glucose-Capillary Latest Ref Range: 70 - 99 mg/dL 370 (H)  Novolog 15 units 436 (H)  Novolog 20 units 427 (H)  Novolog 20 units 380 (H)  Novolog 5 units  Lantus 12 units 322 (H)  Novolog 13 units   Review of Glycemic Control  Diabetes history: DM2 Outpatient Diabetes medications: Trulicity 1.5 mg Qweek (Sunday), Jardiance 10 mg QAM, Lantus 50 units QAM, Lantus 30 units QHS, Novolog 6 units TID with meals  Current orders for Inpatient glycemic control: Lantus 12 units QHS, Novolog 0-9 units TID with meals, Novolog 0-5 units QHS, Novolog 6 units TID with meals for meal coverage, Trulicity 1.5 Qweek; Solumedrol 60 mg Q6H  Inpatient Diabetes Program Recommendations:   Insulin - Basal: Please change Lantus to 30 units QAM (starting now) and Lantus 20 units QHS.  Thanks, Barnie Alderman, RN, MSN, CDE Diabetes Coordinator Inpatient Diabetes Program [phone] (Team Pager from 8am to 5pm)

## 2018-02-17 LAB — CBC
HCT: 53.6 % — ABNORMAL HIGH (ref 36.0–46.0)
Hemoglobin: 16.5 g/dL — ABNORMAL HIGH (ref 12.0–15.0)
MCH: 28.3 pg (ref 26.0–34.0)
MCHC: 30.8 g/dL (ref 30.0–36.0)
MCV: 91.8 fL (ref 80.0–100.0)
Platelets: 198 10*3/uL (ref 150–400)
RBC: 5.84 MIL/uL — ABNORMAL HIGH (ref 3.87–5.11)
RDW: 13 % (ref 11.5–15.5)
WBC: 7.7 10*3/uL (ref 4.0–10.5)
nRBC: 0.3 % — ABNORMAL HIGH (ref 0.0–0.2)

## 2018-02-17 LAB — GLUCOSE, CAPILLARY
Glucose-Capillary: 319 mg/dL — ABNORMAL HIGH (ref 70–99)
Glucose-Capillary: 342 mg/dL — ABNORMAL HIGH (ref 70–99)
Glucose-Capillary: 366 mg/dL — ABNORMAL HIGH (ref 70–99)
Glucose-Capillary: 320 mg/dL — ABNORMAL HIGH (ref 70–99)

## 2018-02-17 MED ORDER — METHYLPREDNISOLONE SODIUM SUCC 125 MG IJ SOLR
60.0000 mg | Freq: Two times a day (BID) | INTRAMUSCULAR | Status: DC
  Administered 2018-02-17 – 2018-02-18 (×2): 60 mg via INTRAVENOUS
  Filled 2018-02-17 (×2): qty 2

## 2018-02-17 MED ORDER — ACETYLCYSTEINE 20 % IN SOLN: 600.0000 mg | Freq: Two times a day (BID) | RESPIRATORY_TRACT | Status: DC

## 2018-02-17 MED ORDER — ACETYLCYSTEINE 20 % IN SOLN
4.0000 mL | Freq: Two times a day (BID) | RESPIRATORY_TRACT | Status: DC
  Administered 2018-02-17 – 2018-02-18 (×2): 4 mL via RESPIRATORY_TRACT
  Filled 2018-02-17 (×4): qty 4

## 2018-02-17 NOTE — Plan of Care (Signed)
  Problem: Clinical Measurements: Goal: Respiratory complications will improve Outcome: Progressing Note:  Continues on 5L O2 wearing CPAP at night   Problem: Activity: Goal: Risk for activity intolerance will decrease Outcome: Progressing Note:  Up to [facility] with standby assist   Problem: Elimination: Goal: Will not experience complications related to urinary retention Outcome: Progressing   Problem: Pain Managment: Goal: General experience of comfort will improve Outcome: Progressing Note:  No complaints of pain this shift   Problem: Safety: Goal: Ability to remain free from injury will improve Outcome: Progressing   Problem: Skin Integrity: Goal: Risk for impaired skin integrity will decrease Outcome: Progressing   Problem: Cardiac: Goal: Ability to achieve and maintain adequate cardiopulmonary perfusion will improve Outcome: Progressing Note:  Receiving IV lasix

## 2018-02-17 NOTE — Progress Notes (Signed)
Inpatient Diabetes Program Recommendations  AACE/ADA: New Consensus Statement on Inpatient Glycemic Control   Target Ranges:  Prepandial:   less than 140 mg/dL      Peak postprandial:   less than 180 mg/dL (1-2 hours)      Critically ill patients:  140 - 180 mg/dL   Results for Emily [name], Emily [name] (MRN [id number]) as of 02/17/2018 09:19  Ref. Range 02/16/2018 07:50 02/16/2018 11:55 02/16/2018 16:35 02/16/2018 21:33 02/17/2018 07:28  Glucose-Capillary Latest Ref Range: 70 - 99 mg/dL 322 (H) 423 (H) 366 (H) 327 (H) 319 (H)   Review of Glycemic Control  Diabetes history: DM2 Outpatient Diabetes medications: Trulicity 1.5 mg Qweek (Sunday), Jardiance 10 mg QAM, Lantus 50 units QAM, Lantus 30 units QHS, Novolog 6 units TID with meals  Current orders for Inpatient glycemic control: Lantus 30 units QAM, Lantus 20 units QHS, Novolog 0-9 units TID with meals, Novolog 0-5 units QHS, Novolog 6 units TID with meals for meal coverage, Trulicity 1.5 Qweek; Solumedrol 60 mg Q6H  Inpatient Diabetes Program Recommendations:    Insulin - Basal: Please increase Lantus to 40 units QAM (starting now) and Lantus 30 units QHS.  Insulin-meal coverage: If steroids are continued, please consider increasing meal coverage to Novolog 12 units TID with meals.  Thanks, Barnie Alderman, RN, MSN, CDE Diabetes Coordinator Inpatient Diabetes Program [phone] (Team Pager from 8am to 5pm)

## 2018-02-17 NOTE — Care Management (Signed)
Severe COPD and end stage emphysema; chronic respiratory failure with hypoxemia.  Patient continues to exhibit signs of hypercapnia associated with chronic respiratory failure secondary to severe COPD.  Patient requires the use of NIV both QHS and daytime to help with exacerbation periods.  The use of NIV will treat the patient's high PCO2 levels and can reduce risk of exacerbations and future hospitalizations when used at night and during the day.  Patient will need these advanced settings in conjunction with his current medication regimen; BIPAP with backup rate has been tried and failed.  Failure to have NIV available for use over a 24 hr period could lead to death.  Patient has been able to protect their airway and clear their own secretions.Geoffry Paradise, [place]

## 2018-02-17 NOTE — Care Management Note (Signed)
Case Management Note  Patient Details  Name: Emily [name] MRN: [id number] Date of Birth: 09/13/1960  Subjective/Objective:        Medicare.Gov list provided; Referral to teresa with [facility] for RN, PT, aide and ReDS Vest.  Will notify Helene Kelp when patient discharges.           Action/Plan:   Expected Discharge Date:  02/15/18               Expected Discharge Plan:  [place]  In-House Referral:     Discharge planning Services  CM Consult, HF Clinic  Post Acute Care Choice:  Home Health Choice offered to:  Patient  DME Arranged:    DME Agency:     HH Arranged:  RN, PT, Nurse's Aide(ReDS Vest) [place] Agency:  [place] [facility] (now [facility])  Status of Service:  Completed, signed off  If discussed at [place] of Stay Meetings, dates discussed:    Additional Comments:  Elza Rafter, RN 02/17/2018, 1:52 PM

## 2018-02-17 NOTE — Plan of Care (Addendum)
  Problem: Education: Goal: Knowledge of General Education information will improve Description Including pain rating scale, medication(s)/side effects and non-pharmacologic comfort measures Outcome: Progressing   Pt ambulated around nurses station O2 sats remained at 93% on 4 L N/C, drop to 87% on 3 L, pt on 4 L N/C currently. No complains of sob, pt has been ambulating back and forth to bathroom in no distress, educated pt on fluid restriction with CHF, low sodium/carb mod diet, per pt she has watched EMI videos with mother this admission.  Problem: Activity: Goal: Risk for activity intolerance will decrease Outcome: Progressing   Problem: Cardiac: Goal: Ability to achieve and maintain adequate cardiopulmonary perfusion will improve Outcome: Not Progressing

## 2018-02-17 NOTE — Progress Notes (Signed)
[place] at [facility]                                                                                                                                                                                  Patient Demographics   Emily [name], is a 58 y.o. female, DOB - [birth year], NWG:[id number]  Admit date - 02/12/2018   Admitting Physician Gladstone Lighter, MD  Outpatient Primary MD for the patient is Gennette Pac, FNP   LOS - 5  Subjective: Patient's oxygen requirement is improved at 4 L, coughing more  Review of Systems:   CONSTITUTIONAL: No documented fever. No fatigue, weakness. No weight gain, no weight loss.  EYES: No blurry or double vision.  ENT: No tinnitus. No postnasal drip. No redness of the oropharynx.  RESPIRATORY: No cough, no wheeze, no hemoptysis.  Positive dyspnea.  CARDIOVASCULAR: No chest pain. No orthopnea. No palpitations. No syncope.  GASTROINTESTINAL: No nausea, no vomiting or diarrhea. No abdominal pain. No melena or hematochezia.  GENITOURINARY: No dysuria or hematuria.  ENDOCRINE: No polyuria or nocturia. No heat or cold intolerance.  HEMATOLOGY: No anemia. No bruising. No bleeding.  INTEGUMENTARY: No rashes. No lesions.  MUSCULOSKELETAL: No arthritis. No swelling. No gout.  NEUROLOGIC: No numbness, tingling, or ataxia. No seizure-type activity.  PSYCHIATRIC: No anxiety. No insomnia. No ADD.    Vitals:   Vitals:   02/17/18 0216 02/17/18 0350 02/17/18 0733 02/17/18 1057  BP:  132/86 (!) 154/93   Pulse:  88 85   Resp:  16  18  Temp:  98.3 F (36.8 C) 97.9 F (36.6 C)   TempSrc:  Oral Oral   SpO2: 93% 92% 94% 90%  Weight:  107.5 kg    Height:        Wt Readings from Last 3 Encounters:  02/17/18 107.5 kg  12/30/17 98.9 kg  11/12/17 99.8 kg     Intake/Output Summary (Last 24 hours) at 02/17/2018 1345 Last data filed at 02/17/2018 0900 Gross per 24 hour  Intake 360 ml  Output 4150 ml  Net -3790 ml     Physical Exam:   GENERAL: Pleasant-appearing in no apparent distress.  HEAD, EYES, EARS, NOSE AND THROAT: Atraumatic, normocephalic. Extraocular muscles are intact. Pupils equal and reactive to light. Sclerae anicteric. No conjunctival injection. No oro-pharyngeal erythema.  NECK: Supple. There is no jugular venous distention. No bruits, no lymphadenopathy, no thyromegaly.  HEART: Regular rate and rhythm,. No murmurs, no rubs, no clicks.  LUNGS: No occasional crackles at the base  aBDOMEN: Soft, flat, nontender, nondistended. Has good bowel sounds. No hepatosplenomegaly appreciated.  EXTREMITIES: No evidence of any cyanosis, clubbing, or peripheral edema.  +  2 pedal and radial pulses bilaterally.  NEUROLOGIC: The patient is alert, awake, and oriented x3 with no focal motor or sensory deficits appreciated bilaterally.  SKIN: Moist and warm with no rashes appreciated.  Psych: Not anxious, depressed LN: No inguinal LN enlargement    Antibiotics   Anti-infectives (From admission, onward)   Start     Dose/Rate Route Frequency Ordered Stop   02/16/18 1000  levofloxacin (LEVAQUIN) IVPB 500 mg     500 mg 100 mL/hr over 60 Minutes Intravenous Every 24 hours 02/16/18 0935     02/15/18 0000  cephALEXin (KEFLEX) 250 MG capsule     250 mg Oral Every 8 hours 02/15/18 1243 02/18/18 2359   02/12/18 2330  cephALEXin (KEFLEX) capsule 250 mg  Status:  Discontinued     250 mg Oral Every 8 hours 02/12/18 2322 02/16/18 0935   02/12/18 2245  cephALEXin (KEFLEX) capsule 500 mg  Status:  Discontinued     500 mg Oral Every 8 hours 02/12/18 2240 02/12/18 2321      Medications   Scheduled Meds: . acetylcysteine  4 mL Nebulization BID  . amLODipine  10 mg Oral Daily  . aspirin EC  81 mg Oral Daily  . atorvastatin  40 mg Oral QHS  . budesonide (PULMICORT) nebulizer solution  0.25 mg Nebulization BID  . carvedilol  25 mg Oral BID  . docusate sodium  100 mg Oral BID  . Dulaglutide  1.5 mg Subcutaneous  Q Sun  . furosemide  40 mg Intravenous Q12H  . guaiFENesin  600 mg Oral BID  . heparin injection (subcutaneous)  5,000 Units Subcutaneous Q8H  . insulin aspart  0-5 Units Subcutaneous QHS  . insulin aspart  0-9 Units Subcutaneous TID WC  . insulin aspart  6 Units Subcutaneous TID WC  . insulin glargine  20 Units Subcutaneous QHS  . insulin glargine  30 Units Subcutaneous q morning - 10a  . ipratropium-albuterol  3 mL Nebulization Q6H  . mouth rinse  15 mL Mouth Rinse BID  . methimazole  5 mg Oral Daily  . methylPREDNISolone (SOLU-MEDROL) injection  60 mg Intravenous Q6H   Continuous Infusions: . levofloxacin (LEVAQUIN) IV 500 mg (02/17/18 1011)   PRN Meds:.acetaminophen **OR** acetaminophen, ondansetron **OR** ondansetron (ZOFRAN) IV, polyethylene glycol   Data Review:   Micro Results No results found for this or any previous visit (from the past 240 hour(s)).  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 EXAM: CT CHEST WITHOUT CONTRAST TECHNIQUE: Multidetector CT imaging of the chest was performed following the standard protocol without IV contrast. COMPARISON:  11/15/2017, 01/14/2016 and multiple prior chest x-rays most recently 02/12/2018. FINDINGS: Cardiovascular: Stable moderate cardiomegaly. Mild LAD coronary atherosclerosis. No pericardial effusion. Minimal aortic annular calcification. Mild atherosclerosis involving the proximal abdominal aorta. No evidence of aortic aneurysm. Bovine aortic arch anatomy (LEFT common carotid artery arises from the innominate artery). Mediastinum/Nodes: No pathologic lymphadenopathy. Normal appearing esophagus. Marked enlargement of the visualized thyroid gland which causes mild narrowing of the trachea, unchanged, without evidence of nodularity. Lungs/Pleura: Airspace consolidation with associated  air bronchograms involving the posterior basal segment of the RIGHT LOWER LOBE. Linear atelectasis involving the superior segment RIGHT LOWER LOBE and the lingula. Stable scarring involving the RIGHT MIDDLE LOBE. No evidence of interstitial pulmonary edema. No pleural effusions. Central airways patent without significant bronchial wall thickening. Upper Abdomen:  Surgically absent gallbladder. Visualized upper abdomen unremarkable for the unenhanced technique. Musculoskeletal: DISH involving the thoracic spine. No acute findings. IMPRESSION: 1. Pneumonia involving the posterior basal segment of the RIGHT lower lobe. 2. Linear atelectasis involving the superior segment RIGHT LOWER LOBE and the lingula. 3. Stable cardiomegaly without evidence of pulmonary edema. Electronically Signed   By: Evangeline Dakin M.D.   On: 02/15/2018 15:54   Dg Chest Portable 1 View  Result Date: 02/12/2018 CLINICAL DATA:  58 year old female with history of difficulty breathing today. EXAM: PORTABLE CHEST 1 VIEW COMPARISON:  Chest x-ray 11/12/2017. FINDINGS: Lung volumes are low. No consolidative airspace disease. No pleural effusions. No pneumothorax. No definite suspicious appearing pulmonary nodules or masses are noted. Cephalization of the pulmonary vasculature, without frank pulmonary edema. Mild cardiomegaly. Upper mediastinal contours are within normal limits. IMPRESSION: 1. Cardiomegaly with pulmonary venous congestion, but no frank pulmonary edema. Electronically Signed   By: Vinnie Langton M.D.   On: 02/12/2018 17:30     CBC Recent Labs  Lab 02/12/18 1714 02/13/18 0447 02/17/18 0455  WBC 6.5 4.2 7.7  HGB 15.5* 14.9 16.5*  HCT 52.9* 50.5* 53.6*  PLT 205 198 198  MCV 96.4 94.9 91.8  MCH 28.2 28.0 28.3  MCHC 29.3* 29.5* 30.8  RDW 13.4 13.5 13.0  LYMPHSABS 1.8  --   --   MONOABS 0.6  --   --   EOSABS 0.1  --   --   BASOSABS 0.0  --   --     Chemistries  Recent Labs  Lab 02/12/18 1714 02/13/18 0447  02/14/18 0507  NA 140 141 135  K 4.1 4.8 4.4  CL 104 107 99  CO2 30 27 27   GLUCOSE 51* 252* 293*  BUN 27* 29* 43*  CREATININE 2.47* 2.49* 2.46*  CALCIUM 8.3* 8.2* 8.3*   ------------------------------------------------------------------------------------------------------------------ estimated creatinine clearance is 29.5 mL/min (A) (by C-G formula based on SCr of 2.46 mg/dL (H)). ------------------------------------------------------------------------------------------------------------------ No results for input(s): HGBA1C in the last 72 hours. ------------------------------------------------------------------------------------------------------------------ No results for input(s): CHOL, HDL, LDLCALC, TRIG, CHOLHDL, LDLDIRECT in the last 72 hours. ------------------------------------------------------------------------------------------------------------------ No results for input(s): TSH, T4TOTAL, T3FREE, THYROIDAB in the last 72 hours.  Invalid input(s): FREET3 ------------------------------------------------------------------------------------------------------------------ No results for input(s): VITAMINB12, FOLATE, FERRITIN, TIBC, IRON, RETICCTPCT in the last 72 hours.  Coagulation profile No results for input(s): INR, PROTIME in the last 168 hours.  No results for input(s): DDIMER in the last 72 hours.  Cardiac Enzymes Recent Labs  Lab 02/12/18 2247 02/13/18 0447 02/13/18 1058  TROPONINI 0.04* 0.03* 0.16*   ------------------------------------------------------------------------------------------------------------------ Invalid input(s): POCBNP    Assessment & Plan   Emily [name]  is a 58 y.o. female with a known history of COPD on 2 L home oxygen, hypertension, hyperlipidemia, obesity, insulin-dependent diabetes mellitus, hypothyroidism presents to hospital secondary to worsening shortness of breath over the past week.  1.  Acute on chronic COPD  exacerbation- continue therapy with DuoNeb Solu-Medrol, continue Pulmicort CT scan suggestive of pneumonia continue IV Levaquin  2.  CHF exacerbation-likely acute diastolic.  Last echo with normal EF. -Continue IV Lasix -Echo cardiogram with diastolic dysfunction  3.  Left lower extremity cellulitis-she will be on IV Levaquin discontinue Ceftin  4.  CKD stage IV-monitor while on Lasix.  Avoid nephrotoxins  5.  Diabetes mellitus-continue home medications.    Blood sugars continues to be elevated due to steroid Continue Lantus  6.  Hypertension-on Coreg, Norvasc.  Lasix is added so monitor closely  7.  DVT prophylaxis-subcutaneous heparin      Code Status Orders  (From admission, onward)         Start     Ordered   02/12/18 2241  Full code  Continuous     02/12/18 2240        Code Status History    Date Active Date Inactive Code Status Order ID Comments User Context   11/12/2017 1802 11/18/2017 2140 Full Code [id number]  Loletha Grayer, MD ED   04/27/2016 2025 04/28/2016 1954 Full Code [id number]  Demetrios Loll, MD Inpatient   01/15/2016 0358 01/16/2016 1752 Full Code [id number]  Hugelmeyer, Ubaldo Glassing, DO Inpatient           Consults 68min DVT Prophylaxis continue heparin  Lab Results  Component Value Date   PLT 198 02/17/2018     Time Spent in minutes   35 minutes  Greater than 50% of time spent in care coordination and counseling patient regarding the condition and plan of care.   Dustin Flock M.D on 02/17/2018 at 1:45 PM  Between 7am to 6pm - Pager - [phone]  After 6pm go to [email or web address] - Proofreader  [employer]   Office  [phone]

## 2018-02-17 NOTE — Progress Notes (Signed)
Bedside Spirometry performed with patient per order.  Predicted FEV1= 1.40L                                 Attempt #1       Attempt #2         Attempt #3  FEV1   0.87     0.9       0.87   %  Predicted  62%    64%        62%

## 2018-02-18 LAB — GLUCOSE, CAPILLARY
Glucose-Capillary: 211 mg/dL — ABNORMAL HIGH (ref 70–99)
Glucose-Capillary: 322 mg/dL — ABNORMAL HIGH (ref 70–99)

## 2018-02-18 LAB — BLOOD GAS, ARTERIAL
Acid-Base Excess: 7.5 mmol/L — ABNORMAL HIGH (ref 0.0–2.0)
Bicarbonate: 33.2 mmol/L — ABNORMAL HIGH (ref 20.0–28.0)
FIO2: 0.36
O2 Saturation: 90.9 %
Patient temperature: 37
pCO2 arterial: 50 mmHg — ABNORMAL HIGH (ref 32.0–48.0)
pH, Arterial: 7.43 (ref 7.350–7.450)
pO2, Arterial: 59 mmHg — ABNORMAL LOW (ref 83.0–108.0)

## 2018-02-18 LAB — CREATININE, SERUM
Creatinine, Ser: 2.46 mg/dL — ABNORMAL HIGH (ref 0.44–1.00)
GFR calc Af Amer: 24 mL/min — ABNORMAL LOW (ref >60)
GFR calc non Af Amer: 21 mL/min — ABNORMAL LOW (ref >60)

## 2018-02-18 NOTE — Care Management (Signed)
Spoke with Brad with [facility].  The patient has been approved for the NIV.  Will be delivered to home.

## 2018-02-18 NOTE — Plan of Care (Signed)
  Problem: Education: Goal: Knowledge of General Education information will improve Description Including pain rating scale, medication(s)/side effects and non-pharmacologic comfort measures Outcome: Progressing   Problem: Health Behavior/Discharge Planning: Goal: Ability to manage health-related needs will improve Outcome: Progressing   Problem: Activity: Goal: Capacity to carry out activities will improve Outcome: Progressing   Problem: Cardiac: Goal: Ability to achieve and maintain adequate cardiopulmonary perfusion will improve Outcome: Progressing

## 2018-02-18 NOTE — Care Management Note (Signed)
Case Management Note  Patient Details  Name: Waneda Klammer MRN: [id number] Date of Birth: [birth year]  Subjective/Objective:    Patient is discharging today to home with home health services through [facility].  Helene Kelp is aware of patient discharge.  Brad with [facility] is arranging to have patient set up with DME.  Patient had sleep study back in January.  She will hopefully obtain a trilogy; if not he will assist her with a CPAP machine.                  Action/Plan:   Expected Discharge Date:  02/18/18               Expected Discharge Plan:  [place]  In-House Referral:     Discharge planning Services  CM Consult, HF Clinic  Post Acute Care Choice:  Home Health Choice offered to:  Patient  DME Arranged:  (CPAP or NIV) DME Agency:  [facility] Arranged:  RN, PT, Nurse's Aide(ReDS Vest) [place]:  [facility] (now [facility])  Status of Service:  Completed, signed off  If discussed at [employer] of Stay Meetings, dates discussed:    Additional Comments:  Elza Rafter, RN 02/18/2018, 1:51 PM

## 2018-02-18 NOTE — Discharge Summary (Signed)
[employer] - [place] at [facility], 58 y.o., DOB 1960-06-24, MRN [id number]. Admission date: 02/12/2018 Discharge Date 02/18/2018 Primary MD Gennette Pac, FNP Admitting Physician Gladstone Lighter, MD  Admission Diagnosis  Acute pulmonary edema ([place]) [J81.0] COPD exacerbation ([place]) [J44.1] Acute respiratory failure with hypoxia ([place]) [J96.01]  Discharge Diagnosis   Active Problems: Acute on chronic COPD exasperation Acute on chronic diastolic CHF Sleep apnea Pneumonia Chronic kidney disease stage IV Diabetes type 2 Hypertension Morbid obesity Left lower extremity cellulitis   Hospital Course [name] a57 y.o.femalewith a known history of COPD on 2 L home oxygen, hypertension, hyperlipidemia, obesity, insulin-dependent diabetes mellitus, hypothyroidism presents to hospital secondary to worsening shortness of breath.  Patient was noted to have acute on chronic COPD exacerbation.  She was also noticed to have acute on chronic diastolic CHF.  Patient also has sleep apnea.  We are trying to arrange for patient to get either trilogy machine or CPAP machine at home.  Patient's breathing is much improved.  She was also noticed to have a pneumonia which was treated with antibiotics.            Consults  None  Significant Tests:  See full reports for all details     Ct Chest Wo Contrast  Result Date: 02/15/2018 CLINICAL DATA:  58 year old with oxygen dependent COPD presenting with progressively worsening shortness of breath over the past week. EXAM: CT CHEST WITHOUT CONTRAST TECHNIQUE: Multidetector CT imaging of the chest was performed following the standard protocol without IV contrast. COMPARISON:  11/15/2017, 01/14/2016 and multiple prior chest x-rays most recently 02/12/2018. FINDINGS: Cardiovascular: Stable moderate cardiomegaly. Mild LAD coronary atherosclerosis. No pericardial effusion. Minimal aortic annular calcification.  Mild atherosclerosis involving the proximal abdominal aorta. No evidence of aortic aneurysm. Bovine aortic arch anatomy (LEFT common carotid artery arises from the innominate artery). Mediastinum/Nodes: No pathologic lymphadenopathy. Normal appearing esophagus. Marked enlargement of the visualized thyroid gland which causes mild narrowing of the trachea, unchanged, without evidence of nodularity. Lungs/Pleura: Airspace consolidation with associated air bronchograms involving the posterior basal segment of the RIGHT LOWER LOBE. Linear atelectasis involving the superior segment RIGHT LOWER LOBE and the lingula. Stable scarring involving the RIGHT MIDDLE LOBE. No evidence of interstitial pulmonary edema. No pleural effusions. Central airways patent without significant bronchial wall thickening. Upper Abdomen: Surgically absent gallbladder. Visualized upper abdomen unremarkable for the unenhanced technique. Musculoskeletal: DISH involving the thoracic spine. No acute findings. IMPRESSION: 1. Pneumonia involving the posterior basal segment of the RIGHT lower lobe. 2. Linear atelectasis involving the superior segment RIGHT LOWER LOBE and the lingula. 3. Stable cardiomegaly without evidence of pulmonary edema. Electronically Signed   By: Evangeline Dakin M.D.   On: 02/15/2018 15:54   Dg Chest Portable 1 View  Result Date: 02/12/2018 CLINICAL DATA:  58 year old female with history of difficulty breathing today. EXAM: PORTABLE CHEST 1 VIEW COMPARISON:  Chest x-ray 11/12/2017. FINDINGS: Lung volumes are low. No consolidative airspace disease. No pleural effusions. No pneumothorax. No definite suspicious appearing pulmonary nodules or masses are noted. Cephalization of the pulmonary vasculature, without frank pulmonary edema. Mild cardiomegaly. Upper mediastinal contours are within normal limits. IMPRESSION: 1. Cardiomegaly with pulmonary venous congestion, but no frank pulmonary edema. Electronically Signed   By: Vinnie Langton M.D.   On: 02/12/2018 17:30       Today   Subjective:   Emily [name] patient doing much better pain under control Objective:   Blood pressure 138/90, pulse 86, temperature 97.7 F (  36.5 C), temperature source Oral, resp. rate 19, height 5\' 4"  (1.626 m), weight 105.3 kg, SpO2 93 %.  .  Intake/Output Summary (Last 24 hours) at 02/18/2018 1536 Last data filed at 02/18/2018 1347 Gross per 24 hour  Intake 960 ml  Output 4200 ml  Net -3240 ml    Exam VITAL SIGNS: Blood pressure 138/90, pulse 86, temperature 97.7 F (36.5 C), temperature source Oral, resp. rate 19, height 5\' 4"  (1.626 m), weight 105.3 kg, SpO2 93 %.  GENERAL:  58 y.o.-year-old patient lying in the bed with no acute distress.  EYES: Pupils equal, round, reactive to light and accommodation. No scleral icterus. Extraocular muscles intact.  HEENT: Head atraumatic, normocephalic. Oropharynx and nasopharynx clear.  NECK:  Supple, no jugular venous distention. No thyroid enlargement, no tenderness.  LUNGS: Normal breath sounds bilaterally, no wheezing, rales,rhonchi or crepitation. No use of accessory muscles of respiration.  CARDIOVASCULAR: S1, S2 normal. No murmurs, rubs, or gallops.  ABDOMEN: Soft, nontender, nondistended. Bowel sounds present. No organomegaly or mass.  EXTREMITIES: No pedal edema, cyanosis, or clubbing.  NEUROLOGIC: Cranial nerves II through XII are intact. Muscle strength 5/5 in all extremities. Sensation intact. Gait not checked.  PSYCHIATRIC: The patient is alert and oriented x 3.  SKIN: No obvious rash, lesion, or ulcer.   Data Review     CBC w Diff:  Lab Results  Component Value Date   WBC 7.7 02/17/2018   HGB 16.5 (H) 02/17/2018   HGB 13.2 05/14/2016   HCT 53.6 (H) 02/17/2018   HCT 39.2 05/14/2016   PLT 198 02/17/2018   PLT 183 05/14/2016   LYMPHOPCT 27 02/12/2018   LYMPHOPCT 34.7 04/23/2013   MONOPCT 9 02/12/2018   MONOPCT 11 06/02/2013   MONOPCT 8.1 04/23/2013    EOSPCT 1 02/12/2018   EOSPCT 1.3 04/23/2013   BASOPCT 0 02/12/2018   BASOPCT 0.8 04/23/2013   CMP:  Lab Results  Component Value Date   NA 135 02/14/2018   NA 142 05/14/2016   NA 140 06/02/2013   K 4.4 02/14/2018   K 3.5 06/02/2013   CL 99 02/14/2018   CL 107 06/02/2013   CO2 27 02/14/2018   CO2 26 06/02/2013   BUN 43 (H) 02/14/2018   BUN 27 (H) 05/14/2016   BUN 14 06/02/2013   CREATININE 2.46 (H) 02/18/2018   CREATININE 1.51 (H) 05/24/2017   GLU 201 01/31/2015   PROT 7.2 11/12/2017   PROT 6.5 05/14/2016   PROT 7.7 06/02/2013   ALBUMIN 3.2 (L) 11/12/2017   ALBUMIN 3.6 05/14/2016   ALBUMIN 3.0 (L) 06/02/2013   BILITOT 0.8 11/12/2017   BILITOT 0.3 05/14/2016   BILITOT 0.3 06/02/2013   ALKPHOS 98 11/12/2017   ALKPHOS 127 (H) 06/02/2013   AST 18 11/12/2017   AST 21 06/02/2013   ALT 17 11/12/2017   ALT 27 06/02/2013  .  Micro Results No results found for this or any previous visit (from the past 240 hour(s)).      Code Status Orders  (From admission, onward)         Start     Ordered   02/12/18 2241  Full code  Continuous     02/12/18 2240        Code Status History    Date Active Date Inactive Code Status Order ID Comments User Context   11/12/2017 1802 11/18/2017 2140 Full Code [id number]  Loletha Grayer, MD ED   04/27/2016 2025 04/28/2016 1954 Full Code [id number]  Bridgett Larsson,  Sheppard Evens, MD Inpatient   01/15/2016 0358 01/16/2016 1752 Full Code [id number]  Harvie Bridge, DO Inpatient          Follow-up Information    [place] On 02/21/2018.   Specialty:  Cardiology Why:  at 9:00am Contact information: [address] [employer] [place] [phone]       Gennette Pac, [place] On 02/25/2018.   Specialty:  Family Medicine Why:  Appointment Time: @ 9:40 Contact information: [place] [address] [phone]        Ottie Glazier, MD On 02/25/2018.   Specialty:   Pulmonary Disease Why:  sleep apnea, Appointment Time: @ 9:00 Contact information: [place] [address] [phone]           Discharge Medications   Allergies as of 02/18/2018   No Known Allergies     Medication List    STOP taking these medications   levofloxacin 250 MG tablet Commonly known as:  LEVAQUIN   methimazole 5 MG tablet Commonly known as:  TAPAZOLE     TAKE these medications   albuterol 108 (90 Base) MCG/ACT inhaler Commonly known as:  PROVENTIL HFA;VENTOLIN HFA Inhale 1-2 puffs into the lungs every 6 (six) hours as needed for wheezing or shortness of breath.   albuterol (2.5 MG/3ML) 0.083% nebulizer solution Commonly known as:  PROVENTIL Take 3 mLs (2.5 mg total) by nebulization every 6 (six) hours as needed for wheezing or shortness of breath.   amLODipine 10 MG tablet Commonly known as:  NORVASC Take 10 mg by mouth daily.   aspirin EC 81 MG tablet Take 81 mg by mouth daily.   atorvastatin 40 MG tablet Commonly known as:  LIPITOR TAKE ONE TABLET BY MOUTH EVERY DAY What changed:  when to take this   budesonide-formoterol 80-4.5 MCG/ACT inhaler Commonly known as:  SYMBICORT Inhale 2 puffs into the lungs 2 (two) times daily.   carvedilol 25 MG tablet Commonly known as:  COREG TAKE 1 TABLET BY MOUTH TWICE DAILY   cephALEXin 250 MG capsule Commonly known as:  KEFLEX Take 1 capsule (250 mg total) by mouth every 8 (eight) hours for 3 days.   cyclobenzaprine 5 MG tablet Commonly known as:  FLEXERIL TAKE ONE TABLET BY MOUTH EVERY DAY AS NEEDED FOR MUSCLE SPASMS What changed:  See the new instructions.   Dulaglutide 1.5 MG/0.5ML Sopn Commonly known as:  TRULICITY Inject 1.5 mg into the skin once a week. What changed:  when to take this   empagliflozin 10 MG Tabs tablet Commonly known as:  JARDIANCE Take 10 mg by mouth daily. Before b'fast What changed:    when to take this  additional instructions   furosemide  40 MG tablet Commonly known as:  LASIX Take 1 tablet (40 mg total) by mouth 2 (two) times daily.   insulin aspart cartridge Commonly known as:  NOVOLOG PENFILL Inject 6 Units into the skin 3 (three) times daily with meals. Can substitute any short acting insulin that is covered in a pen   Insulin Glargine 100 UNIT/ML Solostar Pen Commonly known as:  LANTUS SOLOSTAR Inject under skin 50 units in am and 30 units at night What changed:    how much to take  how to take this  when to take this  additional instructions   lisinopril 5 MG tablet Commonly known as:  PRINIVIL,ZESTRIL Take 5 mg by mouth daily. for high blood pressure  predniSONE 10 MG (21) Tbpk tablet Commonly known as:  STERAPRED UNI-PAK 21 TAB Start 60mg  taper by 10mg  until finish   tiotropium 18 MCG inhalation capsule Commonly known as:  SPIRIVA Place 18 mcg into inhaler and inhale daily.            Durable Medical Equipment  (From admission, onward)         Start     Ordered   02/18/18 1341  DME Oxygen  Once    Question Answer Comment  Mode or (Route) Nasal cannula   Liters per Minute 4   Oxygen delivery system Gas      02/18/18 1340   02/15/18 1244  DME Oxygen  Once    Question Answer Comment  Mode or (Route) Nasal cannula   Liters per Minute 4   Oxygen delivery system Gas      02/15/18 1243   02/15/18 1244  For home use only DME continuous positive airway pressure (CPAP)  Once    Question Answer Comment  Patient has OSA or probable OSA Yes   Is the patient currently using CPAP in the home No   If yes (to question two) Determine DME provider and inform them of any new orders/settings   Settings 11-15   Signs and symptoms of probable OSA  (select all that apply) Gasping during sleep   Signs and symptoms of probable OSA  (select all that apply) Witnessed apneas   CPAP supplies needed Mask, headgear, cushions, filters, heated tubing and water chamber      02/15/18 1244              Total Time in preparing paper work, data evaluation and todays exam - 8 minutes  Dustin Flock M.D on 02/18/2018 at [place]  [phone]

## 2018-02-18 NOTE — Progress Notes (Signed)
Inpatient Diabetes Program Recommendations  AACE/ADA: New Consensus Statement on Inpatient Glycemic Control (2015)  Target Ranges:  Prepandial:   less than 140 mg/dL      Peak postprandial:   less than 180 mg/dL (1-2 hours)      Critically ill patients:  140 - 180 mg/dL   Results for Emily [name], Emily [name] (MRN [id number]) as of 02/18/2018 12:31  Ref. Range 02/17/2018 07:28 02/17/2018 11:59 02/17/2018 16:32 02/17/2018 21:21  Glucose-Capillary Latest Ref Range: 70 - 99 mg/dL 319 (H)  13 units NOVOLOG +  30 units LANTUS given at 10am  342 (H)  13 units NOVOLOG  366 (H)  15 units NOVOLOG  320 (H)  4 units NOVOLOG +  20 units LANTUS given at 10pm    Results for Emily [name], Emily [name] (MRN [id number]) as of 02/18/2018 12:31  Ref. Range 02/18/2018 07:23 02/18/2018 11:28  Glucose-Capillary Latest Ref Range: 70 - 99 mg/dL 211 (H)  9 units NOVOLOG  322 (H)  13 units NOVOLOG      Home DM Meds:Trulicity 1.5 mg Qweek Jardiance 10 mg Daily Novolog 6 units TID with meals Lantus 50 units AM/ 30 units QHS   Current Orders: Lantus 30 units AM/ 20 units QHS       Novolog Sensitive Correction Scale/ SSI (0-9 units) TID AC + HS      Novolog 6 units TID with meals      Trulicity 1.5 mg Qweek     Solumedrol reduced to 60 mg BID yesterday.  CBGs still quite elevated today.    MD- Please consider the following in-hospital insulin adjustments:  1. Increase Lantus to 35 units AM/ 25 units PM  2. Increase Novolog Meal Coverage to: Novolog 8 units TID with meals  (Please add the following Hold Parameters: Hold if pt eats <50% of meal, Hold if pt NPO)    --Will follow patient during hospitalization--  Wyn Quaker RN, MSN, CDE Diabetes Coordinator Inpatient Glycemic Control Team Team Pager: [phone] (8a-5p)

## 2018-02-19 ENCOUNTER — Other Ambulatory Visit: Payer: Self-pay | Admitting: Internal Medicine

## 2018-02-19 DIAGNOSIS — I5032 Chronic diastolic (congestive) heart failure: Secondary | ICD-10-CM

## 2018-02-20 NOTE — Progress Notes (Signed)
Patient ID: Emily [name], female    DOB: Feb 08, 1960, 58 y.o.   MRN: [id number]  HPI  Emily [name] is a 58 y/o female with a history of asthma, DM, hyperlipidemia, HTN, stroke, thyroid disease, COPD, sleep apnea, former tobacco use and chronic heart failure.   Echo report from 02/13/2018 reviewed and showed an EF of 60-65%.   Admitted 02/12/2018 due to acute pulmonary edema along with COPD exacerbation. Initially given IV lasix, nebulizer and steroids. Discharged after 6 days.   She presents today for her initial visit with a chief complaint of minimal fatigue upon moderate exertion. She describes this as chronic in nature having been present for several years. She has associated shortness of breath and pedal edema along with this. She denies any difficulty sleeping, dizziness, abdominal distention, palpitations, chest pain, wheezing, cough or weight gain. Has just started weighing herself.   Past Medical History:  Diagnosis Date  . Asthma   . CHF (congestive heart failure) ([place])   . COPD (chronic obstructive pulmonary disease) ([facility])    on 2L home o2  . Diabetes mellitus without complication ([place])   . Hyperlipidemia   . Hypertension   . Stroke [facility])    Was told at recent hospital visit she has stroke in past  . Thyroid disease   . Thyroid nodule 04/30/2016   Past Surgical History:  Procedure Laterality Date  . arm surgery Left    Family History  Problem Relation Age of Onset  . Diabetes Mother   . Hypertension Mother   . Diabetes Father   . Hypertension Father   . CAD Father   . Diabetes Sister    Social History   Tobacco Use  . Smoking status: Former Smoker    Packs/day: 1.50    Types: Cigarettes    Last attempt to quit: 01/13/2016    Years since quitting: 2.1  . Smokeless tobacco: Never Used  Substance Use Topics  . Alcohol use: No   No Known Allergies Prior to Admission medications   Medication Sig Start Date End Date Taking? Authorizing Provider  albuterol  (PROVENTIL HFA;VENTOLIN HFA) 108 (90 Base) MCG/ACT inhaler Inhale 1-2 puffs into the lungs every 6 (six) hours as needed for wheezing or shortness of breath. 07/16/16  Yes Doles-Johnson, Teah, NP  amLODipine (NORVASC) 10 MG tablet Take 10 mg by mouth daily.   Yes [provider]  aspirin EC 81 MG tablet Take 81 mg by mouth daily.   Yes [provider]  atorvastatin (LIPITOR) 40 MG tablet TAKE ONE TABLET BY MOUTH EVERY DAY Patient taking differently: Take 40 mg by mouth at bedtime.  04/21/16  Yes Tawni Millers, MD  budesonide-formoterol (SYMBICORT) 80-4.5 MCG/ACT inhaler Inhale 2 puffs into the lungs 2 (two) times daily. 11/18/17  Yes Wieting, Richard, MD  carvedilol (COREG) 25 MG tablet TAKE 1 TABLET BY MOUTH TWICE DAILY Patient taking differently: Take 25 mg by mouth 2 (two) times daily.  10/14/17  Yes Philemon Kingdom, MD  cyclobenzaprine (FLEXERIL) 5 MG tablet TAKE ONE TABLET BY MOUTH EVERY DAY AS NEEDED FOR MUSCLE SPASMS Patient taking differently: Take 5 mg by mouth daily as needed for muscle spasms.  04/20/16  Yes Doles-Johnson, Teah, NP  Dulaglutide (TRULICITY) 1.5 TG/2.5WL SOPN Inject 1.5 mg into the skin once a week. Patient taking differently: Inject 1.5 mg into the skin every Sunday.  09/27/17  Yes Philemon Kingdom, MD  empagliflozin (JARDIANCE) 10 MG TABS tablet Take 10 mg by mouth daily. Before  b'fast Patient taking differently: Take 10 mg by mouth daily before breakfast.  09/27/17  Yes Philemon Kingdom, MD  furosemide (LASIX) 40 MG tablet Take 1 tablet (40 mg total) by mouth 2 (two) times daily. 02/15/18 02/15/19 Yes Dustin Flock, MD  insulin aspart (NOVOLOG PENFILL) cartridge Inject 6 Units into the skin 3 (three) times daily with meals. Can substitute any short acting insulin that is covered in a pen 11/18/17  Yes Wieting, Richard, MD  Insulin Glargine (LANTUS SOLOSTAR) 100 UNIT/ML Solostar Pen Inject under skin 50 units in am and 30 units at night Patient taking  differently: Inject 30-50 Units into the skin See admin instructions. 50 units every morning and 30 units at bedtime 05/24/17  Yes Philemon Kingdom, MD  lisinopril (PRINIVIL,ZESTRIL) 5 MG tablet Take 5 mg by mouth daily. for high blood pressure 12/23/17  Yes [provider]  predniSONE (STERAPRED UNI-PAK 21 TAB) 10 MG (21) TBPK tablet Start 60mg  taper by 10mg  until finish 02/15/18  Yes Dustin Flock, MD  tiotropium (SPIRIVA) 18 MCG inhalation capsule Place 18 mcg into inhaler and inhale daily.   Yes [provider]  albuterol (PROVENTIL) (2.5 MG/3ML) 0.083% nebulizer solution Take 3 mLs (2.5 mg total) by nebulization every 6 (six) hours as needed for wheezing or shortness of breath. Patient not taking: Reported on 02/21/2018 11/18/17   Loletha Grayer, MD    Review of Systems  Constitutional: Positive for fatigue (minimal). Negative for appetite change.  HENT: Negative for congestion, postnasal drip and sore throat.   Eyes: Positive for visual disturbance ("blurry vision").  Respiratory: Positive for shortness of breath. Negative for cough and wheezing.   Cardiovascular: Positive for leg swelling. Negative for chest pain and palpitations.  Gastrointestinal: Negative for abdominal distention and abdominal pain.  Endocrine: Negative.   Genitourinary: Negative.   Musculoskeletal: Negative for back pain and neck pain.  Skin: Negative.   Allergic/Immunologic: Negative.   Neurological: Negative for dizziness and light-headedness.  Hematological: Negative for adenopathy. Does not bruise/bleed easily.  Psychiatric/Behavioral: Negative for dysphoric mood and sleep disturbance (sleeping on 2-3 pillows). The patient is not nervous/anxious.     Vitals:   02/21/18 0927  BP: 120/74  Pulse: 88  Resp: 17  Temp: 98.2 F (36.8 C)  TempSrc: Oral  SpO2: 94%  Weight: 216 lb (98 kg)  Height: 5\' 4"  (1.626 m)   Wt Readings from Last 3 Encounters:  02/21/18 216 lb (98 kg)  02/18/18  232 lb 2.3 oz (105.3 kg)  12/30/17 218 lb (98.9 kg)   Lab Results  Component Value Date   CREATININE 2.46 (H) 02/18/2018   CREATININE 2.46 (H) 02/14/2018   CREATININE 2.49 (H) 02/13/2018    Physical Exam Vitals signs and nursing note reviewed.  Constitutional:      Appearance: Normal appearance.  HENT:     Head: Normocephalic and atraumatic.  Neck:     Musculoskeletal: Normal range of motion and neck supple.  Cardiovascular:     Rate and Rhythm: Normal rate and regular rhythm.  Pulmonary:     Effort: Pulmonary effort is normal. No respiratory distress.     Breath sounds: No wheezing or rales.  Abdominal:     General: There is no distension.     Palpations: Abdomen is soft.  Musculoskeletal:        General: No tenderness.     Right lower leg: Edema (trace pitting) present.     Left lower leg: Edema (trace pitting) present.  Skin:  General: Skin is warm and dry.  Neurological:     General: No focal deficit present.     Mental Status: She is alert and oriented to person, place, and time.  Psychiatric:        Mood and Affect: Mood normal.        Behavior: Behavior normal.        Thought Content: Thought content normal.     Assessment & Plan:  1: Chronic heart failure with preserved ejection fraction- - NYHA class II - euvolemic today - weighed herself this morning at home and she was instructed to weigh daily and to call for an overnight weight gain of >2 pounds or a weekly weight gain of >5 pounds - not adding salt and has been using Mrs. Dash for seasoning. Hasn't been reading food labels and I reviewed how to read food labels so that she can keep her daily sodium intake to 2000mg  / day. Written dietary information was given to her about this - BNP 02/12/2018 was 663.0 - PharmD reconciled medications with the patient - says that she's received her flu vaccine for this season  2: HTN- - BP looks good today - sees PCP at [place] from  02/14/2018 reviewed and showed sodium 135, potassium 4.4, creatinine 2.46 and GFR 24  3: COPD- - saw pulmonology Lanney Gins) 12/14/17 - wearing oxygen at 4L around the clock  4: Obstructive sleep apnea- - ? Needs trilogy or CPAP - she will call the company when she gets home as she says that her "mask" isn't working right  5: DM with CKD- - nonfasting glucose in clinic today was 372; she says that she ate cornflakes this morning - A1c 02/12/2018 was 6.7% - saw endocrinology Cruzita Lederer) 12/30/17  Medication bottles were reviewed.  Return in 6 weeks or sooner for any questions/problems before then.

## 2018-02-21 ENCOUNTER — Encounter: Payer: Self-pay | Admitting: Family

## 2018-02-21 ENCOUNTER — Other Ambulatory Visit: Payer: Self-pay

## 2018-02-21 ENCOUNTER — Ambulatory Visit: Payer: Medicaid Other | Attending: Family | Admitting: Family

## 2018-02-21 VITALS — BP 120/74 | HR 88 | Temp 98.2°F | Resp 17 | Ht 64.0 in | Wt 216.0 lb

## 2018-02-21 DIAGNOSIS — Z87891 Personal history of nicotine dependence: Secondary | ICD-10-CM | POA: Diagnosis not present

## 2018-02-21 DIAGNOSIS — Z8673 Personal history of transient ischemic attack (TIA), and cerebral infarction without residual deficits: Secondary | ICD-10-CM | POA: Insufficient documentation

## 2018-02-21 DIAGNOSIS — G4733 Obstructive sleep apnea (adult) (pediatric): Secondary | ICD-10-CM

## 2018-02-21 DIAGNOSIS — R5383 Other fatigue: Secondary | ICD-10-CM | POA: Diagnosis present

## 2018-02-21 DIAGNOSIS — J441 Chronic obstructive pulmonary disease with (acute) exacerbation: Secondary | ICD-10-CM | POA: Diagnosis not present

## 2018-02-21 DIAGNOSIS — E1122 Type 2 diabetes mellitus with diabetic chronic kidney disease: Secondary | ICD-10-CM | POA: Insufficient documentation

## 2018-02-21 DIAGNOSIS — Z9981 Dependence on supplemental oxygen: Secondary | ICD-10-CM | POA: Insufficient documentation

## 2018-02-21 DIAGNOSIS — Z79899 Other long term (current) drug therapy: Secondary | ICD-10-CM | POA: Diagnosis not present

## 2018-02-21 DIAGNOSIS — N189 Chronic kidney disease, unspecified: Secondary | ICD-10-CM | POA: Diagnosis not present

## 2018-02-21 DIAGNOSIS — Z794 Long term (current) use of insulin: Secondary | ICD-10-CM | POA: Insufficient documentation

## 2018-02-21 DIAGNOSIS — N184 Chronic kidney disease, stage 4 (severe): Secondary | ICD-10-CM

## 2018-02-21 DIAGNOSIS — I130 Hypertensive heart and chronic kidney disease with heart failure and stage 1 through stage 4 chronic kidney disease, or unspecified chronic kidney disease: Secondary | ICD-10-CM | POA: Diagnosis not present

## 2018-02-21 DIAGNOSIS — G4730 Sleep apnea, unspecified: Secondary | ICD-10-CM | POA: Insufficient documentation

## 2018-02-21 DIAGNOSIS — J449 Chronic obstructive pulmonary disease, unspecified: Secondary | ICD-10-CM | POA: Insufficient documentation

## 2018-02-21 DIAGNOSIS — E785 Hyperlipidemia, unspecified: Secondary | ICD-10-CM | POA: Insufficient documentation

## 2018-02-21 DIAGNOSIS — E079 Disorder of thyroid, unspecified: Secondary | ICD-10-CM | POA: Diagnosis not present

## 2018-02-21 DIAGNOSIS — I5032 Chronic diastolic (congestive) heart failure: Secondary | ICD-10-CM | POA: Insufficient documentation

## 2018-02-21 DIAGNOSIS — Z8249 Family history of ischemic heart disease and other diseases of the circulatory system: Secondary | ICD-10-CM | POA: Insufficient documentation

## 2018-02-21 DIAGNOSIS — Z7982 Long term (current) use of aspirin: Secondary | ICD-10-CM | POA: Insufficient documentation

## 2018-02-21 DIAGNOSIS — I10 Essential (primary) hypertension: Secondary | ICD-10-CM

## 2018-02-21 LAB — GLUCOSE, CAPILLARY: Glucose-Capillary: 372 mg/dL — ABNORMAL HIGH (ref 70–99)

## 2018-02-21 NOTE — Patient Instructions (Signed)
Continue weighing daily and call for an overnight weight gain of > 2 pounds or a weekly weight gain of >5 pounds. 

## 2018-03-04 ENCOUNTER — Other Ambulatory Visit: Payer: Self-pay

## 2018-03-04 MED ORDER — INSULIN ASPART 100 UNIT/ML FLEXPEN: PEN_INJECTOR | SUBCUTANEOUS | 11 refills | Status: DC

## 2018-04-04 ENCOUNTER — Other Ambulatory Visit: Payer: Self-pay

## 2018-04-05 ENCOUNTER — Encounter: Payer: Self-pay | Admitting: Internal Medicine

## 2018-04-05 ENCOUNTER — Ambulatory Visit (INDEPENDENT_AMBULATORY_CARE_PROVIDER_SITE_OTHER): Payer: Medicaid Other | Admitting: Internal Medicine

## 2018-04-05 VITALS — BP 120/60 | HR 74 | Temp 98.4°F | Ht 64.75 in | Wt 206.0 lb

## 2018-04-05 DIAGNOSIS — E049 Nontoxic goiter, unspecified: Secondary | ICD-10-CM

## 2018-04-05 DIAGNOSIS — E041 Nontoxic single thyroid nodule: Secondary | ICD-10-CM

## 2018-04-05 DIAGNOSIS — E1165 Type 2 diabetes mellitus with hyperglycemia: Secondary | ICD-10-CM

## 2018-04-05 DIAGNOSIS — E1159 Type 2 diabetes mellitus with other circulatory complications: Secondary | ICD-10-CM

## 2018-04-05 DIAGNOSIS — E0590 Thyrotoxicosis, unspecified without thyrotoxic crisis or storm: Secondary | ICD-10-CM

## 2018-04-05 DIAGNOSIS — E040 Nontoxic diffuse goiter: Secondary | ICD-10-CM

## 2018-04-05 LAB — T3, FREE: T3, Free: 3.1 pg/mL (ref 2.3–4.2)

## 2018-04-05 LAB — TSH: TSH: 0.45 u[IU]/mL (ref 0.35–4.50)

## 2018-04-05 LAB — T4, FREE: Free T4: 0.79 ng/dL (ref 0.60–1.60)

## 2018-04-05 NOTE — Progress Notes (Signed)
Patient ID: Emily [name], female   DOB: 05/29/1960, 58 y.o.   MRN: [id number]   HPI  Emily [name] is a 58 y.o.-year-old female, returning for f/u for thyrotoxicosis/goiter and DM2, uncontrolled, insulin-dependent, with complications (CKD stage 3, cerebro-vascular ds. - s/p stroke). Last visit 4 months ago.  She was hospitalized with COPD and CHF exacerbation.  Reviewing the discharge summary, she was taken off methimazole at discharge.  It is unclear why.  TMNG/Graves ds.: Reviewed history: Pt was dx'ed with hyperthyroidism in ~2014 >> started MMI, lately 10 mg 2x daily. She was lost for f/u at [facility] as she could not travel over there.  Thyroid uptake and scan (06/22/2013) showed: FINDINGS: The 24 hour uptake was 24.7%. The normal reference range at 24 hours is 15 to 35%.   The thyroid scan demonstrates symmetric, mildly heterogenous distribution of radiopharmaceutical throughout the thyroid, which is increased in size.  IMPRESSION: Mildly heterogenous iodine uptake, cannot exclude toxic multinodular goiter versus possible mild Graves disease. Consider ultrasound for further correlation.  Patient was on methimazole before, however, this was stopped after  TSH returned very elevated, in the 90s (02/2015) >> TSH decreased to 8, then normalized >> we continued her off MMI.   However,she was started on MMI 10 mg 2x a day (re-Rx by Dr. Rosanna Randy: 04/17/2016, [facility]) - I do not think he was aware that we had stopped MMI.  We stopped methimazole again in 05/2016.  However, we had to restart again methimazole in 05/2017.  She was on 5 mg daily, but this was stopped in the hospital!.  Reviewed her TFTs: Lab Results  Component Value Date   TSH 0.757 11/14/2017   TSH 1.61 09/27/2017   TSH 0.03 (L) 05/24/2017   TSH 0.54 08/20/2016   TSH 2.840 01/14/2016   TSH 3.20 12/10/2015   TSH 7.49 (H) 11/07/2015   TSH 2.35 07/31/2015   TSH 2.57 06/13/2015   TSH 8.090 (H) 04/09/2015   FREET4 0.71 09/27/2017   FREET4 0.92 05/24/2017   FREET4 1.10 08/20/2016   FREET4 0.47 (L) 12/10/2015   FREET4 0.32 (L) 11/07/2015   FREET4 0.62 07/31/2015   FREET4 0.57 (L) 06/13/2015   FREET4 0.30 (L) 04/09/2015   Previously:   Thyroid U/S (05/12/2016): Large isthmic nodule Thyroid tissue is diffusely heterogeneous and enlarged.  The isthmus has a large isoechoic nodular structure. Unclear if this represents a discrete nodule versus focal heterogeneous thyroid tissue. This area measures up to 3.3 cm and recommend biopsy of this area.  Numerous small nodules and cysts throughout the left and right thyroid lobes. These predominantly cystic nodules do not meet criteria for biopsy or dedicated follow-up.  FNA of the isthmic nodule (06/25/2016): Benign  Pt denies: - feeling nodules in neck - hoarseness - dysphagia - choking - SOB with lying down  Pt does have a FH of thyroid ds.: aunt. No FH of thyroid cancer. No h/o radiation tx to head or neck.  No seaweed or kelp. No recent contrast studies. No herbal supplements. No Biotin use. No recent steroids use.   DM2: History of noncompliance with medicines + Complications: History of stroke, also CKD  Reviewed HbA1c levels: Lab Results  Component Value Date   HGBA1C 6.7 (H) 02/12/2018   HGBA1C 7.1 (A) 12/30/2017   HGBA1C 8.6 (A) 09/27/2017   She was on: - Lantus 45 units 2x a day >> was off insulin - "the pharmacy did not receive it" - Januvia 50 mg in am >>  stopped (?) - Glipizide 10 mg 2x a day >> started in the hospital We added Jardiance at last visit >> not taking  - "the pharmacy did not receive it" She was on Metformin >> diarrhea.  Now on: - Trulicity 1.5 mg weekly - Jardiance 10 mg before breakfast - Lantus 50 units in a.m. and 30 units at bedtime - NovoLog 6 units before breakfast and after dinner  She is checking sugars 2 times a day: - am: 70-136, 147, 167, HI >> 73-143, 172, HI - 2h after b'fast:  n/c - lunch: n/c - 2h after lunch: n/c - dinner: n/c >> 80-305, 390 >> n/c >> 141 - 2h after dinner: n/c >> 95-192 - bedtime: 81-188, 209, 310, HI >> 80, 112-211, 239, HI  + CKD; BUN/Cr worse lately: Lab Results  Component Value Date   BUN 43 (H) 02/14/2018   Lab Results  Component Value Date   CREATININE 2.46 (H) 02/18/2018  GFR 24 (02/18/2018) She is now on 5 mg lisinopril.  + HL.  Latest lipid profile: Lab Results  Component Value Date   CHOL 193 05/24/2017   HDL 59.50 05/24/2017   LDLCALC 108 (H) 05/24/2017   TRIG 127.0 05/24/2017   CHOLHDL 3 05/24/2017  On Lipitor 40.  Latest dilated eye exam was in 11/2016: No DR  ROS: Constitutional: no weight gain/no weight loss, no fatigue, no subjective hyperthermia, no subjective hypothermia Eyes: no blurry vision, no xerophthalmia ENT: no sore throat, no nodules palpated in neck, no dysphagia, no odynophagia, no hoarseness Cardiovascular: no CP/no SOB/no palpitations/no leg swelling Respiratory: no cough/no SOB/no wheezing Gastrointestinal: no N/no V/no D/no C/no acid reflux Musculoskeletal: no muscle aches/no joint aches Skin: no rashes, no hair loss Neurological: no tremors/no numbness/no tingling/no dizziness  I reviewed pt's medications, allergies, PMH, social hx, family hx, and changes were documented in the history of present illness. Otherwise, unchanged from my initial visit note.   Past Medical History:  Diagnosis Date  . Asthma   . CHF (congestive heart failure) ([place])   . COPD (chronic obstructive pulmonary disease) ([facility])    on 2L home o2  . Diabetes mellitus without complication ([place])   . Hyperlipidemia   . Hypertension   . Stroke [facility])    Was told at recent hospital visit she has stroke in past  . Thyroid disease   . Thyroid nodule 04/30/2016   Past Surgical History:  Procedure Laterality Date  . arm surgery Left    Social History   Social History  . Marital Status: Single    Spouse Name: N/A   . Number of Children: 1   Occupational History  . None   Social History Main Topics  . Smoking status: Current Every Day Smoker -- 1.00 packs/day  . Smokeless tobacco: Not on file  . Alcohol Use: No  . Drug Use: No   Current Outpatient Medications on File Prior to Visit  Medication Sig Dispense Refill  . albuterol (PROVENTIL HFA;VENTOLIN HFA) 108 (90 Base) MCG/ACT inhaler Inhale 1-2 puffs into the lungs every 6 (six) hours as needed for wheezing or shortness of breath. 1 Inhaler 11  . albuterol (PROVENTIL) (2.5 MG/3ML) 0.083% nebulizer solution Take 3 mLs (2.5 mg total) by nebulization every 6 (six) hours as needed for wheezing or shortness of breath. (Patient not taking: Reported on 02/21/2018) 100 vial 0  . amLODipine (NORVASC) 10 MG tablet Take 10 mg by mouth daily.    Marland Kitchen aspirin EC 81 MG tablet Take  81 mg by mouth daily.    Marland Kitchen atorvastatin (LIPITOR) 40 MG tablet TAKE ONE TABLET BY MOUTH EVERY DAY (Patient taking differently: Take 40 mg by mouth at bedtime. ) 90 tablet PRN  . budesonide-formoterol (SYMBICORT) 80-4.5 MCG/ACT inhaler Inhale 2 puffs into the lungs 2 (two) times daily. 1 Inhaler 12  . carvedilol (COREG) 25 MG tablet TAKE 1 TABLET BY MOUTH TWICE DAILY (Patient taking differently: Take 25 mg by mouth 2 (two) times daily. ) 180 tablet 1  . cyclobenzaprine (FLEXERIL) 5 MG tablet TAKE ONE TABLET BY MOUTH EVERY DAY AS NEEDED FOR MUSCLE SPASMS (Patient taking differently: Take 5 mg by mouth daily as needed for muscle spasms. ) 30 tablet 0  . Dulaglutide (TRULICITY) 1.5 PQ/9.8YM SOPN Inject 1.5 mg into the skin once a week. (Patient taking differently: Inject 1.5 mg into the skin every Sunday. ) 12 pen 3  . empagliflozin (JARDIANCE) 10 MG TABS tablet Take 10 mg by mouth daily. Before b'fast (Patient taking differently: Take 10 mg by mouth daily before breakfast. ) 30 tablet 11  . furosemide (LASIX) 40 MG tablet Take 1 tablet (40 mg total) by mouth 2 (two) times daily. 60 tablet 11  .  insulin aspart (NOVOLOG) 100 UNIT/ML FlexPen Inject 6 units fifteen minutes before breakfast and dinner. 15 mL 11  . Insulin Glargine (LANTUS SOLOSTAR) 100 UNIT/ML Solostar Pen Inject under skin 50 units in am and 30 units at night (Patient taking differently: Inject 30-50 Units into the skin See admin instructions. 50 units every morning and 30 units at bedtime) 10 pen 11  . lisinopril (PRINIVIL,ZESTRIL) 5 MG tablet Take 5 mg by mouth daily. for high blood pressure    . predniSONE (STERAPRED UNI-PAK 21 TAB) 10 MG (21) TBPK tablet Start 60mg  taper by 10mg  until finish 21 tablet 0  . tiotropium (SPIRIVA) 18 MCG inhalation capsule Place 18 mcg into inhaler and inhale daily.     No current facility-administered medications on file prior to visit.    No Known Allergies Family History  Problem Relation Age of Onset  . Diabetes Mother   . Hypertension Mother   . Diabetes Father   . Hypertension Father   . CAD Father   . Diabetes Sister    PE: BP 120/60   Pulse 74   Temp 98.4 F (36.9 C)   Ht 5' 4.75" (1.645 m)   Wt 206 lb (93.4 kg)   SpO2 94%   BMI 34.55 kg/m  Body mass index is 34.55 kg/m.  Wt Readings from Last 3 Encounters:  04/05/18 206 lb (93.4 kg)  02/21/18 216 lb (98 kg)  02/18/18 232 lb 2.3 oz (105.3 kg)   Constitutional: overweight, in NAD Eyes: PERRLA, EOMI, no exophthalmos ENT: moist mucous membranes, +L>R thyromegaly, no cervical lymphadenopathy Cardiovascular: RRR, No MRG, no LE edema Respiratory: CTA B Gastrointestinal: abdomen soft, NT, ND, BS+ Musculoskeletal: no deformities, strength intact in all 4 Skin: moist, warm, no rashes Neurological: no tremor with outstretched hands, DTR normal in all 4  ASSESSMENT: 1. Thyrotoxicosis  2. Large goiter US SOFT TISSUE HEAD AND NECK  Order: [id number]  Status:  Final result Visible to patient:  No (Not Released) Dx:  Goiter diffuse  Details   Reading Physician Reading Date Result Priority  Markus Daft, MD  05/12/2016   Narrative    CLINICAL DATA: Thyromegaly on previous chest CT.  EXAM: THYROID ULTRASOUND  TECHNIQUE: Ultrasound examination of the thyroid gland and adjacent soft tissues was performed.  COMPARISON: Chest CT 01/14/2016  FINDINGS: Parenchymal Echotexture: Moderately heterogenous  Isthmus: 1.2 cm in the AP dimension  Right lobe: 8.4 x 2.8 x 4.5 cm  Left lobe: 8.2 x 3.0 x 3.7 cm  _________________________________________________________  Estimated total number of nodules >/= 1 cm: 6-10  Number of spongiform nodules >/= 2 cm not described below (TR1): 0  Number of mixed cystic and solid nodules >/= 1.5 cm not described below ([place]): 0  _________________________________________________________  Nodule # 1:  Location: Isthmus; Inferior  Maximum size: 3.3 cm; Other 2 dimensions: 1.7 x 2.7 cm  Composition: solid/almost completely solid (2)  Echogenicity: isoechoic (1)  **Given size (>/= 2.5 cm) and appearance, fine needle aspiration of this mildly suspicious nodule should be considered based on TI-RADS criteria.  _________________________________________________________  Mildly complex cystic structure in the superior right thyroid lobe measuring up to 1.0 cm. Minimally complex cyst in the superior right thyroid lobe measuring up to 1.6 cm. Adjacent complex cysts or spongiform nodules in the mid right thyroid lobe, largest measuring 1.1 cm. Spongiform nodule in the mid left thyroid lobe measuring up to 1.3 cm.  Nodule # 2:  Location: Left; Mid  Maximum size: 2.0 cm; Other 2 dimensions: 0.8 x 0.8 cm  Composition: mixed cystic and solid (1)  Echogenicity: cannot determine (1)  This nodule does NOT meet TI-RADS criteria for biopsy or dedicated follow-up.  _________________________________________________________  IMPRESSION: Thyroid tissue is diffusely heterogeneous and enlarged.  The isthmus has a large isoechoic nodular structure. Unclear  if this represents a discrete nodule versus focal heterogeneous thyroid tissue. This area measures up to 3.3 cm and recommend biopsy of this area.  Numerous small nodules and cysts throughout the left and right thyroid lobes. These predominantly cystic nodules do not meet criteria for biopsy or dedicated follow-up.  The above is in keeping with the ACR TI-RADS recommendations - J Am Coll Radiol 2017;14:587-595.   Electronically Signed By: Markus Daft M.D. On: 05/12/2016 12:03        Addendum: Adequacy Reason Satisfactory For Evaluation. Diagnosis THYROID, FINE NEEDLE ASPIRATION ISTHMUS INFERIOR (SPECIMEN 1 OF 1, COLLECTED ON 06/25/2016): CONSISTENT WITH BENIGN FOLLICULAR NODULE ([place] CATEGORY II). Casimer Lanius MD Pathologist, Electronic Signature (Case signed 06/26/2016) Specimen Clinical Information Nodule 1 Isthmus Inferior 3.3 cm; Other 2 dimensions: 1.7 x 2.7 cm, Solid / almost completely solid, Isoechoic, ACR TI-RADS total points: 3, Midly suspicious nodule Source Thyroid, Fine Needle Aspiration, Isthmus Inferior, (Specimen 1 of 1, colected on 06/25/16 )  3. DM2, insulin dependent, uncontrolled  PLAN:  1. And 2.  -She has a history of thyrotoxicosis per thyroid uptake and scan, either due to mild Graves' disease or mild multinodular goiter -She appears euthyroid -She was on methimazole 5 mg daily, however, this was stopped in the hospital (? Why) -Latest TFTs were reviewed and they were normal 11/2017 and we will recheck them today to make sure that she can be off methimazole  2. Large goiter -She has minimum neck compression symptoms, not very bothersome -Reviewed the report of the latest thyroid ultrasound and biopsy results.  The dominant nodule is isthmic, measuring 3.3 cm, and is benign. -We will continue to monitor her clinically  3. DM2 -Patient with longstanding, uncontrolled, type 2 diabetes, on oral medication (Jardiance), weekly GLP-1 receptor  agonist, and basal-bolus insulin regimen.  At last visit, sugars are much improved so we continued the same regimen but I did advise her to take the NovoLog approximately 15 minutes before meals, as she  was taking the doses without the relationship to the meals - Latest HbA1c was reviewed and this was much better, at 6.7% last month. - At this visit, reviewing her detailed sugar log, her sugars are higher after dinner.  She tells me that she is taking NovoLog after dinner and I strongly advised her to move the NovoLog doses 15 minutes before meals. -  Due to the decreased kidney function (GFR 24), we will also need to stop Jardiance, unfortunately.  I will recheck her kidney function today to see if we can restart the medication. - I advised her to: Patient Instructions  Please continue: - Trulicity 1.5 mg weekly - Lantus 50 units in a.m. and 30 units at bedtime - NovoLog 6 units before breakfast and move this before dinner  For now, hold Jardiance.  Please stop at the lab.  Please return in 3-4 months with your sugar log.   - continue checking sugars at different times of the day - check 2x a day, rotating checks - advised for yearly eye exams >> she is not UTD - Return to clinic in 3-4 mo with sugar log       Component     Latest Ref Rng & Units 04/05/2018  Glucose     65 - 99 mg/dL 180 (H)  BUN     7 - 25 mg/dL 33 (H)  Creatinine     0.50 - 1.05 mg/dL 1.87 (H)  GFR, Est Non African American     > OR = 60 mL/min/1.16m2 29 (L)  GFR, Est African American     > OR = 60 mL/min/1.47m2 34 (L)  BUN/Creatinine Ratio     6 - 22 (calc) 18  Sodium     135 - 146 mmol/L 143  Potassium     3.5 - 5.3 mmol/L 3.9  Chloride     98 - 110 mmol/L 105  CO2     20 - 32 mmol/L 28  Calcium     8.6 - 10.4 mg/dL 9.1  TSH     0.35 - 4.50 uIU/mL 0.45  T4,Free(Direct)     0.60 - 1.60 ng/dL 0.79  Triiodothyronine,Free,Serum     2.3 - 4.2 pg/mL 3.1   Kidney function is a little better, with a  GFR improved to 34.  We still need to hold off Jardiance. TFTs normal.  For now, she can stay off methimazole but will need to check her thyroid tests again at next visit.  CC: PCP: Gennette Pac, [place]   Philemon Kingdom, MD PhD [facility] Endocrinology

## 2018-04-05 NOTE — Patient Instructions (Addendum)
Please continue: - Trulicity 1.5 mg weekly - Lantus 50 units in a.m. and 30 units at bedtime - NovoLog 6 units before breakfast and move this before dinner  For now, hold Jardiance.  Please stop at the lab.  Please return in 3-4 months with your sugar log.

## 2018-04-06 ENCOUNTER — Ambulatory Visit: Payer: Medicaid Other | Admitting: Family

## 2018-04-06 LAB — BASIC METABOLIC PANEL WITH GFR
BUN/Creatinine Ratio: 18 (calc) (ref 6–22)
BUN: 33 mg/dL — ABNORMAL HIGH (ref 7–25)
CO2: 28 mmol/L (ref 20–32)
Calcium: 9.1 mg/dL (ref 8.6–10.4)
Chloride: 105 mmol/L (ref 98–110)
Creat: 1.87 mg/dL — ABNORMAL HIGH (ref 0.50–1.05)
GFR, EST NON AFRICAN AMERICAN: 29 mL/min/{1.73_m2} — AB (ref >=60)
GFR, Est African American: 34 mL/min/{1.73_m2} — ABNORMAL LOW (ref >=60)
Glucose, Bld: 180 mg/dL — ABNORMAL HIGH (ref 65–99)
Potassium: 3.9 mmol/L (ref 3.5–5.3)
Sodium: 143 mmol/L (ref 135–146)

## 2018-04-07 ENCOUNTER — Telehealth: Payer: Self-pay

## 2018-04-07 NOTE — Telephone Encounter (Signed)
-----   Message from Philemon Kingdom, MD sent at 04/06/2018  1:11 PM EDT ----- Lenna Sciara, can you please call pt: Kidney function is a little better but still low, so we still need to hold off Jardiance. TFTs normal.  For now, she can stay off methimazole but will need to check her thyroid tests again at next visit.

## 2018-04-08 NOTE — Telephone Encounter (Signed)
Tried to call pt. Unable to leave message.

## 2018-04-11 NOTE — Telephone Encounter (Signed)
Notified patient of message from Dr. Gherghe, patient expressed understanding and agreement. No further questions.  

## 2018-05-04 ENCOUNTER — Telehealth: Payer: Self-pay

## 2018-05-04 NOTE — Telephone Encounter (Signed)
TELEPHONE CALL NOTE  Emily [name] has been deemed a candidate for a follow-up tele-health visit to limit community exposure during the Covid-19 pandemic. I spoke with the patient via phone to ensure availability of phone/video source, confirm preferred email & phone number, discuss instructions and expectations, and review consent.   I reminded Emily [name] to be prepared with any vital sign and/or heart rhythm information that could potentially be obtained via home monitoring, at the time of her visit.  Finally, I reminded Emily [name] to expect an e-mail containing a link for their video-based visit approximately 15 minutes before her visit, or alternatively, a phone call at the time of her visit if her visit is planned to be a phone encounter.  Did the patient verbally consent to treatment as below? YES  ALLRED, AMANDA L, CMA 05/04/2018 11:15 AM  CONSENT FOR TELE-HEALTH VISIT - PLEASE REVIEW  I hereby voluntarily request, consent and authorize [facility] and its employed or contracted physicians, physician assistants, nurse practitioners or other licensed health care professionals (the Practitioner), to provide me with telemedicine health care services (the "Services") as deemed necessary by the treating Practitioner. I acknowledge and consent to receive the Services by the Practitioner via telemedicine. I understand that the telemedicine visit will involve communicating with the Practitioner through telephonic communication technology and the disclosure of certain medical information by electronic transmission. I acknowledge that I have been given the opportunity to request an in-person assessment or other available alternative prior to the telemedicine visit and am voluntarily participating in the telemedicine visit.  I understand that I have the right to withhold or withdraw my consent to the use of telemedicine in the course of my care at any time, without  affecting my right to future care or treatment, and that the Practitioner or I may terminate the telemedicine visit at any time. I understand that I have the right to inspect all information obtained and/or recorded in the course of the telemedicine visit and may receive copies of available information for a reasonable fee.  I understand that some of the potential risks of receiving the Services via telemedicine include:  Marland Kitchen Delay or interruption in medical evaluation due to technological equipment failure or disruption; . Information transmitted may not be sufficient (e.g. poor resolution of images) to allow for appropriate medical decision making by the Practitioner; and/or  . In rare instances, security protocols could fail, causing a breach of personal health information.  Furthermore, I acknowledge that it is my responsibility to provide information about my medical history, conditions and care that is complete and accurate to the best of my ability. I acknowledge that Practitioner's advice, recommendations, and/or decision may be based on factors not within their control, such as incomplete or inaccurate data provided by me or lack of visual representation. I understand that the practice of medicine is not an exact science and that Practitioner makes no warranties or guarantees regarding treatment outcomes. I acknowledge that I will receive a copy of this consent concurrently upon execution via email to the email address I last provided but may also request a printed copy by calling the office of [facility].    I understand that my insurance may be billed for this visit.   I have read or had this consent read to me. . I understand the contents of this consent, which adequately explains the benefits and risks of the Services being provided via telemedicine.  . I have been  provided ample opportunity to ask questions regarding this consent and the Services and have had my questions  answered to my satisfaction. . I give my informed consent for the services to be provided through the use of telemedicine in my medical care  By participating in this telemedicine visit I agree to the above.

## 2018-05-04 NOTE — Telephone Encounter (Signed)
   TELEPHONE CALL NOTE  This patient has been deemed a candidate for follow-up tele-health visit to limit community exposure during the Covid-19 pandemic. I spoke with the patient via phone to discuss instructions. The patient was advised to review the section on consent for treatment as well. The patient will receive a phone call 2-3 days prior to their E-Visit at which time consent will be verbally confirmed. A Virtual Office Visit appointment type has been scheduled for 05/06/2018 with Darylene Price FNP.  Vonda Antigua L, CMA 05/04/2018 11:14 AM

## 2018-05-04 NOTE — Telephone Encounter (Signed)
TELEPHONE CALL NOTE  Emily [name] has been deemed a candidate for a follow-up tele-health visit to limit community exposure during the Covid-19 pandemic. I spoke with the patient via phone to ensure availability of phone/video source, confirm preferred email & phone number, discuss instructions and expectations, and review consent.   I reminded Emily [name] to be prepared with any vital sign and/or heart rhythm information that could potentially be obtained via home monitoring, at the time of her visit.  Finally, I reminded Emily [name] to expect an e-mail containing a link for their video-based visit approximately 15 minutes before her visit, or alternatively, a phone call at the time of her visit if her visit is planned to be a phone encounter.  Did the patient verbally consent to treatment as below? YES  Emily [name] L, CMA 05/04/2018 11:15 AM  CONSENT FOR TELE-HEALTH VISIT - PLEASE REVIEW  I hereby voluntarily request, consent and authorize [facility] and its employed or contracted physicians, physician assistants, nurse practitioners or other licensed health care professionals (the Practitioner), to provide me with telemedicine health care services (the "Services") as deemed necessary by the treating Practitioner. I acknowledge and consent to receive the Services by the Practitioner via telemedicine. I understand that the telemedicine visit will involve communicating with the Practitioner through telephonic communication technology and the disclosure of certain medical information by electronic transmission. I acknowledge that I have been given the opportunity to request an in-person assessment or other available alternative prior to the telemedicine visit and am voluntarily participating in the telemedicine visit.  I understand that I have the right to withhold or withdraw my consent to the use of telemedicine in the course of my care at any time, without  affecting my right to future care or treatment, and that the Practitioner or I may terminate the telemedicine visit at any time. I understand that I have the right to inspect all information obtained and/or recorded in the course of the telemedicine visit and may receive copies of available information for a reasonable fee.  I understand that some of the potential risks of receiving the Services via telemedicine include:  Marland Kitchen Delay or interruption in medical evaluation due to technological equipment failure or disruption; . Information transmitted may not be sufficient (e.g. poor resolution of images) to allow for appropriate medical decision making by the Practitioner; and/or  . In rare instances, security protocols could fail, causing a breach of personal health information.  Furthermore, I acknowledge that it is my responsibility to provide information about my medical history, conditions and care that is complete and accurate to the best of my ability. I acknowledge that Practitioner's advice, recommendations, and/or decision may be based on factors not within their control, such as incomplete or inaccurate data provided by me or lack of visual representation. I understand that the practice of medicine is not an exact science and that Practitioner makes no warranties or guarantees regarding treatment outcomes. I acknowledge that I will receive a copy of this consent concurrently upon execution via email to the email address I last provided but may also request a printed copy by calling the office of [facility].    I understand that my insurance may be billed for this visit.   I have read or had this consent read to me. . I understand the contents of this consent, which adequately explains the benefits and risks of the Services being provided via telemedicine.  . I have been  provided ample opportunity to ask questions regarding this consent and the Services and have had my questions  answered to my satisfaction. . I give my informed consent for the services to be provided through the use of telemedicine in my medical care  By participating in this telemedicine visit I agree to the above.

## 2018-05-05 ENCOUNTER — Other Ambulatory Visit: Payer: Self-pay | Admitting: Internal Medicine

## 2018-05-05 DIAGNOSIS — E108 Type 1 diabetes mellitus with unspecified complications: Secondary | ICD-10-CM

## 2018-05-05 MED ORDER — INSULIN GLARGINE 100 UNIT/ML SOLOSTAR PEN: PEN_INJECTOR | SUBCUTANEOUS | 11 refills | Status: DC

## 2018-05-05 NOTE — Telephone Encounter (Signed)
Please advise on refilling the carvedilol (COREG

## 2018-05-05 NOTE — Telephone Encounter (Signed)
This is per her cardiologist.

## 2018-05-06 ENCOUNTER — Encounter: Payer: Self-pay | Admitting: Family

## 2018-05-06 ENCOUNTER — Other Ambulatory Visit: Payer: Self-pay

## 2018-05-06 ENCOUNTER — Ambulatory Visit: Payer: Medicaid Other | Attending: Family | Admitting: Family

## 2018-05-06 VITALS — Wt 202.4 lb

## 2018-05-06 DIAGNOSIS — E1122 Type 2 diabetes mellitus with diabetic chronic kidney disease: Secondary | ICD-10-CM

## 2018-05-06 DIAGNOSIS — I10 Essential (primary) hypertension: Secondary | ICD-10-CM

## 2018-05-06 DIAGNOSIS — I5032 Chronic diastolic (congestive) heart failure: Secondary | ICD-10-CM

## 2018-05-06 DIAGNOSIS — J449 Chronic obstructive pulmonary disease, unspecified: Secondary | ICD-10-CM

## 2018-05-06 DIAGNOSIS — Z794 Long term (current) use of insulin: Secondary | ICD-10-CM

## 2018-05-06 DIAGNOSIS — N184 Chronic kidney disease, stage 4 (severe): Secondary | ICD-10-CM

## 2018-05-06 NOTE — Progress Notes (Signed)
Virtual Visit via Telephone Note   Evaluation Performed:  Follow-up visit  This visit type was conducted due to national recommendations for restrictions regarding the COVID-19 Pandemic (e.g. social distancing).  This format is felt to be most appropriate for this patient at this time.  All issues noted in this document were discussed and addressed.  No physical exam was performed (except for noted visual exam findings with Video Visits).  Please refer to the patient's chart (MyChart message for video visits and phone note for telephone visits) for the patient's consent to telehealth for [facility]  Date:  05/06/2018   ID:  [name], DOB 11-Jan-1960, MRN [id number]  Patient Location:  [place] [address]   Provider location:   [facility] [place] [address], [place] [address]  PCP:  Gennette Pac, [place]  Cardiologist:  No primary care provider on file.  Electrophysiologist:  None   Chief Complaint:  fatigue  History of Present Illness:    Emily [name] is a 58 y.o. female who presents via audio/video conferencing for a telehealth visit today.  Patient verified DOB and address.  The patient does not have symptoms concerning for COVID-19 infection (fever, chills, cough, or new SHORTNESS OF BREATH).   Patient reports mild fatigue upon moderate exertion. She says that this has been present for several years. She has associated light-headedness at times. She denies any pedal edema, abdominal distention, palpitations, chest pain, shortness of breath, cough, rhinorrhea, difficulty sleeping or weight gain.   Prior CV studies:   The following studies were reviewed today:  Echo report from 02/13/2018 reviewed and showed an EF of 60-65%  Past Medical History:  Diagnosis Date  . Asthma   . CHF (congestive heart failure) ([place])   . COPD (chronic obstructive pulmonary disease) (H[facility])    on 2L home o2  . Diabetes mellitus  without complication ([place])   . Hyperlipidemia   . Hypertension   . Stroke [facility])    Was told at recent hospital visit she has stroke in past  . Thyroid disease   . Thyroid nodule 04/30/2016   Past Surgical History:  Procedure Laterality Date  . arm surgery Left      Current Meds  Medication Sig  . albuterol (PROVENTIL HFA;VENTOLIN HFA) 108 (90 Base) MCG/ACT inhaler Inhale 1-2 puffs into the lungs every 6 (six) hours as needed for wheezing or shortness of breath.  Marland Kitchen albuterol (PROVENTIL) (2.5 MG/3ML) 0.083% nebulizer solution Take 3 mLs (2.5 mg total) by nebulization every 6 (six) hours as needed for wheezing or shortness of breath.  Marland Kitchen amLODipine (NORVASC) 10 MG tablet Take 10 mg by mouth daily.  Marland Kitchen aspirin EC 81 MG tablet Take 81 mg by mouth daily.  Marland Kitchen atorvastatin (LIPITOR) 40 MG tablet TAKE ONE TABLET BY MOUTH EVERY DAY (Patient taking differently: Take 40 mg by mouth at bedtime. )  . budesonide-formoterol (SYMBICORT) 80-4.5 MCG/ACT inhaler Inhale 2 puffs into the lungs 2 (two) times daily.  . carvedilol (COREG) 25 MG tablet TAKE 1 TABLET BY MOUTH TWICE DAILY (Patient taking differently: Take 25 mg by mouth 2 (two) times daily. )  . cyclobenzaprine (FLEXERIL) 5 MG tablet TAKE ONE TABLET BY MOUTH EVERY DAY AS NEEDED FOR MUSCLE SPASMS (Patient taking differently: Take 5 mg by mouth daily as needed for muscle spasms. )  . Dulaglutide (TRULICITY) 1.5 FX/9.0WI SOPN Inject 1.5 mg into the skin once a week. (Patient taking differently: Inject 1.5  mg into the skin every Sunday. )  . furosemide (LASIX) 40 MG tablet Take 1 tablet (40 mg total) by mouth 2 (two) times daily.  . insulin aspart (NOVOLOG) 100 UNIT/ML FlexPen Inject 6 units fifteen minutes before breakfast and dinner.  . Insulin Glargine (LANTUS SOLOSTAR) 100 UNIT/ML Solostar Pen Inject under skin 50 units in am and 30 units at night  . lisinopril (PRINIVIL,ZESTRIL) 5 MG tablet Take 5 mg by mouth daily. for high blood pressure  .  tiotropium (SPIRIVA) 18 MCG inhalation capsule Place 18 mcg into inhaler and inhale daily.     Allergies:   Patient has no known allergies.   Social History   Tobacco Use  . Smoking status: Former Smoker    Packs/day: 1.50    Types: Cigarettes    Last attempt to quit: 01/13/2016    Years since quitting: 2.3  . Smokeless tobacco: Never Used  Substance Use Topics  . Alcohol use: No  . Drug use: No     Family Hx: The patient's family history includes CAD in her father; Diabetes in her father, mother, and sister; Hypertension in her father and mother.  ROS:   Please see the history of present illness.     All other systems reviewed and are negative.   Labs/Other Tests and Data Reviewed:    Recent Labs: 11/12/2017: ALT 17 02/12/2018: B Natriuretic Peptide 663.0 02/17/2018: Hemoglobin 16.5; Platelets 198 04/05/2018: BUN 33; Creat 1.87; Potassium 3.9; Sodium 143; TSH 0.45   Recent Lipid Panel Lab Results  Component Value Date/Time   CHOL 193 05/24/2017 10:12 AM   CHOL 125 05/14/2016 06:52 PM   TRIG 127.0 05/24/2017 10:12 AM   HDL 59.50 05/24/2017 10:12 AM   HDL 42 05/14/2016 06:52 PM   CHOLHDL 3 05/24/2017 10:12 AM   LDLCALC 108 (H) 05/24/2017 10:12 AM   LDLCALC 7 05/14/2016 06:52 PM    Wt Readings from Last 3 Encounters:  05/06/18 202 lb 6 oz (91.8 kg)  04/05/18 206 lb (93.4 kg)  02/21/18 216 lb (98 kg)     Exam:    Vital Signs:  Wt 202 lb 6 oz (91.8 kg) Comment: self-reported  BMI 33.94 kg/m    Well nourished, well developed female in no  acute distress.   ASSESSMENT & PLAN:    1.Chronic heart failure with preserved ejection fraction- - NYHA class II - euvolemic today based on patient's description of symptoms - weighing daily and says that her weight has ranged from 202-208 pounds; reminded to call for an overnight weight gain of >2 pounds or a weekly weight gain of >5 pounds - not adding salt and has been using Mrs. Dash for seasoning. She is trying to  closely follow a low sodium diet.  - BNP 02/12/2018 was 663.0  2: HTN- - not checking her BP at home - sees PCP at [place] from 04/05/2018 reviewed and showed sodium 143, potassium 3.9, creatinine 1.87 and GFR 34  3: COPD- - saw pulmonology Lanney Gins) 03/01/2018 - wearing oxygen at 4L around the clock  4: DM with CKD- - glucose this morning at home was 142 - A1c 02/12/2018 was 6.7% - saw endocrinology Cruzita Lederer) 04/05/2018    COVID-19 Education: The signs and symptoms of COVID-19 were discussed with the patient and how to seek care for testing (follow up with PCP or arrange E-visit).  The importance of social distancing was discussed today.  Patient Risk:   After full review of this  patients clinical status, I feel that they are at least moderate risk at this time.  Time:   Today, I have spent 11 minutes with the patient with telehealth technology discussing medications, daily weight and symptoms to report.     Medication Adjustments/Labs and Tests Ordered: Current medicines are reviewed at length with the patient today.  Concerns regarding medicines are outlined above.   Tests Ordered: No orders of the defined types were placed in this encounter.  Medication Changes: No orders of the defined types were placed in this encounter.   Disposition: Follow-up in 2 months or sooner for any questions/problems before then.   Signed, Alisa Graff, FNP  05/06/2018 11:09 AM    [facility]

## 2018-05-07 ENCOUNTER — Other Ambulatory Visit: Payer: Self-pay | Admitting: Internal Medicine

## 2018-05-07 DIAGNOSIS — E108 Type 1 diabetes mellitus with unspecified complications: Secondary | ICD-10-CM

## 2018-05-11 ENCOUNTER — Other Ambulatory Visit: Payer: Self-pay | Admitting: Family

## 2018-05-11 ENCOUNTER — Other Ambulatory Visit: Payer: Self-pay | Admitting: Internal Medicine

## 2018-05-11 MED ORDER — CARVEDILOL 25 MG PO TABS: 25.0000 mg | ORAL_TABLET | Freq: Two times a day (BID) | ORAL | 3 refills | Status: DC

## 2018-06-16 ENCOUNTER — Other Ambulatory Visit: Payer: Self-pay | Admitting: Internal Medicine

## 2018-06-16 DIAGNOSIS — E108 Type 1 diabetes mellitus with unspecified complications: Secondary | ICD-10-CM

## 2018-07-04 ENCOUNTER — Telehealth: Payer: Self-pay

## 2018-07-04 NOTE — Telephone Encounter (Signed)
TELEPHONE CALL NOTE  Emily [name] has been deemed a candidate for a follow-up tele-health visit to limit community exposure during the Covid-19 pandemic. I spoke with the patient via phone to ensure availability of phone/video source, confirm preferred email & phone number, discuss instructions and expectations, and review consent.   I reminded Emily [name] to be prepared with any vital sign and/or heart rhythm information that could potentially be obtained via home monitoring, at the time of her visit.  Finally, I reminded Emily [name] to expect an e-mail containing a link for their video-based visit approximately 15 minutes before her visit, or alternatively, a phone call at the time of her visit if her visit is planned to be a phone encounter.  Did the patient verbally consent to treatment as below? YES  Gaylord Shih, CMA 07/04/2018 11:51 AM  CONSENT FOR TELE-HEALTH VISIT - PLEASE REVIEW  I hereby voluntarily request, consent and authorize [facility] and its employed or contracted physicians, physician assistants, nurse practitioners or other licensed health care professionals (the Practitioner), to provide me with telemedicine health care services (the "Services") as deemed necessary by the treating Practitioner. I acknowledge and consent to receive the Services by the Practitioner via telemedicine. I understand that the telemedicine visit will involve communicating with the Practitioner through telephonic communication technology and the disclosure of certain medical information by electronic transmission. I acknowledge that I have been given the opportunity to request an in-person assessment or other available alternative prior to the telemedicine visit and am voluntarily participating in the telemedicine visit.  I understand that I have the right to withhold or withdraw my consent to the use of telemedicine in the course of my care at any time, without  affecting my right to future care or treatment, and that the Practitioner or I may terminate the telemedicine visit at any time. I understand that I have the right to inspect all information obtained and/or recorded in the course of the telemedicine visit and may receive copies of available information for a reasonable fee.  I understand that some of the potential risks of receiving the Services via telemedicine include:  Marland Kitchen Delay or interruption in medical evaluation due to technological equipment failure or disruption; . Information transmitted may not be sufficient (e.g. poor resolution of images) to allow for appropriate medical decision making by the Practitioner; and/or  . In rare instances, security protocols could fail, causing a breach of personal health information.  Furthermore, I acknowledge that it is my responsibility to provide information about my medical history, conditions and care that is complete and accurate to the best of my ability. I acknowledge that Practitioner's advice, recommendations, and/or decision may be based on factors not within their control, such as incomplete or inaccurate data provided by me or lack of visual representation. I understand that the practice of medicine is not an exact science and that Practitioner makes no warranties or guarantees regarding treatment outcomes. I acknowledge that I will receive a copy of this consent concurrently upon execution via email to the email address I last provided but may also request a printed copy by calling the office of [facility].    I understand that my insurance may be billed for this visit.   I have read or had this consent read to me. . I understand the contents of this consent, which adequately explains the benefits and risks of the Services being provided via telemedicine.  . I have been  provided ample opportunity to ask questions regarding this consent and the Services and have had my questions  answered to my satisfaction. . I give my informed consent for the services to be provided through the use of telemedicine in my medical care  By participating in this telemedicine visit I agree to the above.

## 2018-07-06 ENCOUNTER — Encounter: Payer: Self-pay | Admitting: Family

## 2018-07-06 ENCOUNTER — Ambulatory Visit: Payer: Medicaid Other | Attending: Family | Admitting: Family

## 2018-07-06 ENCOUNTER — Other Ambulatory Visit: Payer: Self-pay

## 2018-07-06 VITALS — Wt 204.0 lb

## 2018-07-06 DIAGNOSIS — I10 Essential (primary) hypertension: Secondary | ICD-10-CM

## 2018-07-06 DIAGNOSIS — I5032 Chronic diastolic (congestive) heart failure: Secondary | ICD-10-CM

## 2018-07-06 DIAGNOSIS — J449 Chronic obstructive pulmonary disease, unspecified: Secondary | ICD-10-CM

## 2018-07-06 DIAGNOSIS — Z794 Long term (current) use of insulin: Secondary | ICD-10-CM

## 2018-07-06 DIAGNOSIS — E1122 Type 2 diabetes mellitus with diabetic chronic kidney disease: Secondary | ICD-10-CM

## 2018-07-06 NOTE — Patient Instructions (Signed)
Continue weighing daily and call for an overnight weight gain of > 2 pounds or a weekly weight gain of >5 pounds. 

## 2018-07-06 NOTE — Progress Notes (Signed)
Virtual Visit via Telephone Note    Evaluation Performed:  Follow-up visit  This visit type was conducted due to national recommendations for restrictions regarding the COVID-19 Pandemic (e.g. social distancing).  This format is felt to be most appropriate for this patient at this time.  All issues noted in this document were discussed and addressed.  No physical exam was performed (except for noted visual exam findings with Video Visits).  Please refer to the patient's chart (MyChart message for video visits and phone note for telephone visits) for the patient's consent to telehealth for [facility]  Date:  07/06/2018   ID:  Emily [name], DOB 04/21/1960, MRN [id number]  Patient Location:  [place] [address]   Provider location:   [facility] [place] [address], [place] [address]  PCP:  Gennette Pac, [place]  Cardiologist:  none Electrophysiologist:  None   Chief Complaint:  fatigue  History of Present Illness:    Emily [name] is a 58 y.o. female who presents via audio/video conferencing for a telehealth visit today.  Patient verified DOB and address.  The patient does not have symptoms concerning for COVID-19 infection (fever, chills, cough, or new SHORTNESS OF BREATH).   Patient reports minimal fatigue upon moderate exertion. She describes this as chronic in nature having been present for several years. She has no other symptoms and specifically denies any dizziness, swelling in her legs/ abdomen, palpitations, chest pain, shortness of breath, cough, difficulty sleeping or weight gain.   Prior CV studies:   The following studies were reviewed today:  Echo report from 02/13/2018 reviewed and showed an EF of 60-65%  Past Medical History:  Diagnosis Date  . Asthma   . CHF (congestive heart failure) ([place])   . COPD (chronic obstructive pulmonary disease) (HCC)    on 2L home o2  . Diabetes mellitus without  complication ([place])   . Hyperlipidemia   . Hypertension   . Stroke [facility])    Was told at recent hospital visit she has stroke in past  . Thyroid disease   . Thyroid nodule 04/30/2016   Past Surgical History:  Procedure Laterality Date  . arm surgery Left      Current Meds  Medication Sig  . albuterol (PROVENTIL HFA;VENTOLIN HFA) 108 (90 Base) MCG/ACT inhaler Inhale 1-2 puffs into the lungs every 6 (six) hours as needed for wheezing or shortness of breath.  Marland Kitchen albuterol (PROVENTIL) (2.5 MG/3ML) 0.083% nebulizer solution Take 3 mLs (2.5 mg total) by nebulization every 6 (six) hours as needed for wheezing or shortness of breath.  Marland Kitchen amLODipine (NORVASC) 10 MG tablet Take 10 mg by mouth daily.  Marland Kitchen aspirin EC 81 MG tablet Take 81 mg by mouth daily.  Marland Kitchen atorvastatin (LIPITOR) 40 MG tablet TAKE ONE TABLET BY MOUTH EVERY DAY (Patient taking differently: Take 40 mg by mouth at bedtime. )  . budesonide-formoterol (SYMBICORT) 80-4.5 MCG/ACT inhaler Inhale 2 puffs into the lungs 2 (two) times daily.  . carvedilol (COREG) 25 MG tablet Take 1 tablet (25 mg total) by mouth 2 (two) times daily.  . cyclobenzaprine (FLEXERIL) 5 MG tablet TAKE ONE TABLET BY MOUTH EVERY DAY AS NEEDED FOR MUSCLE SPASMS (Patient taking differently: Take 5 mg by mouth daily as needed for muscle spasms. )  . Dulaglutide (TRULICITY) 1.5 WG/9.5AO SOPN Inject 1.5 mg into the skin once a week. (Patient taking differently: Inject 1.5 mg into the skin every Sunday. )  .  esomeprazole (NEXIUM) 40 MG capsule Take 40 mg by mouth daily.  . furosemide (LASIX) 40 MG tablet Take 1 tablet (40 mg total) by mouth 2 (two) times daily.  . insulin aspart (NOVOLOG) 100 UNIT/ML FlexPen Inject 6 units fifteen minutes before breakfast and dinner. (Patient taking differently: Inject 6 units fifteen minutes before breakfast and 18 units dinner.)  . Insulin Glargine (LANTUS SOLOSTAR) 100 UNIT/ML Solostar Pen INJECT 50 UNITS SUBCUTANEOUSLY TWICE DAILY (Patient  taking differently: 50 units AM/ 30 units PM)  . lisinopril (PRINIVIL,ZESTRIL) 5 MG tablet Take 5 mg by mouth daily. for high blood pressure  . tiotropium (SPIRIVA) 18 MCG inhalation capsule Place 18 mcg into inhaler and inhale daily.     Allergies:   Patient has no known allergies.   Social History   Tobacco Use  . Smoking status: Former Smoker    Packs/day: 1.50    Types: Cigarettes    Quit date: 01/13/2016    Years since quitting: 2.4  . Smokeless tobacco: Never Used  Substance Use Topics  . Alcohol use: No  . Drug use: No     Family Hx: The patient's family history includes CAD in her father; Diabetes in her father, mother, and sister; Hypertension in her father and mother.  ROS:   Please see the history of present illness.     All other systems reviewed and are negative.   Labs/Other Tests and Data Reviewed:    Recent Labs: 11/12/2017: ALT 17 02/12/2018: B Natriuretic Peptide 663.0 02/17/2018: Hemoglobin 16.5; Platelets 198 04/05/2018: BUN 33; Creat 1.87; Potassium 3.9; Sodium 143; TSH 0.45   Recent Lipid Panel Lab Results  Component Value Date/Time   CHOL 193 05/24/2017 10:12 AM   CHOL 125 05/14/2016 06:52 PM   TRIG 127.0 05/24/2017 10:12 AM   HDL 59.50 05/24/2017 10:12 AM   HDL 42 05/14/2016 06:52 PM   CHOLHDL 3 05/24/2017 10:12 AM   LDLCALC 108 (H) 05/24/2017 10:12 AM   LDLCALC 7 05/14/2016 06:52 PM    Wt Readings from Last 3 Encounters:  07/06/18 204 lb (92.5 kg)  05/06/18 202 lb 6 oz (91.8 kg)  04/05/18 206 lb (93.4 kg)     Exam:    Vital Signs:  Wt 204 lb (92.5 kg) Comment: self-reported  BMI 34.21 kg/m    Well nourished, well developed female in no  acute distress.   ASSESSMENT & PLAN:    1. Chronic heart failure with preserved ejection fraction- - NYHA classII - euvolemic today based on patient's description of symptoms - weighing daily and says that her weight has fluctuated some; reminded to call for an overnight weight gain of >2 pounds  or a weekly weight gain of >5 pounds - not adding salt and has been using Mrs. Dash for seasoning. She is trying to closely follow a low sodium diet.  - BNP 02/12/2018 was 663.0  2: HTN- - not checking her BP at home - sees PCP at [place] from 04/05/2018 reviewed and showed sodium 143, potassium 3.9, creatinine 1.87 and GFR 34  3: COPD- - saw pulmonology Lanney Gins) 03/01/2018 - wearing oxygen at 4L at bedtime and PRN during the day  4: DM with CKD- - glucose this morning at home was 158 - A1c 02/12/2018 was 6.7% - saw endocrinology Cruzita Lederer) 04/05/2018      COVID-19 Education: The signs and symptoms of COVID-19 were discussed with the patient and how to seek care for testing (follow up with PCP or  arrange E-visit).  The importance of social distancing was discussed today.  Patient Risk:   After full review of this patients clinical status, I feel that they are at least moderate risk at this time.  Time:   Today, I have spent 12 minutes with the patient with telehealth technology discussing medications, diet and symptoms to report.     Medication Adjustments/Labs and Tests Ordered: Current medicines are reviewed at length with the patient today.  Concerns regarding medicines are outlined above.   Tests Ordered: No orders of the defined types were placed in this encounter.  Medication Changes: No orders of the defined types were placed in this encounter.   Disposition:  Follow-up in 6 months or sooner for any questions/problems before then.   Signed, Alisa Graff, FNP  07/06/2018 11:08 AM    [facility]

## 2018-07-26 ENCOUNTER — Other Ambulatory Visit: Payer: Self-pay | Admitting: Internal Medicine

## 2018-07-26 DIAGNOSIS — E108 Type 1 diabetes mellitus with unspecified complications: Secondary | ICD-10-CM

## 2018-07-27 ENCOUNTER — Other Ambulatory Visit: Payer: Self-pay

## 2018-07-27 ENCOUNTER — Telehealth: Payer: Self-pay

## 2018-07-27 NOTE — Telephone Encounter (Signed)
She has stage 4 CKD, so Metformin is CI. She is now on: - Trulicity 1.5 mg weekly - Jardiance 10 mg before breakfast - Lantus 50 units in a.m. and 30 units at bedtime - NovoLog 6 units before breakfast and dinner Since Trulicity not covered, need to change to the covered one - Bydureon 2 mg weekly, OTW, DM is uncontrolled.

## 2018-07-27 NOTE — Telephone Encounter (Signed)
Trulicity is not covered by Medicaid.  Preferred medications are:  Bydureon Byetta Victoza   These are only covered if: Trial and failure of metformin containing products unless contraindicated or documented adverse even when using wither preferred or a non-preferred GLP 1 Receptor Agonist and combination.

## 2018-07-29 ENCOUNTER — Telehealth: Payer: Self-pay

## 2018-07-29 ENCOUNTER — Ambulatory Visit (INDEPENDENT_AMBULATORY_CARE_PROVIDER_SITE_OTHER): Payer: Medicaid Other | Admitting: Internal Medicine

## 2018-07-29 ENCOUNTER — Encounter: Payer: Self-pay | Admitting: Internal Medicine

## 2018-07-29 ENCOUNTER — Other Ambulatory Visit: Payer: Self-pay

## 2018-07-29 VITALS — BP 118/80 | HR 85 | Ht 64.75 in | Wt 206.0 lb

## 2018-07-29 DIAGNOSIS — E0590 Thyrotoxicosis, unspecified without thyrotoxic crisis or storm: Secondary | ICD-10-CM

## 2018-07-29 DIAGNOSIS — E040 Nontoxic diffuse goiter: Secondary | ICD-10-CM

## 2018-07-29 DIAGNOSIS — E041 Nontoxic single thyroid nodule: Secondary | ICD-10-CM | POA: Diagnosis not present

## 2018-07-29 DIAGNOSIS — E1159 Type 2 diabetes mellitus with other circulatory complications: Secondary | ICD-10-CM | POA: Diagnosis not present

## 2018-07-29 DIAGNOSIS — E049 Nontoxic goiter, unspecified: Secondary | ICD-10-CM | POA: Diagnosis not present

## 2018-07-29 DIAGNOSIS — E1165 Type 2 diabetes mellitus with hyperglycemia: Secondary | ICD-10-CM

## 2018-07-29 LAB — LIPID PANEL
Cholesterol: 143 mg/dL (ref 0–200)
HDL: 42.2 mg/dL (ref >39.00)
LDL Cholesterol: 70 mg/dL (ref 0–99)
NonHDL: 100.4
Total CHOL/HDL Ratio: 3
Triglycerides: 150 mg/dL — ABNORMAL HIGH (ref 0.0–149.0)
VLDL: 30 mg/dL (ref 0.0–40.0)

## 2018-07-29 LAB — POCT GLYCOSYLATED HEMOGLOBIN (HGB A1C): Hemoglobin A1C: 7.6 % — AB (ref 4.0–5.6)

## 2018-07-29 LAB — T3, FREE: T3, Free: 2.9 pg/mL (ref 2.3–4.2)

## 2018-07-29 LAB — TSH: TSH: 0.37 u[IU]/mL (ref 0.35–4.50)

## 2018-07-29 LAB — T4, FREE: Free T4: 0.81 ng/dL (ref 0.60–1.60)

## 2018-07-29 NOTE — Telephone Encounter (Signed)
-----   Message from Philemon Kingdom, MD sent at 07/29/2018  5:05 PM EDT ----- Lenna Sciara, can you please call pt: Thyroid tests are still normal.  We can continue off methimazole for now.  We will recheck the tests at next visit.  Her cholesterol level is very good.

## 2018-07-29 NOTE — Telephone Encounter (Signed)
Patient was seen today.

## 2018-07-29 NOTE — Patient Instructions (Addendum)
Please continue: - Trulicity 1.5 mg weekly/Bydureon 2 mg weekly - Lantus 50 units in a.m. and 30 units at bedtime  Please increase: - NovoLog 10 units before breakfast and 16 units before dinner  STOP SWEET DRINKS!  Please stop at the lab.  Please return in 3-4 months with your sugar log.

## 2018-07-29 NOTE — Telephone Encounter (Signed)
Notified patient of message from Dr. Gherghe, patient expressed understanding and agreement. No further questions.  

## 2018-07-29 NOTE — Progress Notes (Signed)
Patient ID: Emily [name], female   DOB: [birth year], 58 y.o.   MRN: [id number]   HPI  Emily [name] is a 58 y.o.-year-old female, returning for f/u for thyrotoxicosis/goiter and DM2, uncontrolled, insulin-dependent, with complications (CKD stage 3, cerebro-vascular ds. - s/p stroke). Last visit 4 months ago.  Since last OV, sugars are much higher, and, upon questioning, she started to drink regular sodas.  TMNG/Graves ds.: Reviewed and addended history: Pt was dx'ed with hyperthyroidism in ~2014 >> started MMI, lately 10 mg 2x daily. She was lost for f/u at [facility] as she could not travel over there.  Thyroid uptake and scan (06/22/2013) showed: FINDINGS: The 24 hour uptake was 24.7%. The normal reference range at 24 hours is 15 to 35%.   The thyroid scan demonstrates symmetric, mildly heterogenous distribution of radiopharmaceutical throughout the thyroid, which is increased in size.  IMPRESSION: Mildly heterogenous iodine uptake, cannot exclude toxic multinodular goiter versus possible mild Graves disease. Consider ultrasound for further correlation.  Patient was on methimazole before, however, this was stopped after  TSH returned very elevated, in the 90s (02/2015) >> TSH decreased to 8, then normalized >> we continued her off MMI.   However,she was started on MMI 10 mg 2x a day (re-Rx by Dr. Rosanna Randy: 04/17/2016, [facility]) - I do not think he was aware that we had stopped MMI.  We stopped methimazole again in 05/2016.  However, we had to restart again methimazole in 05/2017.  She was on 5 mg daily, but this was stopped during the hospitalization for COPD and CHF exacerbation 02/2018.  At last visit labs were normal so we did not restart methimazole.  Reviewed her TFTs: Lab Results  Component Value Date   TSH 0.45 04/05/2018   TSH 0.757 11/14/2017   TSH 1.61 09/27/2017   TSH 0.03 (L) 05/24/2017   TSH 0.54 08/20/2016   TSH 2.840 01/14/2016   TSH 3.20 12/10/2015   TSH 7.49 (H) 11/07/2015   TSH 2.35 07/31/2015   TSH 2.57 06/13/2015   FREET4 0.79 04/05/2018   FREET4 0.71 09/27/2017   FREET4 0.92 05/24/2017   FREET4 1.10 08/20/2016   FREET4 0.47 (L) 12/10/2015   FREET4 0.32 (L) 11/07/2015   FREET4 0.62 07/31/2015   FREET4 0.57 (L) 06/13/2015   FREET4 0.30 (L) 04/09/2015   Previously:   Thyroid U/S (05/12/2016): Large isthmic nodule Thyroid tissue is diffusely heterogeneous and enlarged.  The isthmus has a large isoechoic nodular structure. Unclear if this represents a discrete nodule versus focal heterogeneous thyroid tissue. This area measures up to 3.3 cm and recommend biopsy of this area.  Numerous small nodules and cysts throughout the left and right thyroid lobes. These predominantly cystic nodules do not meet criteria for biopsy or dedicated follow-up.  FNA of the isthmic nodule (06/25/2016): Benign  Pt denies: - feeling nodules in neck - hoarseness - dysphagia - choking - SOB with lying down  Pt does have a FH of thyroid ds.: aunt.No FH of thyroid cancer. No h/o radiation tx to head or neck.  No herbal supplements. No Biotin use. No recent steroids use.   DM2: History of noncompliance with medicines + Complications: History of stroke, also CKD, DR  Reviewed HbA1c levels: Lab Results  Component Value Date   HGBA1C 6.7 (H) 02/12/2018   HGBA1C 7.1 (A) 12/30/2017   HGBA1C 8.6 (A) 09/27/2017   She was on: - Lantus 45 units 2x a day >> was off insulin - "the pharmacy did not receive  it" - Januvia 50 mg in am >> stopped (?) - Glipizide 10 mg 2x a day >> started in the hospital We added Jardiance at last visit >> not taking  - "the pharmacy did not receive it" She was on Metformin >> diarrhea.  Now on: - Trulicity 1.5 mg weekly -but will need to switch to Bydureon per insurance preference (we try to get this approved for her) -  stopped 02/2018 due to decreased kidney function - Lantus 50 units in a.m. and 30 units at  bedtime - NovoLog 6 units before breakfast and 12 units before dinner  She is checking sugars twice a day - am: 70-136, 147, 167, HI >> 73-143, 172, HI >> 112-234, 269 - 2h after b'fast: n/c - lunch: n/c - 2h after lunch: n/c - dinner: n/c >> 80-305, 390 >> n/c >> 141 >> n/c - 2h after dinner: n/c >> 95-192 >> 185-269 - bedtime: 81-188, 209, 310, HI >> 80, 112-211, 239, HI >> 159-281 - nighttime: 167-295  + CKD; BUN/Cr worse lately: Lab Results  Component Value Date   BUN 33 (H) 04/05/2018   Lab Results  Component Value Date   CREATININE 1.87 (H) 04/05/2018  GFR 24 (02/18/2018)  + HL.  Latest lipid profile: Lab Results  Component Value Date   CHOL 193 05/24/2017   HDL 59.50 05/24/2017   LDLCALC 108 (H) 05/24/2017   TRIG 127.0 05/24/2017   CHOLHDL 3 05/24/2017  On Lipitor 40.  Latest dilated eye exam was in 01/2018: + DR  ROS: Constitutional: no weight gain/no weight loss, no fatigue, no subjective hyperthermia, no subjective hypothermia Eyes: no blurry vision, no xerophthalmia ENT: no sore throat, + see HPI Cardiovascular: no CP/no SOB/no palpitations/+ leg swelling Respiratory: no cough/no SOB/no wheezing Gastrointestinal: no N/no V/no D/no C/no acid reflux Musculoskeletal: no muscle aches/no joint aches Skin: no rashes, no hair loss Neurological: no tremors/no numbness/no tingling/no dizziness  I reviewed pt's medications, allergies, PMH, social hx, family hx, and changes were documented in the history of present illness. Otherwise, unchanged from my initial visit note.   Past Medical History:  Diagnosis Date  . Asthma   . CHF (congestive heart failure) ([place])   . COPD (chronic obstructive pulmonary disease) ([facility])    on 2L home o2  . Diabetes mellitus without complication ([place])   . Hyperlipidemia   . Hypertension   . Stroke [facility])    Was told at recent hospital visit she has stroke in past  . Thyroid disease   . Thyroid nodule 04/30/2016   Past Surgical  History:  Procedure Laterality Date  . arm surgery Left    Social History   Social History  . Marital Status: Single    Spouse Name: N/A  . Number of Children: 1   Occupational History  . None   Social History Main Topics  . Smoking status: Current Every Day Smoker -- 1.00 packs/day  . Smokeless tobacco: Not on file  . Alcohol Use: No  . Drug Use: No   Current Outpatient Medications on File Prior to Visit  Medication Sig Dispense Refill  . albuterol (PROVENTIL HFA;VENTOLIN HFA) 108 (90 Base) MCG/ACT inhaler Inhale 1-2 puffs into the lungs every 6 (six) hours as needed for wheezing or shortness of breath. 1 Inhaler 11  . albuterol (PROVENTIL) (2.5 MG/3ML) 0.083% nebulizer solution Take 3 mLs (2.5 mg total) by nebulization every 6 (six) hours as needed for wheezing or shortness of breath. 100 vial 0  .  amLODipine (NORVASC) 10 MG tablet Take 10 mg by mouth daily.    Marland Kitchen aspirin EC 81 MG tablet Take 81 mg by mouth daily.    Marland Kitchen atorvastatin (LIPITOR) 40 MG tablet TAKE ONE TABLET BY MOUTH EVERY DAY (Patient taking differently: Take 40 mg by mouth at bedtime. ) 90 tablet PRN  . budesonide-formoterol (SYMBICORT) 80-4.5 MCG/ACT inhaler Inhale 2 puffs into the lungs 2 (two) times daily. 1 Inhaler 12  . carvedilol (COREG) 25 MG tablet Take 1 tablet (25 mg total) by mouth 2 (two) times daily. 180 tablet 3  . cyclobenzaprine (FLEXERIL) 5 MG tablet TAKE ONE TABLET BY MOUTH EVERY DAY AS NEEDED FOR MUSCLE SPASMS (Patient taking differently: Take 5 mg by mouth daily as needed for muscle spasms. ) 30 tablet 0  . Dulaglutide (TRULICITY) 1.5 WJ/1.9JY SOPN Inject 1.5 mg into the skin once a week. (Patient taking differently: Inject 1.5 mg into the skin every Sunday. ) 12 pen 3  . esomeprazole (NEXIUM) 40 MG capsule Take 40 mg by mouth daily.    . furosemide (LASIX) 40 MG tablet Take 1 tablet (40 mg total) by mouth 2 (two) times daily. 60 tablet 11  . insulin aspart (NOVOLOG) 100 UNIT/ML FlexPen Inject 6  units fifteen minutes before breakfast and dinner. (Patient taking differently: Inject 6 units fifteen minutes before breakfast and 18 units dinner.) 15 mL 11  . LANTUS SOLOSTAR 100 UNIT/ML Solostar Pen INJECT 50 UNITS SUBCUTANEOUSLY TWICE DAILY 30 mL 0  . lisinopril (PRINIVIL,ZESTRIL) 5 MG tablet Take 5 mg by mouth daily. for high blood pressure    . tiotropium (SPIRIVA) 18 MCG inhalation capsule Place 18 mcg into inhaler and inhale daily.     No current facility-administered medications on file prior to visit.    No Known Allergies Family History  Problem Relation Age of Onset  . Diabetes Mother   . Hypertension Mother   . Diabetes Father   . Hypertension Father   . CAD Father   . Diabetes Sister    PE: BP 118/80   Pulse 85   Ht 5' 4.75" (1.645 m)   Wt 206 lb (93.4 kg)   SpO2 95%   BMI 34.55 kg/m  Body mass index is 34.55 kg/m.  Wt Readings from Last 3 Encounters:  07/29/18 206 lb (93.4 kg)  07/06/18 204 lb (92.5 kg)  05/06/18 202 lb 6 oz (91.8 kg)   Constitutional: overweight, in NAD Eyes: PERRLA, EOMI, no exophthalmos ENT: moist mucous membranes, + L>R thyromegaly, no cervical lymphadenopathy Cardiovascular: RRR, No MRG Respiratory: CTA B Gastrointestinal: abdomen soft, NT, ND, BS+ Musculoskeletal: no deformities, strength intact in all 4 Skin: moist, warm, no rashes Neurological: no tremor with outstretched hands, DTR normal in all 4  ASSESSMENT: 1. Thyrotoxicosis  2. Large goiter US SOFT TISSUE HEAD AND NECK  Order: [id number]  Status:  Final result Visible to patient:  No (Not Released) Dx:  Goiter diffuse  Details   Reading Physician Reading Date Result Priority  Markus Daft, MD 05/12/2016   Narrative    CLINICAL DATA: Thyromegaly on previous chest CT.  EXAM: THYROID ULTRASOUND  TECHNIQUE: Ultrasound examination of the thyroid gland and adjacent soft tissues was performed.  COMPARISON: Chest CT 01/14/2016  FINDINGS: Parenchymal Echotexture:  Moderately heterogenous  Isthmus: 1.2 cm in the AP dimension  Right lobe: 8.4 x 2.8 x 4.5 cm  Left lobe: 8.2 x 3.0 x 3.7 cm  _________________________________________________________  Estimated total number of nodules >/= 1 cm: 6-10  Number of spongiform nodules >/= 2 cm not described below (TR1): 0  Number of mixed cystic and solid nodules >/= 1.5 cm not described below ([place]): 0  _________________________________________________________  Nodule # 1:  Location: Isthmus; Inferior  Maximum size: 3.3 cm; Other 2 dimensions: 1.7 x 2.7 cm  Composition: solid/almost completely solid (2)  Echogenicity: isoechoic (1)  **Given size (>/= 2.5 cm) and appearance, fine needle aspiration of this mildly suspicious nodule should be considered based on TI-RADS criteria.  _________________________________________________________  Mildly complex cystic structure in the superior right thyroid lobe measuring up to 1.0 cm. Minimally complex cyst in the superior right thyroid lobe measuring up to 1.6 cm. Adjacent complex cysts or spongiform nodules in the mid right thyroid lobe, largest measuring 1.1 cm. Spongiform nodule in the mid left thyroid lobe measuring up to 1.3 cm.  Nodule # 2:  Location: Left; Mid  Maximum size: 2.0 cm; Other 2 dimensions: 0.8 x 0.8 cm  Composition: mixed cystic and solid (1)  Echogenicity: cannot determine (1)  This nodule does NOT meet TI-RADS criteria for biopsy or dedicated follow-up.  _________________________________________________________  IMPRESSION: Thyroid tissue is diffusely heterogeneous and enlarged.  The isthmus has a large isoechoic nodular structure. Unclear if this represents a discrete nodule versus focal heterogeneous thyroid tissue. This area measures up to 3.3 cm and recommend biopsy of this area.  Numerous small nodules and cysts throughout the left and right thyroid lobes. These predominantly cystic nodules do not meet  criteria for biopsy or dedicated follow-up.  The above is in keeping with the ACR TI-RADS recommendations - J Am Coll Radiol 2017;14:587-595.   Electronically Signed By: Markus Daft M.D. On: 05/12/2016 12:03        Addendum: Adequacy Reason Satisfactory For Evaluation. Diagnosis THYROID, FINE NEEDLE ASPIRATION ISTHMUS INFERIOR (SPECIMEN 1 OF 1, COLLECTED ON 06/25/2016): CONSISTENT WITH BENIGN FOLLICULAR NODULE (BETHESDA CATEGORY II). Casimer Lanius MD Pathologist, Electronic Signature (Case signed 06/26/2016) Specimen Clinical Information Nodule 1 Isthmus Inferior 3.3 cm; Other 2 dimensions: 1.7 x 2.7 cm, Solid / almost completely solid, Isoechoic, ACR TI-RADS total points: 3, Midly suspicious nodule Source Thyroid, Fine Needle Aspiration, Isthmus Inferior, (Specimen 1 of 1, colected on 06/25/16 )  3. DM2, insulin dependent, uncontrolled  PLAN:  1. And 2.  -She has a history of thyrotoxicosis and, per thyroid uptake and scan, this is either due to mild Graves' disease or mild multinodular goiter -She appears euthyroid -She was previously on methimazole 5 mg daily but this was stopped at last hospitalization, unclear why -At last visit, we checked her TFTs and they were normal so we did not restart methimazole. -We will recheck TFTs today  2. Large goiter -She has minimal neck compression symptoms, not very bothersome -Reviewed the report of the latest thyroid ultrasound and biopsy results.  The dominant nodule is easily, measuring 3.3 cm and is benign -We will continue to monitor her clinically  3. DM2 - Patient with longstanding, uncontrolled, type 2 diabetes, on weekly GLP-1 receptor agonist and basal/bolus insulin regimen.  Unfortunately, at last visit, we had to stop her SGLT 2 inhibitor due to decreased kidney function (GFR 24).  We also moved NovoLog before meals, as she was taking this after meals.  HbA1c was 6.7%, improved. -At this visit, sugars are much, in the 1  questioning, she is drinking regular sodas.  I strongly advised him to stop -Trulicity is not covered anymore and we are trying to get Bydureon for her.  In the meantime,  I gave her a sample of Ozempic pen and I advised her to use 0.5 mg weekly. -States sugars are much higher, will increase her NovoLog for now. - I advised her to: Patient Instructions  Please continue: - Trulicity 1.5 mg weekly/Bydureon 2 mg weekly - Lantus 50 units in a.m. and 30 units at bedtime  Please increase: - NovoLog 10 units before breakfast and 16 units before dinner  STOP SWEET DRINKS!  Please stop at the lab.  Please return in 3-4 months with your sugar log.   - we checked her HbA1c: 7.6% (higher) - advised to check sugars at different times of the day - 3x a day, rotating check times - advised for yearly eye exams >> she is UTD - return to clinic in 3-4 months  CC: PCP: Gennette Pac, FNP - [phone]  Component     Latest Ref Rng & Units 07/29/2018  Cholesterol     0 - 200 mg/dL 143  Triglycerides     0.0 - 149.0 mg/dL 150.0 (H)  HDL Cholesterol     >39.00 mg/dL 42.20  VLDL     0.0 - 40.0 mg/dL 30.0  LDL (calc)     0 - 99 mg/dL 70  Total CHOL/HDL Ratio      3  NonHDL      100.40  Hemoglobin A1C     4.0 - 5.6 % 7.6 (A)  TSH     0.35 - 4.50 uIU/mL 0.37  T4,Free(Direct)     0.60 - 1.60 ng/dL 0.81  Triiodothyronine,Free,Serum     2.3 - 4.2 pg/mL 2.9   Thyroid tests are still normal.  We will recheck them at next visit. Lipid panel at goal.  Philemon Kingdom, MD PhD [facility] Endocrinology

## 2018-08-05 ENCOUNTER — Other Ambulatory Visit: Payer: Self-pay | Admitting: Internal Medicine

## 2018-08-05 MED ORDER — BYDUREON 2 MG ~~LOC~~ PEN: 2.0000 mg | PEN_INJECTOR | SUBCUTANEOUS | 11 refills | Status: DC

## 2018-08-05 NOTE — Progress Notes (Signed)
Bydureon sent due to insurance requirement.   Abby Nena Jordan, MD  [facility] Endocrinology  [facility] [address] [place], [place] [address] Phone: [phone] FAX: [phone]

## 2018-08-29 ENCOUNTER — Telehealth: Payer: Self-pay | Admitting: Internal Medicine

## 2018-08-29 MED ORDER — OZEMPIC (0.25 OR 0.5 MG/DOSE) 2 MG/1.5ML ~~LOC~~ SOPN: 0.5000 mg | PEN_INJECTOR | SUBCUTANEOUS | 3 refills | Status: DC

## 2018-08-29 NOTE — Telephone Encounter (Signed)
RX sent

## 2018-08-29 NOTE — Telephone Encounter (Signed)
Please advise, last chart note states:  Trulicity is not covered anymore and we are trying to get Bydureon for her.  In the meantime, I gave her a sample of Ozempic pen and I advised her to use 0.5 mg weekly. -States sugars are much higher, will increase her NovoLog for now.

## 2018-08-29 NOTE — Telephone Encounter (Signed)
MEDICATION: Ozempic  PHARMACY:  [facility] on  IS THIS A 90 DAY SUPPLY :   IS PATIENT OUT OF MEDICATION:  IF NOT; HOW MUCH IS LEFT:   LAST APPOINTMENT DATE: @7 /24/2020  NEXT APPOINTMENT DATE:@11 /23/2020  DO WE HAVE YOUR PERMISSION TO LEAVE A DETAILED MESSAGE: yes  OTHER COMMENTS:    **Let patient know to contact pharmacy at the end of the day to make sure medication is ready. **  ** Please notify patient to allow 48-72 hours to process**  **Encourage patient to contact the pharmacy for refills or they can request refills through [facility]**

## 2018-08-29 NOTE — Telephone Encounter (Signed)
OK to send Ozempic 0.5 mg weekly #6 pen with 3 refills

## 2018-08-30 ENCOUNTER — Telehealth: Payer: Self-pay | Admitting: Internal Medicine

## 2018-08-30 ENCOUNTER — Other Ambulatory Visit: Payer: Self-pay | Admitting: Internal Medicine

## 2018-08-30 DIAGNOSIS — E108 Type 1 diabetes mellitus with unspecified complications: Secondary | ICD-10-CM

## 2018-08-30 NOTE — Telephone Encounter (Signed)
This has been sent

## 2018-08-30 NOTE — Telephone Encounter (Signed)
MEDICATION: Lantus  PHARMACY:  [facility]  [place]   IS THIS A 90 DAY SUPPLY : unknown  IS PATIENT OUT OF MEDICATION: YES  IF NOT; HOW MUCH IS LEFT:   LAST APPOINTMENT DATE: @8 /25/2020  NEXT APPOINTMENT DATE:@11 /23/2020  DO WE HAVE YOUR PERMISSION TO LEAVE A DETAILED MESSAGE:  OTHER COMMENTS:    **Let patient know to contact pharmacy at the end of the day to make sure medication is ready. **  ** Please notify patient to allow 48-72 hours to process**  **Encourage patient to contact the pharmacy for refills or they can request refills through [facility]**

## 2018-09-08 ENCOUNTER — Telehealth: Payer: Self-pay | Admitting: Internal Medicine

## 2018-09-08 NOTE — Telephone Encounter (Signed)
We can try Bydureon 2 g weekly.  I think this is preferred with Medicaid.

## 2018-09-08 NOTE — Telephone Encounter (Signed)
Pts insurance is not covering ozempic will need a PA  [facility] did not give her the name of an alternate

## 2018-09-08 NOTE — Telephone Encounter (Signed)
There are not any alternatives with Medicaid. A PA has to be done.  Dr. Cruzita Lederer please advise

## 2018-09-09 ENCOUNTER — Telehealth: Payer: Self-pay

## 2018-09-09 NOTE — Telephone Encounter (Signed)
PA has not been done yet. Will work on it next week.

## 2018-09-09 NOTE — Addendum Note (Signed)
Addended by: Cardell Peach I on: 09/09/2018 08:49 AM   Modules accepted: Orders

## 2018-09-09 NOTE — Telephone Encounter (Signed)
Patient calling back to see if Ozempic has been approved-I informed patient per note that a PA has been started-patient would like a call back with the results as soon as possible

## 2018-09-09 NOTE — Telephone Encounter (Signed)
OK 

## 2018-09-09 NOTE — Telephone Encounter (Signed)
Chart notes show we Rx'd Bydureon in July. Should we proceed with a PA request for ozempic.

## 2018-09-13 NOTE — Telephone Encounter (Signed)
PA has been faxed.

## 2018-10-03 ENCOUNTER — Other Ambulatory Visit: Payer: Self-pay | Admitting: Internal Medicine

## 2018-10-03 DIAGNOSIS — E108 Type 1 diabetes mellitus with unspecified complications: Secondary | ICD-10-CM

## 2018-11-14 ENCOUNTER — Other Ambulatory Visit: Payer: Self-pay | Admitting: Internal Medicine

## 2018-11-14 DIAGNOSIS — E108 Type 1 diabetes mellitus with unspecified complications: Secondary | ICD-10-CM

## 2018-11-28 ENCOUNTER — Ambulatory Visit: Payer: Medicaid Other | Admitting: Internal Medicine

## 2018-12-26 ENCOUNTER — Ambulatory Visit (INDEPENDENT_AMBULATORY_CARE_PROVIDER_SITE_OTHER): Payer: Medicaid Other | Admitting: Internal Medicine

## 2018-12-26 ENCOUNTER — Encounter: Payer: Self-pay | Admitting: Internal Medicine

## 2018-12-26 ENCOUNTER — Other Ambulatory Visit: Payer: Self-pay

## 2018-12-26 VITALS — BP 130/80 | HR 87 | Ht 64.75 in | Wt 223.0 lb

## 2018-12-26 DIAGNOSIS — E041 Nontoxic single thyroid nodule: Secondary | ICD-10-CM

## 2018-12-26 DIAGNOSIS — E1159 Type 2 diabetes mellitus with other circulatory complications: Secondary | ICD-10-CM

## 2018-12-26 DIAGNOSIS — E0590 Thyrotoxicosis, unspecified without thyrotoxic crisis or storm: Secondary | ICD-10-CM

## 2018-12-26 DIAGNOSIS — E049 Nontoxic goiter, unspecified: Secondary | ICD-10-CM | POA: Diagnosis not present

## 2018-12-26 DIAGNOSIS — E1165 Type 2 diabetes mellitus with hyperglycemia: Secondary | ICD-10-CM

## 2018-12-26 DIAGNOSIS — E040 Nontoxic diffuse goiter: Secondary | ICD-10-CM

## 2018-12-26 LAB — T4, FREE: Free T4: 0.9 ng/dL (ref 0.60–1.60)

## 2018-12-26 LAB — POCT GLYCOSYLATED HEMOGLOBIN (HGB A1C): Hemoglobin A1C: 7.2 % — AB (ref 4.0–5.6)

## 2018-12-26 LAB — TSH: TSH: 0.15 u[IU]/mL — ABNORMAL LOW (ref 0.35–4.50)

## 2018-12-26 LAB — T3, FREE: T3, Free: 3 pg/mL (ref 2.3–4.2)

## 2018-12-26 NOTE — Patient Instructions (Signed)
Please continue: - Ozempic 0.5 mg weeekly - Lantus 50 units in a.m. and 30 units at bedtime - NovoLog 10 units before breakfast and 16 (-20) units before dinner  STOP SWEET DRINKS!  Please return in 4 months with your sugar log.

## 2018-12-26 NOTE — Progress Notes (Signed)
Patient ID: Emily [name], female   DOB: 05/12/1960, 58 y.o.   MRN: [id number]  This visit occurred during the SARS-CoV-2 public health emergency.  Safety protocols were in place, including screening questions prior to the visit, additional usage of staff PPE, and extensive cleaning of exam room while observing appropriate contact time as indicated for disinfecting solutions.   HPI  Emily [name] is a 58 y.o.-year-old female, returning for f/u for thyrotoxicosis/goiter and DM2, uncontrolled, insulin-dependent, with complications (CKD stage 3, cerebro-vascular ds. - s/p stroke). Last visit 5 months ago.  At last visit sugars were much higher and upon questioning she started to drink regular sodas.  I strongly advised her to stop but at today's visit she tells me that she still drinks them.  TMNG/Graves ds.: Reviewed and addended history: Pt was dx'ed with hyperthyroidism in ~2014 >> started MMI, lately 10 mg 2x daily. She was lost for f/u at [facility] as she could not travel over there.  Thyroid uptake and scan (06/22/2013) showed: FINDINGS: The 24 hour uptake was 24.7%. The normal reference range at 24 hours is 15 to 35%.   The thyroid scan demonstrates symmetric, mildly heterogenous distribution of radiopharmaceutical throughout the thyroid, which is increased in size.  IMPRESSION: Mildly heterogenous iodine uptake, cannot exclude toxic multinodular goiter versus possible mild Graves disease. Consider ultrasound for further correlation.  Patient was on methimazole before, however, this was stopped after  TSH returned very elevated, in the 90s (02/2015) >> TSH decreased to 8, then normalized >> we continued her off MMI.   However,she was started on MMI 10 mg 2x a day (re-Rx by Dr. Rosanna Randy: 04/17/2016, [facility]) - I do not think he was aware that we had stopped MMI.  We stopped methimazole again in 05/2016.  However, we had to restart again methimazole in 05/2017.  She was on 5 mg  daily, but this was stopped during the hospitalization for COPD and CHF exacerbation 02/2018.  At last check, her labs are normal so we did not have to restart methimazole.  Reviewed her TFTs: Lab Results  Component Value Date   TSH 0.37 07/29/2018   TSH 0.45 04/05/2018   TSH 0.757 11/14/2017   TSH 1.61 09/27/2017   TSH 0.03 (L) 05/24/2017   TSH 0.54 08/20/2016   TSH 2.840 01/14/2016   TSH 3.20 12/10/2015   TSH 7.49 (H) 11/07/2015   TSH 2.35 07/31/2015   FREET4 0.81 07/29/2018   FREET4 0.79 04/05/2018   FREET4 0.71 09/27/2017   FREET4 0.92 05/24/2017   FREET4 1.10 08/20/2016   FREET4 0.47 (L) 12/10/2015   FREET4 0.32 (L) 11/07/2015   FREET4 0.62 07/31/2015   FREET4 0.57 (L) 06/13/2015   FREET4 0.30 (L) 04/09/2015   Previously:   Thyroid U/S (05/12/2016): Large isthmic nodule Thyroid tissue is diffusely heterogeneous and enlarged.  The isthmus has a large isoechoic nodular structure. Unclear if this represents a discrete nodule versus focal heterogeneous thyroid tissue. This area measures up to 3.3 cm and recommend biopsy of this area.  Numerous small nodules and cysts throughout the left and right thyroid lobes. These predominantly cystic nodules do not meet criteria for biopsy or dedicated follow-up.  FNA of the isthmic nodule (06/25/2016): Benign  Pt denies: - feeling nodules in neck - hoarseness - dysphagia - choking - SOB with lying down  Pt does have a FH of thyroid ds.: aunt. No FH of thyroid cancer. No h/o radiation tx to head or neck.  No seaweed or  kelp. No recent contrast studies. No herbal supplements. No Biotin use. No recent steroids use.   DM2: She has a history of noncompliance with medications She had several complications: History of stroke, also CKD, DR  Reviewed HbA1c levels: Lab Results  Component Value Date   HGBA1C 7.6 (A) 07/29/2018   HGBA1C 6.7 (H) 02/12/2018   HGBA1C 7.1 (A) 12/30/2017   She was on: - Lantus 45 units 2x a  day >> was off insulin - "the pharmacy did not receive it" - Januvia 50 mg in am >> stopped (?) - Glipizide 10 mg 2x a day >> started in the hospital We added Jardiance at last visit >> not taking  - "the pharmacy did not receive it" She was on Metformin >> diarrhea.  Now on: - Trulicity 1.5 mg weekly >> Ozempic 0.5 mg weekly - Lantus 50 units in a.m. and 30 units at bedtime - NovoLog 6 >> 10  units before breakfast and 12 >> 16 units before dinner We had to start Jardiance 10 mg 02/2018 due to decreased kidney function.  She is checking sugars twice a day: - am:  73-143, 172, HI >> 112-234, 269 >>> 91-152, 165, 183 - 2h after b'fast: n/c - lunch: n/c - 2h after lunch: n/c - dinner: n/c >> 80-305, 390 >> n/c >> 141 >> n/c - 2h after dinner: n/c >> 95-192 >> 185-269 >> 119, 122 - bedtime: 80, 112-211, 239, HI >> 159-281 >> 97-102 - nighttime: 167-295  >> 139-197, 211, 217  + CKD; latest BUN/Cr: Lab Results  Component Value Date   BUN 33 (H) 04/05/2018   Lab Results  Component Value Date   CREATININE 1.87 (H) 04/05/2018  GFR 24 (02/18/2018)  + HL.  Latest lipid profile: Lab Results  Component Value Date   CHOL 143 07/29/2018   HDL 42.20 07/29/2018   LDLCALC 70 07/29/2018   TRIG 150.0 (H) 07/29/2018   CHOLHDL 3 07/29/2018  On Lipitor 40.  Latest dilated eye exam was in 01/2018: + DR  ROS: Constitutional: no weight gain/no weight loss, no fatigue, no subjective hyperthermia, no subjective hypothermia Eyes: no blurry vision, no xerophthalmia ENT: no sore throat, + see HPI Cardiovascular: no CP/no SOB/no palpitations/+ leg swelling Respiratory: no cough/no SOB/no wheezing Gastrointestinal: no N/no V/no D/no C/no acid reflux Musculoskeletal: no muscle aches/no joint aches Skin: no rashes, no hair loss Neurological: no tremors/no numbness/no tingling/no dizziness  I reviewed pt's medications, allergies, PMH, social hx, family hx, and changes were documented in the  history of present illness. Otherwise, unchanged from my initial visit note.   Past Medical History:  Diagnosis Date  . Asthma   . CHF (congestive heart failure) ([place])   . COPD (chronic obstructive pulmonary disease) (HCC)    on 2L home o2  . Diabetes mellitus without complication ([place])   . Hyperlipidemia   . Hypertension   . Stroke [facility])    Was told at recent hospital visit she has stroke in past  . Thyroid disease   . Thyroid nodule 04/30/2016   Past Surgical History:  Procedure Laterality Date  . arm surgery Left    Social History   Social History  . Marital Status: Single    Spouse Name: N/A  . Number of Children: 1   Occupational History  . None   Social History Main Topics  . Smoking status: Current Every Day Smoker -- 1.00 packs/day  . Smokeless tobacco: Not on file  . Alcohol Use: No  .  Drug Use: No   Current Outpatient Medications on File Prior to Visit  Medication Sig Dispense Refill  . albuterol (PROVENTIL HFA;VENTOLIN HFA) 108 (90 Base) MCG/ACT inhaler Inhale 1-2 puffs into the lungs every 6 (six) hours as needed for wheezing or shortness of breath. 1 Inhaler 11  . albuterol (PROVENTIL) (2.5 MG/3ML) 0.083% nebulizer solution Take 3 mLs (2.5 mg total) by nebulization every 6 (six) hours as needed for wheezing or shortness of breath. 100 vial 0  . amLODipine (NORVASC) 10 MG tablet Take 10 mg by mouth daily.    Marland Kitchen aspirin EC 81 MG tablet Take 81 mg by mouth daily.    Marland Kitchen atorvastatin (LIPITOR) 40 MG tablet TAKE ONE TABLET BY MOUTH EVERY DAY (Patient taking differently: Take 40 mg by mouth at bedtime. ) 90 tablet PRN  . budesonide-formoterol (SYMBICORT) 80-4.5 MCG/ACT inhaler Inhale 2 puffs into the lungs 2 (two) times daily. 1 Inhaler 12  . carvedilol (COREG) 25 MG tablet Take 1 tablet (25 mg total) by mouth 2 (two) times daily. 180 tablet 3  . cyclobenzaprine (FLEXERIL) 5 MG tablet TAKE ONE TABLET BY MOUTH EVERY DAY AS NEEDED FOR MUSCLE SPASMS (Patient taking  differently: Take 5 mg by mouth daily as needed for muscle spasms. ) 30 tablet 0  . esomeprazole (NEXIUM) 40 MG capsule Take 40 mg by mouth daily.    . Exenatide ER (BYDUREON) 2 MG PEN Inject 2 mg into the skin once a week. 4 each 11  . furosemide (LASIX) 40 MG tablet Take 1 tablet (40 mg total) by mouth 2 (two) times daily. 60 tablet 11  . insulin aspart (NOVOLOG) 100 UNIT/ML FlexPen Inject 6 units fifteen minutes before breakfast and dinner. (Patient taking differently: Inject 6 units fifteen minutes before breakfast and 18 units dinner.) 15 mL 11  . LANTUS SOLOSTAR 100 UNIT/ML Solostar Pen INJECT 50 UNITS SUBCUTANEOUSLY TWICE DAILY 30 mL 2  . lisinopril (PRINIVIL,ZESTRIL) 5 MG tablet Take 5 mg by mouth daily. for high blood pressure    . tiotropium (SPIRIVA) 18 MCG inhalation capsule Place 18 mcg into inhaler and inhale daily.     No current facility-administered medications on file prior to visit.   No Known Allergies Family History  Problem Relation Age of Onset  . Diabetes Mother   . Hypertension Mother   . Diabetes Father   . Hypertension Father   . CAD Father   . Diabetes Sister    PE: BP 130/80   Pulse 87   Ht 5' 4.75" (1.645 m)   Wt 223 lb (101.2 kg)   SpO2 97%   BMI 37.40 kg/m  Body mass index is 37.4 kg/m.  Wt Readings from Last 3 Encounters:  12/26/18 223 lb (101.2 kg)  07/29/18 206 lb (93.4 kg)  07/06/18 204 lb (92.5 kg)   Constitutional: overweight, in NAD Eyes: PERRLA, EOMI, no exophthalmos ENT: moist mucous membranes, +L>R thyromegaly, no cervical lymphadenopathy Cardiovascular: RRR, No MRG, +L>R peripheral edema Respiratory: CTA B Gastrointestinal: abdomen soft, NT, ND, BS+ Musculoskeletal: no deformities, strength intact in all 4 Skin: moist, warm, no rashes Neurological: no tremor with outstretched hands, DTR normal in all 4  ASSESSMENT: 1. Thyrotoxicosis  2. Large goiter US SOFT TISSUE HEAD AND NECK  Order: [id number]  Status:  Final result  Visible to patient:  No (Not Released) Dx:  Goiter diffuse  Details   Reading Physician Reading Date Result Priority  Markus Daft, MD 05/12/2016   Narrative    CLINICAL DATA:  Thyromegaly on previous chest CT.  EXAM: THYROID ULTRASOUND  TECHNIQUE: Ultrasound examination of the thyroid gland and adjacent soft tissues was performed.  COMPARISON: Chest CT 01/14/2016  FINDINGS: Parenchymal Echotexture: Moderately heterogenous  Isthmus: 1.2 cm in the AP dimension  Right lobe: 8.4 x 2.8 x 4.5 cm  Left lobe: 8.2 x 3.0 x 3.7 cm  _________________________________________________________  Estimated total number of nodules >/= 1 cm: 6-10  Number of spongiform nodules >/= 2 cm not described below (TR1): 0  Number of mixed cystic and solid nodules >/= 1.5 cm not described below ([place]): 0  _________________________________________________________  Nodule # 1:  Location: Isthmus; Inferior  Maximum size: 3.3 cm; Other 2 dimensions: 1.7 x 2.7 cm  Composition: solid/almost completely solid (2)  Echogenicity: isoechoic (1)  **Given size (>/= 2.5 cm) and appearance, fine needle aspiration of this mildly suspicious nodule should be considered based on TI-RADS criteria.  _________________________________________________________  Mildly complex cystic structure in the superior right thyroid lobe measuring up to 1.0 cm. Minimally complex cyst in the superior right thyroid lobe measuring up to 1.6 cm. Adjacent complex cysts or spongiform nodules in the mid right thyroid lobe, largest measuring 1.1 cm. Spongiform nodule in the mid left thyroid lobe measuring up to 1.3 cm.  Nodule # 2:  Location: Left; Mid  Maximum size: 2.0 cm; Other 2 dimensions: 0.8 x 0.8 cm  Composition: mixed cystic and solid (1)  Echogenicity: cannot determine (1)  This nodule does NOT meet TI-RADS criteria for biopsy or  dedicated follow-up.  _________________________________________________________  IMPRESSION: Thyroid tissue is diffusely heterogeneous and enlarged.  The isthmus has a large isoechoic nodular structure. Unclear if this represents a discrete nodule versus focal heterogeneous thyroid tissue. This area measures up to 3.3 cm and recommend biopsy of this area.  Numerous small nodules and cysts throughout the left and right thyroid lobes. These predominantly cystic nodules do not meet criteria for biopsy or dedicated follow-up.  The above is in keeping with the ACR TI-RADS recommendations - J Am Coll Radiol 2017;14:587-595.   Electronically Signed By: Markus Daft M.D. On: 05/12/2016 12:03        Addendum: Adequacy Reason Satisfactory For Evaluation. Diagnosis THYROID, FINE NEEDLE ASPIRATION ISTHMUS INFERIOR (SPECIMEN 1 OF 1, COLLECTED ON 06/25/2016): CONSISTENT WITH BENIGN FOLLICULAR NODULE ([place] CATEGORY II). Casimer Lanius MD Pathologist, Electronic Signature (Case signed 06/26/2016) Specimen Clinical Information Nodule 1 Isthmus Inferior 3.3 cm; Other 2 dimensions: 1.7 x 2.7 cm, Solid / almost completely solid, Isoechoic, ACR TI-RADS total points: 3, Midly suspicious nodule Source Thyroid, Fine Needle Aspiration, Isthmus Inferior, (Specimen 1 of 1, colected on 06/25/16 )  3. DM2, insulin dependent, uncontrolled  PLAN:  1. And 2.  -She has a history of thyrotoxicosis and, per her thyroid uptake and scan, this is either due to mild Graves' disease or mild multinodular goiter -She appears euthyroid -At last visit, TFTs were normal -She was previously on methimazole 5 mg daily and this was stopped during her last hospitalization, unclear why.  We did not have to restart it at last visit -We will need to recheck her TFTs since TSH was close to the lower limit of normal in 07/2018  2. Large goiter -She has minimal neck compression symptoms, not very  bothersome -Reviewed the report of her latest thyroid ultrasound and biopsy.  The dominant nodule measures 3.3 cm and has a benign biopsy. -We will continue to monitor her clinically  3. DM2 - Patient with longstanding, uncontrolled, type 2 diabetes,  on basal-bolus insulin regimen and weekly GLP-1 receptor agonist.  Unfortunately, we had to stop her SGLT2 inhibitor in the past due to decreased kidney function. -Trulicity was not covered at last visit and Bydureon was not approved.  We sent a PA for Ozempic.  This was approved.  She is tolerating it well. -At last visit, sugars are much higher, and upon questioning, she was drinking sweet drinks which I strongly advised her to stop.  We also increased her NovoLog.  At this visit, she tells me that she is still drinking sodas and I again advised her that she absolutely need to stop.  Overall, however, her sugars are much improved, with only occasional hyperglycemic spikes.  Therefore, for now, I would not change her regimen but advised her to use a larger dose of NovoLog with dinner if she has a larger meal or dessert after meal - she gained 17 lbs since last OV -given that this is mostly related to fluid retention - I advised her to: Patient Instructions  Please continue: - Ozempic 0.5 mg weeekly - Lantus 50 units in a.m. and 30 units at bedtime - NovoLog 10 units before breakfast and 16 (-20) units before dinner  STOP SWEET DRINKS!  Please return in 4 months with your sugar log.   - we checked her HbA1c: 7.2% (better) - advised to check sugars at different times of the day - 3x a day, rotating check times - advised for yearly eye exams >> she is UTD - return to clinic in 4 months  CC: PCP: Gennette Pac, FNP - [phone]   Component     Latest Ref Rng & Units 12/26/2018  TSH     0.35 - 4.50 uIU/mL 0.15 (L)  T4,Free(Direct)     0.60 - 1.60 ng/dL 0.90  Triiodothyronine,Free,Serum     2.3 - 4.2 pg/mL 3.0   TSH is now again low.   We will start back on 5 mg of methimazole and recheck the tests in 1.5 months.  Philemon Kingdom, MD PhD [facility] Endocrinology

## 2018-12-27 MED ORDER — METHIMAZOLE 5 MG PO TABS: 5.0000 mg | ORAL_TABLET | Freq: Every day | ORAL | 3 refills | Status: DC

## 2018-12-28 ENCOUNTER — Telehealth: Payer: Self-pay

## 2018-12-28 NOTE — Telephone Encounter (Signed)
Notified patient of message from Dr. Cruzita Lederer, patient expressed understanding and agreement. No further questions.  Reminder set for lab recall.

## 2018-12-28 NOTE — Telephone Encounter (Signed)
-----   Message from Philemon Kingdom, MD sent at 12/27/2018  8:06 AM EST ----- Lenna Sciara, can you please call pt:  TSH is now again low.  We will need to start back on 5 mg of methimazole and recheck the tests in 1.5 months.  Prescription sent.  Labs are in.

## 2019-01-09 NOTE — Progress Notes (Deleted)
Patient ID: Emily [name], female    DOB: 03-Mar-1960, 59 y.o.   MRN: [id number]  HPI  Emily [name] is a 59 y/o female with a history of asthma, DM, hyperlipidemia, HTN, stroke, thyroid disease, COPD, sleep apnea, former tobacco use and chronic heart failure.   Echo report from 02/13/2018 reviewed and showed an EF of 60-65%.   Has not been admitted or been in the ED in the last 6 months.    She presents today for a follow-up visit with a chief complaint of   Past Medical History:  Diagnosis Date  . Asthma   . CHF (congestive heart failure) ([place])   . COPD (chronic obstructive pulmonary disease) (HCC)    on 2L home o2  . Diabetes mellitus without complication ([place])   . Hyperlipidemia   . Hypertension   . Stroke [facility])    Was told at recent hospital visit she has stroke in past  . Thyroid disease   . Thyroid nodule 04/30/2016   Past Surgical History:  Procedure Laterality Date  . arm surgery Left    Family History  Problem Relation Age of Onset  . Diabetes Mother   . Hypertension Mother   . Diabetes Father   . Hypertension Father   . CAD Father   . Diabetes Sister    Social History   Tobacco Use  . Smoking status: Former Smoker    Packs/day: 1.50    Types: Cigarettes    Quit date: 01/13/2016    Years since quitting: 2.9  . Smokeless tobacco: Never Used  Substance Use Topics  . Alcohol use: No   No Known Allergies   Review of Systems  Constitutional: Positive for fatigue (minimal). Negative for appetite change.  HENT: Negative for congestion, postnasal drip and sore throat.   Eyes: Positive for visual disturbance ("blurry vision").  Respiratory: Positive for shortness of breath. Negative for cough and wheezing.   Cardiovascular: Positive for leg swelling. Negative for chest pain and palpitations.  Gastrointestinal: Negative for abdominal distention and abdominal pain.  Endocrine: Negative.   Genitourinary: Negative.   Musculoskeletal: Negative for back pain and  neck pain.  Skin: Negative.   Allergic/Immunologic: Negative.   Neurological: Negative for dizziness and light-headedness.  Hematological: Negative for adenopathy. Does not bruise/bleed easily.  Psychiatric/Behavioral: Negative for dysphoric mood and sleep disturbance (sleeping on 2-3 pillows). The patient is not nervous/anxious.       Physical Exam Vitals and nursing note reviewed.  Constitutional:      Appearance: Normal appearance.  HENT:     Head: Normocephalic and atraumatic.  Cardiovascular:     Rate and Rhythm: Normal rate and regular rhythm.  Pulmonary:     Effort: Pulmonary effort is normal. No respiratory distress.     Breath sounds: No wheezing or rales.  Abdominal:     General: There is no distension.     Palpations: Abdomen is soft.  Musculoskeletal:        General: No tenderness.     Cervical back: Normal range of motion and neck supple.     Right lower leg: Edema (trace pitting) present.     Left lower leg: Edema (trace pitting) present.  Skin:    General: Skin is warm and dry.  Neurological:     General: No focal deficit present.     Mental Status: She is alert and oriented to person, place, and time.  Psychiatric:        Mood and Affect: Mood normal.  Behavior: Behavior normal.        Thought Content: Thought content normal.     Assessment & Plan:  1: Chronic heart failure with preserved ejection fraction- - NYHA class II - euvolemic today - weighed herself this morning at home and she was instructed to weigh daily and to call for an overnight weight gain of >2 pounds or a weekly weight gain of >5 pounds - weight 216 from last visit 11 months ago - not adding salt and has been using Mrs. Dash for seasoning.  - BNP 02/12/2018 was 663.0 -  - says that she's received her flu vaccine for this season  2: HTN- - BP  - sees PCP at [facility] from 02/14/2018 reviewed and showed sodium 135, potassium 4.4, creatinine 2.46 and  GFR 24  3: COPD- - saw pulmonology Lanney Gins) 12/14/17 - wearing oxygen at 4L around the clock  4: Obstructive sleep apnea- - ? Needs trilogy or CPAP - she will call the company when she gets home as she says that her "mask" isn't working right  5: DM with CKD- - nonfasting glucose in clinic today was  - A1c 02/12/2018 was 6.7% - saw endocrinology Cruzita Lederer) 12/30/17  Medication bottles were reviewed.

## 2019-01-10 ENCOUNTER — Ambulatory Visit: Payer: Medicaid Other | Admitting: Family

## 2019-01-10 ENCOUNTER — Telehealth: Payer: Self-pay | Admitting: Family

## 2019-01-10 NOTE — Telephone Encounter (Signed)
Patient did not show for her [facility] appointment on 01/10/19. Will attempt to reschedule.

## 2019-01-31 ENCOUNTER — Other Ambulatory Visit: Payer: Self-pay | Admitting: Family

## 2019-01-31 MED ORDER — FUROSEMIDE 40 MG PO TABS: 40.0000 mg | ORAL_TABLET | Freq: Two times a day (BID) | ORAL | 0 refills | Status: DC

## 2019-02-01 ENCOUNTER — Other Ambulatory Visit: Payer: Self-pay

## 2019-02-01 ENCOUNTER — Telehealth: Payer: Self-pay

## 2019-02-01 DIAGNOSIS — Z1211 Encounter for screening for malignant neoplasm of colon: Secondary | ICD-10-CM

## 2019-02-01 MED ORDER — NA SULFATE-K SULFATE-MG SULF 17.5-3.13-1.6 GM/177ML PO SOLN: 354.0000 mL | Freq: Once | ORAL | 0 refills | Status: AC

## 2019-02-01 NOTE — Telephone Encounter (Signed)
Gastroenterology Pre-Procedure Review    PATIENT REVIEW QUESTIONS: The patient responded to the following health history questions as indicated:    1. Are you having any GI issues? no 2. Do you have a personal history of Polyps? no 3. Do you have a family history of Colon Cancer or Polyps? no 4. Diabetes Mellitus? Yes  5. Joint replacements in the past 12 months?no 6. Major health problems in the past 3 months?no 7. Any artificial heart valves, MVP, or defibrillator?no    MEDICATIONS & ALLERGIES:    Patient reports the following regarding taking any anticoagulation/antiplatelet therapy:   Plavix, Coumadin, Eliquis, Xarelto, Lovenox, Pradaxa, Brilinta, or Effient? no Aspirin?Aspirin 81mg    Patient confirms/reports the following medications:  Current Outpatient Medications  Medication Sig Dispense Refill  . albuterol (PROVENTIL HFA;VENTOLIN HFA) 108 (90 Base) MCG/ACT inhaler Inhale 1-2 puffs into the lungs every 6 (six) hours as needed for wheezing or shortness of breath. 1 Inhaler 11  . albuterol (PROVENTIL) (2.5 MG/3ML) 0.083% nebulizer solution Take 3 mLs (2.5 mg total) by nebulization every 6 (six) hours as needed for wheezing or shortness of breath. 100 vial 0  . amLODipine (NORVASC) 10 MG tablet Take 10 mg by mouth daily.    Marland Kitchen aspirin EC 81 MG tablet Take 81 mg by mouth daily.    Marland Kitchen atorvastatin (LIPITOR) 40 MG tablet TAKE ONE TABLET BY MOUTH EVERY DAY (Patient taking differently: Take 40 mg by mouth at bedtime. ) 90 tablet PRN  . budesonide-formoterol (SYMBICORT) 80-4.5 MCG/ACT inhaler Inhale 2 puffs into the lungs 2 (two) times daily. 1 Inhaler 12  . carvedilol (COREG) 25 MG tablet Take 1 tablet (25 mg total) by mouth 2 (two) times daily. 180 tablet 3  . cyclobenzaprine (FLEXERIL) 5 MG tablet TAKE ONE TABLET BY MOUTH EVERY DAY AS NEEDED FOR MUSCLE SPASMS (Patient taking differently: Take 5 mg by mouth daily as needed for muscle spasms. ) 30 tablet 0  . esomeprazole (NEXIUM) 40 MG  capsule Take 40 mg by mouth daily.    . Exenatide ER (BYDUREON) 2 MG PEN Inject 2 mg into the skin once a week. 4 each 11  . furosemide (LASIX) 40 MG tablet Take 1 tablet (40 mg total) by mouth 2 (two) times daily. MUST MAKE APPOINTMENT FOR FURTHER REFILLS 60 tablet 0  . insulin aspart (NOVOLOG) 100 UNIT/ML FlexPen Inject 6 units fifteen minutes before breakfast and dinner. (Patient taking differently: Inject 6 units fifteen minutes before breakfast and 18 units dinner.) 15 mL 11  . LANTUS SOLOSTAR 100 UNIT/ML Solostar Pen INJECT 50 UNITS SUBCUTANEOUSLY TWICE DAILY 30 mL 2  . lisinopril (PRINIVIL,ZESTRIL) 5 MG tablet Take 5 mg by mouth daily. for high blood pressure    . methimazole (TAPAZOLE) 5 MG tablet Take 1 tablet (5 mg total) by mouth daily. 90 tablet 3  . tiotropium (SPIRIVA) 18 MCG inhalation capsule Place 18 mcg into inhaler and inhale daily.     No current facility-administered medications for this visit.    Patient confirms/reports the following allergies:  No Known Allergies  No orders of the defined types were placed in this encounter.   AUTHORIZATION INFORMATION Primary Insurance: 1D#: Group #:  Secondary Insurance: 1D#: Group #:  SCHEDULE INFORMATION: Date: 02/14/2019 Time: Location: [place]

## 2019-02-09 ENCOUNTER — Encounter: Payer: Self-pay | Admitting: Gastroenterology

## 2019-02-09 ENCOUNTER — Ambulatory Visit (INDEPENDENT_AMBULATORY_CARE_PROVIDER_SITE_OTHER): Payer: Medicaid Other | Admitting: Vascular Surgery

## 2019-02-09 ENCOUNTER — Encounter (INDEPENDENT_AMBULATORY_CARE_PROVIDER_SITE_OTHER): Payer: Self-pay | Admitting: Vascular Surgery

## 2019-02-09 ENCOUNTER — Other Ambulatory Visit: Payer: Self-pay

## 2019-02-09 VITALS — BP 115/76 | HR 92 | Resp 16 | Wt 222.0 lb

## 2019-02-09 DIAGNOSIS — I872 Venous insufficiency (chronic) (peripheral): Secondary | ICD-10-CM | POA: Diagnosis not present

## 2019-02-09 DIAGNOSIS — M79604 Pain in right leg: Secondary | ICD-10-CM

## 2019-02-09 DIAGNOSIS — J449 Chronic obstructive pulmonary disease, unspecified: Secondary | ICD-10-CM | POA: Diagnosis not present

## 2019-02-09 DIAGNOSIS — E782 Mixed hyperlipidemia: Secondary | ICD-10-CM

## 2019-02-09 DIAGNOSIS — M79605 Pain in left leg: Secondary | ICD-10-CM

## 2019-02-09 DIAGNOSIS — I10 Essential (primary) hypertension: Secondary | ICD-10-CM | POA: Diagnosis not present

## 2019-02-10 ENCOUNTER — Other Ambulatory Visit
Admission: RE | Admit: 2019-02-10 | Discharge: 2019-02-10 | Disposition: A | Discharge status: Home / Self Care | Payer: Medicaid Other | Source: Ambulatory Visit | Attending: Gastroenterology | Admitting: Gastroenterology

## 2019-02-10 DIAGNOSIS — Z20822 Contact with and (suspected) exposure to covid-19: Secondary | ICD-10-CM | POA: Diagnosis not present

## 2019-02-10 DIAGNOSIS — Z01812 Encounter for preprocedural laboratory examination: Secondary | ICD-10-CM | POA: Insufficient documentation

## 2019-02-10 LAB — SARS CORONAVIRUS 2 (TAT 6-24 HRS): SARS Coronavirus 2: NEGATIVE

## 2019-02-12 ENCOUNTER — Encounter (INDEPENDENT_AMBULATORY_CARE_PROVIDER_SITE_OTHER): Payer: Self-pay | Admitting: Vascular Surgery

## 2019-02-12 DIAGNOSIS — E785 Hyperlipidemia, unspecified: Secondary | ICD-10-CM | POA: Insufficient documentation

## 2019-02-12 DIAGNOSIS — I872 Venous insufficiency (chronic) (peripheral): Secondary | ICD-10-CM | POA: Insufficient documentation

## 2019-02-12 NOTE — Progress Notes (Signed)
MRN : [id number]  Emily [name] is a 59 y.o. (08-08-1960) female who presents with chief complaint of  Chief Complaint  Patient presents with  . New Patient (Initial Visit)    ref [facility] for PVD  .  History of Present Illness:   Patient is seen for evaluation of leg pain and leg swelling. The patient first noticed the swelling remotely. The swelling is associated with pain and discoloration. The pain and swelling worsens with prolonged dependency and improves with elevation. The pain is unrelated to activity.  The patient notes that in the morning the legs are significantly improved but they steadily worsened throughout the course of the day. The patient also notes a steady worsening of the discoloration in the ankle and shin area.   The patient denies claudication symptoms.  The patient denies symptoms consistent with rest pain.  The patient denies and extensive history of DJD and LS spine disease.  The patient has no had any past angiography, interventions or vascular surgery.  Elevation makes the leg symptoms better, dependency makes them much worse. There is no history of ulcerations. The patient denies any recent changes in medications.  The patient has not been wearing graduated compression.  The patient denies a history of DVT or PE. There is no prior history of phlebitis. There is no history of primary lymphedema.  No history of malignancies. No history of trauma or groin or pelvic surgery. There is no history of radiation treatment to the groin or pelvis  The patient denies amaurosis fugax or recent TIA symptoms. There are no recent neurological changes noted. The patient denies recent episodes of angina or shortness of breath  Current Meds  Medication Sig  . Acetylcysteine (NAC) 600 MG CAPS Take by mouth daily.  Marland Kitchen albuterol (PROVENTIL HFA;VENTOLIN HFA) 108 (90 Base) MCG/ACT inhaler Inhale 1-2 puffs into the lungs every 6 (six) hours as needed for wheezing or  shortness of breath.  Marland Kitchen albuterol (PROVENTIL) (2.5 MG/3ML) 0.083% nebulizer solution Take 3 mLs (2.5 mg total) by nebulization every 6 (six) hours as needed for wheezing or shortness of breath.  Marland Kitchen amLODipine (NORVASC) 10 MG tablet Take 10 mg by mouth daily. am  . aspirin EC 81 MG tablet Take 81 mg by mouth daily.  Marland Kitchen atorvastatin (LIPITOR) 40 MG tablet TAKE ONE TABLET BY MOUTH EVERY DAY (Patient taking differently: Take 40 mg by mouth at bedtime. )  . budesonide-formoterol (SYMBICORT) 80-4.5 MCG/ACT inhaler Inhale 2 puffs into the lungs 2 (two) times daily.  . carvedilol (COREG) 25 MG tablet Take 1 tablet (25 mg total) by mouth 2 (two) times daily. (Patient taking differently: Take 25 mg by mouth 2 (two) times daily. Am and pm)  . cyclobenzaprine (FLEXERIL) 5 MG tablet TAKE ONE TABLET BY MOUTH EVERY DAY AS NEEDED FOR MUSCLE SPASMS (Patient taking differently: Take 5 mg by mouth daily as needed for muscle spasms. )  . esomeprazole (NEXIUM) 40 MG capsule Take 40 mg by mouth daily. am  . Exenatide ER (BYDUREON) 2 MG PEN Inject 2 mg into the skin once a week.  . furosemide (LASIX) 40 MG tablet Take 1 tablet (40 mg total) by mouth 2 (two) times daily. MUST MAKE APPOINTMENT FOR FURTHER REFILLS  . insulin aspart (NOVOLOG) 100 UNIT/ML FlexPen Inject 6 units fifteen minutes before breakfast and dinner. (Patient taking differently: Inject 10 units fifteen minutes before breakfast and 18 units dinner.)  . ipratropium (ATROVENT) 0.06 % nasal spray Place 2 sprays into both  nostrils 4 (four) times daily.  Marland Kitchen LANTUS SOLOSTAR 100 UNIT/ML Solostar Pen INJECT 50 UNITS SUBCUTANEOUSLY TWICE DAILY (Patient taking differently: INJECT 50 UNITS in Am and 30 at night SUBCUTANEOUSLY TWICE DAILY)  . lisinopril (PRINIVIL,ZESTRIL) 5 MG tablet Take 5 mg by mouth daily. AM for high blood pressure  . methimazole (TAPAZOLE) 5 MG tablet Take 1 tablet (5 mg total) by mouth daily. (Patient taking differently: Take 5 mg by mouth daily. am)   . tiotropium (SPIRIVA) 18 MCG inhalation capsule Place 18 mcg into inhaler and inhale daily.    Past Medical History:  Diagnosis Date  . Arthritis    hands, all over  . Asthma   . CHF (congestive heart failure) ([place])   . COPD (chronic obstructive pulmonary disease) ([facility])    on 2L home o2  . Diabetes mellitus without complication ([place])    type 2  . Dyspnea   . Hyperlipidemia   . Hypertension   . Sleep apnea    CPAP  . Stroke [facility])    Was told at recent hospital visit she has stroke in past  . Thyroid disease   . Thyroid nodule 04/30/2016    Past Surgical History:  Procedure Laterality Date  . arm surgery Left     Social History Social History   Tobacco Use  . Smoking status: Former Smoker    Packs/day: 1.50    Years: 40.00    Pack years: 60.00    Types: Cigarettes    Quit date: 01/13/2016    Years since quitting: 3.0  . Smokeless tobacco: Never Used  Substance Use Topics  . Alcohol use: No  . Drug use: No    Family History Family History  Problem Relation Age of Onset  . Diabetes Mother   . Hypertension Mother   . Diabetes Father   . Hypertension Father   . CAD Father   . Diabetes Sister     No Known Allergies   REVIEW OF SYSTEMS (Negative unless checked)  Constitutional: [] Weight loss  [] Fever  [] Chills Cardiac: [] Chest pain   [] Chest pressure   [] Palpitations   [] Shortness of breath when laying flat   [] Shortness of breath with exertion. Vascular:  [] Pain in legs with walking   [x] Pain in legs at rest  [] History of DVT   [] Phlebitis   [x] Swelling in legs   [] Varicose veins   [] Non-healing ulcers Pulmonary:   [] Uses home oxygen   [] Productive cough   [] Hemoptysis   [] Wheeze  [] COPD   [] Asthma Neurologic:  [] Dizziness   [] Seizures   [] History of stroke   [] History of TIA  [] Aphasia   [] Vissual changes   [] Weakness or numbness in arm   [] Weakness or numbness in leg Musculoskeletal:   [] Joint swelling   [] Joint pain   [] Low back pain Hematologic:   [] Easy bruising  [] Easy bleeding   [] Hypercoagulable state   [] Anemic Gastrointestinal:  [] Diarrhea   [] Vomiting  [x] Gastroesophageal reflux/heartburn   [] Difficulty swallowing. Genitourinary:  [] Chronic kidney disease   [] Difficult urination  [] Frequent urination   [] Blood in urine Skin:  [] Rashes   [] Ulcers  Psychological:  [] History of anxiety   []  History of major depression.  Physical Examination  Vitals:   02/09/19 1345  BP: 115/76  Pulse: 92  Resp: 16  Weight: 222 lb (100.7 kg)   Body mass index is 37.23 kg/m. Gen: WD/WN, NAD Head: [place]/AT, No temporalis wasting.  Ear/Nose/Throat: Hearing grossly intact, nares w/o erythema or drainage Eyes: PER, EOMI, sclera  nonicteric.  Neck: Supple, no large masses.   Pulmonary:  Good air movement, no audible wheezing bilaterally, no use of accessory muscles.  Cardiac: RRR, no JVD Vascular: scattered varicosities present bilaterally.  severe venous stasis changes to the legs bilaterally.  4+ soft pitting edema Vessel Right Left  Radial Palpable Palpable  PT Trace Palpable Trace Palpable  DP Not Palpable Not Palpable  Gastrointestinal: Non-distended. No guarding/no peritoneal signs.  Musculoskeletal: M/S 5/5 throughout.  No deformity or atrophy.  Neurologic: CN 2-12 intact. Symmetrical.  Speech is fluent. Motor exam as listed above. Psychiatric: Judgment intact, Mood & affect appropriate for pt's clinical situation. Dermatologic: Severe venous  rashes no ulcers noted.  No changes consistent with cellulitis. Lymph : No lichenification or skin changes of chronic lymphedema.  CBC Lab Results  Component Value Date   WBC 7.7 02/17/2018   HGB 16.5 (H) 02/17/2018   HCT 53.6 (H) 02/17/2018   MCV 91.8 02/17/2018   PLT 198 02/17/2018    BMET    Component Value Date/Time   NA 143 04/05/2018 1008   NA 142 05/14/2016 1852   NA 140 06/02/2013 1230   K 3.9 04/05/2018 1008   K 3.5 06/02/2013 1230   CL 105 04/05/2018 1008   CL 107  06/02/2013 1230   CO2 28 04/05/2018 1008   CO2 26 06/02/2013 1230   GLUCOSE 180 (H) 04/05/2018 1008   GLUCOSE 178 (H) 06/02/2013 1230   BUN 33 (H) 04/05/2018 1008   BUN 27 (H) 05/14/2016 1852   BUN 14 06/02/2013 1230   CREATININE 1.87 (H) 04/05/2018 1008   CALCIUM 9.1 04/05/2018 1008   CALCIUM 9.5 06/02/2013 1230   GFRNONAA 29 (L) 04/05/2018 1008   GFRAA 34 (L) 04/05/2018 1008   CrCl cannot be calculated (Patient's most recent lab result is older than the maximum 21 days allowed.).  COAG Lab Results  Component Value Date   INR 0.94 08/13/2016    Radiology No results found.    Assessment/Plan 1. Leg pain, bilateral No surgery or intervention at this point in time.    I have had a long discussion with the patient regarding venous insufficiency and why it  causes symptoms. I have discussed with the patient the chronic skin changes that accompany venous insufficiency and the long term sequela such as infection and ulceration.  Patient will begin wearing graduated compression stockings class 1 (20-30 mmHg) or compression wraps on a daily basis a prescription was given. The patient will put the stockings on first thing in the morning and removing them in the evening. The patient is instructed specifically not to sleep in the stockings.    In addition, behavioral modification including several periods of elevation of the lower extremities during the day will be continued. I have demonstrated that proper elevation is a position with the ankles at heart level.  The patient is instructed to begin routine exercise, especially walking on a daily basis  Patient should undergo duplex ultrasound of the venous system to ensure that DVT or reflux is not present.  Following the review of the ultrasound the patient will follow up in 2-3 months to reassess the degree of swelling and the control that graduated compression stockings or compression wraps  is offering.   The patient can be assessed  for a Lymph Pump at that time - VAS [place] LOWER EXTREMITY VENOUS REFLUX; Future  2. Chronic venous insufficiency No surgery or intervention at this point in time.    I have  had a long discussion with the patient regarding venous insufficiency and why it  causes symptoms. I have discussed with the patient the chronic skin changes that accompany venous insufficiency and the long term sequela such as infection and ulceration.  Patient will begin wearing graduated compression stockings class 1 (20-30 mmHg) or compression wraps on a daily basis a prescription was given. The patient will put the stockings on first thing in the morning and removing them in the evening. The patient is instructed specifically not to sleep in the stockings.    In addition, behavioral modification including several periods of elevation of the lower extremities during the day will be continued. I have demonstrated that proper elevation is a position with the ankles at heart level.  The patient is instructed to begin routine exercise, especially walking on a daily basis  Patient should undergo duplex ultrasound of the venous system to ensure that DVT or reflux is not present.  Following the review of the ultrasound the patient will follow up in 2-3 months to reassess the degree of swelling and the control that graduated compression stockings or compression wraps  is offering.   The patient can be assessed for a Lymph Pump at that time - VAS [place] LOWER EXTREMITY VENOUS REFLUX; Future  3. Essential hypertension Continue antihypertensive medications as already ordered, these medications have been reviewed and there are no changes at this time.   4. Chronic obstructive pulmonary disease, unspecified COPD type ([place]) Continue pulmonary medications and aerosols as already ordered, these medications have been reviewed and there are no changes at this time.    5. Mixed hyperlipidemia Continue statin as ordered and reviewed, no  changes at this time     Hortencia Pilar, MD  02/12/2019 4:29 PM

## 2019-02-13 ENCOUNTER — Other Ambulatory Visit: Payer: Self-pay

## 2019-02-13 NOTE — Discharge Instructions (Signed)
General Anesthesia, Adult, Care After This sheet gives you information about how to care for yourself after your procedure. Your health care provider may also give you more specific instructions. If you have problems or questions, contact your health care provider. What can I expect after the procedure? After the procedure, the following side effects are common:  Pain or discomfort at the IV site.  Nausea.  Vomiting.  Sore throat.  Trouble concentrating.  Feeling cold or chills.  Weak or tired.  Sleepiness and fatigue.  Soreness and body aches. These side effects can affect parts of the body that were not involved in surgery. Follow these instructions at home:  For at least 24 hours after the procedure:  Have a responsible adult stay with you. It is important to have someone help care for you until you are awake and alert.  Rest as needed.  Do not: ? Participate in activities in which you could fall or become injured. ? Drive. ? Use heavy machinery. ? Drink alcohol. ? Take sleeping pills or medicines that cause drowsiness. ? Make important decisions or sign legal documents. ? Take care of children on your own. Eating and drinking  Follow any instructions from your health care provider about eating or drinking restrictions.  When you feel hungry, start by eating small amounts of foods that are soft and easy to digest (bland), such as toast. Gradually return to your regular diet.  Drink enough fluid to keep your urine pale yellow.  If you vomit, rehydrate by drinking water, juice, or clear broth. General instructions  If you have sleep apnea, surgery and certain medicines can increase your risk for breathing problems. Follow instructions from your health care provider about wearing your sleep device: ? Anytime you are sleeping, including during daytime naps. ? While taking prescription pain medicines, sleeping medicines, or medicines that make you drowsy.  Return to  your normal activities as told by your health care provider. Ask your health care provider what activities are safe for you.  Take over-the-counter and prescription medicines only as told by your health care provider.  If you smoke, do not smoke without supervision.  Keep all follow-up visits as told by your health care provider. This is important. Contact a health care provider if:  You have nausea or vomiting that does not get better with medicine.  You cannot eat or drink without vomiting.  You have pain that does not get better with medicine.  You are unable to pass urine.  You develop a skin rash.  You have a fever.  You have redness around your IV site that gets worse. Get help right away if:  You have difficulty breathing.  You have chest pain.  You have blood in your urine or stool, or you vomit blood. Summary  After the procedure, it is common to have a sore throat or nausea. It is also common to feel tired.  Have a responsible adult stay with you for the first 24 hours after general anesthesia. It is important to have someone help care for you until you are awake and alert.  When you feel hungry, start by eating small amounts of foods that are soft and easy to digest (bland), such as toast. Gradually return to your regular diet.  Drink enough fluid to keep your urine pale yellow.  Return to your normal activities as told by your health care provider. Ask your health care provider what activities are safe for you. This information is not   intended to replace advice given to you by your health care provider. Make sure you discuss any questions you have with your health care provider. Document Revised: 12/25/2016 Document Reviewed: 08/07/2016 Elsevier Patient Education  2020 [employer]  

## 2019-02-14 ENCOUNTER — Ambulatory Visit: Payer: Self-pay | Admitting: Anesthesiology

## 2019-02-14 ENCOUNTER — Other Ambulatory Visit: Payer: Self-pay

## 2019-02-14 ENCOUNTER — Encounter: Payer: Self-pay | Admitting: Gastroenterology

## 2019-02-14 ENCOUNTER — Ambulatory Visit
Admission: RE | Admit: 2019-02-14 | Discharge: 2019-02-14 | Disposition: A | Discharge status: Home / Self Care | Payer: Medicaid Other | Attending: Gastroenterology | Admitting: Gastroenterology

## 2019-02-14 ENCOUNTER — Encounter
Admission: RE | Disposition: A | Discharge status: Home / Self Care | Payer: Self-pay | Source: Home / Self Care | Attending: Gastroenterology

## 2019-02-14 ENCOUNTER — Encounter: Payer: Self-pay | Admitting: Anesthesiology

## 2019-02-14 DIAGNOSIS — D122 Benign neoplasm of ascending colon: Secondary | ICD-10-CM | POA: Insufficient documentation

## 2019-02-14 DIAGNOSIS — Z7951 Long term (current) use of inhaled steroids: Secondary | ICD-10-CM | POA: Insufficient documentation

## 2019-02-14 DIAGNOSIS — Z8673 Personal history of transient ischemic attack (TIA), and cerebral infarction without residual deficits: Secondary | ICD-10-CM | POA: Diagnosis not present

## 2019-02-14 DIAGNOSIS — Z7982 Long term (current) use of aspirin: Secondary | ICD-10-CM | POA: Insufficient documentation

## 2019-02-14 DIAGNOSIS — K640 First degree hemorrhoids: Secondary | ICD-10-CM | POA: Insufficient documentation

## 2019-02-14 DIAGNOSIS — G4730 Sleep apnea, unspecified: Secondary | ICD-10-CM | POA: Insufficient documentation

## 2019-02-14 DIAGNOSIS — Z1211 Encounter for screening for malignant neoplasm of colon: Secondary | ICD-10-CM | POA: Insufficient documentation

## 2019-02-14 DIAGNOSIS — Z79899 Other long term (current) drug therapy: Secondary | ICD-10-CM | POA: Insufficient documentation

## 2019-02-14 DIAGNOSIS — I509 Heart failure, unspecified: Secondary | ICD-10-CM | POA: Insufficient documentation

## 2019-02-14 DIAGNOSIS — E119 Type 2 diabetes mellitus without complications: Secondary | ICD-10-CM | POA: Diagnosis not present

## 2019-02-14 DIAGNOSIS — E785 Hyperlipidemia, unspecified: Secondary | ICD-10-CM | POA: Diagnosis not present

## 2019-02-14 DIAGNOSIS — I110 Hypertensive heart disease with heart failure: Secondary | ICD-10-CM | POA: Insufficient documentation

## 2019-02-14 DIAGNOSIS — D124 Benign neoplasm of descending colon: Secondary | ICD-10-CM | POA: Diagnosis not present

## 2019-02-14 DIAGNOSIS — Z794 Long term (current) use of insulin: Secondary | ICD-10-CM | POA: Insufficient documentation

## 2019-02-14 DIAGNOSIS — Z87891 Personal history of nicotine dependence: Secondary | ICD-10-CM | POA: Insufficient documentation

## 2019-02-14 DIAGNOSIS — J449 Chronic obstructive pulmonary disease, unspecified: Secondary | ICD-10-CM | POA: Diagnosis not present

## 2019-02-14 HISTORY — DX: Sleep apnea, unspecified: G47.30

## 2019-02-14 HISTORY — DX: Dyspnea, unspecified: R06.00

## 2019-02-14 HISTORY — PX: COLONOSCOPY WITH PROPOFOL: SHX5780

## 2019-02-14 HISTORY — DX: Unspecified osteoarthritis, unspecified site: M19.90

## 2019-02-14 LAB — GLUCOSE, CAPILLARY: Glucose-Capillary: 73 mg/dL (ref 70–99)

## 2019-02-14 SURGERY — COLONOSCOPY WITH PROPOFOL
Anesthesia: General

## 2019-02-14 MED ORDER — PROPOFOL 500 MG/50ML IV EMUL
INTRAVENOUS | Status: AC
  Filled 2019-02-14: qty 50

## 2019-02-14 MED ORDER — PROPOFOL 10 MG/ML IV BOLUS
INTRAVENOUS | Status: DC | PRN
  Administered 2019-02-14: 70 mg via INTRAVENOUS

## 2019-02-14 MED ORDER — PROPOFOL 500 MG/50ML IV EMUL
INTRAVENOUS | Status: DC | PRN
  Administered 2019-02-14: 150 ug/kg/min via INTRAVENOUS

## 2019-02-14 MED ORDER — LIDOCAINE HCL (CARDIAC) PF 100 MG/5ML IV SOSY
PREFILLED_SYRINGE | INTRAVENOUS | Status: DC | PRN
  Administered 2019-02-14: 40 mg via INTRAVENOUS

## 2019-02-14 MED ORDER — LIDOCAINE HCL (PF) 2 % IJ SOLN
INTRAMUSCULAR | Status: AC
  Filled 2019-02-14: qty 5

## 2019-02-14 MED ORDER — SODIUM CHLORIDE 0.9 % IV SOLN: INTRAVENOUS | Status: DC

## 2019-02-14 NOTE — Transfer of Care (Signed)
Immediate Anesthesia Transfer of Care Note  Patient: Emily [name]  Procedure(s) Performed: COLONOSCOPY WITH PROPOFOL (N/A )  Patient Location: PACU  Anesthesia Type:General  Level of Consciousness: sedated  Airway & Oxygen Therapy: Patient Spontanous Breathing and Patient connected to face mask oxygen  Post-op Assessment: Report given to RN and Post -op Vital signs reviewed and stable  Post vital signs: Reviewed and stable  Last Vitals:  Vitals Value Taken Time  BP    Temp    Pulse 93 02/14/19 0926  Resp 25 02/14/19 0926  SpO2 99 % 02/14/19 0926  Vitals shown include unvalidated device data.  Last Pain:  Vitals:   02/14/19 0805  TempSrc: Temporal  PainSc: 0-No pain         Complications: No apparent anesthesia complications

## 2019-02-14 NOTE — Anesthesia Preprocedure Evaluation (Signed)
Anesthesia Evaluation  Patient identified by MRN, date of birth, ID band Patient awake    Reviewed: Allergy & Precautions, NPO status , Patient's Chart, lab work & pertinent test results  History of Anesthesia Complications Negative for: history of anesthetic complications  Airway Mallampati: II  TM Distance: >3 FB Neck ROM: Full    Dental  (+) Teeth Intact, Poor Dentition   Pulmonary neg pulmonary ROS, sleep apnea and Continuous Positive Airway Pressure Ventilation , COPD,  COPD inhaler and oxygen dependent, Patient abstained from smoking.Not current smoker, former smoker,  On 2L home O2, but doesn't use it all the time   Pulmonary exam normal breath sounds clear to auscultation       Cardiovascular Exercise Tolerance: Poor METShypertension, +CHF  (-) CAD and (-) Past MI (-) dysrhythmias  Rhythm:Regular Rate:Normal - Systolic murmurs TTE 1194 (limited due to obesity): 1. The left ventricle has normal systolic function of 17-40%. The cavity  size was normal. There is no increased left ventricular wall thickness.  Echo evidence of impaired diastolic relaxation.  2. Grossly, no evidence of left ventricular regional wall motion  abnormalities.  3. The right ventricle has normal systolic function. The cavity was  normal. There is no increase in right ventricular wall thickness.    Neuro/Psych CVA, No Residual Symptoms negative neurological ROS  negative psych ROS   GI/Hepatic neg GERD  ,(+)     (-) substance abuse  ,   Endo/Other  diabetes, Insulin Dependent  Renal/GU CRFRenal diseasenegative Renal ROS     Musculoskeletal   Abdominal   Peds  Hematology   Anesthesia Other Findings Past Medical History: No date: Arthritis     Comment:  hands, all over No date: Asthma No date: CHF (congestive heart failure) ([facility]) No date: COPD (chronic obstructive pulmonary disease) ([facility])     Comment:  on 2L home o2 No date:  Diabetes mellitus without complication ([facility])     Comment:  type 2 No date: Dyspnea No date: Hyperlipidemia No date: Hypertension No date: Sleep apnea     Comment:  CPAP No date: Stroke [facility])     Comment:  Was told at recent hospital visit she has stroke in past No date: Thyroid disease 04/30/2016: Thyroid nodule  Reproductive/Obstetrics                            Anesthesia Physical Anesthesia Plan  ASA: III  Anesthesia Plan: General   Post-op Pain Management:    Induction: Intravenous  PONV Risk Score and Plan: 3 and Ondansetron, Propofol infusion and TIVA  Airway Management Planned: Nasal CPAP  Additional Equipment: None  Intra-op Plan:   Post-operative Plan:   Informed Consent: I have reviewed the patients History and Physical, chart, labs and discussed the procedure including the risks, benefits and alternatives for the proposed anesthesia with the patient or authorized representative who has indicated his/her understanding and acceptance.     Dental advisory given  Plan Discussed with: CRNA and Surgeon  Anesthesia Plan Comments: (Discussed risks of anesthesia with patient, including possibility of difficulty with spontaneous ventilation under anesthesia necessitating airway intervention, PONV, and rare risks such as cardiac or respiratory events. Patient understands. Patient counseled on being higher risk for anesthesia. Patient was told about increased risk of cardiac and respiratory events, including death. Patient understands. )        Anesthesia Quick Evaluation

## 2019-02-14 NOTE — Op Note (Signed)
[facility] Gastroenterology Patient Name: Emily [name] Procedure Date: 02/14/2019 8:55 AM MRN: [id number] Account #: [id number] Date of Birth: 20-Jun-1960 Admit Type: Outpatient Age: 59 Room: [facility] ENDO ROOM 4 Gender: Female Note Status: Finalized Procedure:             Colonoscopy Indications:           Screening for colorectal malignant neoplasm Providers:             [name] MD, MD Referring MD:          No Local Md, MD (Referring MD) Medicines:             Propofol per Anesthesia Complications:         No immediate complications. Procedure:             Pre-Anesthesia Assessment:                        - Prior to the procedure, a History and Physical was                         performed, and patient medications and allergies were                         reviewed. The patient's tolerance of previous                         anesthesia was also reviewed. The risks and benefits                         of the procedure and the sedation options and risks                         were discussed with the patient. All questions were                         answered, and informed consent was obtained. Prior                         Anticoagulants: The patient has taken no previous                         anticoagulant or antiplatelet agents. ASA Grade                         Assessment: III - A patient with severe systemic                         disease. After reviewing the risks and benefits, the                         patient was deemed in satisfactory condition to                         undergo the procedure.                        After obtaining informed consent, the colonoscope was  passed under direct vision. Throughout the procedure,                         the patient's blood pressure, pulse, and oxygen                         saturations were monitored continuously. The                         Colonoscope was introduced through  the anus and                         advanced to the the cecum, identified by appendiceal                         orifice and ileocecal valve. The colonoscopy was                         performed without difficulty. The patient tolerated                         the procedure well. The quality of the bowel                         preparation was good. Findings:      The perianal and digital rectal examinations were normal.      A 3 mm polyp was found in the descending colon. The polyp was sessile.       The polyp was removed with a cold snare. Resection and retrieval were       complete.      A 9 mm polyp was found in the ascending colon. The polyp was       pedunculated. The polyp was removed with a hot snare. Resection and       retrieval were complete. To prevent bleeding post-intervention, one       hemostatic clip was successfully placed (MR conditional). There was no       bleeding at the end of the procedure.      Non-bleeding internal hemorrhoids were found during retroflexion. The       hemorrhoids were Grade I (internal hemorrhoids that do not prolapse). Impression:            - One 3 mm polyp in the descending colon, removed with                         a cold snare. Resected and retrieved.                        - One 9 mm polyp in the ascending colon, removed with                         a hot snare. Resected and retrieved. Clip (MR                         conditional) was placed.                        - Non-bleeding internal hemorrhoids. Recommendation:        - Discharge patient  to home.                        - Resume previous diet.                        - Continue present medications.                        - Await pathology results.                        - Repeat colonoscopy in 5 years if polyp adenoma and                         10 years if hyperplastic Procedure Code(s):     --- Professional ---                        [id number], Colonoscopy, flexible; with removal of                          tumor(s), polyp(s), or other lesion(s) by snare                         technique Diagnosis Code(s):     --- Professional ---                        Z12.11, Encounter for screening for malignant neoplasm                         of colon                        K63.5, Polyp of colon CPT copyright 2019 American Medical Association. All rights reserved. The codes documented in this report are preliminary and upon coder review may  be revised to meet current compliance requirements. [name] MD, MD 02/14/2019 9:25:00 AM This report has been signed electronically. Number of Addenda: 0 Note Initiated On: 02/14/2019 8:55 AM Scope Withdrawal Time: 0 hours 12 minutes 33 seconds  Total Procedure Duration: 0 hours 18 minutes 35 seconds  Estimated Blood Loss:  Estimated blood loss: none.      [facility]

## 2019-02-14 NOTE — Anesthesia Procedure Notes (Signed)
Date/Time: 02/14/2019 8:56 AM Performed by: Doreen Salvage, CRNA Pre-anesthesia Checklist: Patient identified, Emergency Drugs available, Suction available and Patient being monitored Patient Re-evaluated:Patient Re-evaluated prior to induction Oxygen Delivery Method: Supernova nasal CPAP Induction Type: IV induction Dental Injury: Teeth and Oropharynx as per pre-operative assessment  Comments: Nasal cannula with etCO2 monitoring

## 2019-02-14 NOTE — H&P (Signed)
Emily Lame, MD [address] [place], [place] [address] Phone: [phone] Fax : [phone]  Primary Care Physician:  Gennette Pac, FNP Primary Gastroenterologist:  Dr. Allen Norris  Pre-Procedure History & Physical: HPI:  Emily [name] is a 59 y.o. female is here for a screening colonoscopy.   Past Medical History:  Diagnosis Date  . Arthritis    hands, all over  . Asthma   . CHF (congestive heart failure) ([place])   . COPD (chronic obstructive pulmonary disease) (H[facility])    on 2L home o2  . Diabetes mellitus without complication ([place])    type 2  . Dyspnea   . Hyperlipidemia   . Hypertension   . Sleep apnea    CPAP  . Stroke [facility])    Was told at recent hospital visit she has stroke in past  . Thyroid disease   . Thyroid nodule 04/30/2016    Past Surgical History:  Procedure Laterality Date  . arm surgery Left     Prior to Admission medications   Medication Sig Start Date End Date Taking? Authorizing Provider  albuterol (PROVENTIL HFA;VENTOLIN HFA) 108 (90 Base) MCG/ACT inhaler Inhale 1-2 puffs into the lungs every 6 (six) hours as needed for wheezing or shortness of breath. 07/16/16  Yes Doles-Johnson, Teah, NP  albuterol (PROVENTIL) (2.5 MG/3ML) 0.083% nebulizer solution Take 3 mLs (2.5 mg total) by nebulization every 6 (six) hours as needed for wheezing or shortness of breath. 11/18/17  Yes Wieting, Richard, MD  amLODipine (NORVASC) 10 MG tablet Take 10 mg by mouth daily. am   Yes [provider]  aspirin EC 81 MG tablet Take 81 mg by mouth daily.   Yes [provider]  atorvastatin (LIPITOR) 40 MG tablet TAKE ONE TABLET BY MOUTH EVERY DAY Patient taking differently: Take 40 mg by mouth at bedtime.  04/21/16  Yes Tawni Millers, MD  budesonide-formoterol (SYMBICORT) 80-4.5 MCG/ACT inhaler Inhale 2 puffs into the lungs 2 (two) times daily. 11/18/17  Yes Wieting, Richard, MD  carvedilol (COREG) 25 MG tablet Take 1 tablet (25 mg total) by  mouth 2 (two) times daily. Patient taking differently: Take 25 mg by mouth 2 (two) times daily. Am and pm 05/11/18  Yes Hackney, Tina A, FNP  cyclobenzaprine (FLEXERIL) 5 MG tablet TAKE ONE TABLET BY MOUTH EVERY DAY AS NEEDED FOR MUSCLE SPASMS Patient taking differently: Take 5 mg by mouth daily as needed for muscle spasms.  04/20/16  Yes Doles-Johnson, Teah, NP  esomeprazole (NEXIUM) 40 MG capsule Take 40 mg by mouth daily. am   Yes [provider]  Exenatide ER (BYDUREON) 2 MG PEN Inject 2 mg into the skin once a week. 08/05/18  Yes Shamleffer, Melanie Crazier, MD  furosemide (LASIX) 40 MG tablet Take 1 tablet (40 mg total) by mouth 2 (two) times daily. MUST MAKE APPOINTMENT FOR FURTHER REFILLS 01/31/19 01/31/20 Yes Darylene Price A, FNP  insulin aspart (NOVOLOG) 100 UNIT/ML FlexPen Inject 6 units fifteen minutes before breakfast and dinner. Patient taking differently: Inject 10 units fifteen minutes before breakfast and 18 units dinner. 03/04/18  Yes Philemon Kingdom, MD  ipratropium (ATROVENT) 0.06 % nasal spray Place 2 sprays into both nostrils 4 (four) times daily.   Yes [provider]  LANTUS SOLOSTAR 100 UNIT/ML Solostar Pen INJECT 50 UNITS SUBCUTANEOUSLY TWICE DAILY Patient taking differently: INJECT 50 UNITS in Am and 30 at night SUBCUTANEOUSLY TWICE DAILY 11/14/18  Yes Philemon Kingdom, MD  lisinopril (PRINIVIL,ZESTRIL) 5 MG tablet Take  5 mg by mouth daily. AM for high blood pressure 12/23/17  Yes [provider]  methimazole (TAPAZOLE) 5 MG tablet Take 1 tablet (5 mg total) by mouth daily. Patient taking differently: Take 5 mg by mouth daily. am 12/27/18  Yes Philemon Kingdom, MD  tiotropium (SPIRIVA) 18 MCG inhalation capsule Place 18 mcg into inhaler and inhale daily.   Yes [provider]  Acetylcysteine (NAC) 600 MG CAPS Take by mouth daily.    [provider]    Allergies as of 02/01/2019  . (No Known Allergies)    Family History    Problem Relation Age of Onset  . Diabetes Mother   . Hypertension Mother   . Diabetes Father   . Hypertension Father   . CAD Father   . Diabetes Sister     Social History   Socioeconomic History  . Marital status: Single    Spouse name: Not on file  . Number of children: Not on file  . Years of education: Not on file  . Highest education level: Not on file  Occupational History  . Occupation: unemployed  Tobacco Use  . Smoking status: Former Smoker    Packs/day: 1.50    Years: 40.00    Pack years: 60.00    Types: Cigarettes    Quit date: 01/13/2016    Years since quitting: 3.0  . Smokeless tobacco: Never Used  Substance and Sexual Activity  . Alcohol use: No  . Drug use: No  . Sexual activity: Not Currently  Other Topics Concern  . Not on file  Social History Narrative   Lives at home with her mother.  Independent at baseline   Social Determinants of Health   Financial Resource Strain: Low Risk   . Difficulty of Paying Living Expenses: Not hard at all  Food Insecurity: No Food Insecurity  . Worried About Charity fundraiser in the Last Year: Never true  . Ran Out of Food in the Last Year: Never true  Transportation Needs: No Transportation Needs  . Lack of Transportation (Medical): No  . Lack of Transportation (Non-Medical): No  Physical Activity: Inactive  . Days of Exercise per Week: 0 days  . Minutes of Exercise per Session: 0 min  Stress: No Stress Concern Present  . Feeling of Stress : Not at all  Social Connections: Somewhat Isolated  . Frequency of Communication with Friends and Family: Three times a week  . Frequency of Social Gatherings with Friends and Family: Three times a week  . Attends Religious Services: 1 to 4 times per year  . Active Member of Clubs or Organizations: No  . Attends Archivist Meetings: Never  . Marital Status: Never married  Intimate Partner Violence: Not At Risk  . Fear of Current or Ex-Partner: No  . Emotionally  Abused: No  . Physically Abused: No  . Sexually Abused: No    Review of Systems: See HPI, otherwise negative ROS  Physical Exam: BP (!) 160/107   Pulse 96   Temp 97.7 F (36.5 C) (Temporal)   Resp 16   Ht 5\' 3"  (1.6 m)   Wt 99.8 kg   SpO2 93%   BMI 38.97 kg/m  General:   Alert,  pleasant and cooperative in NAD Head:  Normocephalic and atraumatic. Neck:  Supple; no masses or thyromegaly. Lungs:  Clear throughout to auscultation.    Heart:  Regular rate and rhythm. Abdomen:  Soft, nontender and nondistended. Normal bowel sounds, without  guarding, and without rebound.   Neurologic:  Alert and  oriented x4;  grossly normal neurologically.  Impression/Plan: Emily [name] is now here to undergo a screening colonoscopy.  Risks, benefits, and alternatives regarding colonoscopy have been reviewed with the patient.  Questions have been answered.  All parties agreeable.

## 2019-02-15 ENCOUNTER — Encounter: Payer: Self-pay | Admitting: *Deleted

## 2019-02-15 LAB — SURGICAL PATHOLOGY

## 2019-02-15 NOTE — Anesthesia Postprocedure Evaluation (Signed)
Anesthesia Post Note  Patient: Emily [name]  Procedure(s) Performed: COLONOSCOPY WITH PROPOFOL (N/A )  Patient location during evaluation: Endoscopy Anesthesia Type: General Level of consciousness: awake and alert Pain management: pain level controlled Vital Signs Assessment: post-procedure vital signs reviewed and stable Respiratory status: spontaneous breathing, nonlabored ventilation, respiratory function stable and patient connected to nasal cannula oxygen Cardiovascular status: blood pressure returned to baseline and stable Postop Assessment: no apparent nausea or vomiting Anesthetic complications: no     Last Vitals:  Vitals:   02/14/19 0936 02/14/19 0956  BP: 119/83 (!) 158/100  Pulse:    Resp:  (!) 24  Temp:    SpO2: 93%     Last Pain:  Vitals:   02/15/19 0802  TempSrc:   PainSc: 0-No pain                 Arita Miss

## 2019-02-17 ENCOUNTER — Telehealth: Payer: Self-pay

## 2019-02-17 NOTE — Telephone Encounter (Signed)
-----   Message from Eleanora Neighbor, [place] sent at 12/28/2018  1:37 PM EST ----- Regarding: thyroid labs Due for thyroid labs

## 2019-02-20 NOTE — Telephone Encounter (Signed)
Patient notified and lab appt scheduled.  

## 2019-02-27 ENCOUNTER — Ambulatory Visit (INDEPENDENT_AMBULATORY_CARE_PROVIDER_SITE_OTHER): Payer: Medicaid Other

## 2019-02-27 ENCOUNTER — Ambulatory Visit (INDEPENDENT_AMBULATORY_CARE_PROVIDER_SITE_OTHER): Payer: Medicaid Other | Admitting: Vascular Surgery

## 2019-02-27 ENCOUNTER — Other Ambulatory Visit: Payer: Self-pay

## 2019-02-27 ENCOUNTER — Encounter (INDEPENDENT_AMBULATORY_CARE_PROVIDER_SITE_OTHER): Payer: Self-pay | Admitting: Vascular Surgery

## 2019-02-27 VITALS — BP 138/89 | HR 89 | Resp 16 | Ht 66.0 in | Wt 221.0 lb

## 2019-02-27 DIAGNOSIS — J449 Chronic obstructive pulmonary disease, unspecified: Secondary | ICD-10-CM

## 2019-02-27 DIAGNOSIS — M79604 Pain in right leg: Secondary | ICD-10-CM

## 2019-02-27 DIAGNOSIS — I872 Venous insufficiency (chronic) (peripheral): Secondary | ICD-10-CM

## 2019-02-27 DIAGNOSIS — E782 Mixed hyperlipidemia: Secondary | ICD-10-CM | POA: Diagnosis not present

## 2019-02-27 DIAGNOSIS — I10 Essential (primary) hypertension: Secondary | ICD-10-CM

## 2019-02-27 DIAGNOSIS — M79605 Pain in left leg: Secondary | ICD-10-CM | POA: Diagnosis not present

## 2019-02-27 NOTE — Progress Notes (Signed)
MRN : [id number]  Emily [name] is a 59 y.o. (1960-01-22) female who presents with chief complaint of  Chief Complaint  Patient presents with  . Follow-up    ultrasound  .  History of Present Illness:   The patient returns to the office for followup evaluation regarding leg swelling.  The swelling has improved quite a bit and the pain associated with swelling has decreased substantially. There have not been any interval development of a ulcerations or wounds.  Since the previous visit the patient has been wearing graduated compression stockings and has noted little significant improvement in the lymphedema. The patient has been using compression routinely morning until night.  The patient also states elevation during the day and exercise is being done too.  Duplex ultrasound of the venous systemt does not show any significant reflux in the deep or superficial system bilaterally.  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 Acetylcysteine (NAC) 600 MG CAPS Take by mouth daily.  Marland Kitchen albuterol (PROVENTIL HFA;VENTOLIN HFA) 108 (90 Base) MCG/ACT inhaler Inhale 1-2 puffs into the lungs every 6 (six) hours as needed for wheezing or shortness of breath.  Marland Kitchen albuterol (PROVENTIL) (2.5 MG/3ML) 0.083% nebulizer solution Take 3 mLs (2.5 mg total) by nebulization every 6 (six) hours as needed for wheezing or shortness of breath.  Marland Kitchen amLODipine (NORVASC) 10 MG tablet Take 10 mg by mouth daily. am  . aspirin EC 81 MG tablet Take 81 mg by mouth daily.  Marland Kitchen atorvastatin (LIPITOR) 40 MG tablet TAKE ONE TABLET BY MOUTH EVERY DAY (Patient taking differently: Take 40 mg by mouth at bedtime. )  . budesonide-formoterol (SYMBICORT) 80-4.5 MCG/ACT inhaler Inhale 2 puffs into the lungs 2 (two) times daily.  . carvedilol (COREG) 25 MG tablet Take 1 tablet (25 mg total) by mouth 2 (two) times daily. (Patient taking differently: Take 25 mg by mouth 2 (two) times daily. Am and pm)  .  cyclobenzaprine (FLEXERIL) 5 MG tablet TAKE ONE TABLET BY MOUTH EVERY DAY AS NEEDED FOR MUSCLE SPASMS (Patient taking differently: Take 5 mg by mouth daily as needed for muscle spasms. )  . esomeprazole (NEXIUM) 40 MG capsule Take 40 mg by mouth daily. am  . Exenatide ER (BYDUREON) 2 MG PEN Inject 2 mg into the skin once a week.  . furosemide (LASIX) 40 MG tablet Take 1 tablet (40 mg total) by mouth 2 (two) times daily. MUST MAKE APPOINTMENT FOR FURTHER REFILLS  . insulin aspart (NOVOLOG) 100 UNIT/ML FlexPen Inject 6 units fifteen minutes before breakfast and dinner. (Patient taking differently: Inject 10 units fifteen minutes before breakfast and 18 units dinner.)  . ipratropium (ATROVENT) 0.06 % nasal spray Place 2 sprays into both nostrils 4 (four) times daily.  Marland Kitchen LANTUS SOLOSTAR 100 UNIT/ML Solostar Pen INJECT 50 UNITS SUBCUTANEOUSLY TWICE DAILY (Patient taking differently: INJECT 50 UNITS in Am and 30 at night SUBCUTANEOUSLY TWICE DAILY)  . lisinopril (PRINIVIL,ZESTRIL) 5 MG tablet Take 5 mg by mouth daily. AM for high blood pressure  . methimazole (TAPAZOLE) 5 MG tablet Take 1 tablet (5 mg total) by mouth daily. (Patient taking differently: Take 5 mg by mouth daily. am)  . OZEMPIC, 0.25 OR 0.5 MG/DOSE, 2 MG/1.5ML SOPN Inject 0.5 mg into the skin once a week.  . tiotropium (SPIRIVA) 18 MCG inhalation capsule Place 18 mcg into inhaler and inhale daily.    Past Medical History:  Diagnosis Date  . Arthritis  hands, all over  . Asthma   . CHF (congestive heart failure) ([place])   . COPD (chronic obstructive pulmonary disease) (H[facility])    on 2L home o2  . Diabetes mellitus without complication ([place])    type 2  . Dyspnea   . Hyperlipidemia   . Hypertension   . Sleep apnea    CPAP  . Stroke [facility])    Was told at recent hospital visit she has stroke in past  . Thyroid disease   . Thyroid nodule 04/30/2016    Past Surgical History:  Procedure Laterality Date  . arm surgery Left   .  COLONOSCOPY WITH PROPOFOL N/A 02/14/2019   Procedure: COLONOSCOPY WITH PROPOFOL;  Surgeon: [name], MD;  Location: [facility] ENDOSCOPY;  Service: Endoscopy;  Laterality: N/A;    Social History Social History   Tobacco Use  . Smoking status: Former Smoker    Packs/day: 1.50    Years: 40.00    Pack years: 60.00    Types: Cigarettes    Quit date: 01/13/2016    Years since quitting: 3.1  . Smokeless tobacco: Never Used  Substance Use Topics  . Alcohol use: No  . Drug use: No    Family History Family History  Problem Relation Age of Onset  . Diabetes Mother   . Hypertension Mother   . Diabetes Father   . Hypertension Father   . CAD Father   . Diabetes Sister     No Known Allergies   REVIEW OF SYSTEMS (Negative unless checked)  Constitutional: [] Weight loss  [] Fever  [] Chills Cardiac: [] Chest pain   [] Chest pressure   [] Palpitations   [] Shortness of breath when laying flat   [] Shortness of breath with exertion. Vascular:  [] Pain in legs with walking   [x] Pain in legs at rest  [] History of DVT   [] Phlebitis   [x] Swelling in legs   [] Varicose veins   [] Non-healing ulcers Pulmonary:   [] Uses home oxygen   [] Productive cough   [] Hemoptysis   [] Wheeze  [] COPD   [] Asthma Neurologic:  [] Dizziness   [] Seizures   [] History of stroke   [] History of TIA  [] Aphasia   [] Vissual changes   [] Weakness or numbness in arm   [] Weakness or numbness in leg Musculoskeletal:   [] Joint swelling   [x] Joint pain   [] Low back pain Hematologic:  [] Easy bruising  [] Easy bleeding   [] Hypercoagulable state   [] Anemic Gastrointestinal:  [] Diarrhea   [] Vomiting  [] Gastroesophageal reflux/heartburn   [] Difficulty swallowing. Genitourinary:  [] Chronic kidney disease   [] Difficult urination  [] Frequent urination   [] Blood in urine Skin:  [] Rashes   [] Ulcers  Psychological:  [] History of anxiety   []  History of major depression.  Physical Examination  Vitals:   02/27/19 1148  BP: 138/89  Pulse: 89  Resp: 16    Weight: 221 lb (100.2 kg)  Height: 5\' 6"  (1.676 m)   Body mass index is 35.67 kg/m. Gen: WD/WN, NAD Head: [place]/AT, No temporalis wasting.  Ear/Nose/Throat: Hearing grossly intact, nares w/o erythema or drainage Eyes: PER, EOMI, sclera nonicteric.  Neck: Supple, no large masses.   Pulmonary:  Good air movement, no audible wheezing bilaterally, no use of accessory muscles.  Cardiac: RRR, no JVD Vascular: scattered varicosities present bilaterally.  Mild venous stasis changes to the legs bilaterally.  2+ soft pitting edema Vessel Right Left  Radial Palpable Palpable  Gastrointestinal: Non-distended. No guarding/no peritoneal signs.  Musculoskeletal: M/S 5/5 throughout.  No deformity or atrophy.  Neurologic: CN  2-12 intact. Symmetrical.  Speech is fluent. Motor exam as listed above. Psychiatric: Judgment intact, Mood & affect appropriate for pt's clinical situation. Dermatologic: No rashes or ulcers noted.  No changes consistent with cellulitis.  CBC Lab Results  Component Value Date   WBC 7.7 02/17/2018   HGB 16.5 (H) 02/17/2018   HCT 53.6 (H) 02/17/2018   MCV 91.8 02/17/2018   PLT 198 02/17/2018    BMET    Component Value Date/Time   NA 143 04/05/2018 1008   NA 142 05/14/2016 1852   NA 140 06/02/2013 1230   K 3.9 04/05/2018 1008   K 3.5 06/02/2013 1230   CL 105 04/05/2018 1008   CL 107 06/02/2013 1230   CO2 28 04/05/2018 1008   CO2 26 06/02/2013 1230   GLUCOSE 180 (H) 04/05/2018 1008   GLUCOSE 178 (H) 06/02/2013 1230   BUN 33 (H) 04/05/2018 1008   BUN 27 (H) 05/14/2016 1852   BUN 14 06/02/2013 1230   CREATININE 1.87 (H) 04/05/2018 1008   CALCIUM 9.1 04/05/2018 1008   CALCIUM 9.5 06/02/2013 1230   GFRNONAA 29 (L) 04/05/2018 1008   GFRAA 34 (L) 04/05/2018 1008   CrCl cannot be calculated (Patient's most recent lab result is older than the maximum 21 days allowed.).  COAG Lab Results  Component Value Date   INR 0.94 08/13/2016    Radiology No results  found.   Assessment/Plan 1. Chronic venous insufficiency No surgery or intervention at this point in time.  I have reviewed my discussion with the patient regarding venous insufficiency and why it causes symptoms. I have discussed with the patient the chronic skin changes that accompany venous insufficiency and the long term sequela such as ulceration. Patient will contnue wearing graduated compression stockings on a daily basis, as this has provided excellent control of his edema. The patient will put the stockings on first thing in the morning and removing them in the evening. The patient is reminded not to sleep in the stockings.  In addition, behavioral modification including elevation during the day will be initiated. Exercise is strongly encouraged.  Duplex ultrasound of both lower extremity shows patent venous system no reflux identified.  Bilateral Baker's cysts noted.  Defer treatment to Orthopedics  Given the patient's good control and lack of any problems regarding the venous insufficiency and lymphedema a lymph pump in not need at this time.  The patient will follow up with me PRN should anything change.  The patient voices agreement with this plan.   2. Essential hypertension Continue antihypertensive medications as already ordered, these medications have been reviewed and there are no changes at this time.   3. Chronic obstructive pulmonary disease, unspecified COPD type ([place]) Continue pulmonary medications and aerosols as already ordered, these medications have been reviewed and there are no changes at this time.    4. Mixed hyperlipidemia Continue statin as ordered and reviewed, no changes at this time     Hortencia Pilar, MD  02/27/2019 11:58 AM

## 2019-03-02 ENCOUNTER — Other Ambulatory Visit: Payer: Self-pay | Admitting: Internal Medicine

## 2019-03-02 ENCOUNTER — Encounter (INDEPENDENT_AMBULATORY_CARE_PROVIDER_SITE_OTHER): Payer: Self-pay | Admitting: Vascular Surgery

## 2019-03-02 DIAGNOSIS — E108 Type 1 diabetes mellitus with unspecified complications: Secondary | ICD-10-CM

## 2019-03-06 ENCOUNTER — Other Ambulatory Visit: Payer: Self-pay

## 2019-03-06 ENCOUNTER — Other Ambulatory Visit (INDEPENDENT_AMBULATORY_CARE_PROVIDER_SITE_OTHER): Payer: Medicaid Other

## 2019-03-06 ENCOUNTER — Telehealth: Payer: Self-pay

## 2019-03-06 DIAGNOSIS — E0590 Thyrotoxicosis, unspecified without thyrotoxic crisis or storm: Secondary | ICD-10-CM | POA: Diagnosis not present

## 2019-03-06 LAB — T4, FREE: Free T4: 0.75 ng/dL (ref 0.60–1.60)

## 2019-03-06 LAB — TSH: TSH: 2.09 u[IU]/mL (ref 0.35–4.50)

## 2019-03-06 LAB — T3, FREE: T3, Free: 3 pg/mL (ref 2.3–4.2)

## 2019-03-06 NOTE — Telephone Encounter (Signed)
-----   Message from [name], MD sent at 02/16/2019 11:33 AM EST ----- Please have the patient come in for a follow up.

## 2019-03-06 NOTE — Telephone Encounter (Signed)
Left vm to schedule a follow up appt to discuss procedure results per Dr. Allen Norris.

## 2019-03-09 ENCOUNTER — Encounter: Payer: Self-pay | Admitting: Podiatry

## 2019-03-09 ENCOUNTER — Ambulatory Visit: Payer: Medicaid Other | Admitting: Podiatry

## 2019-03-09 ENCOUNTER — Other Ambulatory Visit: Payer: Self-pay

## 2019-03-09 DIAGNOSIS — M79675 Pain in left toe(s): Secondary | ICD-10-CM | POA: Diagnosis not present

## 2019-03-09 DIAGNOSIS — B351 Tinea unguium: Secondary | ICD-10-CM | POA: Diagnosis not present

## 2019-03-09 DIAGNOSIS — M79674 Pain in right toe(s): Secondary | ICD-10-CM

## 2019-03-09 DIAGNOSIS — I872 Venous insufficiency (chronic) (peripheral): Secondary | ICD-10-CM

## 2019-03-09 DIAGNOSIS — E1159 Type 2 diabetes mellitus with other circulatory complications: Secondary | ICD-10-CM

## 2019-03-09 NOTE — Progress Notes (Signed)
This patient presents to the office with chief complaint of long thick nails and diabetic feet.  This patient  says there  is  no pain and discomfort in her  feet.  This patient says there are long thick painful nails.  These nails are painful walking and wearing shoes.  Patient has no history of infection or drainage from both feet.  Patient is unable to  self treat his own nails . This patient presents  to the office today for treatment of the  long nails and a foot evaluation due to history of  Diabetes.  Patient has kidney disease.  Patient has problem list of DM with circulatory disorder.  General Appearance  Alert, conversant and in no acute stress.  Vascular  Dorsalis pedis and posterior tibial  pulses are  weakly  palpable  bilaterally.  Capillary return is within normal limits  bilaterally. Temperature is within normal limits  bilaterally.  Neurologic  Senn-Weinstein monofilament wire test within normal limits  bilaterally. Muscle power within normal limits bilaterally.  Nails Thick disfigured discolored nails with subungual debris  from hallux to fifth toes bilaterally. No evidence of bacterial infection or drainage bilaterally.  Orthopedic  No limitations of motion of motion feet .  No crepitus or effusions noted.  No bony pathology or digital deformities noted.  Skin  normotropic skin with no porokeratosis noted bilaterally.  No signs of infections or ulcers noted.     Onychomycosis  Diabetes with vascular pathology.    IE  Debride nails x 10.  A diabetic foot exam was performed and there is no evidence of any   neurologic pathology.   DM with circulatory disorder. RTC 3 months.   Gardiner Barefoot DPM

## 2019-03-14 ENCOUNTER — Other Ambulatory Visit: Payer: Self-pay | Admitting: Family

## 2019-03-21 ENCOUNTER — Other Ambulatory Visit: Payer: Self-pay | Admitting: Family

## 2019-03-23 ENCOUNTER — Other Ambulatory Visit: Payer: Self-pay | Admitting: Family

## 2019-03-24 ENCOUNTER — Other Ambulatory Visit: Payer: Self-pay | Admitting: Family

## 2019-04-01 ENCOUNTER — Ambulatory Visit: Payer: Medicaid Other | Attending: Internal Medicine

## 2019-04-01 DIAGNOSIS — Z23 Encounter for immunization: Secondary | ICD-10-CM

## 2019-04-01 NOTE — Progress Notes (Signed)
   Covid-19 Vaccination Clinic  Name:  Emily [name]    MRN: [id number] DOB: [birth year]  04/01/2019  [name] was observed post Covid-19 immunization for 15 minutes without incident. She was provided with Vaccine Information Sheet and instruction to access the V-Safe system.   [name] was instructed to call 911 with any severe reactions post vaccine: Marland Kitchen Difficulty breathing  . Swelling of face and throat  . A fast heartbeat  . A bad rash all over body  . Dizziness and weakness   Immunizations Administered    Name Date Dose VIS Date Route   Pfizer COVID-19 Vaccine 04/01/2019 10:22 AM 0.3 mL 12/16/2018 Intramuscular   Manufacturer: [place]   Lot: [id number]   [place]: [id number]

## 2019-04-05 NOTE — Progress Notes (Signed)
Patient ID: Emily [name], female    DOB: 1960/05/20, 59 y.o.   MRN: [id number]  HPI  Ms Lapaglia is a 59 y/o female with a history of asthma, DM, hyperlipidemia, HTN, stroke, thyroid disease, COPD, sleep apnea, former tobacco use and chronic heart failure.   Echo report from 02/13/2018 reviewed and showed an EF of 60-65% without LVH.    Has not been admitted or been in the ED in the last 6 months.   She presents today for a follow-up visit with a chief complaint of moderate fatigue upon minimal exertion. She describes this as chronic in nature having been present for several years. She has associated shortness of breath and pedal edema along with this. She denies any difficulty sleeping, dizziness, abdominal distention, palpitations, chest pain, wheezing, cough or weight gain.   Is supposed to be wearing oxygen at 4L but arrived without it. Her room air sat was 84% but quickly rose to 96% when placed on 4L in the office. Explained that when her oxygen gets that low, it puts a lot of stress on her heart.   She is supposed to be wearing CPAP nightly but it's unclear from her answer if she is. Explained how CPAP is supposed to help her heart and that she should wear it anytime she is asleep.  Past Medical History:  Diagnosis Date  . Arthritis    hands, all over  . Asthma   . CHF (congestive heart failure) ([place])   . COPD (chronic obstructive pulmonary disease) ([facility])    on 2L home o2  . Diabetes mellitus without complication ([place])    type 2  . Dyspnea   . Hyperlipidemia   . Hypertension   . Sleep apnea    CPAP  . Stroke [facility])    Was told at recent hospital visit she has stroke in past  . Thyroid disease   . Thyroid nodule 04/30/2016   Past Surgical History:  Procedure Laterality Date  . arm surgery Left   . COLONOSCOPY WITH PROPOFOL N/A 02/14/2019   Procedure: COLONOSCOPY WITH PROPOFOL;  Surgeon: [name], MD;  Location: [facility] ENDOSCOPY;  Service: Endoscopy;  Laterality: N/A;    Family History  Problem Relation Age of Onset  . Diabetes Mother   . Hypertension Mother   . Diabetes Father   . Hypertension Father   . CAD Father   . Diabetes Sister    Social History   Tobacco Use  . Smoking status: Former Smoker    Packs/day: 1.50    Years: 40.00    Pack years: 60.00    Types: Cigarettes    Quit date: 01/13/2016    Years since quitting: 3.2  . Smokeless tobacco: Never Used  Substance Use Topics  . Alcohol use: No   No Known Allergies  Prior to Admission medications   Medication Sig Start Date End Date Taking? Authorizing Provider  Acetylcysteine (NAC) 600 MG CAPS Take by mouth daily.   Yes [provider]  albuterol (PROVENTIL HFA;VENTOLIN HFA) 108 (90 Base) MCG/ACT inhaler Inhale 1-2 puffs into the lungs every 6 (six) hours as needed for wheezing or shortness of breath. 07/16/16  Yes Doles-Johnson, Teah, NP  albuterol (PROVENTIL) (2.5 MG/3ML) 0.083% nebulizer solution Take 3 mLs (2.5 mg total) by nebulization every 6 (six) hours as needed for wheezing or shortness of breath. 11/18/17  Yes Wieting, Richard, MD  amLODipine (NORVASC) 10 MG tablet Take 10 mg by mouth daily. am   Yes [provider]  aspirin EC 81 MG tablet Take 81 mg by mouth daily.   Yes [provider]  atorvastatin (LIPITOR) 40 MG tablet TAKE ONE TABLET BY MOUTH EVERY DAY Patient taking differently: Take 40 mg by mouth at bedtime.  04/21/16  Yes Tawni Millers, MD  budesonide-formoterol (SYMBICORT) 80-4.5 MCG/ACT inhaler Inhale 2 puffs into the lungs 2 (two) times daily. 11/18/17  Yes Wieting, Richard, MD  carvedilol (COREG) 25 MG tablet Take 1 tablet (25 mg total) by mouth 2 (two) times daily. Patient taking differently: Take 25 mg by mouth 2 (two) times daily. Am and pm 05/11/18  Yes Jiovanna Frei A, FNP  cyclobenzaprine (FLEXERIL) 5 MG tablet TAKE ONE TABLET BY MOUTH EVERY DAY AS NEEDED FOR MUSCLE SPASMS Patient taking differently: Take 5 mg by mouth daily as  needed for muscle spasms.  04/20/16  Yes Doles-Johnson, Teah, NP  esomeprazole (NEXIUM) 40 MG capsule Take 40 mg by mouth daily. am   Yes [provider]  Exenatide ER (BYDUREON) 2 MG PEN Inject 2 mg into the skin once a week. 08/05/18  Yes Shamleffer, Melanie Crazier, MD  furosemide (LASIX) 40 MG tablet Take 1 tablet (40 mg total) by mouth 2 (two) times daily. MUST MAKE APPOINTMENT FOR FURTHER REFILLS 01/31/19 01/31/20 Yes Darylene Price A, FNP  insulin aspart (NOVOLOG) 100 UNIT/ML FlexPen Inject 6 units fifteen minutes before breakfast and dinner. Patient taking differently: 6 units three times/ day 03/04/18  Yes Philemon Kingdom, MD  Insulin Glargine (LANTUS SOLOSTAR) 100 UNIT/ML Solostar Pen INJECT 50 UNITS in Am and 30 at night SUBCUTANEOUSLY TWICE DAILY 03/02/19  Yes Philemon Kingdom, MD  ipratropium (ATROVENT) 0.06 % nasal spray Place 2 sprays into both nostrils 4 (four) times daily.   Yes [provider]  methimazole (TAPAZOLE) 5 MG tablet Take 1 tablet (5 mg total) by mouth daily. Patient taking differently: Take 5 mg by mouth daily. am 12/27/18  Yes Philemon Kingdom, MD  OZEMPIC, 0.25 OR 0.5 MG/DOSE, 2 MG/1.5ML SOPN Inject 0.5 mg into the skin once a week. 12/12/18  Yes [provider]  potassium chloride (KLOR-CON) 10 MEQ tablet Take 10 mEq by mouth daily.   Yes [provider]  tiotropium (SPIRIVA) 18 MCG inhalation capsule Place 18 mcg into inhaler and inhale daily.   Yes [provider]    Review of Systems  Constitutional: Positive for fatigue (easily). Negative for appetite change.  HENT: Negative for congestion, postnasal drip and sore throat.   Eyes: Positive for visual disturbance ("blurry vision").  Respiratory: Positive for shortness of breath. Negative for cough and wheezing.   Cardiovascular: Positive for leg swelling. Negative for chest pain and palpitations.  Gastrointestinal: Negative for abdominal distention and abdominal pain.   Endocrine: Negative.   Genitourinary: Negative.   Musculoskeletal: Negative for back pain and neck pain.  Skin: Negative.   Allergic/Immunologic: Negative.   Neurological: Negative for dizziness and light-headedness.  Hematological: Negative for adenopathy. Does not bruise/bleed easily.  Psychiatric/Behavioral: Negative for dysphoric mood and sleep disturbance (sleeping on 2-3 pillows). The patient is not nervous/anxious.    Vitals:   04/06/19 0855 04/06/19 0902  BP: (!) 143/90   Pulse: 81   Resp: 18   SpO2: (!) 84% 96%  Weight: 228 lb 2 oz (103.5 kg)   Height: 5\' 4"  (1.626 m)    Wt Readings from Last 3 Encounters:  04/06/19 228 lb 2 oz (103.5 kg)  02/27/19 221 lb (100.2 kg)  02/14/19 220 lb (99.8  kg)   Lab Results  Component Value Date   CREATININE 1.87 (H) 04/05/2018   CREATININE 2.46 (H) 02/18/2018   CREATININE 2.46 (H) 02/14/2018     Physical Exam Vitals and nursing note reviewed.  Constitutional:      Appearance: Normal appearance.  HENT:     Head: Normocephalic and atraumatic.  Cardiovascular:     Rate and Rhythm: Normal rate and regular rhythm.  Pulmonary:     Effort: Pulmonary effort is normal. No respiratory distress.     Breath sounds: No wheezing or rales.  Abdominal:     General: There is no distension.     Palpations: Abdomen is soft.  Musculoskeletal:        General: No tenderness.     Cervical back: Normal range of motion and neck supple.     Right lower leg: Edema (trace pitting) present.     Left lower leg: Edema (1+ pitting) present.  Skin:    General: Skin is warm and dry.  Neurological:     General: No focal deficit present.     Mental Status: She is alert and oriented to person, place, and time.  Psychiatric:        Mood and Affect: Mood normal.        Behavior: Behavior normal.        Thought Content: Thought content normal.     Assessment & Plan:  1: Chronic heart failure with preserved ejection fraction- - NYHA class III -  euvolemic today - hasn't been weighing at home; encouraged her to resume weighing daily and to call for an overnight weight gain of >2 pounds or a weekly weight gain of >5 pounds - not adding salt and has been using Mrs. Dash for seasoning. - wearing compression socks daily with removal at bedtime - will resume furosemide 20mg  on M, W, F along with potassium 85meq on M, W, F as well - BNP 02/12/2018 was 663.0  2: HTN- - BP mildly elevated today - saw PCP Patrici Ranks) 12/26/2018 - BMP from 04/05/2018 reviewed and showed sodium 143, potassium 3.9, creatinine 1.87 and GFR 34  3: COPD- - saw pulmonology Lanney Gins) 01/18/19 - supposed to be wearing oxygen at 4L around the clock although did not arrive with it on  4: Obstructive sleep apnea- - has CPAP but it's unclear if she's wearing it consistently or not - encouraged her to wear it every time she is asleep  5: DM with CKD- - nonfasting glucose in clinic today was 119; says that she's been taking her novolog BID instead of before each meal; explained that she needed to use it before each meal  - A1c 12/26/2018 was 7.2% - saw endocrinology Cruzita Lederer) 12/30/17 - saw nephrology Smith Mince) 03/30/19 & returns in 1 month    Medication bottles were reviewed although patient says that she didn't bring everything with her.   Return in 1 month or sooner for any questions/problems before then.

## 2019-04-06 ENCOUNTER — Encounter: Payer: Self-pay | Admitting: Family

## 2019-04-06 ENCOUNTER — Other Ambulatory Visit: Payer: Self-pay

## 2019-04-06 ENCOUNTER — Ambulatory Visit: Payer: Medicaid Other | Attending: Family | Admitting: Family

## 2019-04-06 VITALS — BP 143/90 | HR 81 | Resp 18 | Ht 64.0 in | Wt 228.1 lb

## 2019-04-06 DIAGNOSIS — J449 Chronic obstructive pulmonary disease, unspecified: Secondary | ICD-10-CM | POA: Diagnosis not present

## 2019-04-06 DIAGNOSIS — J45909 Unspecified asthma, uncomplicated: Secondary | ICD-10-CM | POA: Insufficient documentation

## 2019-04-06 DIAGNOSIS — E785 Hyperlipidemia, unspecified: Secondary | ICD-10-CM | POA: Diagnosis not present

## 2019-04-06 DIAGNOSIS — Z8249 Family history of ischemic heart disease and other diseases of the circulatory system: Secondary | ICD-10-CM | POA: Insufficient documentation

## 2019-04-06 DIAGNOSIS — I130 Hypertensive heart and chronic kidney disease with heart failure and stage 1 through stage 4 chronic kidney disease, or unspecified chronic kidney disease: Secondary | ICD-10-CM | POA: Insufficient documentation

## 2019-04-06 DIAGNOSIS — Z7982 Long term (current) use of aspirin: Secondary | ICD-10-CM | POA: Insufficient documentation

## 2019-04-06 DIAGNOSIS — Z794 Long term (current) use of insulin: Secondary | ICD-10-CM | POA: Diagnosis not present

## 2019-04-06 DIAGNOSIS — I5032 Chronic diastolic (congestive) heart failure: Secondary | ICD-10-CM | POA: Diagnosis present

## 2019-04-06 DIAGNOSIS — Z87891 Personal history of nicotine dependence: Secondary | ICD-10-CM | POA: Diagnosis not present

## 2019-04-06 DIAGNOSIS — G4733 Obstructive sleep apnea (adult) (pediatric): Secondary | ICD-10-CM | POA: Insufficient documentation

## 2019-04-06 DIAGNOSIS — E079 Disorder of thyroid, unspecified: Secondary | ICD-10-CM | POA: Insufficient documentation

## 2019-04-06 DIAGNOSIS — Z79899 Other long term (current) drug therapy: Secondary | ICD-10-CM | POA: Insufficient documentation

## 2019-04-06 DIAGNOSIS — N189 Chronic kidney disease, unspecified: Secondary | ICD-10-CM | POA: Diagnosis not present

## 2019-04-06 DIAGNOSIS — Z8673 Personal history of transient ischemic attack (TIA), and cerebral infarction without residual deficits: Secondary | ICD-10-CM | POA: Insufficient documentation

## 2019-04-06 DIAGNOSIS — I10 Essential (primary) hypertension: Secondary | ICD-10-CM

## 2019-04-06 DIAGNOSIS — Z7951 Long term (current) use of inhaled steroids: Secondary | ICD-10-CM | POA: Insufficient documentation

## 2019-04-06 DIAGNOSIS — E1122 Type 2 diabetes mellitus with diabetic chronic kidney disease: Secondary | ICD-10-CM | POA: Diagnosis not present

## 2019-04-06 DIAGNOSIS — N1832 Chronic kidney disease, stage 3b: Secondary | ICD-10-CM

## 2019-04-06 LAB — GLUCOSE, CAPILLARY: Glucose-Capillary: 119 mg/dL — ABNORMAL HIGH (ref 70–99)

## 2019-04-06 MED ORDER — FUROSEMIDE 20 MG PO TABS: 20.0000 mg | ORAL_TABLET | ORAL | 3 refills | Status: DC

## 2019-04-06 NOTE — Patient Instructions (Addendum)
Resume weighing daily and call for an overnight weight gain of > 2 pounds or a weekly weight gain of >5 pounds.    Begin furosemide (lasix) and take on Monday, Wednesday and Friday. On those day, you will take potassium as well.

## 2019-04-17 ENCOUNTER — Other Ambulatory Visit: Payer: Self-pay | Admitting: Internal Medicine

## 2019-04-19 ENCOUNTER — Other Ambulatory Visit: Payer: Self-pay

## 2019-04-19 MED ORDER — OZEMPIC (0.25 OR 0.5 MG/DOSE) 2 MG/1.5ML ~~LOC~~ SOPN: 0.5000 mg | PEN_INJECTOR | SUBCUTANEOUS | 2 refills | Status: DC

## 2019-04-22 ENCOUNTER — Ambulatory Visit: Payer: Medicaid Other | Attending: Internal Medicine

## 2019-04-22 DIAGNOSIS — Z23 Encounter for immunization: Secondary | ICD-10-CM

## 2019-04-22 NOTE — Progress Notes (Signed)
   Covid-19 Vaccination Clinic  Name:  Paullette Mckain    MRN: [id number] DOB: Nov 17, 1960  04/22/2019  Ms. Rosenkranz was observed post Covid-19 immunization for 15 minutes   without incident. She was provided with Vaccine Information Sheet and instruction to access the V-Safe system.   Ms. Conrow was instructed to call 911 with any severe reactions post vaccine: Marland Kitchen Difficulty breathing  . Swelling of face and throat  . A fast heartbeat  . A bad rash all over body  . Dizziness and weakness   Immunizations Administered    Name Date Dose VIS Date Route   Pfizer COVID-19 Vaccine 04/22/2019 10:12 AM 0.3 mL 12/16/2018 Intramuscular   Manufacturer: [place]   Lot: [id number]   [place]: [id number]

## 2019-04-24 ENCOUNTER — Telehealth: Payer: Self-pay

## 2019-04-24 NOTE — Telephone Encounter (Signed)
PA for Ozempic has been denied, patient must try and fail all 3 preferred Victoza, Byetta, Bydureon.

## 2019-04-25 ENCOUNTER — Emergency Department: Payer: Medicaid Other

## 2019-04-25 ENCOUNTER — Other Ambulatory Visit: Payer: Self-pay

## 2019-04-25 ENCOUNTER — Encounter: Payer: Self-pay | Admitting: Emergency Medicine

## 2019-04-25 ENCOUNTER — Emergency Department
Admission: EM | Admit: 2019-04-25 | Discharge: 2019-04-25 | Disposition: A | Discharge status: Home / Self Care | Payer: Medicaid Other | Attending: Student in an Organized Health Care Education/Training Program | Admitting: Student in an Organized Health Care Education/Training Program

## 2019-04-25 DIAGNOSIS — I509 Heart failure, unspecified: Secondary | ICD-10-CM | POA: Insufficient documentation

## 2019-04-25 DIAGNOSIS — Z7982 Long term (current) use of aspirin: Secondary | ICD-10-CM | POA: Insufficient documentation

## 2019-04-25 DIAGNOSIS — J441 Chronic obstructive pulmonary disease with (acute) exacerbation: Secondary | ICD-10-CM | POA: Diagnosis not present

## 2019-04-25 DIAGNOSIS — N189 Chronic kidney disease, unspecified: Secondary | ICD-10-CM | POA: Insufficient documentation

## 2019-04-25 DIAGNOSIS — E1122 Type 2 diabetes mellitus with diabetic chronic kidney disease: Secondary | ICD-10-CM | POA: Insufficient documentation

## 2019-04-25 DIAGNOSIS — R0602 Shortness of breath: Secondary | ICD-10-CM | POA: Diagnosis present

## 2019-04-25 DIAGNOSIS — Z87891 Personal history of nicotine dependence: Secondary | ICD-10-CM | POA: Insufficient documentation

## 2019-04-25 DIAGNOSIS — Z79899 Other long term (current) drug therapy: Secondary | ICD-10-CM | POA: Insufficient documentation

## 2019-04-25 DIAGNOSIS — I130 Hypertensive heart and chronic kidney disease with heart failure and stage 1 through stage 4 chronic kidney disease, or unspecified chronic kidney disease: Secondary | ICD-10-CM | POA: Insufficient documentation

## 2019-04-25 DIAGNOSIS — Z794 Long term (current) use of insulin: Secondary | ICD-10-CM | POA: Insufficient documentation

## 2019-04-25 LAB — CBC
HCT: 39.8 % (ref 36.0–46.0)
Hemoglobin: 11.8 g/dL — ABNORMAL LOW (ref 12.0–15.0)
MCH: 27.3 pg (ref 26.0–34.0)
MCHC: 29.6 g/dL — ABNORMAL LOW (ref 30.0–36.0)
MCV: 92.1 fL (ref 80.0–100.0)
Platelets: 204 10*3/uL (ref 150–400)
RBC: 4.32 MIL/uL (ref 3.87–5.11)
RDW: 13.5 % (ref 11.5–15.5)
WBC: 7 10*3/uL (ref 4.0–10.5)
nRBC: 0 % (ref 0.0–0.2)

## 2019-04-25 LAB — BASIC METABOLIC PANEL
Anion gap: 6 (ref 5–15)
BUN: 30 mg/dL — ABNORMAL HIGH (ref 6–20)
CO2: 28 mmol/L (ref 22–32)
Calcium: 8.5 mg/dL — ABNORMAL LOW (ref 8.9–10.3)
Chloride: 110 mmol/L (ref 98–111)
Creatinine, Ser: 2.65 mg/dL — ABNORMAL HIGH (ref 0.44–1.00)
GFR calc Af Amer: 22 mL/min — ABNORMAL LOW (ref >60)
GFR calc non Af Amer: 19 mL/min — ABNORMAL LOW (ref >60)
Glucose, Bld: 92 mg/dL (ref 70–99)
Potassium: 4.9 mmol/L (ref 3.5–5.1)
Sodium: 144 mmol/L (ref 135–145)

## 2019-04-25 LAB — BRAIN NATRIURETIC PEPTIDE: B Natriuretic Peptide: 275 pg/mL — ABNORMAL HIGH (ref 0.0–100.0)

## 2019-04-25 LAB — TROPONIN I (HIGH SENSITIVITY)
Troponin I (High Sensitivity): 30 ng/L — ABNORMAL HIGH (ref <18)
Troponin I (High Sensitivity): 31 ng/L — ABNORMAL HIGH (ref <18)

## 2019-04-25 MED ORDER — PREDNISONE 20 MG PO TABS: 40.0000 mg | ORAL_TABLET | Freq: Every day | ORAL | 0 refills | Status: AC

## 2019-04-25 MED ORDER — FUROSEMIDE 10 MG/ML IJ SOLN
40.0000 mg | Freq: Once | INTRAMUSCULAR | Status: AC
  Administered 2019-04-25: 40 mg via INTRAVENOUS
  Filled 2019-04-25: qty 4

## 2019-04-25 MED ORDER — IPRATROPIUM-ALBUTEROL 0.5-2.5 (3) MG/3ML IN SOLN
3.0000 mL | Freq: Once | RESPIRATORY_TRACT | Status: AC
  Administered 2019-04-25: 3 mL via RESPIRATORY_TRACT
  Filled 2019-04-25: qty 3

## 2019-04-25 MED ORDER — BYDUREON BCISE 2 MG/0.85ML ~~LOC~~ AUIJ: 2.0000 mg | AUTO-INJECTOR | SUBCUTANEOUS | 3 refills | Status: DC

## 2019-04-25 MED ORDER — FUROSEMIDE 10 MG/ML IJ SOLN: 60.0000 mg | Freq: Once | INTRAMUSCULAR | Status: DC

## 2019-04-25 MED ORDER — METHYLPREDNISOLONE SODIUM SUCC 125 MG IJ SOLR
125.0000 mg | Freq: Once | INTRAMUSCULAR | Status: AC
  Administered 2019-04-25: 125 mg via INTRAVENOUS
  Filled 2019-04-25: qty 2

## 2019-04-25 NOTE — Telephone Encounter (Signed)
RX sent

## 2019-04-25 NOTE — Discharge Instructions (Signed)
Continue taking her Pulmicort twice daily.  For the next 2 days please use your albuterol inhaler every 4-6 hours then as needed.  Return for additional questions or concerns or if at any point you feel your symptoms are not improving.

## 2019-04-25 NOTE — ED Triage Notes (Addendum)
Patient reports worsening SOB x1 week. Reports hx of COPD. States she normally wears 4L of O2. Arrives with no oxygen in place. 72% on RA. 4 L  O2 placed on oxygen with increase to 92%.  Patient denies any cough or fever.

## 2019-04-25 NOTE — ED Provider Notes (Signed)
[facility] Emergency Department Provider Note    First MD Initiated Contact with Patient 04/25/19 1405     (approximate)  I have reviewed the triage vital signs and the nursing notes.   HISTORY  Chief Complaint Shortness of Breath    HPI Emily [name] is a 59 y.o. female close past medical history on 4 L nasal cannula chronically at home presents to the ER for 1 week of worsening shortness of breath.  Does feel she is having some swelling.  Was just recently started on Lasix.  Has not been weighing herself at home.  Not having any productive cough.  States that the shortness of breath is primarily exertional.  She not having any chest pain.  Is not been on recent antibiotics.  She arrives to the ER without oxygen and is satting in the 80s on room air.  On review of medical records it looks like her weight beginning of the month was 103 kg.  She is currently 103 kg today.    Past Medical History:  Diagnosis Date  . Arthritis    hands, all over  . Asthma   . CHF (congestive heart failure) ([place])   . COPD (chronic obstructive pulmonary disease) (H[facility])    on 2L home o2  . Diabetes mellitus without complication ([place])    type 2  . Dyspnea   . Hyperlipidemia   . Hypertension   . Sleep apnea    CPAP  . Stroke [facility])    Was told at recent hospital visit she has stroke in past  . Thyroid disease   . Thyroid nodule 04/30/2016   Family History  Problem Relation Age of Onset  . Diabetes Mother   . Hypertension Mother   . Diabetes Father   . Hypertension Father   . CAD Father   . Diabetes Sister    Past Surgical History:  Procedure Laterality Date  . arm surgery Left   . COLONOSCOPY WITH PROPOFOL N/A 02/14/2019   Procedure: COLONOSCOPY WITH PROPOFOL;  Surgeon: [name], MD;  Location: [facility] ENDOSCOPY;  Service: Endoscopy;  Laterality: N/A;   Patient Active Problem List   Diagnosis Date Noted  . Pain due to onychomycosis of toenails of both feet  03/09/2019  . Type 2 diabetes mellitus with vascular disease ([place]) 03/09/2019  . Chronic venous insufficiency 02/12/2019  . Hyperlipidemia 02/12/2019  . COPD (chronic obstructive pulmonary disease) ([place]) 02/21/2018  . Sleep apnea 02/21/2018  . CHF (congestive heart failure) ([place]) 02/12/2018  . Puerperal sepsis with acute hypoxic respiratory failure ([place]) 11/12/2017  . Leg pain, bilateral 12/17/2016  . Thyroid nodule 04/30/2016  . Left sided numbness 04/27/2016  . Muscle spasm of back 11/05/2015  . Hyperthyroidism 03/13/2015  . Goiter diffuse 03/13/2015  . Poorly controlled type 2 diabetes mellitus with circulatory disorder ([place]) 02/07/2015  . Essential hypertension 12/27/2014  . Chronic kidney disease 12/27/2014  . Tobacco abuse 12/27/2014      Prior to Admission medications   Medication Sig Start Date End Date Taking? Authorizing Provider  Acetylcysteine (NAC) 600 MG CAPS Take by mouth daily.    [provider]  albuterol (PROVENTIL HFA;VENTOLIN HFA) 108 (90 Base) MCG/ACT inhaler Inhale 1-2 puffs into the lungs every 6 (six) hours as needed for wheezing or shortness of breath. 07/16/16   Doles-Johnson, Teah, NP  albuterol (PROVENTIL) (2.5 MG/3ML) 0.083% nebulizer solution Take 3 mLs (2.5 mg total) by nebulization every 6 (six) hours as needed for wheezing or shortness  of breath. 11/18/17   Loletha Grayer, MD  amLODipine (NORVASC) 10 MG tablet Take 10 mg by mouth daily. am    [provider]  aspirin EC 81 MG tablet Take 81 mg by mouth daily.    [provider]  atorvastatin (LIPITOR) 40 MG tablet TAKE ONE TABLET BY MOUTH EVERY DAY Patient taking differently: Take 40 mg by mouth at bedtime.  04/21/16   Tawni Millers, MD  budesonide-formoterol (SYMBICORT) 80-4.5 MCG/ACT inhaler Inhale 2 puffs into the lungs 2 (two) times daily. 11/18/17   Loletha Grayer, MD  carvedilol (COREG) 25 MG tablet Take 1 tablet (25 mg total) by mouth 2 (two) times daily. Patient  taking differently: Take 25 mg by mouth 2 (two) times daily. Am and pm 05/11/18   Darylene Price A, FNP  cyclobenzaprine (FLEXERIL) 5 MG tablet TAKE ONE TABLET BY MOUTH EVERY DAY AS NEEDED FOR MUSCLE SPASMS Patient taking differently: Take 5 mg by mouth daily as needed for muscle spasms.  04/20/16   Doles-Johnson, Teah, NP  esomeprazole (NEXIUM) 40 MG capsule Take 40 mg by mouth daily. am    [provider]  Exenatide ER (BYDUREON BCISE) 2 MG/0.85ML AUIJ Inject 2 mg into the skin once a week. 04/25/19   Philemon Kingdom, MD  furosemide (LASIX) 20 MG tablet Take 1 tablet (20 mg total) by mouth 3 (three) times a week. Take on Monday, Wed and Friday 04/07/19 05/13/19  Alisa Graff, FNP  Insulin Glargine (LANTUS SOLOSTAR) 100 UNIT/ML Solostar Pen INJECT 50 UNITS in Am and 30 at night SUBCUTANEOUSLY TWICE DAILY 03/02/19   Philemon Kingdom, MD  ipratropium (ATROVENT) 0.06 % nasal spray Place 2 sprays into both nostrils 4 (four) times daily.    [provider]  methimazole (TAPAZOLE) 5 MG tablet Take 1 tablet (5 mg total) by mouth daily. Patient taking differently: Take 5 mg by mouth daily. am 12/27/18   Philemon Kingdom, MD  NOVOLOG FLEXPEN 100 UNIT/ML FlexPen 6 units three times/ day 04/17/19   Philemon Kingdom, MD  OZEMPIC, 0.25 OR 0.5 MG/DOSE, 2 MG/1.5ML SOPN Inject 0.5 mg into the skin once a week. 04/19/19   Philemon Kingdom, MD  potassium chloride (KLOR-CON) 10 MEQ tablet Take 10 mEq by mouth 3 (three) times a week.     [provider]  predniSONE (DELTASONE) 20 MG tablet Take 2 tablets (40 mg total) by mouth daily for 5 days. 04/25/19 04/30/19  Merlyn Lot, MD  tiotropium (SPIRIVA) 18 MCG inhalation capsule Place 18 mcg into inhaler and inhale daily.    [provider]    Allergies Patient has no known allergies.    Social History Social History   Tobacco Use  . Smoking status: Former Smoker    Packs/day: 1.50    Years: 40.00    Pack years: 60.00     Types: Cigarettes    Quit date: 01/13/2016    Years since quitting: 3.2  . Smokeless tobacco: Never Used  Substance Use Topics  . Alcohol use: No  . Drug use: No    Review of Systems Patient denies headaches, rhinorrhea, blurry vision, numbness, shortness of breath, chest pain, edema, cough, abdominal pain, nausea, vomiting, diarrhea, dysuria, fevers, rashes or hallucinations unless otherwise stated above in HPI. ____________________________________________   PHYSICAL EXAM:  VITAL SIGNS: Vitals:   04/25/19 1131 04/25/19 1137  BP: (!) 141/74   Pulse: 70   Resp: (!) 22   Temp: 98.4 F (36.9 C)   SpO2: (!) 74%  94%    Constitutional: Alert and oriented.  Eyes: Conjunctivae are normal.  Head: Atraumatic. Nose: No congestion/rhinnorhea. Mouth/Throat: Mucous membranes are moist.   Neck: No stridor. Painless ROM.  Cardiovascular: Normal rate, regular rhythm. Grossly normal heart sounds.  Good peripheral circulation. Respiratory: Normal respiratory effort.  No retractions. With diminished breathsounds throughout, end expiratory wheeze Gastrointestinal: Soft and nontender. No distention. No abdominal bruits. No CVA tenderness. Genitourinary:  Musculoskeletal: No lower extremity tenderness nor edema.  No joint effusions. Neurologic:  Normal speech and language. No gross focal neurologic deficits are appreciated. No facial droop Skin:  Skin is warm, dry and intact. No rash noted. Psychiatric: Mood and affect are normal. Speech and behavior are normal.  ____________________________________________   LABS (all labs ordered are listed, but only abnormal results are displayed)  Results for orders placed or performed during the hospital encounter of 04/25/19 (from the past 24 hour(s))  Basic metabolic panel     Status: Abnormal   Collection Time: 04/25/19 11:38 AM  Result Value Ref Range   Sodium 144 135 - 145 mmol/L   Potassium 4.9 3.5 - 5.1 mmol/L   Chloride 110 98 - 111 mmol/L    CO2 28 22 - 32 mmol/L   Glucose, Bld 92 70 - 99 mg/dL   BUN 30 (H) 6 - 20 mg/dL   Creatinine, Ser 2.65 (H) 0.44 - 1.00 mg/dL   Calcium 8.5 (L) 8.9 - 10.3 mg/dL   GFR calc non Af Amer 19 (L) >60 mL/min   GFR calc Af Amer 22 (L) >60 mL/min   Anion gap 6 5 - 15  CBC     Status: Abnormal   Collection Time: 04/25/19 11:38 AM  Result Value Ref Range   WBC 7.0 4.0 - 10.5 K/uL   RBC 4.32 3.87 - 5.11 MIL/uL   Hemoglobin 11.8 (L) 12.0 - 15.0 g/dL   HCT 39.8 36.0 - 46.0 %   MCV 92.1 80.0 - 100.0 fL   MCH 27.3 26.0 - 34.0 pg   MCHC 29.6 (L) 30.0 - 36.0 g/dL   RDW 13.5 11.5 - 15.5 %   Platelets 204 150 - 400 K/uL   nRBC 0.0 0.0 - 0.2 %  Troponin I (High Sensitivity)     Status: Abnormal   Collection Time: 04/25/19 11:38 AM  Result Value Ref Range   Troponin I (High Sensitivity) 30 (H) <18 ng/L  Brain natriuretic peptide     Status: Abnormal   Collection Time: 04/25/19 11:38 AM  Result Value Ref Range   B Natriuretic Peptide 275.0 (H) 0.0 - 100.0 pg/mL  Troponin I (High Sensitivity)     Status: Abnormal   Collection Time: 04/25/19  4:49 PM  Result Value Ref Range   Troponin I (High Sensitivity) 31 (H) <18 ng/L   ____________________________________________  EKG My review and personal interpretation at Time: 11:33   Indication: sob  Rate: 70  Rhythm: sinus Axis: normal Other: normal intervals, nonspecific st abn,  ____________________________________________  RADIOLOGY  I personally reviewed all radiographic images ordered to evaluate for the above acute complaints and reviewed radiology reports and findings.  These findings were personally discussed with the patient.  Please see medical record for radiology report.  ____________________________________________   PROCEDURES  Procedure(s) performed:  Procedures    Critical Care performed: no ____________________________________________   INITIAL IMPRESSION / ASSESSMENT AND PLAN / ED COURSE  Pertinent labs & imaging  results that were available during my care of the patient were reviewed by  me and considered in my medical decision making (see chart for details).   DDX: Asthma, copd, CHF, pna, ptx, malignancy, Pe, anemia   Emily [name] is a 59 y.o. who presents to the ED with sx as described as above.  Patient is AFVSS in ED. Exam as above. Given current presentation have considered the above differential.   Hypoxic on RA but satting well on her home O2.  Denies any pain.  Somewhat difficult to differentiate chf vs copd.  Doubt acs or pe.  Will give nebs steroids as well as a dose of lasix and reassess.   Clinical Course as of Apr 25 1739  Tue Apr 25, 2019  1548 Patient feels significantly improved after nebulizer and steroid.  I think this is more likely COPD exacerbation.  We will continue to observe.   [PR]  2878 Patient appears improved.  Still awaiting repeat troponin.   [PR]  1736 Patient reassessed.  Feels significantly improved and at her baseline.  She does not have any persistent shortness of breath.  Her O2 is satting in the mid to high 90s on her home O2.  Trop is stable.  She is ambulating with a steady gait.  She does have close follow-up.  Will treat for COPD exacerbation.   [PR]    Clinical Course User Index [PR] Merlyn Lot, MD    The patient was evaluated in Emergency Department today for the symptoms described in the history of present illness. He/she was evaluated in the context of the global COVID-19 pandemic, which necessitated consideration that the patient might be at risk for infection with the SARS-CoV-2 virus that causes COVID-19. Institutional protocols and algorithms that pertain to the evaluation of patients at risk for COVID-19 are in a state of rapid change based on information released by regulatory bodies including the CDC and federal and state organizations. These policies and algorithms were followed during the patient's care in the ED.  As part of my medical  decision making, I reviewed the following data within the [place] notes reviewed and incorporated, Labs reviewed, notes from prior ED visits and [place] Controlled Substance Database   ____________________________________________   FINAL CLINICAL IMPRESSION(S) / ED DIAGNOSES  Final diagnoses:  COPD exacerbation ([place])  SOB (shortness of breath)      NEW MEDICATIONS STARTED DURING THIS VISIT:  New Prescriptions   EXENATIDE ER (BYDUREON BCISE) 2 MG/0.85ML AUIJ    Inject 2 mg into the skin once a week.   PREDNISONE (DELTASONE) 20 MG TABLET    Take 2 tablets (40 mg total) by mouth daily for 5 days.     Note:  This document was prepared using Dragon voice recognition software and may include unintentional dictation errors.    Merlyn Lot, MD 04/25/19 [phone]

## 2019-04-25 NOTE — Telephone Encounter (Signed)
OK to send Bydureon BCise 2 mg weekly

## 2019-04-26 ENCOUNTER — Ambulatory Visit: Payer: Medicaid Other | Admitting: Internal Medicine

## 2019-05-02 NOTE — Progress Notes (Signed)
Patient ID: Grisel Blumenstock, female    DOB: 05-02-1960, 59 y.o.   MRN: [id number]  HPI  Ms Mendia is a 59 y/o female with a history of asthma, DM, hyperlipidemia, HTN, stroke, thyroid disease, COPD, sleep apnea, former tobacco use and chronic heart failure.   Echo report from 02/13/2018 reviewed and showed an EF of 60-65% without LVH.    Was in the ED 04/25/19 due to COPD exacerbation. Given nebulizer and steroids and symptoms improved and patient was released.    She presents today for a follow-up visit with a chief complaint of moderate fatigue upon minimal exertion. She describes this as chronic in nature having been present for several years. She has associated pedal edema (although improving) along with this. She denies any difficulty sleeping, dizziness, abdominal distention, palpitations, chest pain, shortness of breath, cough or weight gain.   She says that she now has portable oxygen tanks but still arrived without her oxygen on. She was initially sleepy with room air sat of 88% but quickly rose to 98% when placed on 4L. Furosemide has been stopped and metolazone has been started.   Past Medical History:  Diagnosis Date  . Arthritis    hands, all over  . Asthma   . CHF (congestive heart failure) ([place])   . COPD (chronic obstructive pulmonary disease) (H[facility])    on 2L home o2  . Diabetes mellitus without complication ([place])    type 2  . Dyspnea   . Hyperlipidemia   . Hypertension   . Sleep apnea    CPAP  . Stroke [facility])    Was told at recent hospital visit she has stroke in past  . Thyroid disease   . Thyroid nodule 04/30/2016   Past Surgical History:  Procedure Laterality Date  . arm surgery Left   . COLONOSCOPY WITH PROPOFOL N/A 02/14/2019   Procedure: COLONOSCOPY WITH PROPOFOL;  Surgeon: [name], MD;  Location: [facility] ENDOSCOPY;  Service: Endoscopy;  Laterality: N/A;   Family History  Problem Relation Age of Onset  . Diabetes Mother   . Hypertension Mother   .  Diabetes Father   . Hypertension Father   . CAD Father   . Diabetes Sister    Social History   Tobacco Use  . Smoking status: Former Smoker    Packs/day: 1.50    Years: 40.00    Pack years: 60.00    Types: Cigarettes    Quit date: 01/13/2016    Years since quitting: 3.3  . Smokeless tobacco: Never Used  Substance Use Topics  . Alcohol use: No   No Known Allergies  Prior to Admission medications   Medication Sig Start Date End Date Taking? Authorizing Provider  Acetylcysteine (NAC) 600 MG CAPS Take by mouth daily.   Yes [provider]  albuterol (PROVENTIL HFA;VENTOLIN HFA) 108 (90 Base) MCG/ACT inhaler Inhale 1-2 puffs into the lungs every 6 (six) hours as needed for wheezing or shortness of breath. 07/16/16  Yes Doles-Johnson, Teah, NP  albuterol (PROVENTIL) (2.5 MG/3ML) 0.083% nebulizer solution Take 3 mLs (2.5 mg total) by nebulization every 6 (six) hours as needed for wheezing or shortness of breath. 11/18/17  Yes Wieting, Richard, MD  amLODipine (NORVASC) 10 MG tablet Take 10 mg by mouth daily. am   Yes [provider]  aspirin EC 81 MG tablet Take 81 mg by mouth daily.   Yes [provider]  atorvastatin (LIPITOR) 40 MG tablet TAKE ONE TABLET BY MOUTH EVERY DAY Patient  taking differently: Take 40 mg by mouth at bedtime.  04/21/16  Yes Tawni Millers, MD  budesonide-formoterol (SYMBICORT) 80-4.5 MCG/ACT inhaler Inhale 2 puffs into the lungs 2 (two) times daily. 11/18/17  Yes Wieting, Richard, MD  carvedilol (COREG) 25 MG tablet Take 1 tablet (25 mg total) by mouth 2 (two) times daily. Patient taking differently: Take 25 mg by mouth 2 (two) times daily. Am and pm 05/11/18  Yes Tresten Pantoja A, FNP  esomeprazole (NEXIUM) 40 MG capsule Take 40 mg by mouth daily. am   Yes [provider]  Insulin Glargine (LANTUS SOLOSTAR) 100 UNIT/ML Solostar Pen INJECT 50 UNITS in Am and 30 at night SUBCUTANEOUSLY TWICE DAILY 03/02/19  Yes Philemon Kingdom, MD   ipratropium (ATROVENT) 0.06 % nasal spray Place 2 sprays into both nostrils 4 (four) times daily.   Yes [provider]  methimazole (TAPAZOLE) 5 MG tablet Take 1 tablet (5 mg total) by mouth daily. Patient taking differently: Take 5 mg by mouth daily. am 12/27/18  Yes Philemon Kingdom, MD  metolazone (ZAROXOLYN) 5 MG tablet Take 5 mg by mouth daily.   Yes [provider]  NOVOLOG FLEXPEN 100 UNIT/ML FlexPen 6 units three times/ day 04/17/19  Yes Philemon Kingdom, MD  potassium chloride (KLOR-CON) 10 MEQ tablet Take 10 mEq by mouth 3 (three) times a week.    Yes [provider]  tiotropium (SPIRIVA) 18 MCG inhalation capsule Place 18 mcg into inhaler and inhale daily.   Yes [provider]  cyclobenzaprine (FLEXERIL) 5 MG tablet TAKE ONE TABLET BY MOUTH EVERY DAY AS NEEDED FOR MUSCLE SPASMS Patient taking differently: Take 5 mg by mouth daily as needed for muscle spasms.  04/20/16   Doles-Johnson, Teah, NP  OZEMPIC, 0.25 OR 0.5 MG/DOSE, 2 MG/1.5ML SOPN Inject 0.5 mg into the skin once a week. Patient not taking: Reported on 05/03/2019 04/19/19   Philemon Kingdom, MD    Review of Systems  Constitutional: Positive for fatigue (easily). Negative for appetite change.  HENT: Negative for congestion, postnasal drip and sore throat.   Eyes: Positive for visual disturbance ("blurry vision").  Respiratory: Negative for cough, shortness of breath and wheezing.   Cardiovascular: Positive for leg swelling. Negative for chest pain and palpitations.  Gastrointestinal: Negative for abdominal distention and abdominal pain.  Endocrine: Negative.   Genitourinary: Negative.   Musculoskeletal: Negative for back pain and neck pain.  Skin: Negative.   Allergic/Immunologic: Negative.   Neurological: Negative for dizziness and light-headedness.  Hematological: Negative for adenopathy. Does not bruise/bleed easily.  Psychiatric/Behavioral: Negative for dysphoric mood and sleep  disturbance (sleeping on 2-3 pillows). The patient is not nervous/anxious.    Vitals:   05/03/19 0839 05/03/19 0852  BP: (!) 142/81   Pulse: 77   Resp: 16   SpO2: (!) 88% 98%  Weight: 221 lb (100.2 kg)   Height: 5\' 4"  (1.626 m)    Wt Readings from Last 3 Encounters:  05/03/19 221 lb (100.2 kg)  04/25/19 227 lb 1.2 oz (103 kg)  04/06/19 228 lb 2 oz (103.5 kg)   Lab Results  Component Value Date   CREATININE 2.65 (H) 04/25/2019   CREATININE 1.87 (H) 04/05/2018   CREATININE 2.46 (H) 02/18/2018     Physical Exam Vitals and nursing note reviewed.  Constitutional:      Appearance: Normal appearance.  HENT:     Head: Normocephalic and atraumatic.  Cardiovascular:     Rate and Rhythm: Normal rate and regular rhythm.  Pulmonary:     Effort: Pulmonary effort is normal. No respiratory distress.     Breath sounds: No wheezing or rales.  Abdominal:     General: There is no distension.     Palpations: Abdomen is soft.  Musculoskeletal:        General: No tenderness.     Cervical back: Normal range of motion and neck supple.     Right lower leg: Edema (trace pitting) present.     Left lower leg: Edema (trace pitting) present.  Skin:    General: Skin is warm and dry.  Neurological:     General: No focal deficit present.     Mental Status: She is alert and oriented to person, place, and time.  Psychiatric:        Mood and Affect: Mood normal.        Behavior: Behavior normal.        Thought Content: Thought content normal.     Assessment & Plan:  1: Chronic heart failure with preserved ejection fraction- - NYHA class III - euvolemic today - hasn't been weighing consistently at home; encouraged her to resume weighing daily and to call for an overnight weight gain of >2 pounds or a weekly weight gain of >5 pounds - weight down 7 pounds from last visit here 1 month ago - not adding salt and has been using Mrs. Dash for seasoning. - wearing compression socks daily with  removal at bedtime with improvement in edema - furosemide has been stopped and metolazone has been started by pulmonology - BNP 02/12/2018 was 663.0  2: HTN- - BP mildly elevated today - saw PCP Patrici Ranks) 12/26/2018 - BMP from 04/25/2019 reviewed and showed sodium 144, potassium 4.9, creatinine 2.65 and GFR 22  3: COPD- - saw pulmonology Lanney Gins) 04/20/19 - supposed to be wearing oxygen at 4L around the clock although did not arrive with it on - room air pulse ox was 88% & quickly rose to 98% once placed on oxygen - explained the importance of wearing her oxygen as directed to lessen the stress on her heart and kidneys  4: Obstructive sleep apnea- - has CPAP but it's unclear if she's wearing it consistently or not - encouraged her to wear it every time she is asleep  5: DM with CKD- - fasting glucose at home today was 98  - A1c 12/26/2018 was 7.2% - saw endocrinology Cruzita Lederer) 12/30/17 - saw nephrology Smith Mince) 03/30/19 & thinks she has an upcoming appointment   Medication bottles were reviewed.   Return in 3 months or sooner for any questions/problems before then.

## 2019-05-03 ENCOUNTER — Other Ambulatory Visit: Payer: Self-pay

## 2019-05-03 ENCOUNTER — Encounter: Payer: Self-pay | Admitting: Family

## 2019-05-03 ENCOUNTER — Ambulatory Visit: Payer: Medicaid Other | Attending: Family | Admitting: Family

## 2019-05-03 VITALS — BP 142/81 | HR 77 | Resp 16 | Ht 64.0 in | Wt 221.0 lb

## 2019-05-03 DIAGNOSIS — J449 Chronic obstructive pulmonary disease, unspecified: Secondary | ICD-10-CM | POA: Diagnosis not present

## 2019-05-03 DIAGNOSIS — E785 Hyperlipidemia, unspecified: Secondary | ICD-10-CM | POA: Insufficient documentation

## 2019-05-03 DIAGNOSIS — I5032 Chronic diastolic (congestive) heart failure: Secondary | ICD-10-CM | POA: Diagnosis not present

## 2019-05-03 DIAGNOSIS — M1990 Unspecified osteoarthritis, unspecified site: Secondary | ICD-10-CM | POA: Insufficient documentation

## 2019-05-03 DIAGNOSIS — G4733 Obstructive sleep apnea (adult) (pediatric): Secondary | ICD-10-CM | POA: Insufficient documentation

## 2019-05-03 DIAGNOSIS — I10 Essential (primary) hypertension: Secondary | ICD-10-CM

## 2019-05-03 DIAGNOSIS — E079 Disorder of thyroid, unspecified: Secondary | ICD-10-CM | POA: Diagnosis not present

## 2019-05-03 DIAGNOSIS — Z7951 Long term (current) use of inhaled steroids: Secondary | ICD-10-CM | POA: Diagnosis not present

## 2019-05-03 DIAGNOSIS — Z7982 Long term (current) use of aspirin: Secondary | ICD-10-CM | POA: Diagnosis not present

## 2019-05-03 DIAGNOSIS — Z87891 Personal history of nicotine dependence: Secondary | ICD-10-CM | POA: Insufficient documentation

## 2019-05-03 DIAGNOSIS — E1122 Type 2 diabetes mellitus with diabetic chronic kidney disease: Secondary | ICD-10-CM | POA: Diagnosis not present

## 2019-05-03 DIAGNOSIS — N189 Chronic kidney disease, unspecified: Secondary | ICD-10-CM | POA: Diagnosis not present

## 2019-05-03 DIAGNOSIS — Z79899 Other long term (current) drug therapy: Secondary | ICD-10-CM | POA: Diagnosis not present

## 2019-05-03 DIAGNOSIS — Z8673 Personal history of transient ischemic attack (TIA), and cerebral infarction without residual deficits: Secondary | ICD-10-CM | POA: Diagnosis not present

## 2019-05-03 DIAGNOSIS — I130 Hypertensive heart and chronic kidney disease with heart failure and stage 1 through stage 4 chronic kidney disease, or unspecified chronic kidney disease: Secondary | ICD-10-CM | POA: Diagnosis present

## 2019-05-03 DIAGNOSIS — N184 Chronic kidney disease, stage 4 (severe): Secondary | ICD-10-CM

## 2019-05-03 NOTE — Patient Instructions (Signed)
Continue weighing daily and call for an overnight weight gain of > 2 pounds or a weekly weight gain of >5 pounds. 

## 2019-05-05 ENCOUNTER — Other Ambulatory Visit: Payer: Self-pay | Admitting: Family

## 2019-05-17 ENCOUNTER — Telehealth: Payer: Self-pay

## 2019-05-17 NOTE — Telephone Encounter (Signed)
That is not fair - as she has been on Ozempic (and prev. Trulicity), but we can add Victoza 1.8 mg daily in am, before b'fast, and she how she does with it. If she does not do well, we can re-try to send Ozempic.

## 2019-05-17 NOTE — Telephone Encounter (Signed)
Before Medicaid will approved Ozempic patient must try and fail both preferred medications which are now Victoza and Byetta. Bydureon is not longer preferred.  Please advise.

## 2019-05-17 NOTE — Telephone Encounter (Signed)
PA has been faxed to [facility].   I will call them and follow up on Friday.

## 2019-05-23 ENCOUNTER — Other Ambulatory Visit: Payer: Self-pay | Admitting: Internal Medicine

## 2019-05-26 NOTE — Telephone Encounter (Signed)
Per [place] [facility] PA approved.

## 2019-06-09 ENCOUNTER — Other Ambulatory Visit: Payer: Self-pay | Admitting: Internal Medicine

## 2019-06-12 ENCOUNTER — Other Ambulatory Visit: Payer: Self-pay

## 2019-06-12 ENCOUNTER — Encounter: Payer: Self-pay | Admitting: Podiatry

## 2019-06-12 ENCOUNTER — Ambulatory Visit: Payer: Medicaid Other | Admitting: Podiatry

## 2019-06-12 VITALS — Temp 97.7°F

## 2019-06-12 DIAGNOSIS — M79675 Pain in left toe(s): Secondary | ICD-10-CM

## 2019-06-12 DIAGNOSIS — M79674 Pain in right toe(s): Secondary | ICD-10-CM | POA: Diagnosis not present

## 2019-06-12 DIAGNOSIS — B351 Tinea unguium: Secondary | ICD-10-CM

## 2019-06-12 DIAGNOSIS — I872 Venous insufficiency (chronic) (peripheral): Secondary | ICD-10-CM

## 2019-06-12 DIAGNOSIS — E1159 Type 2 diabetes mellitus with other circulatory complications: Secondary | ICD-10-CM

## 2019-06-12 DIAGNOSIS — N189 Chronic kidney disease, unspecified: Secondary | ICD-10-CM

## 2019-06-12 NOTE — Progress Notes (Signed)
Complaint:  Visit Type: Patient returns to my office for continued preventative foot care services. Complaint: Patient states" my nails have grown long and thick and become painful to walk and wear shoes" Patient has been diagnosed with DM with vascular disease and chronic venous insufficiency and chronic kidney disease.. The patient presents for preventative foot care services.  Podiatric Exam: Vascular: dorsalis pedis and posterior tibial pulses are weakly  palpable bilateral. Capillary return is immediate. Temperature gradient is WNL. Skin turgor WNL  Sensorium: Normal Semmes Weinstein monofilament test. Normal tactile sensation bilaterally. Nail Exam: Pt has thick disfigured discolored nails with subungual debris noted bilateral entire nail hallux through fifth toenails Ulcer Exam: There is no evidence of ulcer or pre-ulcerative changes or infection. Orthopedic Exam: Muscle tone and strength are WNL. No limitations in general ROM. No crepitus or effusions noted. Foot type and digits show no abnormalities. Bony prominences are unremarkable. Skin: No Porokeratosis. No infection or ulcers  Diagnosis:  Onychomycosis, , Pain in right toe, pain in left toes  Treatment & Plan Procedures and Treatment: Consent by patient was obtained for treatment procedures.   Debridement of mycotic and hypertrophic toenails, 1 through 5 bilateral and clearing of subungual debris. No ulceration, no infection noted.  Return Visit-Office Procedure: Patient instructed to return to the office for a follow up visit 3 months for continued evaluation and treatment.    Gardiner Barefoot DPM

## 2019-06-13 ENCOUNTER — Encounter: Payer: Self-pay | Admitting: Internal Medicine

## 2019-06-13 ENCOUNTER — Ambulatory Visit (INDEPENDENT_AMBULATORY_CARE_PROVIDER_SITE_OTHER): Payer: Medicaid Other | Admitting: Internal Medicine

## 2019-06-13 VITALS — BP 122/70 | HR 84 | Ht 64.0 in | Wt 224.0 lb

## 2019-06-13 DIAGNOSIS — E0590 Thyrotoxicosis, unspecified without thyrotoxic crisis or storm: Secondary | ICD-10-CM | POA: Diagnosis not present

## 2019-06-13 DIAGNOSIS — E040 Nontoxic diffuse goiter: Secondary | ICD-10-CM

## 2019-06-13 DIAGNOSIS — E049 Nontoxic goiter, unspecified: Secondary | ICD-10-CM | POA: Diagnosis not present

## 2019-06-13 DIAGNOSIS — E1165 Type 2 diabetes mellitus with hyperglycemia: Secondary | ICD-10-CM

## 2019-06-13 DIAGNOSIS — E1159 Type 2 diabetes mellitus with other circulatory complications: Secondary | ICD-10-CM

## 2019-06-13 LAB — POCT GLYCOSYLATED HEMOGLOBIN (HGB A1C): Hemoglobin A1C: 9.3 % — AB (ref 4.0–5.6)

## 2019-06-13 LAB — T4, FREE: Free T4: 0.66 ng/dL (ref 0.60–1.60)

## 2019-06-13 LAB — TSH: TSH: 2.99 u[IU]/mL (ref 0.35–4.50)

## 2019-06-13 LAB — T3, FREE: T3, Free: 3.3 pg/mL (ref 2.3–4.2)

## 2019-06-13 MED ORDER — NOVOLOG FLEXPEN 100 UNIT/ML ~~LOC~~ SOPN: PEN_INJECTOR | SUBCUTANEOUS | 5 refills | Status: DC

## 2019-06-13 MED ORDER — OZEMPIC (0.25 OR 0.5 MG/DOSE) 2 MG/1.5ML ~~LOC~~ SOPN: 0.5000 mg | PEN_INJECTOR | SUBCUTANEOUS | 2 refills | Status: DC

## 2019-06-13 NOTE — Patient Instructions (Addendum)
Please try to restart: - Ozempic 0.5 mg weekly  Please continue: - Lantus 50 units in a.m. and 30 units at bedtime  Change: - NovoLog 16 units before breakfast and 16-20 units before dinner (take this 15 min before meals)  Change the Lantus and NovoLog pens.  Please return in 1.5 months with your sugar log.

## 2019-06-13 NOTE — Progress Notes (Signed)
Patient ID: Emily [name], female   DOB: 02/28/1960, 59 y.o.   MRN: [id number]  This visit occurred during the SARS-CoV-2 public health emergency.  Safety protocols were in place, including screening questions prior to the visit, additional usage of staff PPE, and extensive cleaning of exam room while observing appropriate contact time as indicated for disinfecting solutions.   HPI  Emily [name] is a 59 y.o.-year-old female, returning for f/u for thyrotoxicosis/goiter and DM2, uncontrolled, insulin-dependent, with complications (CKD stage 3, cerebro-vascular ds. - s/p stroke). Last visit 6 months ago.  TMNG/Graves ds.: Reviewed and addended history: Pt was dx'ed with hyperthyroidism in ~2014 >> started MMI, lately 10 mg 2x daily. She was lost for f/u at [facility] as she could not travel over there.  Thyroid uptake and scan (06/22/2013) showed: FINDINGS: The 24 hour uptake was 24.7%. The normal reference range at 24 hours is 15 to 35%.   The thyroid scan demonstrates symmetric, mildly heterogenous distribution of radiopharmaceutical throughout the thyroid, which is increased in size.  IMPRESSION: Mildly heterogenous iodine uptake, cannot exclude toxic multinodular goiter versus possible mild Graves disease. Consider ultrasound for further correlation.  Patient was on methimazole before, however, this was stopped after  TSH returned very elevated, in the 90s (02/2015) >> TSH decreased to 8, then normalized >> we continued her off MMI.   However,she was started on MMI 10 mg 2x a day (re-Rx by Dr. Rosanna Randy: 04/17/2016, [facility]) - I do not think he was aware that we had stopped MMI.  We stopped methimazole again in 05/2016.  However, we had to restart again methimazole in 05/2017.  She was on 5 mg daily, but this was stopped during the hospitalization for COPD and CHF exacerbation 02/2018.  In 12/2018 we had to restart methimazole.  She is taking 5 mg daily.  Reviewed her TFTs: Lab  Results  Component Value Date   TSH 2.09 03/06/2019   TSH 0.15 (L) 12/26/2018   TSH 0.37 07/29/2018   TSH 0.45 04/05/2018   TSH 0.757 11/14/2017   TSH 1.61 09/27/2017   TSH 0.03 (L) 05/24/2017   TSH 0.54 08/20/2016   TSH 2.840 01/14/2016   TSH 3.20 12/10/2015   FREET4 0.75 03/06/2019   FREET4 0.90 12/26/2018   FREET4 0.81 07/29/2018   FREET4 0.79 04/05/2018   FREET4 0.71 09/27/2017   FREET4 0.92 05/24/2017   FREET4 1.10 08/20/2016   FREET4 0.47 (L) 12/10/2015   FREET4 0.32 (L) 11/07/2015   FREET4 0.62 07/31/2015   Previously:   Thyroid U/S (05/12/2016): Large isthmic nodule Thyroid tissue is diffusely heterogeneous and enlarged.  The isthmus has a large isoechoic nodular structure. Unclear if this represents a discrete nodule versus focal heterogeneous thyroid tissue. This area measures up to 3.3 cm and recommend biopsy of this area.  Numerous small nodules and cysts throughout the left and right thyroid lobes. These predominantly cystic nodules do not meet criteria for biopsy or dedicated follow-up.  FNA of the isthmic nodule (06/25/2016): Benign  Pt denies: - feeling nodules in neck - hoarseness - dysphagia - choking - SOB with lying down  Pt does have a FH of thyroid ds.: aunt. No FH of thyroid cancer. No h/o radiation tx to head or neck.  No herbal supplements. No Biotin use. No recent steroids use.   DM2: He has a history of noncompliance with medications. She had several complications: History of stroke, also CKD, DR  Reviewed HbA1c levels: Lab Results  Component Value Date  HGBA1C 7.2 (A) 12/26/2018   HGBA1C 7.6 (A) 07/29/2018   HGBA1C 6.7 (H) 02/12/2018   She was on: - Lantus 45 units 2x a day >> was off insulin - "the pharmacy did not receive it" - Januvia 50 mg in am >> stopped (?) - Glipizide 10 mg 2x a day >> started in the hospital We added Jardiance at last visit >> not taking  - "the pharmacy did not receive it" She was on Metformin  >> diarrhea.  Now on: - Trulicity 1.5 mg weekly >> Ozempic 0.5 mg weekly >> off since 1 mo ago (apparently not covered -but the PA has been approved...) - Lantus 50 units in a.m. and 30 units at bedtime - NovoLog  6 units TID (changed by "the other dr"-?) >> 6 units in am and 12 units after dinner We had to start Jardiance 10 mg 02/2018 due to decreased kidney function.  She is checking sugars ~2 a day: - am:  112-234, 269 >>> 91-152, 165, 183 >> 130-333 - 2h after b'fast: n/c - lunch: n/c - 2h after lunch: n/c >> 178 - dinner: n/c >> 80-305, 390 >> n/c >> 141 >> n/c >> 148, 282 - 2h after dinner: n/c >> 95-192 >> 185-269 >> 119, 122 >> n/c - bedtime: 80, 112-211, 239, HI >> 159-281 >> 97-102 >>  227-453 - nighttime: 167-295  >> 139-197, 211, 217 >> see above  + CKD; latest BUN/Cr: Lab Results  Component Value Date   BUN 30 (H) 04/25/2019   Lab Results  Component Value Date   CREATININE 2.65 (H) 04/25/2019  GFR 24 (02/18/2018)  + HL.  Latest lipid profile: Lab Results  Component Value Date   CHOL 143 07/29/2018   HDL 42.20 07/29/2018   LDLCALC 70 07/29/2018   TRIG 150.0 (H) 07/29/2018   CHOLHDL 3 07/29/2018  On Lipitor 40.  Latest dilated eye exam was in 01/2018: + DR  ROS: Constitutional: no weight gain/no weight loss, no fatigue, no subjective hyperthermia, no subjective hypothermia Eyes: no blurry vision, no xerophthalmia ENT: no sore throat, + see HPI Cardiovascular: no CP/no SOB/no palpitations/no leg swelling Respiratory: no cough/no SOB/no wheezing Gastrointestinal: no N/no V/no D/no C/no acid reflux Musculoskeletal: + muscle aches/no joint aches Skin: no rashes, no hair loss Neurological: no tremors/no numbness/no tingling/no dizziness  I reviewed pt's medications, allergies, PMH, social hx, family hx, and changes were documented in the history of present illness. Otherwise, unchanged from my initial visit note.  Past Medical History:  Diagnosis Date  .  Arthritis    hands, all over  . Asthma   . CHF (congestive heart failure) ([place])   . COPD (chronic obstructive pulmonary disease) ([facility])    on 2L home o2  . Diabetes mellitus without complication ([place])    type 2  . Dyspnea   . Hyperlipidemia   . Hypertension   . Sleep apnea    CPAP  . Stroke [facility])    Was told at recent hospital visit she has stroke in past  . Thyroid disease   . Thyroid nodule 04/30/2016   Past Surgical History:  Procedure Laterality Date  . arm surgery Left   . COLONOSCOPY WITH PROPOFOL N/A 02/14/2019   Procedure: COLONOSCOPY WITH PROPOFOL;  Surgeon: [name], MD;  Location: [facility] ENDOSCOPY;  Service: Endoscopy;  Laterality: N/A;   Social History   Social History  . Marital Status: Single    Spouse Name: N/A  . Number of Children: 1  Occupational History  . None   Social History Main Topics  . Smoking status: Current Every Day Smoker -- 1.00 packs/day  . Smokeless tobacco: Not on file  . Alcohol Use: No  . Drug Use: No   Current Outpatient Medications on File Prior to Visit  Medication Sig Dispense Refill  . Acetylcysteine (NAC) 600 MG CAPS Take by mouth daily.    Marland Kitchen albuterol (PROVENTIL HFA;VENTOLIN HFA) 108 (90 Base) MCG/ACT inhaler Inhale 1-2 puffs into the lungs every 6 (six) hours as needed for wheezing or shortness of breath. 1 Inhaler 11  . albuterol (PROVENTIL) (2.5 MG/3ML) 0.083% nebulizer solution Take 3 mLs (2.5 mg total) by nebulization every 6 (six) hours as needed for wheezing or shortness of breath. 100 vial 0  . amLODipine (NORVASC) 10 MG tablet Take 10 mg by mouth daily. am    . aspirin EC 81 MG tablet Take 81 mg by mouth daily.    Marland Kitchen atorvastatin (LIPITOR) 40 MG tablet TAKE ONE TABLET BY MOUTH EVERY DAY (Patient taking differently: Take 40 mg by mouth at bedtime. ) 90 tablet PRN  . budesonide-formoterol (SYMBICORT) 80-4.5 MCG/ACT inhaler Inhale 2 puffs into the lungs 2 (two) times daily. 1 Inhaler 12  . carvedilol (COREG) 25 MG tablet  Take 1 tablet by mouth twice daily 180 tablet 3  . cyclobenzaprine (FLEXERIL) 5 MG tablet TAKE ONE TABLET BY MOUTH EVERY DAY AS NEEDED FOR MUSCLE SPASMS (Patient taking differently: Take 5 mg by mouth daily as needed for muscle spasms. ) 30 tablet 0  . esomeprazole (NEXIUM) 40 MG capsule Take 40 mg by mouth daily. am    . Insulin Glargine (LANTUS SOLOSTAR) 100 UNIT/ML Solostar Pen INJECT 50 UNITS in Am and 30 at night SUBCUTANEOUSLY TWICE DAILY 72 mL 1  . ipratropium (ATROVENT) 0.06 % nasal spray Place 2 sprays into both nostrils 4 (four) times daily.    . methimazole (TAPAZOLE) 5 MG tablet Take 1 tablet (5 mg total) by mouth daily. (Patient taking differently: Take 5 mg by mouth daily. am) 90 tablet 3  . metolazone (ZAROXOLYN) 5 MG tablet Take 5 mg by mouth daily.    Marland Kitchen NOVOLOG FLEXPEN 100 UNIT/ML FlexPen INJECT 6 UNITS SUBCUTANEOUSLY THREE TIMES DAILY 15 mL 1  . OZEMPIC, 0.25 OR 0.5 MG/DOSE, 2 MG/1.5ML SOPN Inject 0.5 mg into the skin once a week. 3 pen 2  . potassium chloride (KLOR-CON) 10 MEQ tablet Take 10 mEq by mouth 3 (three) times a week.     . tiotropium (SPIRIVA) 18 MCG inhalation capsule Place 18 mcg into inhaler and inhale daily.     No current facility-administered medications on file prior to visit.   No Known Allergies Family History  Problem Relation Age of Onset  . Diabetes Mother   . Hypertension Mother   . Diabetes Father   . Hypertension Father   . CAD Father   . Diabetes Sister    PE: BP 122/70   Pulse 84   Ht 5\' 4"  (1.626 m)   Wt 224 lb (101.6 kg)   SpO2 (!) 85%   BMI 38.45 kg/m  Body mass index is 38.45 kg/m. Usually on O2 - now off in the office.  Wt Readings from Last 3 Encounters:  05/03/19 221 lb (100.2 kg)  04/25/19 227 lb 1.2 oz (103 kg)  04/06/19 228 lb 2 oz (103.5 kg)   Constitutional: overweight, in NAD Eyes: PERRLA, EOMI, no exophthalmos ENT: moist mucous membranes, +L>R thyromegaly, no cervical lymphadenopathy  Cardiovascular: RRR, No MRG, +  L>R referral LE edema Respiratory: CTA B Gastrointestinal: abdomen soft, NT, ND, BS+ Musculoskeletal: no deformities, strength intact in all 4 Skin: moist, warm, no rashes Neurological: no tremor with outstretched hands, DTR normal in all 4  ASSESSMENT: 1. Thyrotoxicosis  2. Large goiter US SOFT TISSUE HEAD AND NECK  Order: [id number]  Status:  Final result Visible to patient:  No (Not Released) Dx:  Goiter diffuse  Details   Reading Physician Reading Date Result Priority  Markus Daft, MD 05/12/2016   Narrative    CLINICAL DATA: Thyromegaly on previous chest CT.  EXAM: THYROID ULTRASOUND  TECHNIQUE: Ultrasound examination of the thyroid gland and adjacent soft tissues was performed.  COMPARISON: Chest CT 01/14/2016  FINDINGS: Parenchymal Echotexture: Moderately heterogenous  Isthmus: 1.2 cm in the AP dimension  Right lobe: 8.4 x 2.8 x 4.5 cm  Left lobe: 8.2 x 3.0 x 3.7 cm  _________________________________________________________  Estimated total number of nodules >/= 1 cm: 6-10  Number of spongiform nodules >/= 2 cm not described below (TR1): 0  Number of mixed cystic and solid nodules >/= 1.5 cm not described below ([place]): 0  _________________________________________________________  Nodule # 1:  Location: Isthmus; Inferior  Maximum size: 3.3 cm; Other 2 dimensions: 1.7 x 2.7 cm  Composition: solid/almost completely solid (2)  Echogenicity: isoechoic (1)  **Given size (>/= 2.5 cm) and appearance, fine needle aspiration of this mildly suspicious nodule should be considered based on TI-RADS criteria.  _________________________________________________________  Mildly complex cystic structure in the superior right thyroid lobe measuring up to 1.0 cm. Minimally complex cyst in the superior right thyroid lobe measuring up to 1.6 cm. Adjacent complex cysts or spongiform nodules in the mid right thyroid lobe, largest measuring 1.1 cm. Spongiform nodule  in the mid left thyroid lobe measuring up to 1.3 cm.  Nodule # 2:  Location: Left; Mid  Maximum size: 2.0 cm; Other 2 dimensions: 0.8 x 0.8 cm  Composition: mixed cystic and solid (1)  Echogenicity: cannot determine (1)  This nodule does NOT meet TI-RADS criteria for biopsy or dedicated follow-up.  _________________________________________________________  IMPRESSION: Thyroid tissue is diffusely heterogeneous and enlarged.  The isthmus has a large isoechoic nodular structure. Unclear if this represents a discrete nodule versus focal heterogeneous thyroid tissue. This area measures up to 3.3 cm and recommend biopsy of this area.  Numerous small nodules and cysts throughout the left and right thyroid lobes. These predominantly cystic nodules do not meet criteria for biopsy or dedicated follow-up.  The above is in keeping with the ACR TI-RADS recommendations - J Am Coll Radiol 2017;14:587-595.   Electronically Signed By: Markus Daft M.D. On: 05/12/2016 12:03        Addendum: Adequacy Reason Satisfactory For Evaluation. Diagnosis THYROID, FINE NEEDLE ASPIRATION ISTHMUS INFERIOR (SPECIMEN 1 OF 1, COLLECTED ON 06/25/2016): CONSISTENT WITH BENIGN FOLLICULAR NODULE ([place] CATEGORY II). Casimer Lanius MD Pathologist, Electronic Signature (Case signed 06/26/2016) Specimen Clinical Information Nodule 1 Isthmus Inferior 3.3 cm; Other 2 dimensions: 1.7 x 2.7 cm, Solid / almost completely solid, Isoechoic, ACR TI-RADS total points: 3, Midly suspicious nodule Source Thyroid, Fine Needle Aspiration, Isthmus Inferior, (Specimen 1 of 1, colected on 06/25/16 )  3. DM2, insulin dependent, uncontrolled  PLAN:  1. And 2.  -She has a history of thyrotoxicosis, and, per review of her thyroid uptake and scan, this is either due to mild Graves' disease or mild toxic multinodular goiter. -Her TFTs were previously normal on low-dose methimazole, which  was stopped during her last  hospitalization 02/2018, unclear why but at last visit TSH was slightly low -We restarted methimazole 5 mg daily at that time >>  TFTs were normal 3 months ago -We will recheck her TFTs today and change the methimazole dose accordingly.  2. Large goiter -She has minimal neck compression symptoms, not bothersome -Reviewed the report of her latest thyroid ultrasound and biopsy.  The dominant nodule measures 3.3 cm and had a benign biopsy. -We will continue to monitor her clinically.  I also plan to order another ultrasound at next visit.  3. DM2 - Patient with longstanding, uncontrolled, type 2 diabetes, on a basal-bolus insulin regimen and previously also weekly GLP-1 receptor agonist, Ozempic, stopped approximately 1 month ago due to lack of insurance coverage.  We did send a PA and this was approved.  Therefore, at this visit: I sent another prescription for Ozempic to her pharmacy.  Of note, Trulicity was not covered and Bydureon was not approved in the past. -At this visit, sugars are much higher than before, and I am not sure if this is due to stopping Ozempic, a poor diet, or not taking of NovoLog.  She tells me she was on a lower dose of NovoLog, 6 units with her meals, changed by another doctor but she cannot remember the name.  However, in the last month, she increase the dose of NovoLog at night to 12 units.  Upon questioning, though, she is taking this after dinner, and we discussed that NovoLog needs to be taken 15 minutes before meals.  At this visit, we discussed about increasing the dose of NovoLog to 16 units in the morning and 16-20 units before dinner. -She brings her medications with her including the Lantus and NovoLog pens.  These are quite warm to touch and I suspect that they may be degraded.  I advised her to start new pens.  Refilled her NovoLog prescription.  We will keep the Lantus dose the same. - I advised her to: Patient Instructions  Please try to restart: - Ozempic 0.5  mg weekly  Please continue: - Lantus 50 units in a.m. and 30 units at bedtime  Change: - NovoLog 16 units before breakfast and 16-20 units before dinner (take this 15 min before meals)  Change the Lantus and NovoLog pens.  Please return in 1.5 months with your sugar log.   - we checked her HbA1c: 9.2% (much higher) - advised to check sugars at different times of the day - 3x a day, rotating check times - advised for yearly eye exams >> she is not UTD - return to clinic in1.5 months  CC: PCP: Gennette Pac, [place] - [phone]  Office Visit on 06/13/2019  Component Date Value Ref Range Status  . TSH 06/13/2019 2.99  0.35 - 4.50 uIU/mL Final  . Free T4 06/13/2019 0.66  0.60 - 1.60 ng/dL Final   Comment: Specimens from patients who are undergoing biotin therapy and /or ingesting biotin supplements may contain high levels of biotin.  The higher biotin concentration in these specimens interferes with this Free T4 assay.  Specimens that contain high levels  of biotin may cause false high results for this Free T4 assay.  Please interpret results in light of the total clinical presentation of the patient.    . T3, Free 06/13/2019 3.3  2.3 - 4.2 pg/mL Final  . Hemoglobin A1C 06/13/2019 9.3* 4.0 - 5.6 % Final   Normal TFTs.  We will continue the  current dose of methimazole for now.  Philemon Kingdom, MD PhD [facility] Endocrinology

## 2019-06-14 ENCOUNTER — Telehealth (INDEPENDENT_AMBULATORY_CARE_PROVIDER_SITE_OTHER): Payer: Self-pay | Admitting: Vascular Surgery

## 2019-06-14 NOTE — Telephone Encounter (Signed)
Called stating she's having is pain in her legs more so the left leg but both legs are swollen. She said she has been walking and wearing her compression socks as advised last time she was seen in February (02-27-19) w/ Bil Le Ven Reflux. (GS) . She would like to come in to be seen. Please advise.

## 2019-06-14 NOTE — Telephone Encounter (Signed)
Per Arna Medici she can be brought in to be seen.

## 2019-06-27 ENCOUNTER — Telehealth: Payer: Self-pay | Admitting: Internal Medicine

## 2019-06-27 NOTE — Telephone Encounter (Signed)
Patient called to advise that insurance will not cover her Ozempic.  She would like something else to be call in for her - contact [phone]

## 2019-06-27 NOTE — Telephone Encounter (Signed)
Before Medicaid will approved Ozempic patient must try and fail both preferred medications which are now Victoza and Byetta.

## 2019-06-28 MED ORDER — VICTOZA 18 MG/3ML ~~LOC~~ SOPN: 1.8000 mg | PEN_INJECTOR | Freq: Every day | SUBCUTANEOUS | 3 refills | Status: DC

## 2019-06-28 NOTE — Addendum Note (Signed)
Addended by: Cardell Peach I on: 06/28/2019 03:24 PM   Modules accepted: Orders

## 2019-06-28 NOTE — Telephone Encounter (Signed)
OK to send Victoza 1.8 mg before breakfast daily.  We can send this for 3 months with 3 refills.

## 2019-06-28 NOTE — Telephone Encounter (Signed)
RX sent

## 2019-07-03 ENCOUNTER — Encounter (INDEPENDENT_AMBULATORY_CARE_PROVIDER_SITE_OTHER): Payer: Self-pay | Admitting: Vascular Surgery

## 2019-07-03 ENCOUNTER — Ambulatory Visit (INDEPENDENT_AMBULATORY_CARE_PROVIDER_SITE_OTHER): Payer: Medicaid Other | Admitting: Vascular Surgery

## 2019-07-03 ENCOUNTER — Other Ambulatory Visit: Payer: Self-pay

## 2019-07-03 VITALS — BP 150/76 | HR 94 | Resp 16 | Wt 223.2 lb

## 2019-07-03 DIAGNOSIS — I872 Venous insufficiency (chronic) (peripheral): Secondary | ICD-10-CM

## 2019-07-03 DIAGNOSIS — E782 Mixed hyperlipidemia: Secondary | ICD-10-CM

## 2019-07-03 DIAGNOSIS — E1159 Type 2 diabetes mellitus with other circulatory complications: Secondary | ICD-10-CM | POA: Diagnosis not present

## 2019-07-03 DIAGNOSIS — I890 Lymphedema, not elsewhere classified: Secondary | ICD-10-CM | POA: Diagnosis not present

## 2019-07-03 DIAGNOSIS — I10 Essential (primary) hypertension: Secondary | ICD-10-CM

## 2019-07-03 NOTE — Progress Notes (Signed)
MRN : [id number]  Emily [name] is a 58 y.o. ([birth year]) female who presents with chief complaint of  Chief Complaint  Patient presents with  . Follow-up    left leg pain  .  History of Present Illness:   The patient returns to the office for followup evaluation regarding leg swelling.  The swelling has persisted and the pain associated with swelling continues. There have not been any interval development of a ulcerations or wounds.  Since the previous visit the patient has been wearing graduated compression stockings and has noted little if any improvement in the lymphedema. The patient has been using compression routinely morning until night.  The patient also states elevation during the day and exercise is being done too.   Current Meds  Medication Sig  . Acetylcysteine (NAC) 600 MG CAPS Take by mouth daily.  Marland Kitchen albuterol (PROVENTIL HFA;VENTOLIN HFA) 108 (90 Base) MCG/ACT inhaler Inhale 1-2 puffs into the lungs every 6 (six) hours as needed for wheezing or shortness of breath.  Marland Kitchen albuterol (PROVENTIL) (2.5 MG/3ML) 0.083% nebulizer solution Take 3 mLs (2.5 mg total) by nebulization every 6 (six) hours as needed for wheezing or shortness of breath.  Marland Kitchen amLODipine (NORVASC) 10 MG tablet Take 10 mg by mouth daily. am  . aspirin EC 81 MG tablet Take 81 mg by mouth daily.  Marland Kitchen atorvastatin (LIPITOR) 40 MG tablet TAKE ONE TABLET BY MOUTH EVERY DAY (Patient taking differently: Take 40 mg by mouth at bedtime. )  . budesonide-formoterol (SYMBICORT) 80-4.5 MCG/ACT inhaler Inhale 2 puffs into the lungs 2 (two) times daily.  . carvedilol (COREG) 25 MG tablet Take 1 tablet by mouth twice daily  . cyclobenzaprine (FLEXERIL) 5 MG tablet TAKE ONE TABLET BY MOUTH EVERY DAY AS NEEDED FOR MUSCLE SPASMS (Patient taking differently: Take 5 mg by mouth daily as needed for muscle spasms. )  . ergocalciferol (VITAMIN D2) 1.25 MG (50000 UT) capsule Take by mouth.  . esomeprazole (NEXIUM) 40 MG capsule  Take 40 mg by mouth daily. am  . Insulin Glargine (LANTUS SOLOSTAR) 100 UNIT/ML Solostar Pen INJECT 50 UNITS in Am and 30 at night SUBCUTANEOUSLY TWICE DAILY  . ipratropium (ATROVENT) 0.06 % nasal spray Place 2 sprays into both nostrils 4 (four) times daily.  Marland Kitchen liraglutide (VICTOZA) 18 MG/3ML SOPN Inject 0.3 mLs (1.8 mg total) into the skin daily.  . methimazole (TAPAZOLE) 5 MG tablet Take 1 tablet (5 mg total) by mouth daily. (Patient taking differently: Take 5 mg by mouth daily. am)  . metolazone (ZAROXOLYN) 5 MG tablet Take 5 mg by mouth daily.  Marland Kitchen NOVOLOG FLEXPEN 100 UNIT/ML FlexPen INJECT 16-20 UNITS SUBCUTANEOUSLY 2x TIMES DAILY before meals  . potassium chloride (KLOR-CON) 10 MEQ tablet Take 10 mEq by mouth 3 (three) times a week.   . tiotropium (SPIRIVA) 18 MCG inhalation capsule Place 18 mcg into inhaler and inhale daily.    Past Medical History:  Diagnosis Date  . Arthritis    hands, all over  . Asthma   . CHF (congestive heart failure) ([place])   . COPD (chronic obstructive pulmonary disease) ([facility])    on 2L home o2  . Diabetes mellitus without complication ([place])    type 2  . Dyspnea   . Hyperlipidemia   . Hypertension   . Sleep apnea    CPAP  . Stroke [facility])    Was told at recent hospital visit she has stroke in past  . Thyroid disease   . Thyroid  nodule 04/30/2016    Past Surgical History:  Procedure Laterality Date  . arm surgery Left   . COLONOSCOPY WITH PROPOFOL N/A 02/14/2019   Procedure: COLONOSCOPY WITH PROPOFOL;  Surgeon: [name], MD;  Location: [facility] ENDOSCOPY;  Service: Endoscopy;  Laterality: N/A;    Social History Social History   Tobacco Use  . Smoking status: Former Smoker    Packs/day: 1.50    Years: 40.00    Pack years: 60.00    Types: Cigarettes    Quit date: 01/13/2016    Years since quitting: 3.4  . Smokeless tobacco: Never Used  Vaping Use  . Vaping Use: Never used  Substance Use Topics  . Alcohol use: No  . Drug use: No    Family  History Family History  Problem Relation Age of Onset  . Diabetes Mother   . Hypertension Mother   . Diabetes Father   . Hypertension Father   . CAD Father   . Diabetes Sister     No Known Allergies   REVIEW OF SYSTEMS (Negative unless checked)  Constitutional: [] Weight loss  [] Fever  [] Chills Cardiac: [] Chest pain   [] Chest pressure   [] Palpitations   [] Shortness of breath when laying flat   [] Shortness of breath with exertion. Vascular:  [] Pain in legs with walking   [x] Pain in legs at rest  [] History of DVT   [] Phlebitis   [x] Swelling in legs   [] Varicose veins   [] Non-healing ulcers Pulmonary:   [] Uses home oxygen   [] Productive cough   [] Hemoptysis   [] Wheeze  [] COPD   [] Asthma Neurologic:  [] Dizziness   [] Seizures   [] History of stroke   [] History of TIA  [] Aphasia   [] Vissual changes   [] Weakness or numbness in arm   [] Weakness or numbness in leg Musculoskeletal:   [] Joint swelling   [] Joint pain   [] Low back pain Hematologic:  [] Easy bruising  [] Easy bleeding   [] Hypercoagulable state   [] Anemic Gastrointestinal:  [] Diarrhea   [] Vomiting  [] Gastroesophageal reflux/heartburn   [] Difficulty swallowing. Genitourinary:  [] Chronic kidney disease   [] Difficult urination  [] Frequent urination   [] Blood in urine Skin:  [] Rashes   [] Ulcers  Psychological:  [] History of anxiety   []  History of major depression.  Physical Examination  Vitals:   07/03/19 0913  BP: (!) 150/76  Pulse: 94  Resp: 16  Weight: 223 lb 3.2 oz (101.2 kg)   Body mass index is 38.31 kg/m. Gen: WD/WN, NAD Head: [place]/AT, No temporalis wasting.  Ear/Nose/Throat: Hearing grossly intact, nares w/o erythema or drainage Eyes: PER, EOMI, sclera nonicteric.  Neck: Supple, no large masses.   Pulmonary:  Good air movement, no audible wheezing bilaterally, no use of accessory muscles.  Cardiac: RRR, no JVD Vascular: scattered varicosities present bilaterally.  Mild venous stasis changes to the legs bilaterally.  4+  soft pitting edema Vessel Right Left  Radial Palpable Palpable  PT Palpable Palpable  DP Palpable Palpable  Gastrointestinal: Non-distended. No guarding/no peritoneal signs.  Musculoskeletal: M/S 5/5 throughout.  No deformity or atrophy.  Neurologic: CN 2-12 intact. Symmetrical.  Speech is fluent. Motor exam as listed above. Psychiatric: Judgment intact, Mood & affect appropriate for pt's clinical situation. Dermatologic: No rashes or ulcers noted.  No changes consistent with cellulitis. Lymph : No lichenification or skin changes of chronic lymphedema.  CBC Lab Results  Component Value Date   WBC 7.0 04/25/2019   HGB 11.8 (L) 04/25/2019   HCT 39.8 04/25/2019   MCV 92.1 04/25/2019  PLT 204 04/25/2019    BMET    Component Value Date/Time   NA 144 04/25/2019 1138   NA 142 05/14/2016 1852   NA 140 06/02/2013 1230   K 4.9 04/25/2019 1138   K 3.5 06/02/2013 1230   CL 110 04/25/2019 1138   CL 107 06/02/2013 1230   CO2 28 04/25/2019 1138   CO2 26 06/02/2013 1230   GLUCOSE 92 04/25/2019 1138   GLUCOSE 178 (H) 06/02/2013 1230   BUN 30 (H) 04/25/2019 1138   BUN 27 (H) 05/14/2016 1852   BUN 14 06/02/2013 1230   CREATININE 2.65 (H) 04/25/2019 1138   CREATININE 1.87 (H) 04/05/2018 1008   CALCIUM 8.5 (L) 04/25/2019 1138   CALCIUM 9.5 06/02/2013 1230   GFRNONAA 19 (L) 04/25/2019 1138   GFRNONAA 29 (L) 04/05/2018 1008   GFRAA 22 (L) 04/25/2019 1138   GFRAA 34 (L) 04/05/2018 1008   CrCl cannot be calculated (Patient's most recent lab result is older than the maximum 21 days allowed.).  COAG Lab Results  Component Value Date   INR 0.94 08/13/2016    Radiology No results found.    Assessment/Plan 1. Lymphedema Recommend:  No surgery or intervention at this point in time.    I have reviewed my previous discussion with the patient regarding swelling and why it causes symptoms.  Patient will continue wearing graduated compression stockings class 1 (20-30 mmHg) on a daily  basis. The patient will  beginning wearing the stockings first thing in the morning and removing them in the evening. The patient is instructed specifically not to sleep in the stockings.    In addition, behavioral modification including several periods of elevation of the lower extremities during the day will be continued.  This was reviewed with the patient during the initial visit.  The patient will also continue routine exercise, especially walking on a daily basis as was discussed during the initial visit.    Despite conservative treatments including graduated compression therapy class 1 and behavioral modification including exercise and elevation the patient  has not obtained adequate control of the lymphedema.  The patient still has stage 3 lymphedema and therefore, I believe that a lymph pump should be added to improve the control of the patient's lymphedema.  Additionally, a lymph pump is warranted because it will reduce the risk of cellulitis and ulceration in the future.  Patient should follow-up in six months    2. Chronic venous insufficiency Recommend:  No surgery or intervention at this point in time.    I have reviewed my previous discussion with the patient regarding swelling and why it causes symptoms.  Patient will continue wearing graduated compression stockings class 1 (20-30 mmHg) on a daily basis. The patient will  beginning wearing the stockings first thing in the morning and removing them in the evening. The patient is instructed specifically not to sleep in the stockings.    In addition, behavioral modification including several periods of elevation of the lower extremities during the day will be continued.  This was reviewed with the patient during the initial visit.  The patient will also continue routine exercise, especially walking on a daily basis as was discussed during the initial visit.    Despite conservative treatments including graduated compression therapy  class 1 and behavioral modification including exercise and elevation the patient  has not obtained adequate control of the lymphedema.  The patient still has stage 3 lymphedema and therefore, I believe that a lymph pump should  be added to improve the control of the patient's lymphedema.  Additionally, a lymph pump is warranted because it will reduce the risk of cellulitis and ulceration in the future.  Patient should follow-up in six months    3. Essential hypertension Continue antihypertensive medications as already ordered, these medications have been reviewed and there are no changes at this time.   4. Type 2 diabetes mellitus with vascular disease ([place]) Continue hypoglycemic medications as already ordered, these medications have been reviewed and there are no changes at this time.  Hgb A1C to be monitored as already arranged by primary service   5. Mixed hyperlipidemia Continue statin as ordered and reviewed, no changes at this time    Hortencia Pilar, MD  07/03/2019 9:21 AM

## 2019-07-10 ENCOUNTER — Encounter (INDEPENDENT_AMBULATORY_CARE_PROVIDER_SITE_OTHER): Payer: Self-pay | Admitting: Vascular Surgery

## 2019-07-10 DIAGNOSIS — I890 Lymphedema, not elsewhere classified: Secondary | ICD-10-CM | POA: Insufficient documentation

## 2019-07-17 ENCOUNTER — Other Ambulatory Visit: Payer: Self-pay

## 2019-07-17 MED ORDER — VICTOZA 18 MG/3ML ~~LOC~~ SOPN: 1.8000 mg | PEN_INJECTOR | Freq: Every day | SUBCUTANEOUS | 2 refills | Status: DC

## 2019-07-18 NOTE — Progress Notes (Signed)
Patient ID: Emily [name], female    DOB: [birth year], 59 y.o.   MRN: [id number]  HPI  Emily [name] is a 59 y/o female with a history of asthma, DM, hyperlipidemia, HTN, stroke, thyroid disease, COPD, sleep apnea, former tobacco use and chronic heart failure.   Echo report from 02/13/2018 reviewed and showed an EF of 60-65% without LVH.    Was in the ED 04/25/19 due to COPD exacerbation. Given nebulizer and steroids and symptoms improved and patient was released.    She presents today for a follow-up visit with a chief complaint of moderate fatigue upon minimal exertion. She describes this as chronic in nature having been present for several years. She has associated shortness of breath and pedal edema long with this. She denies any dizziness, abdominal distention, palpitations, chest pin, cough or weight gain.   She admits that she is inconsistent with wearing her CPAP nightly. She also arrived with her oxygen concentrator battery low. She asks about refilling her nuvigil as she says that helped her "quite a bit".   Past Medical History:  Diagnosis Date  . Arthritis    hands, all over  . Asthma   . CHF (congestive heart failure) ([place])   . COPD (chronic obstructive pulmonary disease) (H[facility])    on 2L home o2  . Diabetes mellitus without complication ([place])    type 2  . Dyspnea   . Hyperlipidemia   . Hypertension   . Sleep apnea    CPAP  . Stroke [facility])    Was told at recent hospital visit she has stroke in past  . Thyroid disease   . Thyroid nodule 04/30/2016   Past Surgical History:  Procedure Laterality Date  . arm surgery Left   . COLONOSCOPY WITH PROPOFOL N/A 02/14/2019   Procedure: COLONOSCOPY WITH PROPOFOL;  Surgeon: [name], MD;  [facility] ENDOSCOPY;  Service: Endoscopy;  Laterality: N/A;   Family History  Problem Relation Age of Onset  . Diabetes Mother   . Hypertension Mother   . Diabetes Father   . Hypertension Father   . CAD Father   . Diabetes Sister     Social History   Tobacco Use  . Smoking status: Former Smoker    Packs/day: 1.50    Years: 40.00    Pack years: 60.00    Types: Cigarettes    Quit date: 01/13/2016    Years since quitting: 3.5  . Smokeless tobacco: Never Used  Substance Use Topics  . Alcohol use: No   No Known Allergies  Prior to Admission medications   Medication Sig Start Date End Date Taking? Authorizing Provider  Acetylcysteine (NAC) 600 MG CAPS Take by mouth daily.   Yes [provider]  albuterol (PROVENTIL HFA;VENTOLIN HFA) 108 (90 Base) MCG/ACT inhaler Inhale 1-2 puffs into the lungs every 6 (six) hours as needed for wheezing or shortness of breath. 07/16/16  Yes Doles-Johnson, Teah, NP  albuterol (PROVENTIL) (2.5 MG/3ML) 0.083% nebulizer solution Take 3 mLs (2.5 mg total) by nebulization every 6 (six) hours as needed for wheezing or shortness of breath. 11/18/17  Yes Wieting, Richard, MD  amLODipine (NORVASC) 10 MG tablet Take 10 mg by mouth daily. am   Yes [provider]  aspirin EC 81 MG tablet Take 81 mg by mouth daily.   Yes [provider]  atorvastatin (LIPITOR) 40 MG tablet TAKE ONE TABLET BY MOUTH EVERY DAY Patient taking differently: Take 40 mg by mouth at bedtime.  04/21/16  Yes Tawni Millers, MD  Blood Glucose Monitoring Suppl (ONE TOUCH ULTRA 2) w/Device KIT Use to check blood sugar 3 times a day. 07/28/19  Yes Philemon Kingdom, MD  budesonide-formoterol (SYMBICORT) 80-4.5 MCG/ACT inhaler Inhale 2 puffs into the lungs 2 (two) times daily. 11/18/17  Yes Loletha Grayer, MD  carvedilol (COREG) 25 MG tablet Take 1 tablet by mouth twice daily 05/06/19  Yes Kyesha Balla A, FNP  ergocalciferol (VITAMIN D2) 1.25 MG (50000 UT) capsule Take by mouth. 05/24/19 05/23/20 Yes [provider]  esomeprazole (NEXIUM) 40 MG capsule Take 40 mg by mouth daily. am   Yes [provider]  insulin glargine (LANTUS SOLOSTAR) 100 UNIT/ML Solostar Pen INJECT 30 UNITS in Am and 30  at night SUBCUTANEOUSLY TWICE DAILY 07/27/19  Yes Philemon Kingdom, MD  ipratropium (ATROVENT) 0.06 % nasal spray Place 2 sprays into both nostrils 4 (four) times daily.   Yes [provider]  methimazole (TAPAZOLE) 5 MG tablet Take 1 tablet (5 mg total) by mouth daily. 12/27/18  Yes Philemon Kingdom, MD  metolazone (ZAROXOLYN) 5 MG tablet Take 5 mg by mouth daily.   Yes [provider]  NOVOLOG FLEXPEN 100 UNIT/ML FlexPen INJECT 16-20 UNITS SUBCUTANEOUSLY 2x TIMES DAILY before meals 06/13/19  Yes Philemon Kingdom, MD  Semaglutide,0.25 or 0.5MG/DOS, (OZEMPIC, 0.25 OR 0.5 MG/DOSE,) 2 MG/1.5ML SOPN Inject 0.375 mLs (0.5 mg total) into the skin once a week. 07/27/19  Yes Philemon Kingdom, MD  tiotropium (SPIRIVA) 18 MCG inhalation capsule Place 18 mcg into inhaler and inhale daily.   Yes [provider]     Review of Systems  Constitutional: Positive for fatigue (easily). Negative for appetite change.  HENT: Negative for congestion, postnasal drip and sore throat.   Eyes: Positive for visual disturbance ("blurry vision").  Respiratory: Positive for shortness of breath (with moderate exertion). Negative for cough and wheezing.   Cardiovascular: Positive for leg swelling. Negative for chest pain and palpitations.  Gastrointestinal: Negative for abdominal distention and abdominal pain.  Endocrine: Negative.   Genitourinary: Negative.   Musculoskeletal: Negative for back pain and neck pain.  Skin: Negative.   Allergic/Immunologic: Negative.   Neurological: Negative for dizziness and light-headedness.  Hematological: Negative for adenopathy. Does not bruise/bleed easily.  Psychiatric/Behavioral: Negative for dysphoric mood and sleep disturbance (sleeping on 2-3 pillows). The patient is not nervous/anxious.    Vitals:   07/31/19 0843 07/31/19 0858  BP: (!) 132/102 (!) 140/82  Pulse: 95   Resp: 18   SpO2: 94%   Weight: (!) 216 lb (98 kg)   Height: '5\' 4"'  (1.626 m)     Wt Readings from Last 3 Encounters:  07/31/19 (!) 216 lb (98 kg)  07/27/19 (!) 218 lb (98.9 kg)  07/03/19 223 lb 3.2 oz (101.2 kg)   Lab Results  Component Value Date   CREATININE 2.65 (H) 04/25/2019   CREATININE 1.87 (H) 04/05/2018   CREATININE 2.46 (H) 02/18/2018    Physical Exam Vitals and nursing note reviewed.  Constitutional:      Appearance: Normal appearance.  HENT:     Head: Normocephalic and atraumatic.  Cardiovascular:     Rate and Rhythm: Normal rate and regular rhythm.  Pulmonary:     Effort: Pulmonary effort is normal. No respiratory distress.     Breath sounds: No wheezing or rales.  Abdominal:     General: There is no distension.     Palpations: Abdomen is soft.  Musculoskeletal:  General: No tenderness.     Cervical back: Normal range of motion and neck supple.     Right lower leg: Edema (trace pitting) present.     Left lower leg: Edema (trace pitting) present.  Skin:    General: Skin is warm and dry.  Neurological:     General: No focal deficit present.     Mental Status: She is alert and oriented to person, place, and time.  Psychiatric:        Mood and Affect: Mood normal.        Behavior: Behavior normal.        Thought Content: Thought content normal.     Assessment & Plan:  1: Chronic heart failure with preserved ejection fraction- - NYHA class III - euvolemic today - weighing daily; reminded to call for an overnight weight gain of >2 pounds or a weekly weight gain of >5 pounds - weight down 5 pounds from last visit here 3 months ago - not adding salt and has been using Mrs. Dash for seasoning. - wearing compression socks daily with removal at bedtime with improvement in edema; says that she should be getting compression boots "soon" - BNP 04/25/19 was 275.0 - says that she's received both her COVID vaccines  2: HTN- - BP initially elevated (132/102) but her oxygen concentrator battery was almost dead; rechecked BP at the end of  her visit after being on her oxygen and her BP was 140/82 - saw PCP recently but can't remember the date - BMP from 05/19/2019 reviewed and showed sodium 139, potassium 3.8, creatinine 2.38 and GFR 25  3: COPD- - saw pulmonology Lanney Gins) 04/20/19 - wearing oxygen at 3-4L around the clock  - pulmonology office # given to patient so that she can call him regarding her nuvigil  4: Obstructive sleep apnea- - has CPAP but she says that she doesn't wear it nightly; encouraged her to wear it nightly and explained that over time, it will help her feel better - on nuvigil from pulmonology  5: DM with CKD- - fasting glucose at home today was 98 - A1c 06/13/19 was 9.3% - saw endocrinology Cruzita Lederer) 07/27/19 - saw nephrology ([place]) 05/24/19   Medication bottles were reviewed.   Return in 6 months or sooner for any questions/problems before then.

## 2019-07-25 ENCOUNTER — Other Ambulatory Visit: Payer: Self-pay

## 2019-07-25 ENCOUNTER — Telehealth: Payer: Self-pay

## 2019-07-25 MED ORDER — VICTOZA 18 MG/3ML ~~LOC~~ SOPN: 1.8000 mg | PEN_INJECTOR | Freq: Every day | SUBCUTANEOUS | 2 refills | Status: DC

## 2019-07-25 NOTE — Telephone Encounter (Signed)
Prior authorization for Victoza has been approved by patient's insurance.  Coverage is effective 04/25/2019 no end date listed.  PA Approval/Reference Number [id number]  Approval letter has been sent to scanning.

## 2019-07-25 NOTE — Telephone Encounter (Signed)
Prior authorization for Victoza has been approved by patient's insurance.  Coverage is effective 04/25/2019 until further notice  PA Approval/Reference Number [id number]  Approval letter has been sent to scanning.

## 2019-07-26 ENCOUNTER — Telehealth: Payer: Self-pay

## 2019-07-26 MED ORDER — OZEMPIC (0.25 OR 0.5 MG/DOSE) 2 MG/1.5ML ~~LOC~~ SOPN: 0.5000 mg | PEN_INJECTOR | SUBCUTANEOUS | 4 refills | Status: DC

## 2019-07-26 NOTE — Telephone Encounter (Signed)
Insurance has now approved Ozempic which Dr. Cruzita Lederer wanted patient to have rather than Victoza.  New RX for Ozempic sent to pharmacy.  Prior authorization for Ozempic has been approved by patient's insurance.  Coverage is effective 04/26/2019 to end date listed.  PA Approval/Reference Number [id number].  Approval letter has been sent to scanning.  Unable to reach patient by phone, she does have appointment tomorrow.

## 2019-07-27 ENCOUNTER — Ambulatory Visit (INDEPENDENT_AMBULATORY_CARE_PROVIDER_SITE_OTHER): Payer: Medicaid Other | Admitting: Internal Medicine

## 2019-07-27 ENCOUNTER — Encounter: Payer: Self-pay | Admitting: Internal Medicine

## 2019-07-27 ENCOUNTER — Other Ambulatory Visit: Payer: Self-pay

## 2019-07-27 VITALS — BP 118/70 | HR 90 | Ht 64.0 in | Wt 218.0 lb

## 2019-07-27 DIAGNOSIS — E049 Nontoxic goiter, unspecified: Secondary | ICD-10-CM

## 2019-07-27 DIAGNOSIS — E040 Nontoxic diffuse goiter: Secondary | ICD-10-CM

## 2019-07-27 DIAGNOSIS — E0590 Thyrotoxicosis, unspecified without thyrotoxic crisis or storm: Secondary | ICD-10-CM | POA: Diagnosis not present

## 2019-07-27 DIAGNOSIS — E108 Type 1 diabetes mellitus with unspecified complications: Secondary | ICD-10-CM

## 2019-07-27 DIAGNOSIS — E782 Mixed hyperlipidemia: Secondary | ICD-10-CM

## 2019-07-27 DIAGNOSIS — E1159 Type 2 diabetes mellitus with other circulatory complications: Secondary | ICD-10-CM | POA: Diagnosis not present

## 2019-07-27 DIAGNOSIS — E1165 Type 2 diabetes mellitus with hyperglycemia: Secondary | ICD-10-CM

## 2019-07-27 DIAGNOSIS — E041 Nontoxic single thyroid nodule: Secondary | ICD-10-CM

## 2019-07-27 LAB — LIPID PANEL
Cholesterol: 135 mg/dL (ref 0–200)
HDL: 46.3 mg/dL (ref >39.00)
LDL Cholesterol: 67 mg/dL (ref 0–99)
NonHDL: 88.56
Total CHOL/HDL Ratio: 3
Triglycerides: 109 mg/dL (ref 0.0–149.0)
VLDL: 21.8 mg/dL (ref 0.0–40.0)

## 2019-07-27 MED ORDER — LANTUS SOLOSTAR 100 UNIT/ML ~~LOC~~ SOPN: PEN_INJECTOR | SUBCUTANEOUS | 3 refills | Status: DC

## 2019-07-27 MED ORDER — OZEMPIC (0.25 OR 0.5 MG/DOSE) 2 MG/1.5ML ~~LOC~~ SOPN: 0.5000 mg | PEN_INJECTOR | SUBCUTANEOUS | 3 refills | Status: DC

## 2019-07-27 NOTE — Progress Notes (Addendum)
Patient ID: Emily [name], female   DOB: 1960-11-01, 59 y.o.   MRN: [id number]  This visit occurred during the SARS-CoV-2 public health emergency.  Safety protocols were in place, including screening questions prior to the visit, additional usage of staff PPE, and extensive cleaning of exam room while observing appropriate contact time as indicated for disinfecting solutions.   HPI  Emily [name] is a 59 y.o.-year-old female, returning for f/u for thyrotoxicosis/goiter and DM2, uncontrolled, insulin-dependent, with complications (CKD stage 3, cerebro-vascular ds. - s/p stroke). Last visit 1.5 months ago.  TMNG/Graves ds.: Reviewed and addended history: Pt was dx'ed with hyperthyroidism in ~2014 >> started MMI, lately 10 mg 2x daily. She was lost for f/u at [facility] as she could not travel over there.  Thyroid uptake and scan (06/22/2013) showed: FINDINGS: The 24 hour uptake was 24.7%. The normal reference range at 24 hours is 15 to 35%.   The thyroid scan demonstrates symmetric, mildly heterogenous distribution of radiopharmaceutical throughout the thyroid, which is increased in size.  IMPRESSION: Mildly heterogenous iodine uptake, cannot exclude toxic multinodular goiter versus possible mild Graves disease. Consider ultrasound for further correlation.  Patient was on methimazole before, however, this was stopped after  TSH returned very elevated, in the 90s (02/2015) >> TSH decreased to 8, then normalized >> we continued her off MMI.   However,she was started on MMI 10 mg 2x a day (re-Rx by Dr. Rosanna Randy: 04/17/2016, [facility]) - I do not think he was aware that we had stopped MMI.  We stopped methimazole again in 05/2016.  However, we had to restart again methimazole in 05/2017.  She was on 5 mg daily, but this was stopped during the hospitalization for COPD and CHF exacerbation 02/2018.  In 12/2018 we had to restart methimazole.  She is taking 5 mg daily.  Reviewed her  TFTs: Lab Results  Component Value Date   TSH 2.99 06/13/2019   TSH 2.09 03/06/2019   TSH 0.15 (L) 12/26/2018   TSH 0.37 07/29/2018   TSH 0.45 04/05/2018   TSH 0.757 11/14/2017   TSH 1.61 09/27/2017   TSH 0.03 (L) 05/24/2017   TSH 0.54 08/20/2016   TSH 2.840 01/14/2016   FREET4 0.66 06/13/2019   FREET4 0.75 03/06/2019   FREET4 0.90 12/26/2018   FREET4 0.81 07/29/2018   FREET4 0.79 04/05/2018   FREET4 0.71 09/27/2017   FREET4 0.92 05/24/2017   FREET4 1.10 08/20/2016   FREET4 0.47 (L) 12/10/2015   FREET4 0.32 (L) 11/07/2015   Previously:   Thyroid U/S (05/12/2016): Large isthmic nodule Thyroid tissue is diffusely heterogeneous and enlarged.  The isthmus has a large isoechoic nodular structure. Unclear if this represents a discrete nodule versus focal heterogeneous thyroid tissue. This area measures up to 3.3 cm and recommend biopsy of this area.  Numerous small nodules and cysts throughout the left and right thyroid lobes. These predominantly cystic nodules do not meet criteria for biopsy or dedicated follow-up.  FNA of the isthmic nodule (06/25/2016): Benign  Pt denies: - feeling nodules in neck - hoarseness - dysphagia - choking - SOB with lying down  Pt does have a FH of thyroid ds.: aunt. No FH of thyroid cancer. No h/o radiation tx to head or neck.  No herbal supplements. No Biotin use. No recent steroids use.   DM2: She has a history of noncompliance with medications. She had several complications: History of stroke, also CKD, DR.  Reviewed HbA1c levels: Lab Results  Component Value Date  HGBA1C 9.3 (A) 06/13/2019   HGBA1C 7.2 (A) 12/26/2018   HGBA1C 7.6 (A) 07/29/2018   She was on: - Lantus 45 units 2x a day >> was off insulin - "the pharmacy did not receive it" - Januvia 50 mg in am >> stopped (?) - Glipizide 10 mg 2x a day >> started in the hospital We added Jardiance at last visit >> not taking  - "the pharmacy did not receive it" She was  on Metformin >> diarrhea.  Now on: - Trulicity 1.5 mg weekly >> Ozempic 0.5 mg weekly >> off for >1 mo >> now Ozempic approved since 07/25/2019  - Lantus 50 units in a.m. and 30 units at bedtime - NovoLog 16 units before breakfast and 16-20 units before dinner (take this 15 min before meals) We had to start Jardiance 10 mg 02/2018 due to decreased kidney function.  She is checking sugars twice a day: - am:  91-152, 165, 183 >> 130-333>> 78-176, 186, 194 - 2h after b'fast: n/c - lunch: n/c - 2h after lunch: n/c >> 178 >> 121 - dinner: n/c >> 141 >> n/c >> 148, 282 >> 110-146 - 2h after dinner: 185-269 >> 119, 122 >> n/c >> 82-166, 186 - bedtime: 159-281 >> 97-102 >>  227-453 >> 136-209 - nighttime: 139-197, 211, 217 >> see above >> 122-22, 320, 363  + CKD; latest BUN/Cr: Lab Results  Component Value Date   BUN 30 (H) 04/25/2019   Lab Results  Component Value Date   CREATININE 2.65 (H) 04/25/2019  GFR 24 (02/18/2018)  + HL.  Latest lipid profile: Lab Results  Component Value Date   CHOL 143 07/29/2018   HDL 42.20 07/29/2018   LDLCALC 70 07/29/2018   TRIG 150.0 (H) 07/29/2018   CHOLHDL 3 07/29/2018  On Lipitor 40.  Latest dilated eye exam was in 01/2018: + DR  ROS: Constitutional: no weight gain/no weight loss, no fatigue, no subjective hyperthermia, no subjective hypothermia Eyes: no blurry vision, no xerophthalmia ENT: no sore throat, + see HPI Cardiovascular: no CP/no SOB/no palpitations/+ leg swelling Respiratory: no cough/no SOB/no wheezing Gastrointestinal: no N/no V/no D/no C/no acid reflux Musculoskeletal: no muscle aches/no joint aches Skin: no rashes, no hair loss Neurological: no tremors/no numbness/no tingling/no dizziness  I reviewed pt's medications, allergies, PMH, social hx, family hx, and changes were documented in the history of present illness. Otherwise, unchanged from my initial visit note.  Past Medical History:  Diagnosis Date   Arthritis     hands, all over   Asthma    CHF (congestive heart failure) (HCC)    COPD (chronic obstructive pulmonary disease) (HCC)    on 2L home o2   Diabetes mellitus without complication (HCC)    type 2   Dyspnea    Hyperlipidemia    Hypertension    Sleep apnea    CPAP   Stroke [facility])    Was told at recent hospital visit she has stroke in past   Thyroid disease    Thyroid nodule 04/30/2016   Past Surgical History:  Procedure Laterality Date   arm surgery Left    COLONOSCOPY WITH PROPOFOL N/A 02/14/2019   Procedure: COLONOSCOPY WITH PROPOFOL;  Surgeon: [name], MD;  Location: [facility] ENDOSCOPY;  Service: Endoscopy;  Laterality: N/A;   Social History   Social History   Marital Status: Single    Spouse Name: N/A   Number of Children: 1   Occupational History   None   Social History Main  Topics   Smoking status: Current Every Day Smoker -- 1.00 packs/day   Smokeless tobacco: Not on file   Alcohol Use: No   Drug Use: No   Current Outpatient Medications on File Prior to Visit  Medication Sig Dispense Refill   Acetylcysteine (NAC) 600 MG CAPS Take by mouth daily.     albuterol (PROVENTIL HFA;VENTOLIN HFA) 108 (90 Base) MCG/ACT inhaler Inhale 1-2 puffs into the lungs every 6 (six) hours as needed for wheezing or shortness of breath. 1 Inhaler 11   albuterol (PROVENTIL) (2.5 MG/3ML) 0.083% nebulizer solution Take 3 mLs (2.5 mg total) by nebulization every 6 (six) hours as needed for wheezing or shortness of breath. 100 vial 0   amLODipine (NORVASC) 10 MG tablet Take 10 mg by mouth daily. am     aspirin EC 81 MG tablet Take 81 mg by mouth daily.     atorvastatin (LIPITOR) 40 MG tablet TAKE ONE TABLET BY MOUTH EVERY DAY (Patient taking differently: Take 40 mg by mouth at bedtime. ) 90 tablet PRN   budesonide-formoterol (SYMBICORT) 80-4.5 MCG/ACT inhaler Inhale 2 puffs into the lungs 2 (two) times daily. 1 Inhaler 12   carvedilol (COREG) 25 MG tablet Take 1  tablet by mouth twice daily 180 tablet 3   cyclobenzaprine (FLEXERIL) 5 MG tablet TAKE ONE TABLET BY MOUTH EVERY DAY AS NEEDED FOR MUSCLE SPASMS (Patient taking differently: Take 5 mg by mouth daily as needed for muscle spasms. ) 30 tablet 0   ergocalciferol (VITAMIN D2) 1.25 MG (50000 UT) capsule Take by mouth.     esomeprazole (NEXIUM) 40 MG capsule Take 40 mg by mouth daily. am     Insulin Glargine (LANTUS SOLOSTAR) 100 UNIT/ML Solostar Pen INJECT 50 UNITS in Am and 30 at night SUBCUTANEOUSLY TWICE DAILY 72 mL 1   ipratropium (ATROVENT) 0.06 % nasal spray Place 2 sprays into both nostrils 4 (four) times daily.     methimazole (TAPAZOLE) 5 MG tablet Take 1 tablet (5 mg total) by mouth daily. (Patient taking differently: Take 5 mg by mouth daily. am) 90 tablet 3   metolazone (ZAROXOLYN) 5 MG tablet Take 5 mg by mouth daily.     NOVOLOG FLEXPEN 100 UNIT/ML FlexPen INJECT 16-20 UNITS SUBCUTANEOUSLY 2x TIMES DAILY before meals 15 mL 5   potassium chloride (KLOR-CON) 10 MEQ tablet Take 10 mEq by mouth 3 (three) times a week.      Semaglutide,0.25 or 0.5MG /DOS, (OZEMPIC, 0.25 OR 0.5 MG/DOSE,) 2 MG/1.5ML SOPN Inject 0.375 mLs (0.5 mg total) into the skin once a week. 1 pen 4   tiotropium (SPIRIVA) 18 MCG inhalation capsule Place 18 mcg into inhaler and inhale daily.     No current facility-administered medications on file prior to visit.   No Known Allergies Family History  Problem Relation Age of Onset   Diabetes Mother    Hypertension Mother    Diabetes Father    Hypertension Father    CAD Father    Diabetes Sister    PE: BP 118/70    Pulse 90    Ht 5\' 4"  (1.626 m)    Wt (!) 218 lb (98.9 kg)    SpO2 92%    BMI 37.42 kg/m  Body mass index is 37.42 kg/m.  She is usually on oxygen, but not now.  Wt Readings from Last 3 Encounters:  07/03/19 223 lb 3.2 oz (101.2 kg)  06/13/19 224 lb (101.6 kg)  05/03/19 221 lb (100.2 kg)   Constitutional: overweight,  in NAD Eyes: PERRLA,  EOMI, no exophthalmos ENT: moist mucous membranes, +L>R thyromegaly, no cervical lymphadenopathy Cardiovascular: RRR, No MRG, + L>R LE edema (wears compression hoses) Respiratory: CTA B Gastrointestinal: abdomen soft, NT, ND, BS+ Musculoskeletal: no deformities, strength intact in all 4 Skin: moist, warm, no rashes Neurological: no tremor with outstretched hands, DTR normal in all 4  ASSESSMENT: 1. Thyrotoxicosis  2. Large goiter US SOFT TISSUE HEAD AND NECK  Order: [id number]  Status:  Final result Visible to patient:  No (Not Released) Dx:  Goiter diffuse  Details   Reading Physician Reading Date Result Priority  Markus Daft, MD 05/12/2016   Narrative    CLINICAL DATA: Thyromegaly on previous chest CT.  EXAM: THYROID ULTRASOUND  TECHNIQUE: Ultrasound examination of the thyroid gland and adjacent soft tissues was performed.  COMPARISON: Chest CT 01/14/2016  FINDINGS: Parenchymal Echotexture: Moderately heterogenous  Isthmus: 1.2 cm in the AP dimension  Right lobe: 8.4 x 2.8 x 4.5 cm  Left lobe: 8.2 x 3.0 x 3.7 cm  _________________________________________________________  Estimated total number of nodules >/= 1 cm: 6-10  Number of spongiform nodules >/= 2 cm not described below (TR1): 0  Number of mixed cystic and solid nodules >/= 1.5 cm not described below ([place]): 0  _________________________________________________________  Nodule # 1:  Location: Isthmus; Inferior  Maximum size: 3.3 cm; Other 2 dimensions: 1.7 x 2.7 cm  Composition: solid/almost completely solid (2)  Echogenicity: isoechoic (1)  **Given size (>/= 2.5 cm) and appearance, fine needle aspiration of this mildly suspicious nodule should be considered based on TI-RADS criteria.  _________________________________________________________  Mildly complex cystic structure in the superior right thyroid lobe measuring up to 1.0 cm. Minimally complex cyst in the superior right thyroid lobe  measuring up to 1.6 cm. Adjacent complex cysts or spongiform nodules in the mid right thyroid lobe, largest measuring 1.1 cm. Spongiform nodule in the mid left thyroid lobe measuring up to 1.3 cm.  Nodule # 2:  Location: Left; Mid  Maximum size: 2.0 cm; Other 2 dimensions: 0.8 x 0.8 cm  Composition: mixed cystic and solid (1)  Echogenicity: cannot determine (1)  This nodule does NOT meet TI-RADS criteria for biopsy or dedicated follow-up.  _________________________________________________________  IMPRESSION: Thyroid tissue is diffusely heterogeneous and enlarged.  The isthmus has a large isoechoic nodular structure. Unclear if this represents a discrete nodule versus focal heterogeneous thyroid tissue. This area measures up to 3.3 cm and recommend biopsy of this area.  Numerous small nodules and cysts throughout the left and right thyroid lobes. These predominantly cystic nodules do not meet criteria for biopsy or dedicated follow-up.  The above is in keeping with the ACR TI-RADS recommendations - J Am Coll Radiol 2017;14:587-595.   Electronically Signed By: Markus Daft M.D. On: 05/12/2016 12:03        Addendum: Adequacy Reason Satisfactory For Evaluation. Diagnosis THYROID, FINE NEEDLE ASPIRATION ISTHMUS INFERIOR (SPECIMEN 1 OF 1, COLLECTED ON 06/25/2016): CONSISTENT WITH BENIGN FOLLICULAR NODULE ([place] CATEGORY II). Casimer Lanius MD Pathologist, Electronic Signature (Case signed 06/26/2016) Specimen Clinical Information Nodule 1 Isthmus Inferior 3.3 cm; Other 2 dimensions: 1.7 x 2.7 cm, Solid / almost completely solid, Isoechoic, ACR TI-RADS total points: 3, Midly suspicious nodule Source Thyroid, Fine Needle Aspiration, Isthmus Inferior, (Specimen 1 of 1, colected on 06/25/16 )  3. DM2, insulin dependent, uncontrolled  PLAN:  1. And 2.  -She has a history of thyrotoxicosis, and per review of her thyroid uptake and scan report, this is either  due to  mild Graves' disease or mild toxic multinodular goiter -Her TFTs were previously normal on low-dose methimazole, which was stopped during her last hospitalization 02/2018, unclear why but at last visit TSH was slightly low -We restarted methimazole 5 mg daily and she continues on this -We rechecked her TFTs 1.5 and 4 months ago and these were normal. -We will recheck the tests at next visit.  2. Large goiter -She has minimal neck compression symptoms, not bothersome -Reviewed the report of her latest thyroid ultrasound and biopsy.  The dominant nodules measured 3.3 cm-had a benign biopsy -We will continue to monitor her clinically and also we will check another ultrasound at next visit  3. DM2 - Patient with longstanding, uncontrolled, type 2 diabetes, on a basal-bolus insulin regimen, and previously on Ozempic also stopped approximately 1 month before last visit due to lack of insurance coverage.  We sent a preauthorization form and this was approved.  I advised her to restart the Ozempic at last visit.  At that time, sugars were much higher compared to before and it was unclear whether this was due to stopping Ozempic, a poor diet, not taking NovoLog or insulin degradation, since the pens of insulin that she brought with her were warm to touch.  At that time I advised her to change the pens and keep them cool.  We did not change her medication doses. -At this visit, it appears that the insurance wanted her to try Victoza or Bydureon before trying Ozempic.  We sent a prescription for Victoza and, Ozempic was now approved (yesterday).  I advised her to restart Ozempic. -recent HbA1c was higher, at 9.3%  -At this visit, we reviewed her CBG logs and it appears that her sugars have improved throughout the day, but she still has some high blood sugars at night, even in the 300s, improving, however.  As we are starting Ozempic, I advised her to reduce the Lantus dose in the morning.  We will continue with  the same dose of NovoLog. - I advised her to: Patient Instructions  Please decrease: - Lantus 30 units in a.m. and 30 units at bedtime  Continue: - NovoLog 16 units before breakfast and 16-20 units before dinner   Please restart: - Ozempic 0.5 mg weekly  Please return in 2 months with your sugar log.   - advised to check sugars at different times of the day - 3x a day, rotating check times - advised for yearly eye exams >> she is not UTD - will check a lipid panel today - return to clinic in 2 months  CC: PCP: Gennette Pac, FNP - [phone]  Component     Latest Ref Rng & Units 07/27/2019  Cholesterol     0 - 200 mg/dL 135  Triglycerides     0 - 149 mg/dL 109.0  HDL Cholesterol     >39.00 mg/dL 46.30  VLDL     0.0 - 40.0 mg/dL 21.8  LDL (calc)     0 - 99 mg/dL 67  Total CHOL/HDL Ratio      3  NonHDL      88.56  Lipids are excellent.  Philemon Kingdom, MD PhD [facility]

## 2019-07-27 NOTE — Patient Instructions (Signed)
Please decrease: - Lantus 30 units in a.m. and 30 units at bedtime  Continue: - NovoLog 16 units before breakfast and 16-20 units before dinner   Please restart: - Ozempic 0.5 mg weekly  Please return in 2 months with your sugar log.

## 2019-07-28 ENCOUNTER — Telehealth: Payer: Self-pay | Admitting: Internal Medicine

## 2019-07-28 MED ORDER — ONETOUCH ULTRA 2 W/DEVICE KIT: PACK | 0 refills | Status: DC

## 2019-07-28 NOTE — Telephone Encounter (Signed)
RX sent

## 2019-07-28 NOTE — Telephone Encounter (Signed)
Patient called stating she needed a new meter - OneTouch ultra 2.  [place] ([place]), [place] - [address] Phone:  [phone]  Fax:  [phone]

## 2019-07-31 ENCOUNTER — Encounter: Payer: Self-pay | Admitting: Family

## 2019-07-31 ENCOUNTER — Other Ambulatory Visit: Payer: Self-pay

## 2019-07-31 ENCOUNTER — Ambulatory Visit: Payer: Medicaid Other | Attending: Family | Admitting: Family

## 2019-07-31 VITALS — BP 140/82 | HR 95 | Resp 18 | Ht 64.0 in | Wt 216.0 lb

## 2019-07-31 DIAGNOSIS — Z7982 Long term (current) use of aspirin: Secondary | ICD-10-CM | POA: Insufficient documentation

## 2019-07-31 DIAGNOSIS — M1990 Unspecified osteoarthritis, unspecified site: Secondary | ICD-10-CM | POA: Diagnosis not present

## 2019-07-31 DIAGNOSIS — E1122 Type 2 diabetes mellitus with diabetic chronic kidney disease: Secondary | ICD-10-CM | POA: Insufficient documentation

## 2019-07-31 DIAGNOSIS — Z79899 Other long term (current) drug therapy: Secondary | ICD-10-CM | POA: Diagnosis not present

## 2019-07-31 DIAGNOSIS — Z7951 Long term (current) use of inhaled steroids: Secondary | ICD-10-CM | POA: Diagnosis not present

## 2019-07-31 DIAGNOSIS — Z87891 Personal history of nicotine dependence: Secondary | ICD-10-CM | POA: Insufficient documentation

## 2019-07-31 DIAGNOSIS — Z833 Family history of diabetes mellitus: Secondary | ICD-10-CM | POA: Insufficient documentation

## 2019-07-31 DIAGNOSIS — J449 Chronic obstructive pulmonary disease, unspecified: Secondary | ICD-10-CM | POA: Insufficient documentation

## 2019-07-31 DIAGNOSIS — I10 Essential (primary) hypertension: Secondary | ICD-10-CM

## 2019-07-31 DIAGNOSIS — I130 Hypertensive heart and chronic kidney disease with heart failure and stage 1 through stage 4 chronic kidney disease, or unspecified chronic kidney disease: Secondary | ICD-10-CM | POA: Insufficient documentation

## 2019-07-31 DIAGNOSIS — Z8673 Personal history of transient ischemic attack (TIA), and cerebral infarction without residual deficits: Secondary | ICD-10-CM | POA: Diagnosis not present

## 2019-07-31 DIAGNOSIS — E079 Disorder of thyroid, unspecified: Secondary | ICD-10-CM | POA: Diagnosis not present

## 2019-07-31 DIAGNOSIS — Z794 Long term (current) use of insulin: Secondary | ICD-10-CM | POA: Insufficient documentation

## 2019-07-31 DIAGNOSIS — G4733 Obstructive sleep apnea (adult) (pediatric): Secondary | ICD-10-CM

## 2019-07-31 DIAGNOSIS — I509 Heart failure, unspecified: Secondary | ICD-10-CM | POA: Insufficient documentation

## 2019-07-31 DIAGNOSIS — Z8249 Family history of ischemic heart disease and other diseases of the circulatory system: Secondary | ICD-10-CM | POA: Diagnosis not present

## 2019-07-31 DIAGNOSIS — E785 Hyperlipidemia, unspecified: Secondary | ICD-10-CM | POA: Diagnosis not present

## 2019-07-31 DIAGNOSIS — N189 Chronic kidney disease, unspecified: Secondary | ICD-10-CM | POA: Diagnosis not present

## 2019-07-31 DIAGNOSIS — I5032 Chronic diastolic (congestive) heart failure: Secondary | ICD-10-CM

## 2019-07-31 NOTE — Patient Instructions (Signed)
Continue weighing daily and call for an overnight weight gain of > 2 pounds or a weekly weight gain of >5 pounds. 

## 2019-08-27 ENCOUNTER — Other Ambulatory Visit: Payer: Self-pay | Admitting: Internal Medicine

## 2019-08-27 DIAGNOSIS — E108 Type 1 diabetes mellitus with unspecified complications: Secondary | ICD-10-CM

## 2019-09-05 ENCOUNTER — Other Ambulatory Visit: Payer: Self-pay

## 2019-09-05 MED ORDER — ACCU-CHEK SOFTCLIX LANCETS MISC: 12 refills | Status: DC

## 2019-09-05 MED ORDER — ACCU-CHEK GUIDE VI STRP: ORAL_STRIP | 12 refills | Status: DC

## 2019-09-05 MED ORDER — ACCU-CHEK GUIDE W/DEVICE KIT: PACK | 0 refills | Status: AC

## 2019-09-05 NOTE — Telephone Encounter (Signed)
Pharmacy calling to request testing supplies be changed to Accu-Chek Guide.  New RX's sent.

## 2019-09-14 ENCOUNTER — Ambulatory Visit: Payer: Medicaid Other | Admitting: Podiatry

## 2019-09-25 ENCOUNTER — Encounter: Payer: Self-pay | Admitting: Podiatry

## 2019-09-25 ENCOUNTER — Other Ambulatory Visit: Payer: Self-pay

## 2019-09-25 ENCOUNTER — Ambulatory Visit: Payer: Medicaid Other | Admitting: Podiatry

## 2019-09-25 DIAGNOSIS — B351 Tinea unguium: Secondary | ICD-10-CM | POA: Diagnosis not present

## 2019-09-25 DIAGNOSIS — M79674 Pain in right toe(s): Secondary | ICD-10-CM

## 2019-09-25 DIAGNOSIS — I872 Venous insufficiency (chronic) (peripheral): Secondary | ICD-10-CM

## 2019-09-25 DIAGNOSIS — E1159 Type 2 diabetes mellitus with other circulatory complications: Secondary | ICD-10-CM

## 2019-09-25 DIAGNOSIS — M79675 Pain in left toe(s): Secondary | ICD-10-CM | POA: Diagnosis not present

## 2019-09-25 DIAGNOSIS — N189 Chronic kidney disease, unspecified: Secondary | ICD-10-CM

## 2019-09-25 NOTE — Progress Notes (Signed)
Complaint:  Visit Type: Patient returns to my office for continued preventative foot care services. Complaint: Patient states" my nails have grown long and thick and become painful to walk and wear shoes" Patient has been diagnosed with DM with vascular disease and chronic venous insufficiency and chronic kidney disease.. The patient presents for preventative foot care services.  Podiatric Exam: Vascular: dorsalis pedis and posterior tibial pulses are weakly  palpable bilateral. Capillary return is immediate. Temperature gradient is WNL. Skin turgor WNL  Sensorium: Normal Semmes Weinstein monofilament test. Normal tactile sensation bilaterally. Nail Exam: Pt has thick disfigured discolored nails with subungual debris noted bilateral entire nail hallux through fifth toenails Ulcer Exam: There is no evidence of ulcer or pre-ulcerative changes or infection. Orthopedic Exam: Muscle tone and strength are WNL. No limitations in general ROM. No crepitus or effusions noted. Foot type and digits show no abnormalities. Bony prominences are unremarkable. Skin: No Porokeratosis. No infection or ulcers  Diagnosis:  Onychomycosis, , Pain in right toe, pain in left toes  Treatment & Plan Procedures and Treatment: Consent by patient was obtained for treatment procedures.   Debridement of mycotic and hypertrophic toenails, 1 through 5 bilateral and clearing of subungual debris. No ulceration, no infection noted.  Return Visit-Office Procedure: Patient instructed to return to the office for a follow up visit 3 months for continued evaluation and treatment.    Gardiner Barefoot DPM

## 2019-09-28 ENCOUNTER — Other Ambulatory Visit: Payer: Self-pay | Admitting: Internal Medicine

## 2019-10-03 ENCOUNTER — Other Ambulatory Visit: Payer: Self-pay

## 2019-10-03 ENCOUNTER — Ambulatory Visit (INDEPENDENT_AMBULATORY_CARE_PROVIDER_SITE_OTHER): Payer: Medicaid Other | Admitting: Internal Medicine

## 2019-10-03 ENCOUNTER — Encounter: Payer: Self-pay | Admitting: Internal Medicine

## 2019-10-03 VITALS — BP 124/76 | HR 76 | Ht 64.0 in | Wt 217.4 lb

## 2019-10-03 DIAGNOSIS — E0590 Thyrotoxicosis, unspecified without thyrotoxic crisis or storm: Secondary | ICD-10-CM

## 2019-10-03 DIAGNOSIS — E041 Nontoxic single thyroid nodule: Secondary | ICD-10-CM

## 2019-10-03 DIAGNOSIS — E1159 Type 2 diabetes mellitus with other circulatory complications: Secondary | ICD-10-CM | POA: Diagnosis not present

## 2019-10-03 DIAGNOSIS — E1165 Type 2 diabetes mellitus with hyperglycemia: Secondary | ICD-10-CM

## 2019-10-03 LAB — POCT GLYCOSYLATED HEMOGLOBIN (HGB A1C): Hemoglobin A1C: 7.4 % — AB (ref 4.0–5.6)

## 2019-10-03 LAB — T3, FREE: T3, Free: 3.7 pg/mL (ref 2.3–4.2)

## 2019-10-03 LAB — T4, FREE: Free T4: 0.57 ng/dL — ABNORMAL LOW (ref 0.60–1.60)

## 2019-10-03 LAB — TSH: TSH: 4.37 u[IU]/mL (ref 0.35–4.50)

## 2019-10-03 MED ORDER — OZEMPIC (1 MG/DOSE) 4 MG/3ML ~~LOC~~ SOPN: 1.0000 mg | PEN_INJECTOR | SUBCUTANEOUS | 3 refills | Status: DC

## 2019-10-03 NOTE — Patient Instructions (Addendum)
Please continue: - Lantus 30 units in a.m. and 30 units at bedtime - NovoLog 16 units before breakfast and 16-20 units before dinner   Please increase: - Ozempic 1 mg weekly  Continue: - Methimazole 5 mg daily.  Please stop at the lab.  Please return in 3 months with your sugar log.

## 2019-10-03 NOTE — Progress Notes (Addendum)
Patient ID: Emily [name], female   DOB: 07-24-1960, 59 y.o.   MRN: [id number]  This visit occurred during the SARS-CoV-2 public health emergency.  Safety protocols were in place, including screening questions prior to the visit, additional usage of staff PPE, and extensive cleaning of exam room while observing appropriate contact time as indicated for disinfecting solutions.   HPI  Emily [name] is a 59 y.o.-year-old female, returning for f/u for thyrotoxicosis/goiter and DM2, uncontrolled, insulin-dependent, with complications (CKD stage 3, cerebro-vascular ds. - s/p stroke). Last visit 2 months ago.  TMNG/Graves ds.: Reviewed and addended history: Pt was dx'ed with hyperthyroidism in ~2014 >> started MMI, lately 10 mg 2x daily. She was lost for f/u at [facility] as she could not travel over there.  Thyroid uptake and scan (06/22/2013) showed: FINDINGS: The 24 hour uptake was 24.7%. The normal reference range at 24 hours is 15 to 35%.   The thyroid scan demonstrates symmetric, mildly heterogenous distribution of radiopharmaceutical throughout the thyroid, which is increased in size.  IMPRESSION: Mildly heterogenous iodine uptake, cannot exclude toxic multinodular goiter versus possible mild Graves disease. Consider ultrasound for further correlation.  Patient was on methimazole before, however, this was stopped after  TSH returned very elevated, in the 90s (02/2015) >> TSH decreased to 8, then normalized >> we continued her off MMI.   However,she was started on MMI 10 mg 2x a day (re-Rx by Dr. Rosanna Randy: 04/17/2016, [facility]) - I do not think he was aware that we had stopped MMI.  We stopped methimazole again in 05/2016.  However, we had to restart again methimazole in 05/2017.  She was on 5 mg daily, but this was stopped during the hospitalization for COPD and CHF exacerbation 02/2018.  In 12/2018 we had to restart methimazole.  She is taking 5 mg daily.  Reviewed her TFTs: Lab  Results  Component Value Date   TSH 2.99 06/13/2019   TSH 2.09 03/06/2019   TSH 0.15 (L) 12/26/2018   TSH 0.37 07/29/2018   TSH 0.45 04/05/2018   TSH 0.757 11/14/2017   TSH 1.61 09/27/2017   TSH 0.03 (L) 05/24/2017   TSH 0.54 08/20/2016   TSH 2.840 01/14/2016   FREET4 0.66 06/13/2019   FREET4 0.75 03/06/2019   FREET4 0.90 12/26/2018   FREET4 0.81 07/29/2018   FREET4 0.79 04/05/2018   FREET4 0.71 09/27/2017   FREET4 0.92 05/24/2017   FREET4 1.10 08/20/2016   FREET4 0.47 (L) 12/10/2015   FREET4 0.32 (L) 11/07/2015   Previously:   Thyroid U/S (05/12/2016): Large isthmic nodule Thyroid tissue is diffusely heterogeneous and enlarged.  The isthmus has a large isoechoic nodular structure. Unclear if this represents a discrete nodule versus focal heterogeneous thyroid tissue. This area measures up to 3.3 cm and recommend biopsy of this area.  Numerous small nodules and cysts throughout the left and right thyroid lobes. These predominantly cystic nodules do not meet criteria for biopsy or dedicated follow-up.  FNA of the isthmic nodule (06/25/2016): Benign  Pt denies: - feeling nodules in neck - hoarseness - dysphagia - choking - SOB with lying down  Pt does have a FH of thyroid ds.: aunt. No FH of thyroid cancer. No h/o radiation tx to head or neck.  No herbal supplements. No Biotin use. No recent steroids use.   DM2: She has a history of noncompliance with medications. She had several complications: History of stroke, also CKD, DR.  Reviewed HbA1c levels: Lab Results  Component Value Date  HGBA1C 9.3 (A) 06/13/2019   HGBA1C 7.2 (A) 12/26/2018   HGBA1C 7.6 (A) 07/29/2018   She was on: - Lantus 45 units 2x a day >> was off insulin - "the pharmacy did not receive it" - Januvia 50 mg in am >> stopped (?) - Glipizide 10 mg 2x a day >> started in the hospital We added Jardiance at last visit >> not taking  - "the pharmacy did not receive it" She was on Metformin  >> diarrhea.  Now on: - Trulicity 1.5 mg weekly >> Ozempic 0.5 mg weekly >> off for >1 mo >> Ozempic 0.5 mg weekly restarted 07/2019 - Lantus 50 >> 30 units in a.m. and 30 units at bedtime - NovoLog 16 units before breakfast and 16-20 units before dinner (take this 15 min before meals) We had to start Jardiance 10 mg 02/2018 due to decreased kidney function.  She is checking sugars twice a day - meter appears defective...: - am:  91-152, 165, 183 >> 130-333>> 78-176, 186, 194 >> 75-HI - 2h after b'fast: n/c - lunch: n/c - 2h after lunch: n/c >> 178 >> 121 - dinner: n/c >> 141 >> n/c >> 148, 282 >> 110-146 >> n/c - 2h after dinner: 119, 122 >> n/c >> 82-166, 186 >> n/c - bedtime:  97-102 >>  227-453 >> 136-209 >> n/c - nighttime: 139-197, 211, 217 >> see above >> 122-22, 320, 363 >> 134-HI Lowest: Highest: 381  + CKD; latest BUN/Cr: Lab Results  Component Value Date   BUN 30 (H) 04/25/2019   Lab Results  Component Value Date   CREATININE 2.65 (H) 04/25/2019  GFR 24 (02/18/2018)  + HL; latest lipid panel: Lab Results  Component Value Date   CHOL 135 07/27/2019   HDL 46.30 07/27/2019   LDLCALC 67 07/27/2019   TRIG 109.0 07/27/2019   CHOLHDL 3 07/27/2019  On Lipitor 40.  Latest dilated eye exam was in 01/2018: + DR.  ROS: Constitutional: no weight gain/no weight loss, no fatigue, no subjective hyperthermia, no subjective hypothermia Eyes: no blurry vision, no xerophthalmia ENT: no sore throat, + please see HPI Cardiovascular: no CP/no SOB/no palpitations/+ leg swelling Respiratory: + cough/no SOB/no wheezing Gastrointestinal: no N/no V/no D/no C/no acid reflux Musculoskeletal: no muscle aches/no joint aches Skin: no rashes, no hair loss Neurological: no tremors/no numbness/no tingling/no dizziness  I reviewed pt's medications, allergies, PMH, social hx, family hx, and changes were documented in the history of present illness. Otherwise, unchanged from my initial visit  note.  Past Medical History:  Diagnosis Date   Arthritis    hands, all over   Asthma    CHF (congestive heart failure) (HCC)    COPD (chronic obstructive pulmonary disease) (HCC)    on 2L home o2   Diabetes mellitus without complication (HCC)    type 2   Dyspnea    Hyperlipidemia    Hypertension    Sleep apnea    CPAP   Stroke [facility])    Was told at recent hospital visit she has stroke in past   Thyroid disease    Thyroid nodule 04/30/2016   Past Surgical History:  Procedure Laterality Date   arm surgery Left    COLONOSCOPY WITH PROPOFOL N/A 02/14/2019   Procedure: COLONOSCOPY WITH PROPOFOL;  Surgeon: [name], MD;  Location: [facility] ENDOSCOPY;  Service: Endoscopy;  Laterality: N/A;   Social History   Social History   Marital Status: Single    Spouse Name: N/A   Number  of Children: 1   Occupational History   None   Social History Main Topics   Smoking status: Current Every Day Smoker -- 1.00 packs/day   Smokeless tobacco: Not on file   Alcohol Use: No   Drug Use: No   Current Outpatient Medications on File Prior to Visit  Medication Sig Dispense Refill   Accu-Chek Softclix Lancets lancets Use as instructed to check blood sugar 3 times a day. E11.59 300 each 12   Acetylcysteine (NAC) 600 MG CAPS Take by mouth daily.     albuterol (PROVENTIL HFA;VENTOLIN HFA) 108 (90 Base) MCG/ACT inhaler Inhale 1-2 puffs into the lungs every 6 (six) hours as needed for wheezing or shortness of breath. 1 Inhaler 11   albuterol (PROVENTIL) (2.5 MG/3ML) 0.083% nebulizer solution Take 3 mLs (2.5 mg total) by nebulization every 6 (six) hours as needed for wheezing or shortness of breath. 100 vial 0   amLODipine (NORVASC) 10 MG tablet Take 10 mg by mouth daily. am     Armodafinil (NUVIGIL) 150 MG tablet Take 150 mg by mouth daily.     aspirin EC 81 MG tablet Take 81 mg by mouth daily.     atorvastatin (LIPITOR) 40 MG tablet TAKE ONE TABLET BY MOUTH EVERY DAY  (Patient taking differently: Take 40 mg by mouth at bedtime. ) 90 tablet PRN   BD PEN NEEDLE MICRO U/F 32G X 6 MM MISC SMARTSIG:Pre-Filled Pen Syringe Injection 3 Times Daily     Blood Glucose Monitoring Suppl (ACCU-CHEK GUIDE) w/Device KIT Use as instructed to check blood sugar 3 times a day. E11.59 1 kit 0   budesonide-formoterol (SYMBICORT) 80-4.5 MCG/ACT inhaler Inhale 2 puffs into the lungs 2 (two) times daily. 1 Inhaler 12   carvedilol (COREG) 25 MG tablet Take 1 tablet by mouth twice daily 180 tablet 3   ergocalciferol (VITAMIN D2) 1.25 MG (50000 UT) capsule Take by mouth.     esomeprazole (NEXIUM) 40 MG capsule Take 40 mg by mouth daily. am     glucose blood (ACCU-CHEK GUIDE) test strip Use as instructed to check blood sugar 3 times a day. E11.59 300 each 12   insulin glargine (LANTUS SOLOSTAR) 100 UNIT/ML Solostar Pen INJECT 50 UNITS SUBCUTANEOUSLY IN THE MORNING AND 30 UNITS AT NIGHT 72 mL 2   ipratropium (ATROVENT) 0.06 % nasal spray Place 2 sprays into both nostrils 4 (four) times daily.     methimazole (TAPAZOLE) 5 MG tablet Take 1 tablet by mouth once daily 90 tablet 0   metolazone (ZAROXOLYN) 5 MG tablet Take 5 mg by mouth daily.     NOVOLOG FLEXPEN 100 UNIT/ML FlexPen INJECT 16-20 UNITS SUBCUTANEOUSLY 2x TIMES DAILY before meals 15 mL 5   Semaglutide,0.25 or 0.5MG/DOS, (OZEMPIC, 0.25 OR 0.5 MG/DOSE,) 2 MG/1.5ML SOPN Inject 0.375 mLs (0.5 mg total) into the skin once a week. 3 pen 3   tiotropium (SPIRIVA) 18 MCG inhalation capsule Place 18 mcg into inhaler and inhale daily.     No current facility-administered medications on file prior to visit.   No Known Allergies Family History  Problem Relation Age of Onset   Diabetes Mother    Hypertension Mother    Diabetes Father    Hypertension Father    CAD Father    Diabetes Sister    PE: BP 124/76 (BP Location: Left Arm, Patient Position: Sitting, Cuff Size: Large)    Pulse 76    Ht 5' 4" (1.626 m)    Wt 217  lb  6.4 oz (98.6 kg)    SpO2 91%    BMI 37.32 kg/m  Body mass index is 37.32 kg/m.  Wt Readings from Last 3 Encounters:  10/03/19 217 lb 6.4 oz (98.6 kg)  07/31/19 (!) 216 lb (98 kg)  07/27/19 (!) 218 lb (98.9 kg)   Constitutional: overweight, in NAD Eyes: PERRLA, EOMI, no exophthalmos ENT: moist mucous membranes, + L>R thyromegaly, no cervical lymphadenopathy Cardiovascular: RRR, No MRG, +L>R LE edema-wears compression about this Respiratory: CTA B Gastrointestinal: abdomen soft, NT, ND, BS+ Musculoskeletal: no deformities, strength intact in all 4 Skin: moist, warm, no rashes Neurological: no tremor with outstretched hands, DTR normal in all 4  ASSESSMENT: 1. Thyrotoxicosis  2. Large goiter US SOFT TISSUE HEAD AND NECK  Order: [id number]  Status:  Final result Visible to patient:  No (Not Released) Dx:  Goiter diffuse  Details   Reading Physician Reading Date Result Priority  Markus Daft, MD 05/12/2016   Narrative    CLINICAL DATA: Thyromegaly on previous chest CT.  EXAM: THYROID ULTRASOUND  TECHNIQUE: Ultrasound examination of the thyroid gland and adjacent soft tissues was performed.  COMPARISON: Chest CT 01/14/2016  FINDINGS: Parenchymal Echotexture: Moderately heterogenous  Isthmus: 1.2 cm in the AP dimension  Right lobe: 8.4 x 2.8 x 4.5 cm  Left lobe: 8.2 x 3.0 x 3.7 cm  _________________________________________________________  Estimated total number of nodules >/= 1 cm: 6-10  Number of spongiform nodules >/= 2 cm not described below (TR1): 0  Number of mixed cystic and solid nodules >/= 1.5 cm not described below (Rector): 0  _________________________________________________________  Nodule # 1:  Location: Isthmus; Inferior  Maximum size: 3.3 cm; Other 2 dimensions: 1.7 x 2.7 cm  Composition: solid/almost completely solid (2)  Echogenicity: isoechoic (1)  **Given size (>/= 2.5 cm) and appearance, fine needle aspiration of this mildly  suspicious nodule should be considered based on TI-RADS criteria.  _________________________________________________________  Mildly complex cystic structure in the superior right thyroid lobe measuring up to 1.0 cm. Minimally complex cyst in the superior right thyroid lobe measuring up to 1.6 cm. Adjacent complex cysts or spongiform nodules in the mid right thyroid lobe, largest measuring 1.1 cm. Spongiform nodule in the mid left thyroid lobe measuring up to 1.3 cm.  Nodule # 2:  Location: Left; Mid  Maximum size: 2.0 cm; Other 2 dimensions: 0.8 x 0.8 cm  Composition: mixed cystic and solid (1)  Echogenicity: cannot determine (1)  This nodule does NOT meet TI-RADS criteria for biopsy or dedicated follow-up.  _________________________________________________________  IMPRESSION: Thyroid tissue is diffusely heterogeneous and enlarged.  The isthmus has a large isoechoic nodular structure. Unclear if this represents a discrete nodule versus focal heterogeneous thyroid tissue. This area measures up to 3.3 cm and recommend biopsy of this area.  Numerous small nodules and cysts throughout the left and right thyroid lobes. These predominantly cystic nodules do not meet criteria for biopsy or dedicated follow-up.  The above is in keeping with the ACR TI-RADS recommendations - J Am Coll Radiol 2017;14:587-595.   Electronically Signed By: Markus Daft M.D. On: 05/12/2016 12:03        Addendum: Adequacy Reason Satisfactory For Evaluation. Diagnosis THYROID, FINE NEEDLE ASPIRATION ISTHMUS INFERIOR (SPECIMEN 1 OF 1, COLLECTED ON 06/25/2016): CONSISTENT WITH BENIGN FOLLICULAR NODULE (BETHESDA CATEGORY II). Casimer Lanius MD Pathologist, Electronic Signature (Case signed 06/26/2016) Specimen Clinical Information Nodule 1 Isthmus Inferior 3.3 cm; Other 2 dimensions: 1.7 x 2.7 cm, Solid / almost completely solid, Isoechoic, ACR  TI-RADS total points: 3, Midly suspicious  nodule Source Thyroid, Fine Needle Aspiration, Isthmus Inferior, (Specimen 1 of 1, colected on 06/25/16 )  3. DM2, insulin dependent, uncontrolled  PLAN:  1. And 2.  -Patient with history of thyrotoxicosis, either due to mild Graves' disease or mild toxic multinodular goiter per review of her thyroid uptake and scan report -She continues on 5 mg of methimazole daily, with good control -Latest TFTs were normal 06/2019 -We will recheck the tests now  2. Large goiter -She has minimal neck compression symptoms, not bothersome -Reviewed the report of her latest thyroid ultrasound and biopsy.  The dominant nodule measured 3.3 cm and had a benign biopsy. -We will need to check another ultrasound -ordered today  3. DM2 -Patient with longstanding, persistent, type 2 diabetes, on a basal-bolus insulin regimen and weekly GLP-1 receptor agonist, restarted at last visit.  She was off this for 1 month before last visit despite the medication being approved by her insurance.  Sugars started to worsen and this was unclear if due only to missing Ozempic or also insulin degradation since her NovoLog bilateral was warm to touch.  At that time, I advised him to change the pens and keep them cool.  We did not change medication doses.  Her most recent HbA1c was 9.3% in 06/2019.  At last visit sugars appear to have improved throughout the day but they were still some high blood sugars at night, even in the 300s.  We reduced the Lantus in the morning as we started back Ozempic. -At this visit, sugars appear improved, but in the last 2 weeks, she had a lot of HI readings, which I believe are not accurate.  At today's visit, her HbA1c: 7.4% (much better).  Therefore, unfortunately, we cannot analyze her blood sugars for trends.  Since HbA1c is still above target, and she is tolerating Ozempic well.  I advised her to increase the dose to 1 mg weekly.  For now, we will continue the same doses of NovoLog and Lantus, but I am  hoping we can reduce the doses at next visit, after the holidays. -We checked her blood sugar with her meter and compared to her meter: and we obtained 145, while she obtained 161 -I will also check a fructosamine level to verify the HbA1c - I advised her to: Patient Instructions  Please continue: - Lantus 30 units in a.m. and 30 units at bedtime - NovoLog 16 units before breakfast and 16-20 units before dinner   Please increase: - Ozempic 1 mg weekly  Continue: - Methimazole 5 mg daily.  Please stop at the lab.  Please return in 3 months with your sugar log.   - advised to check sugars at different times of the day - 3x a day, rotating check times - advised for yearly eye exams >> she is not UTD - return to clinic in 3 months  CC: PCP: Gennette Pac, [place] - [phone]  Office Visit on 10/03/2019  Component Date Value Ref Range Status   TSH 10/03/2019 4.37  0.35 - 4.50 uIU/mL Final   Free T4 10/03/2019 0.57* 0.60 - 1.60 ng/dL Final   Comment: Specimens from patients who are undergoing biotin therapy and /or ingesting biotin supplements may contain high levels of biotin.  The higher biotin concentration in these specimens interferes with this Free T4 assay.  Specimens that contain high levels  of biotin may cause false high results for this Free T4 assay.  Please interpret results  in light of the total clinical presentation of the patient.     T3, Free 10/03/2019 3.7  2.3 - 4.2 pg/mL Final   TSH is slightly high and free T4 low.  We will decrease the methimazole to 2.5 mg daily and recheck her tests in 1.5 months.  Component     Latest Ref Rng & Units 10/03/2019  Fructosamine     205 - 285 umol/L 222  HbA1c calc. From fructosamine is 5.4%.  Philemon Kingdom, MD PhD [facility]

## 2019-10-05 ENCOUNTER — Other Ambulatory Visit: Payer: Self-pay | Admitting: Internal Medicine

## 2019-10-05 DIAGNOSIS — E041 Nontoxic single thyroid nodule: Secondary | ICD-10-CM

## 2019-10-05 MED ORDER — METHIMAZOLE 5 MG PO TABS
2.5000 mg | ORAL_TABLET | Freq: Every day | ORAL | 1 refills | Status: DC
Start: 2019-10-05 — End: 2020-05-08

## 2019-10-06 LAB — FRUCTOSAMINE: Fructosamine: 222 umol/L (ref 205–285)

## 2019-10-11 NOTE — Progress Notes (Signed)
Result letter sent.

## 2019-12-12 ENCOUNTER — Ambulatory Visit (INDEPENDENT_AMBULATORY_CARE_PROVIDER_SITE_OTHER): Payer: Medicaid Other | Admitting: Internal Medicine

## 2019-12-12 ENCOUNTER — Encounter: Payer: Self-pay | Admitting: Internal Medicine

## 2019-12-12 ENCOUNTER — Other Ambulatory Visit: Payer: Self-pay

## 2019-12-12 VITALS — BP 130/82 | HR 73 | Ht 64.0 in | Wt 213.2 lb

## 2019-12-12 DIAGNOSIS — E041 Nontoxic single thyroid nodule: Secondary | ICD-10-CM | POA: Diagnosis not present

## 2019-12-12 DIAGNOSIS — E0590 Thyrotoxicosis, unspecified without thyrotoxic crisis or storm: Secondary | ICD-10-CM

## 2019-12-12 DIAGNOSIS — E1159 Type 2 diabetes mellitus with other circulatory complications: Secondary | ICD-10-CM

## 2019-12-12 DIAGNOSIS — E1165 Type 2 diabetes mellitus with hyperglycemia: Secondary | ICD-10-CM

## 2019-12-12 LAB — POCT GLYCOSYLATED HEMOGLOBIN (HGB A1C): Hemoglobin A1C: 7.1 % — AB (ref 4.0–5.6)

## 2019-12-12 LAB — TSH: TSH: 3.8 u[IU]/mL (ref 0.35–4.50)

## 2019-12-12 LAB — T3, FREE: T3, Free: 3.3 pg/mL (ref 2.3–4.2)

## 2019-12-12 LAB — T4, FREE: Free T4: 0.7 ng/dL (ref 0.60–1.60)

## 2019-12-12 NOTE — Patient Instructions (Signed)
Please continue: - Lantus 30 units in a.m. and 30 units at bedtime - NovoLog 16 units before breakfast and 16-20 units before dinner  - Ozempic 1 mg weekly  Continue: - Methimazole 5 mg daily.  Please stop at the lab.  Please return in 3 months with your sugar log.

## 2019-12-12 NOTE — Progress Notes (Signed)
Patient ID: Emily [name], female   DOB: 1960-01-08, 59 y.o.   MRN: [id number]  This visit occurred during the SARS-CoV-2 public health emergency.  Safety protocols were in place, including screening questions prior to the visit, additional usage of staff PPE, and extensive cleaning of exam room while observing appropriate contact time as indicated for disinfecting solutions.   HPI  Emily [name] is a 59 y.o.-year-old female, returning for f/u for thyrotoxicosis/goiter and DM2, uncontrolled, insulin-dependent, with complications (CKD stage 3, cerebro-vascular ds. - s/p stroke). Last visit 2 months ago.  TMNG/Graves ds.: Reviewed and addended history: Pt was dx'ed with hyperthyroidism in ~2014 >> started MMI, lately 10 mg 2x daily. She was lost for f/u at [facility] as she could not travel over there.  Thyroid uptake and scan (06/22/2013) showed: FINDINGS: The 24 hour uptake was 24.7%. The normal reference range at 24 hours is 15 to 35%.   The thyroid scan demonstrates symmetric, mildly heterogenous distribution of radiopharmaceutical throughout the thyroid, which is increased in size.  IMPRESSION: Mildly heterogenous iodine uptake, cannot exclude toxic multinodular goiter versus possible mild Graves disease. Consider ultrasound for further correlation.  Patient was on methimazole before, however, this was stopped after  TSH returned very elevated, in the 90s (02/2015) >> TSH decreased to 8, then normalized >> we continued her off MMI.   However,she was started on MMI 10 mg 2x a day (re-Rx by Dr. Rosanna Randy: 04/17/2016, [facility]) - I do not think he was aware that we had stopped MMI.  We stopped methimazole again in 05/2016.  However, we had to restart again methimazole in 05/2017.  She was on 5 mg daily, but this was stopped during the hospitalization for COPD and CHF exacerbation 02/2018.  In 12/2018 we had to restart methimazole 5 mg daily.  In 09/2019, we called her to decrease  the dose to 2.5 mg daily, but she did not get the msg >> still on 5 mg daily.  Reviewed her TFTs: Lab Results  Component Value Date   TSH 4.37 10/03/2019   TSH 2.99 06/13/2019   TSH 2.09 03/06/2019   TSH 0.15 (L) 12/26/2018   TSH 0.37 07/29/2018   TSH 0.45 04/05/2018   TSH 0.757 11/14/2017   TSH 1.61 09/27/2017   TSH 0.03 (L) 05/24/2017   TSH 0.54 08/20/2016   FREET4 0.57 (L) 10/03/2019   FREET4 0.66 06/13/2019   FREET4 0.75 03/06/2019   FREET4 0.90 12/26/2018   FREET4 0.81 07/29/2018   FREET4 0.79 04/05/2018   FREET4 0.71 09/27/2017   FREET4 0.92 05/24/2017   FREET4 1.10 08/20/2016   FREET4 0.47 (L) 12/10/2015   Previously:   Thyroid U/S (05/12/2016): Large isthmic nodule Thyroid tissue is diffusely heterogeneous and enlarged.  The isthmus has a large isoechoic nodular structure. Unclear if this represents a discrete nodule versus focal heterogeneous thyroid tissue. This area measures up to 3.3 cm and recommend biopsy of this area.  Numerous small nodules and cysts throughout the left and right thyroid lobes. These predominantly cystic nodules do not meet criteria for biopsy or dedicated follow-up.  FNA of the isthmic nodule (06/25/2016): Benign  At last visit, I ordered another thyroid ultrasound but she did not have this yet...  Pt denies: - feeling nodules in neck - hoarseness - dysphagia - choking - SOB with lying down  Pt does have a FH of thyroid ds.: aunt. No FH of thyroid cancer. No h/o radiation tx to head or neck.  No herbal supplements.  No Biotin use. No recent steroids use.   DM2: She has a history of noncompliance with medications. She had several complications: History of stroke, also CKD, DR.  Reviewed HbA1c levels: 10/03/2019: HbA1c 7.4% but HbA1c calculated from fructosamine is 5.4%. Lab Results  Component Value Date   HGBA1C 9.3 (A) 06/13/2019   HGBA1C 7.2 (A) 12/26/2018   HGBA1C 7.6 (A) 07/29/2018   She was on: - Lantus 45 units  2x a day >> was off insulin - "the pharmacy did not receive it" - Januvia 50 mg in am >> stopped (?) - Glipizide 10 mg 2x a day >> started in the hospital We added Jardiance at last visit >> not taking  - "the pharmacy did not receive it" She was on Metformin >> diarrhea.  Now on: - Trulicity 1.5 mg weekly >> Ozempic 0.5 mg weekly >> off for >1 mo >> Ozempic 0.5 mg weekly restarted 07/2019 >> 1 mg weekly - Lantus 50 >> 30 units in a.m. and 30 units at bedtime - NovoLog 16 units before breakfast and 20 units before dinner (take this 15 min before meals) We had to start Jardiance 10 mg 02/2018 due to decreased kidney function.  She is checking sugars twice a day: - am: 130-333>> 78-176, 186, 194 >> 75-HI >> 82-181 (last 2 weeks; 82-143) - 2h after b'fast: n/c - lunch: n/c - 2h after lunch: n/c >> 178 >> 121 - dinner: n/c >> 148, 282 >> 110-146 >> n/c >> 83-136 - 2h after dinner: n/c >> 82-166, 186 >> n/c >> 95-137, 226 - bedtime:  97-102 >>  227-453 >> 136-209 >> n/c >> 99-181, 207 - nighttime: 122-22, 320, 363 >> 134-HI >> 79, 104-218, 223 Lowest: 82 Highest: 381 >> 226  + CKD; latest BUN/Cr: Lab Results  Component Value Date   BUN 30 (H) 04/25/2019   Lab Results  Component Value Date   CREATININE 2.65 (H) 04/25/2019  GFR 24 (02/18/2018)  + HL; latest lipid panel: Lab Results  Component Value Date   CHOL 135 07/27/2019   HDL 46.30 07/27/2019   LDLCALC 67 07/27/2019   TRIG 109.0 07/27/2019   CHOLHDL 3 07/27/2019  On Lipitor 40.  Latest dilated eye exam was in 01/2018: + DR  ROS: Constitutional: no weight gain/no weight loss, no fatigue, no subjective hyperthermia, no subjective hypothermia Eyes: no blurry vision, no xerophthalmia ENT: no sore throat, + see HPI Cardiovascular: no CP/no SOB/no palpitations/no leg swelling Respiratory: no cough/no SOB/no wheezing Gastrointestinal: no N/no V/no D/no C/no acid reflux Musculoskeletal: no muscle aches/no joint aches Skin:  no rashes, no hair loss Neurological: no tremors/no numbness/no tingling/no dizziness  I reviewed pt's medications, allergies, PMH, social hx, family hx, and changes were documented in the history of present illness. Otherwise, unchanged from my initial visit note.  Past Medical History:  Diagnosis Date  . Arthritis    hands, all over  . Asthma   . CHF (congestive heart failure) ([place])   . COPD (chronic obstructive pulmonary disease) ([facility])    on 2L home o2  . Diabetes mellitus without complication ([place])    type 2  . Dyspnea   . Hyperlipidemia   . Hypertension   . Sleep apnea    CPAP  . Stroke [facility])    Was told at recent hospital visit she has stroke in past  . Thyroid disease   . Thyroid nodule 04/30/2016   Past Surgical History:  Procedure Laterality Date  . arm surgery Left   .  COLONOSCOPY WITH PROPOFOL N/A 02/14/2019   Procedure: COLONOSCOPY WITH PROPOFOL;  Surgeon: [name], MD;  Location: [facility] ENDOSCOPY;  Service: Endoscopy;  Laterality: N/A;   Social History   Social History  . Marital Status: Single    Spouse Name: N/A  . Number of Children: 1   Occupational History  . None   Social History Main Topics  . Smoking status: Current Every Day Smoker -- 1.00 packs/day  . Smokeless tobacco: Not on file  . Alcohol Use: No  . Drug Use: No   Current Outpatient Medications on File Prior to Visit  Medication Sig Dispense Refill  . Accu-Chek Softclix Lancets lancets Use as instructed to check blood sugar 3 times a day. E11.59 300 each 12  . Acetylcysteine (NAC) 600 MG CAPS Take by mouth daily.    Marland Kitchen albuterol (PROVENTIL HFA;VENTOLIN HFA) 108 (90 Base) MCG/ACT inhaler Inhale 1-2 puffs into the lungs every 6 (six) hours as needed for wheezing or shortness of breath. 1 Inhaler 11  . albuterol (PROVENTIL) (2.5 MG/3ML) 0.083% nebulizer solution Take 3 mLs (2.5 mg total) by nebulization every 6 (six) hours as needed for wheezing or shortness of breath. 100 vial 0  .  amLODipine (NORVASC) 10 MG tablet Take 10 mg by mouth daily. am    . Armodafinil (NUVIGIL) 150 MG tablet Take 150 mg by mouth daily.    Marland Kitchen aspirin EC 81 MG tablet Take 81 mg by mouth daily.    Marland Kitchen atorvastatin (LIPITOR) 40 MG tablet TAKE ONE TABLET BY MOUTH EVERY DAY (Patient taking differently: Take 40 mg by mouth at bedtime. ) 90 tablet PRN  . BD PEN NEEDLE MICRO U/F 32G X 6 MM MISC SMARTSIG:Pre-Filled Pen Syringe Injection 3 Times Daily    . Blood Glucose Monitoring Suppl (ACCU-CHEK GUIDE) w/Device KIT Use as instructed to check blood sugar 3 times a day. E11.59 1 kit 0  . budesonide-formoterol (SYMBICORT) 80-4.5 MCG/ACT inhaler Inhale 2 puffs into the lungs 2 (two) times daily. 1 Inhaler 12  . carvedilol (COREG) 25 MG tablet Take 1 tablet by mouth twice daily 180 tablet 3  . ergocalciferol (VITAMIN D2) 1.25 MG (50000 UT) capsule Take by mouth.    . esomeprazole (NEXIUM) 40 MG capsule Take 40 mg by mouth daily. am    . glucose blood (ACCU-CHEK GUIDE) test strip Use as instructed to check blood sugar 3 times a day. E11.59 300 each 12  . insulin glargine (LANTUS SOLOSTAR) 100 UNIT/ML Solostar Pen INJECT 50 UNITS SUBCUTANEOUSLY IN THE MORNING AND 30 UNITS AT NIGHT 72 mL 2  . ipratropium (ATROVENT) 0.06 % nasal spray Place 2 sprays into both nostrils 4 (four) times daily.    . methimazole (TAPAZOLE) 5 MG tablet Take 0.5 tablets (2.5 mg total) by mouth daily. 90 tablet 1  . metolazone (ZAROXOLYN) 5 MG tablet Take 5 mg by mouth daily.    Marland Kitchen NOVOLOG FLEXPEN 100 UNIT/ML FlexPen INJECT 16-20 UNITS SUBCUTANEOUSLY 2x TIMES DAILY before meals 15 mL 5  . Semaglutide, 1 MG/DOSE, (OZEMPIC, 1 MG/DOSE,) 4 MG/3ML SOPN Inject 1 mg into the skin once a week. 9 mL 3  . tiotropium (SPIRIVA) 18 MCG inhalation capsule Place 18 mcg into inhaler and inhale daily.     No current facility-administered medications on file prior to visit.   No Known Allergies Family History  Problem Relation Age of Onset  . Diabetes Mother    . Hypertension Mother   . Diabetes Father   . Hypertension Father   .  CAD Father   . Diabetes Sister    PE: BP 130/82   Pulse 73   Ht 5' 4" (1.626 m)   Wt 213 lb 3.2 oz (96.7 kg)   SpO2 (!) 88%   BMI 36.60 kg/m  Body mass index is 36.6 kg/m.  Wt Readings from Last 3 Encounters:  12/12/19 213 lb 3.2 oz (96.7 kg)  10/03/19 217 lb 6.4 oz (98.6 kg)  07/31/19 (!) 216 lb (98 kg)   Constitutional: overweight, in NAD Eyes: PERRLA, EOMI, no exophthalmos ENT: moist mucous membranes, L>R thyromegaly, no cervical lymphadenopathy Cardiovascular: RRR, No MRG, L>R LE edema-wears compression hoses Respiratory: CTA B Gastrointestinal: abdomen soft, NT, ND, BS+ Musculoskeletal: no deformities, strength intact in all 4 Skin: moist, warm, no rashes Neurological: no tremor with outstretched hands, DTR normal in all 4  ASSESSMENT: 1. Thyrotoxicosis  2. Large goiter US SOFT TISSUE HEAD AND NECK  Order: [id number]  Status:  Final result Visible to patient:  No (Not Released) Dx:  Goiter diffuse  Details   Reading Physician Reading Date Result Priority  Markus Daft, MD 05/12/2016   Narrative    CLINICAL DATA: Thyromegaly on previous chest CT.  EXAM: THYROID ULTRASOUND  TECHNIQUE: Ultrasound examination of the thyroid gland and adjacent soft tissues was performed.  COMPARISON: Chest CT 01/14/2016  FINDINGS: Parenchymal Echotexture: Moderately heterogenous  Isthmus: 1.2 cm in the AP dimension  Right lobe: 8.4 x 2.8 x 4.5 cm  Left lobe: 8.2 x 3.0 x 3.7 cm  _________________________________________________________  Estimated total number of nodules >/= 1 cm: 6-10  Number of spongiform nodules >/= 2 cm not described below (TR1): 0  Number of mixed cystic and solid nodules >/= 1.5 cm not described below ([place]): 0  _________________________________________________________  Nodule # 1:  Location: Isthmus; Inferior  Maximum size: 3.3 cm; Other 2 dimensions: 1.7 x 2.7  cm  Composition: solid/almost completely solid (2)  Echogenicity: isoechoic (1)  **Given size (>/= 2.5 cm) and appearance, fine needle aspiration of this mildly suspicious nodule should be considered based on TI-RADS criteria.  _________________________________________________________  Mildly complex cystic structure in the superior right thyroid lobe measuring up to 1.0 cm. Minimally complex cyst in the superior right thyroid lobe measuring up to 1.6 cm. Adjacent complex cysts or spongiform nodules in the mid right thyroid lobe, largest measuring 1.1 cm. Spongiform nodule in the mid left thyroid lobe measuring up to 1.3 cm.  Nodule # 2:  Location: Left; Mid  Maximum size: 2.0 cm; Other 2 dimensions: 0.8 x 0.8 cm  Composition: mixed cystic and solid (1)  Echogenicity: cannot determine (1)  This nodule does NOT meet TI-RADS criteria for biopsy or dedicated follow-up.  _________________________________________________________  IMPRESSION: Thyroid tissue is diffusely heterogeneous and enlarged.  The isthmus has a large isoechoic nodular structure. Unclear if this represents a discrete nodule versus focal heterogeneous thyroid tissue. This area measures up to 3.3 cm and recommend biopsy of this area.  Numerous small nodules and cysts throughout the left and right thyroid lobes. These predominantly cystic nodules do not meet criteria for biopsy or dedicated follow-up.  The above is in keeping with the ACR TI-RADS recommendations - J Am Coll Radiol 2017;14:587-595.   Electronically Signed By: Markus Daft M.D. On: 05/12/2016 12:03        Addendum: Adequacy Reason Satisfactory For Evaluation. Diagnosis THYROID, FINE NEEDLE ASPIRATION ISTHMUS INFERIOR (SPECIMEN 1 OF 1, COLLECTED ON 06/25/2016): CONSISTENT WITH BENIGN FOLLICULAR NODULE ([place] CATEGORY II). Casimer Lanius MD Pathologist, Electronic  Signature (Case signed 06/26/2016) Specimen Clinical  Information Nodule 1 Isthmus Inferior 3.3 cm; Other 2 dimensions: 1.7 x 2.7 cm, Solid / almost completely solid, Isoechoic, ACR TI-RADS total points: 3, Midly suspicious nodule Source Thyroid, Fine Needle Aspiration, Isthmus Inferior, (Specimen 1 of 1, colected on 06/25/16 )  3. DM2, insulin dependent, uncontrolled  PLAN:  1. And 2.  -Patient with history of thyrotoxicosis, either due to mild Graves' disease or mild toxic multinodular goiter per review of her thyroid uptake and scan report -She continues on low-dose methimazole, which she tolerates well -Latest TSH was at the upper limit of normal and free T4 was slightly low, so we called her to decrease the dose of methimazole at last visit to 2.5 mg daily.  However, she continues on 5 mg daily and she did not get the message... -We will recheck her TFTs today and will probably need to decrease the dose afterwards.  2. Large goiter -She continues to have minimal neck compression symptoms, but not bothersome -The dominant nodule measured 3.3 cm and we had a benign biopsy of this nodule in 2018 -At last visit I ordered another thyroid ultrasound but she did not have this done -We will try to repeat the ultrasound at next visit  3. DM2 -Patient with longstanding, fairly well-controlled type 2 diabetes, on a basal-bolus insulin regimen and weekly GLP-1 receptor agonist, dose increased at last visit. Sugars were worse in the past when she came off Ozempic. At last visit, sugars were improved, but in the previous 2 weeks she had higher readings, which we discussed that may not be accurate. HbA1c returned 7.4%, much improved, however, an HbA1c calculated from fructosamine was even lower, at 5.4%. We did not change her insulin regimen at that time. -At today's visit, we reviewed her sugars since last visit.  They started to improve almost immediately after increasing the dose of Ozempic and they are now after close to goal with few higher blood  sugars, but due to the significant improvement, I suggested to continue on the same regimen. - I advised her to: Patient Instructions  Please continue: - Lantus 30 units in a.m. and 30 units at bedtime - NovoLog 16 units before breakfast and 16-20 units before dinner  - Ozempic 1 mg weekly  Continue: - Methimazole 5 mg daily.  Please stop at the lab.  Please return in 3 months with your sugar log.   - we checked her HbA1c: 7.1% (better) - advised to check sugars at different times of the day - 3x a day, rotating check times - advised for yearly eye exams >> she is not UTD -again advised to schedule - return to clinic in 3 months  CC: PCP: Gennette Pac, FNP - [phone]  Component     Latest Ref Rng & Units 12/12/2019  TSH     0.35 - 4.50 uIU/mL 3.80  T4,Free(Direct)     0.60 - 1.60 ng/dL 0.70  Triiodothyronine,Free,Serum     2.3 - 4.2 pg/mL 3.3  TFTs normal, but free T4 at the lower end of normal and TSH close to the upper limit of normal. We will decrease the dose of methimazole 2.5 mg daily and recheck the test in 5 to 6 weeks.  Philemon Kingdom, MD PhD [facility] Endocrinology

## 2019-12-13 ENCOUNTER — Encounter: Payer: Self-pay | Admitting: Internal Medicine

## 2019-12-18 ENCOUNTER — Other Ambulatory Visit: Payer: Self-pay

## 2019-12-18 ENCOUNTER — Ambulatory Visit (INDEPENDENT_AMBULATORY_CARE_PROVIDER_SITE_OTHER): Payer: Medicaid Other | Admitting: Vascular Surgery

## 2019-12-18 ENCOUNTER — Encounter (INDEPENDENT_AMBULATORY_CARE_PROVIDER_SITE_OTHER): Payer: Self-pay | Admitting: Vascular Surgery

## 2019-12-18 VITALS — BP 141/78 | HR 94 | Resp 16 | Wt 214.0 lb

## 2019-12-18 DIAGNOSIS — I872 Venous insufficiency (chronic) (peripheral): Secondary | ICD-10-CM

## 2019-12-18 DIAGNOSIS — E782 Mixed hyperlipidemia: Secondary | ICD-10-CM

## 2019-12-18 DIAGNOSIS — E1159 Type 2 diabetes mellitus with other circulatory complications: Secondary | ICD-10-CM

## 2019-12-18 DIAGNOSIS — I890 Lymphedema, not elsewhere classified: Secondary | ICD-10-CM | POA: Diagnosis not present

## 2019-12-18 DIAGNOSIS — I10 Essential (primary) hypertension: Secondary | ICD-10-CM

## 2019-12-18 NOTE — Progress Notes (Signed)
MRN : [id number]  Emily [name] is a 59 y.o. ([birth year]) female who presents with chief complaint of  Chief Complaint  Patient presents with  . Follow-up    85monthfollow up  .  History of Present Illness:   The patient returns to the office for followup evaluation regarding leg swelling.  The swelling has persisted and the pain associated with swelling continues. There have not been any interval development of a ulcerations or wounds.  Since the previous visit the patient has been wearing graduated compression stockings and has noted little if any improvement in the lymphedema. The patient has been using compression routinely morning until night.  The patient also states elevation during the day and exercise is being done too.  Current Meds  Medication Sig  . Accu-Chek Softclix Lancets lancets Use as instructed to check blood sugar 3 times a day. E11.59  . Acetylcysteine (NAC) 600 MG CAPS Take by mouth daily.  .Marland Kitchenalbuterol (PROVENTIL HFA;VENTOLIN HFA) 108 (90 Base) MCG/ACT inhaler Inhale 1-2 puffs into the lungs every 6 (six) hours as needed for wheezing or shortness of breath.  .Marland Kitchenalbuterol (PROVENTIL) (2.5 MG/3ML) 0.083% nebulizer solution Take 3 mLs (2.5 mg total) by nebulization every 6 (six) hours as needed for wheezing or shortness of breath.  .Marland KitchenamLODipine (NORVASC) 10 MG tablet Take 10 mg by mouth daily. am  . Armodafinil 150 MG tablet Take 150 mg by mouth daily.  .Marland Kitchenaspirin EC 81 MG tablet Take 81 mg by mouth daily.  .Marland Kitchenatorvastatin (LIPITOR) 40 MG tablet TAKE ONE TABLET BY MOUTH EVERY DAY (Patient taking differently: Take 40 mg by mouth at bedtime.)  . BD PEN NEEDLE MICRO U/F 32G X 6 MM MISC SMARTSIG:Pre-Filled Pen Syringe Injection 3 Times Daily  . Blood Glucose Monitoring Suppl (ACCU-CHEK GUIDE) w/Device KIT Use as instructed to check blood sugar 3 times a day. E11.59  . budesonide-formoterol (SYMBICORT) 80-4.5 MCG/ACT inhaler Inhale 2 puffs into the lungs 2 (two)  times daily.  . carvedilol (COREG) 25 MG tablet Take 1 tablet by mouth twice daily  . ergocalciferol (VITAMIN D2) 1.25 MG (50000 UT) capsule Take by mouth.  . esomeprazole (NEXIUM) 40 MG capsule Take 40 mg by mouth daily. am  . glucose blood (ACCU-CHEK GUIDE) test strip Use as instructed to check blood sugar 3 times a day. E11.59  . insulin glargine (LANTUS SOLOSTAR) 100 UNIT/ML Solostar Pen INJECT 50 UNITS SUBCUTANEOUSLY IN THE MORNING AND 30 UNITS AT NIGHT (Patient taking differently: INJECT 30 UNITS SUBCUTANEOUSLY IN THE MORNING AND 30 UNITS AT NIGHT)  . ipratropium (ATROVENT) 0.06 % nasal spray Place 2 sprays into both nostrils 4 (four) times daily.  . methimazole (TAPAZOLE) 5 MG tablet Take 0.5 tablets (2.5 mg total) by mouth daily.  . metolazone (ZAROXOLYN) 5 MG tablet Take 5 mg by mouth daily.  .Marland KitchenNOVOLOG FLEXPEN 100 UNIT/ML FlexPen INJECT 16-20 UNITS SUBCUTANEOUSLY 2x TIMES DAILY before meals  . potassium chloride (KLOR-CON) 10 MEQ tablet Take by mouth.  . Semaglutide, 1 MG/DOSE, (OZEMPIC, 1 MG/DOSE,) 4 MG/3ML SOPN Inject 1 mg into the skin once a week.  . tiotropium (SPIRIVA) 18 MCG inhalation capsule Place 18 mcg into inhaler and inhale daily.    Past Medical History:  Diagnosis Date  . Arthritis    hands, all over  . Asthma   . CHF ([place]estive heart failure) ([place]   . COPD (chronic obstructive pulmonary disease) (H[facility])    on 2L home o2  . Diabetes  mellitus without complication ([place])    type 2  . Dyspnea   . Hyperlipidemia   . Hypertension   . Sleep apnea    CPAP  . Stroke [facility])    Was told at recent hospital visit she has stroke in past  . Thyroid disease   . Thyroid nodule 04/30/2016    Past Surgical History:  Procedure Laterality Date  . arm surgery Left   . COLONOSCOPY WITH PROPOFOL N/A 02/14/2019   Procedure: COLONOSCOPY WITH PROPOFOL;  Surgeon: [name], MD;  Location: [facility] ENDOSCOPY;  Service: Endoscopy;  Laterality: N/A;    Social History Social History    Tobacco Use  . Smoking status: Former Smoker    Packs/day: 1.50    Years: 40.00    Pack years: 60.00    Types: Cigarettes    Quit date: 01/13/2016    Years since quitting: 3.9  . Smokeless tobacco: Never Used  Vaping Use  . Vaping Use: Never used  Substance Use Topics  . Alcohol use: No  . Drug use: No    Family History Family History  Problem Relation Age of Onset  . Diabetes Mother   . Hypertension Mother   . Diabetes Father   . Hypertension Father   . CAD Father   . Diabetes Sister     No Known Allergies   REVIEW OF SYSTEMS (Negative unless checked)  Constitutional: '[]' Weight loss  '[]' Fever  '[]' Chills Cardiac: '[]' Chest pain   '[]' Chest pressure   '[]' Palpitations   '[]' Shortness of breath when laying flat   '[]' Shortness of breath with exertion. Vascular:  '[]' Pain in legs with walking   '[x]' Pain in legs at rest  '[]' History of DVT   '[]' Phlebitis   '[x]' Swelling in legs   '[]' Varicose veins   '[]' Non-healing ulcers Pulmonary:   '[]' Uses home oxygen   '[]' Productive cough   '[]' Hemoptysis   '[]' Wheeze  '[]' COPD   '[]' Asthma Neurologic:  '[]' Dizziness   '[]' Seizures   '[]' History of stroke   '[]' History of TIA  '[]' Aphasia   '[]' Vissual changes   '[]' Weakness or numbness in arm   '[]' Weakness or numbness in leg Musculoskeletal:   '[]' Joint swelling   '[]' Joint pain   '[]' Low back pain Hematologic:  '[]' Easy bruising  '[]' Easy bleeding   '[]' Hypercoagulable state   '[]' Anemic Gastrointestinal:  '[]' Diarrhea   '[]' Vomiting  '[]' Gastroesophageal reflux/heartburn   '[]' Difficulty swallowing. Genitourinary:  '[]' Chronic kidney disease   '[]' Difficult urination  '[]' Frequent urination   '[]' Blood in urine Skin:  '[]' Rashes   '[]' Ulcers  Psychological:  '[]' History of anxiety   '[]'  History of major depression.  Physical Examination  Vitals:   12/18/19 1052  BP: (!) 141/78  Pulse: 94  Resp: 16  Weight: 214 lb (97.1 kg)   Body mass index is 36.73 kg/m. Gen: WD/WN, NAD Head: [place]/AT, No temporalis wasting.  Ear/Nose/Throat: Hearing grossly intact, nares  w/o erythema or drainage Eyes: PER, EOMI, sclera nonicteric.  Neck: Supple, no large masses.   Pulmonary:  Good air movement, no audible wheezing bilaterally, no use of accessory muscles.  Cardiac: RRR, no JVD Vascular: scattered varicosities present bilaterally.  Mild venous stasis changes to the legs bilaterally.  2+ soft pitting edema. Vessel Right Left  Radial Palpable Palpable  Gastrointestinal: Non-distended. No guarding/no peritoneal signs.  Musculoskeletal: M/S 5/5 throughout.  No deformity or atrophy.  Neurologic: CN 2-12 intact. Symmetrical.  Speech is fluent. Motor exam as listed above. Psychiatric: Judgment intact, Mood & affect appropriate for pt's clinical situation. Dermatologic: Venous rashes no ulcers noted.  No changes consistent with cellulitis. Lymph : Mild lichenification and skin changes of chronic lymphedema.  CBC Lab Results  Component Value Date   WBC 7.0 04/25/2019   HGB 11.8 (L) 04/25/2019   HCT 39.8 04/25/2019   MCV 92.1 04/25/2019   PLT 204 04/25/2019    BMET    Component Value Date/Time   NA 144 04/25/2019 1138   NA 142 05/14/2016 1852   NA 140 06/02/2013 1230   K 4.9 04/25/2019 1138   K 3.5 06/02/2013 1230   CL 110 04/25/2019 1138   CL 107 06/02/2013 1230   CO2 28 04/25/2019 1138   CO2 26 06/02/2013 1230   GLUCOSE 92 04/25/2019 1138   GLUCOSE 178 (H) 06/02/2013 1230   BUN 30 (H) 04/25/2019 1138   BUN 27 (H) 05/14/2016 1852   BUN 14 06/02/2013 1230   CREATININE 2.65 (H) 04/25/2019 1138   CREATININE 1.87 (H) 04/05/2018 1008   CALCIUM 8.5 (L) 04/25/2019 1138   CALCIUM 9.5 06/02/2013 1230   GFRNONAA 19 (L) 04/25/2019 1138   GFRNONAA 29 (L) 04/05/2018 1008   GFRAA 22 (L) 04/25/2019 1138   GFRAA 34 (L) 04/05/2018 1008   CrCl cannot be calculated (Patient's most recent lab result is older than the maximum 21 days allowed.).  COAG Lab Results  Component Value Date   INR 0.94 08/13/2016    Radiology No results  found.   Assessment/Plan 1. Lymphedema  No surgery or intervention at this point in time.    I have reviewed my discussion with the patient regarding lymphedema and why it  causes symptoms.  Patient will continue wearing graduated compression stockings class 1 (20-30 mmHg) on a daily basis a prescription was given. The patient is reminded to put the stockings on first thing in the morning and removing them in the evening. The patient is instructed specifically not to sleep in the stockings.   In addition, behavioral modification throughout the day will be continued.  This will include frequent elevation (such as in a recliner), use of over the counter pain medications as needed and exercise such as walking.  I have reviewed systemic causes for chronic edema such as liver, kidney and cardiac etiologies and there does not appear to be any significant changes in these organ systems over the past year.  The patient is under the impression that these organ systems are all stable and unchanged.    The patient will continue aggressive use of the  lymph pump.  This will continue to improve the edema control and prevent sequela such as ulcers and infections.   The patient will follow-up with me on an annual basis.    2. Chronic venous insufficiency  No surgery or intervention at this point in time.    I have reviewed my discussion with the patient regarding lymphedema and why it  causes symptoms.  Patient will continue wearing graduated compression stockings class 1 (20-30 mmHg) on a daily basis a prescription was given. The patient is reminded to put the stockings on first thing in the morning and removing them in the evening. The patient is instructed specifically not to sleep in the stockings.   In addition, behavioral modification throughout the day will be continued.  This will include frequent elevation (such as in a recliner), use of over the counter pain medications as needed and exercise such  as walking.  I have reviewed systemic causes for chronic edema such as liver, kidney and cardiac etiologies and there does  not appear to be any significant changes in these organ systems over the past year.  The patient is under the impression that these organ systems are all stable and unchanged.    The patient will continue aggressive use of the  lymph pump.  This will continue to improve the edema control and prevent sequela such as ulcers and infections.   The patient will follow-up with me on an annual basis.    3. Essential hypertension Continue antihypertensive medications as already ordered, these medications have been reviewed and there are no changes at this time.   4. Type 2 diabetes mellitus with vascular disease ([place]) Continue hypoglycemic medications as already ordered, these medications have been reviewed and there are no changes at this time.  Hgb A1C to be monitored as already arranged by primary service   5. Mixed hyperlipidemia Continue statin as ordered and reviewed, no changes at this time     Hortencia Pilar, MD  12/18/2019 10:58 AM

## 2019-12-23 ENCOUNTER — Encounter (INDEPENDENT_AMBULATORY_CARE_PROVIDER_SITE_OTHER): Payer: Self-pay | Admitting: Vascular Surgery

## 2019-12-25 ENCOUNTER — Encounter: Payer: Self-pay | Admitting: Podiatry

## 2019-12-25 ENCOUNTER — Ambulatory Visit (INDEPENDENT_AMBULATORY_CARE_PROVIDER_SITE_OTHER): Payer: Medicaid Other | Admitting: Podiatry

## 2019-12-25 ENCOUNTER — Other Ambulatory Visit: Payer: Self-pay

## 2019-12-25 DIAGNOSIS — M79674 Pain in right toe(s): Secondary | ICD-10-CM

## 2019-12-25 DIAGNOSIS — E1159 Type 2 diabetes mellitus with other circulatory complications: Secondary | ICD-10-CM

## 2019-12-25 DIAGNOSIS — N189 Chronic kidney disease, unspecified: Secondary | ICD-10-CM

## 2019-12-25 DIAGNOSIS — B351 Tinea unguium: Secondary | ICD-10-CM

## 2019-12-25 DIAGNOSIS — I872 Venous insufficiency (chronic) (peripheral): Secondary | ICD-10-CM

## 2019-12-25 DIAGNOSIS — M79675 Pain in left toe(s): Secondary | ICD-10-CM | POA: Diagnosis not present

## 2019-12-25 NOTE — Progress Notes (Signed)
Complaint:  Visit Type: Patient returns to my office for continued preventative foot care services. Complaint: Patient states" my nails have grown long and thick and become painful to walk and wear shoes" Patient has been diagnosed with DM with vascular disease and chronic venous insufficiency and chronic kidney disease.. The patient presents for preventative foot care services.  Podiatric Exam: Vascular: dorsalis pedis and posterior tibial pulses are weakly  palpable bilateral. Capillary return is immediate. Temperature gradient is WNL. Skin turgor WNL  Sensorium: Normal Semmes Weinstein monofilament test. Normal tactile sensation bilaterally. Nail Exam: Pt has thick disfigured discolored nails with subungual debris noted bilateral entire nail hallux through fifth toenails Ulcer Exam: There is no evidence of ulcer or pre-ulcerative changes or infection. Orthopedic Exam: Muscle tone and strength are WNL. No limitations in general ROM. No crepitus or effusions noted. Foot type and digits show no abnormalities. Bony prominences are unremarkable. Skin: No Porokeratosis. No infection or ulcers  Diagnosis:  Onychomycosis, , Pain in right toe, pain in left toes  Treatment & Plan Procedures and Treatment: Consent by patient was obtained for treatment procedures.   Debridement of mycotic and hypertrophic toenails, 1 through 5 bilateral and clearing of subungual debris. No ulceration, no infection noted.  Return Visit-Office Procedure: Patient instructed to return to the office for a follow up visit 3 months for continued evaluation and treatment.    Gardiner Barefoot DPM

## 2020-01-26 ENCOUNTER — Other Ambulatory Visit: Payer: Medicaid Other

## 2020-01-30 NOTE — Progress Notes (Signed)
Patient ID: Emily [name], female    DOB: 15-Nov-1960, 60 y.o.   MRN: [id number]  HPI  Emily [name] is [name] 60 y/o female with [name] history of asthma, DM, hyperlipidemia, HTN, stroke, thyroid disease, COPD, sleep apnea, former tobacco use and chronic heart failure.   Echo report from 02/13/2018 reviewed and showed an EF of 60-65% without LVH.    Has not been admitted or been in the ED in the last 6 months.    She presents today for [name] follow-up visit with [name] chief complaint of moderate fatigue upon minimal exertion. She says that she feels tired "all the time" and that this has been present for several years with varying levels of severity. She has associated shortness of breath, pedal edema, headaches and intermittent back pain along with this. She reports chronic difficulty sleeping and feels like she's been sleeping more during the day. She denies any dizziness, abdominal distention, palpitations, chest pain, cough or weight gain.   Has an empty bottle of nuvigil that is dated June 2021. She says that she felt better when she was taking this. Doesn't seem to understand that she needs to follow-up with pulmonology regarding this medication so he can decide if it needs to be continued or not.    Past Medical History:  Diagnosis Date  . Arthritis    hands, all over  . Asthma   . CHF (congestive heart failure) ([place])   . COPD (chronic obstructive pulmonary disease) (HCC)    on 2L home o2  . Diabetes mellitus without complication ([place])    type 2  . Dyspnea   . Hyperlipidemia   . Hypertension   . Sleep apnea    CPAP  . Stroke [facility])    Was told at recent hospital visit she has stroke in past  . Thyroid disease   . Thyroid nodule 04/30/2016   Past Surgical History:  Procedure Laterality Date  . arm surgery Left   . COLONOSCOPY WITH PROPOFOL N/[name] 02/14/2019   Procedure: COLONOSCOPY WITH PROPOFOL;  Surgeon: [name], MD;  Location: [facility] ENDOSCOPY;  Service: Endoscopy;  Laterality: N/[name];    Family History  Problem Relation Age of Onset  . Diabetes Mother   . Hypertension Mother   . Diabetes Father   . Hypertension Father   . CAD Father   . Diabetes Sister    Social History   Tobacco Use  . Smoking status: Former Smoker    Packs/day: 1.50    Years: 40.00    Pack years: 60.00    Types: Cigarettes    Quit date: 01/13/2016    Years since quitting: 4.0  . Smokeless tobacco: Never Used  Substance Use Topics  . Alcohol use: No   No Known Allergies  Prior to Admission medications   Medication Sig Start Date End Date Taking? Authorizing Provider  Accu-Chek Softclix Lancets lancets Use as instructed to check blood sugar 3 times [name] day. E11.59 09/05/19  Yes Philemon Kingdom, MD  albuterol (PROVENTIL HFA;VENTOLIN HFA) 108 (90 Base) MCG/ACT inhaler Inhale 1-2 puffs into the lungs every 6 (six) hours as needed for wheezing or shortness of breath. 07/16/16  Yes Doles-Johnson, Teah, NP  albuterol (PROVENTIL) (2.5 MG/3ML) 0.083% nebulizer solution Take 3 mLs (2.5 mg total) by nebulization every 6 (six) hours as needed for wheezing or shortness of breath. 11/18/17  Yes Wieting, Richard, MD  amLODipine (NORVASC) 10 MG tablet Take 10 mg by mouth daily. am   Yes [provider]  Armodafinil 150 MG tablet Take 150 mg by mouth daily. Patient not taking   Yes [provider]  aspirin EC 81 MG tablet Take 81 mg by mouth daily.   Yes [provider]  atorvastatin (LIPITOR) 40 MG tablet TAKE ONE TABLET BY MOUTH EVERY DAY Patient taking differently: Take 40 mg by mouth at bedtime. 04/21/16  Yes Tawni Millers, MD  BD PEN NEEDLE MICRO U/F 32G X 6 MM MISC SMARTSIG:Pre-Filled Pen Syringe Injection 3 Times Daily 09/22/19  Yes [provider]  Blood Glucose Monitoring Suppl (ACCU-CHEK GUIDE) w/Device KIT Use as instructed to check blood sugar 3 times [name] day. E11.59 09/05/19  Yes Philemon Kingdom, MD  budesonide-formoterol [facility]) 80-4.5 MCG/ACT inhaler Inhale 2  puffs into the lungs 2 (two) times daily. 11/18/17  Yes Loletha Grayer, MD  carvedilol (COREG) 25 MG tablet Take 1 tablet by mouth twice daily 05/06/19  Yes Emily [name], Emily [name]  ergocalciferol (VITAMIN D2) 1.25 MG (50000 UT) capsule Take by mouth. 05/24/19 05/23/20 Yes [provider]  esomeprazole (NEXIUM) 40 MG capsule Take 40 mg by mouth daily. am   Yes [provider]  glucose blood (ACCU-CHEK GUIDE) test strip Use as instructed to check blood sugar 3 times [name] day. E11.59 09/05/19  Yes Philemon Kingdom, MD  insulin glargine (LANTUS SOLOSTAR) 100 UNIT/ML Solostar Pen INJECT 50 UNITS SUBCUTANEOUSLY IN THE MORNING AND 30 UNITS AT NIGHT Patient taking differently: INJECT 30 UNITS SUBCUTANEOUSLY IN THE MORNING AND 30 UNITS AT NIGHT 08/28/19  Yes Philemon Kingdom, MD  ipratropium (ATROVENT) 0.06 % nasal spray Place 2 sprays into both nostrils 4 (four) times daily.   Yes [provider]  methimazole (TAPAZOLE) 5 MG tablet Take 0.5 tablets (2.5 mg total) by mouth daily. 10/05/19  Yes Philemon Kingdom, MD  metolazone (ZAROXOLYN) 5 MG tablet Take 5 mg by mouth daily.   Yes [provider]  NOVOLOG FLEXPEN 100 UNIT/ML FlexPen INJECT 16-20 UNITS SUBCUTANEOUSLY 2x TIMES DAILY before meals 06/13/19  Yes Philemon Kingdom, MD  potassium chloride (KLOR-CON) 10 MEQ tablet Take by mouth. 12/13/19 12/12/20 Yes [provider]  Semaglutide, 1 MG/DOSE, (OZEMPIC, 1 MG/DOSE,) 4 MG/3ML SOPN Inject 1 mg into the skin once [name] week. 10/03/19  Yes Philemon Kingdom, MD  tiotropium (SPIRIVA) 18 MCG inhalation capsule Place 18 mcg into inhaler and inhale daily.   Yes [provider]  Acetylcysteine (NAC) 600 MG CAPS Take by mouth daily. Patient not taking: Reported on 01/31/2020    [provider]   Review of Systems  Constitutional: Positive for fatigue (easily). Negative for appetite change.  HENT: Negative for congestion, postnasal drip and sore throat.   Eyes:  Positive for visual disturbance ("blurry vision").  Respiratory: Positive for shortness of breath (with moderate exertion). Negative for cough and wheezing.   Cardiovascular: Positive for leg swelling. Negative for chest pain and palpitations.  Gastrointestinal: Negative for abdominal distention and abdominal pain.  Endocrine: Negative.   Genitourinary: Negative.   Musculoskeletal: Positive for arthralgias (left leg pain at time) and back pain (at times). Negative for neck pain.  Skin: Negative.   Allergic/Immunologic: Negative.   Neurological: Positive for headaches. Negative for dizziness and light-headedness.  Hematological: Negative for adenopathy. Does not bruise/bleed easily.  Psychiatric/Behavioral: Negative for dysphoric mood and sleep disturbance (sleeping on 2-3 pillows). The patient is not nervous/anxious.    Vitals:   01/31/20 0845  BP: 127/77  Pulse: 97  Resp: 18  SpO2: 94%  Weight: 203 lb  6 oz (92.3 kg)  Height: _0  (1.626 m)   Wt Readings from Last 3 Encounters:  01/31/20 203 lb 6 oz (92.3 kg)  12/18/19 214 lb (97.1 kg)  12/12/19 213 lb 3.2 oz (96.7 kg)   Lab Results  Component Value Date   CREATININE 2.65 (H) 04/25/2019   CREATININE 1.87 (H) 04/05/2018   CREATININE 2.46 (H) 02/18/2018    Physical Exam Vitals and nursing note reviewed.  Constitutional:      Appearance: Normal appearance.  HENT:     Head: Normocephalic and atraumatic.  Cardiovascular:     Rate and Rhythm: Normal rate and regular rhythm.  Pulmonary:     Effort: Pulmonary effort is normal. No respiratory distress.     Breath sounds: No wheezing or rales.  Abdominal:     General: There is no distension.     Palpations: Abdomen is soft.  Musculoskeletal:        General: No tenderness.     Cervical back: Normal range of motion and neck supple.     Right lower leg: No edema.     Left lower leg: No edema.  Skin:    General: Skin is warm and dry.  Neurological:     General: No focal  deficit present.     Mental Status: She is alert and oriented to person, place, and time.  Psychiatric:        Mood and Affect: Mood normal.        Behavior: Behavior normal.        Thought Content: Thought content normal.     Assessment & Plan:  1: Chronic heart failure with preserved ejection fraction- - NYHA class III - euvolemic today - weighing daily; reminded to call for an overnight weight gain of >2 pounds or [name] weekly weight gain of >5 pounds - weight down 18 pounds from last visit here 9 months ago - not adding salt and has been using Mrs. Dash for seasoning. - wearing compression socks daily with removal at bedtime along with compression pumps that she uses "sometimes" - echo ordered for 04/23/20 - saw vascular (Schnier) 12/18/19 - BNP 04/25/19 was 275.0 - says that she's received both her COVID vaccines  2: HTN- - BP looks good today - sees PCP (Headrick) - BMP from 12/08/2019 reviewed and showed sodium 140, potassium 2.8, creatinine 3.08 and GFR 18 - potassium supplements resumed by nephrology  3: COPD- - saw pulmonology Lanney Gins) 04/20/19 - wrote on patient's AVS that she needed to schedule f/u appointment with pulmonology  4: Obstructive sleep apnea- - has CPAP but she says that she doesn't wear it nightly; encouraged her to wear it nightly and explained that over time, it will help her feel better - needs to f/u with pulmonology regarding nuvigil  5: DM with CKD- - fasting glucose at home today was 53; OJ, pb and graham crackers given while here and glucose was 59 without hypoglycemic symptoms; patient says that she's going straight home and will eat breakfast then; 2 containers of OJ were given to patient to take with her - A1c 12/12/19 was 7.1% - saw endocrinology Cruzita Lederer) 12/12/19 - saw nephrology (Menefee) 12/13/19   Medication bottles were reviewed.   Return in 3 months or sooner for any questions/problems before then.

## 2020-01-31 ENCOUNTER — Encounter: Payer: Self-pay | Admitting: Family

## 2020-01-31 ENCOUNTER — Ambulatory Visit: Payer: Medicaid Other | Attending: Family | Admitting: Family

## 2020-01-31 ENCOUNTER — Other Ambulatory Visit: Payer: Self-pay

## 2020-01-31 VITALS — BP 127/77 | HR 97 | Resp 18 | Ht 64.0 in | Wt 203.4 lb

## 2020-01-31 DIAGNOSIS — Z7951 Long term (current) use of inhaled steroids: Secondary | ICD-10-CM | POA: Diagnosis not present

## 2020-01-31 DIAGNOSIS — I130 Hypertensive heart and chronic kidney disease with heart failure and stage 1 through stage 4 chronic kidney disease, or unspecified chronic kidney disease: Secondary | ICD-10-CM | POA: Insufficient documentation

## 2020-01-31 DIAGNOSIS — N184 Chronic kidney disease, stage 4 (severe): Secondary | ICD-10-CM

## 2020-01-31 DIAGNOSIS — E785 Hyperlipidemia, unspecified: Secondary | ICD-10-CM | POA: Diagnosis not present

## 2020-01-31 DIAGNOSIS — E1122 Type 2 diabetes mellitus with diabetic chronic kidney disease: Secondary | ICD-10-CM | POA: Diagnosis not present

## 2020-01-31 DIAGNOSIS — G4733 Obstructive sleep apnea (adult) (pediatric): Secondary | ICD-10-CM

## 2020-01-31 DIAGNOSIS — N189 Chronic kidney disease, unspecified: Secondary | ICD-10-CM | POA: Diagnosis not present

## 2020-01-31 DIAGNOSIS — Z87891 Personal history of nicotine dependence: Secondary | ICD-10-CM | POA: Diagnosis not present

## 2020-01-31 DIAGNOSIS — Z7982 Long term (current) use of aspirin: Secondary | ICD-10-CM | POA: Insufficient documentation

## 2020-01-31 DIAGNOSIS — Z794 Long term (current) use of insulin: Secondary | ICD-10-CM | POA: Insufficient documentation

## 2020-01-31 DIAGNOSIS — J449 Chronic obstructive pulmonary disease, unspecified: Secondary | ICD-10-CM

## 2020-01-31 DIAGNOSIS — I5032 Chronic diastolic (congestive) heart failure: Secondary | ICD-10-CM | POA: Insufficient documentation

## 2020-01-31 DIAGNOSIS — I10 Essential (primary) hypertension: Secondary | ICD-10-CM

## 2020-01-31 LAB — GLUCOSE, CAPILLARY: Glucose-Capillary: 59 mg/dL — ABNORMAL LOW (ref 70–99)

## 2020-01-31 NOTE — Patient Instructions (Addendum)
Continue weighing daily and call for an overnight weight gain of > 2 pounds or a weekly weight gain of >5 pounds.   Call your lung doctor and schedule a follow-up appointment.

## 2020-02-02 ENCOUNTER — Telehealth: Payer: Self-pay

## 2020-02-02 ENCOUNTER — Other Ambulatory Visit: Payer: Self-pay

## 2020-02-02 ENCOUNTER — Other Ambulatory Visit (INDEPENDENT_AMBULATORY_CARE_PROVIDER_SITE_OTHER): Payer: Medicaid Other

## 2020-02-02 DIAGNOSIS — E0590 Thyrotoxicosis, unspecified without thyrotoxic crisis or storm: Secondary | ICD-10-CM

## 2020-02-02 LAB — T3, FREE: T3, Free: 2.7 pg/mL (ref 2.3–4.2)

## 2020-02-02 LAB — TSH: TSH: 0.49 u[IU]/mL (ref 0.35–4.50)

## 2020-02-02 LAB — T4, FREE: Free T4: 0.8 ng/dL (ref 0.60–1.60)

## 2020-02-02 NOTE — Telephone Encounter (Signed)
-----   Message from Philemon Kingdom, MD sent at 02/02/2020 12:47 PM EST ----- Can you please call pt.:  Thyroid tests are all normal.  Please continue the current dose of methimazole.  I will recheck her tests when she returns in March.

## 2020-02-02 NOTE — Telephone Encounter (Signed)
Spoke with pt regarding Lab results and she understood w/o any questions/concerns.  Emily [name]

## 2020-03-12 ENCOUNTER — Ambulatory Visit (INDEPENDENT_AMBULATORY_CARE_PROVIDER_SITE_OTHER): Payer: Medicaid Other | Admitting: Internal Medicine

## 2020-03-12 ENCOUNTER — Encounter: Payer: Self-pay | Admitting: Internal Medicine

## 2020-03-12 ENCOUNTER — Other Ambulatory Visit: Payer: Self-pay

## 2020-03-12 ENCOUNTER — Telehealth: Payer: Self-pay

## 2020-03-12 VITALS — BP 128/82 | HR 98 | Ht 64.0 in | Wt 216.6 lb

## 2020-03-12 DIAGNOSIS — Z794 Long term (current) use of insulin: Secondary | ICD-10-CM | POA: Diagnosis not present

## 2020-03-12 DIAGNOSIS — E1165 Type 2 diabetes mellitus with hyperglycemia: Secondary | ICD-10-CM

## 2020-03-12 DIAGNOSIS — E041 Nontoxic single thyroid nodule: Secondary | ICD-10-CM | POA: Diagnosis not present

## 2020-03-12 DIAGNOSIS — E119 Type 2 diabetes mellitus without complications: Secondary | ICD-10-CM | POA: Diagnosis not present

## 2020-03-12 DIAGNOSIS — E1159 Type 2 diabetes mellitus with other circulatory complications: Secondary | ICD-10-CM

## 2020-03-12 DIAGNOSIS — E049 Nontoxic goiter, unspecified: Secondary | ICD-10-CM

## 2020-03-12 DIAGNOSIS — E0590 Thyrotoxicosis, unspecified without thyrotoxic crisis or storm: Secondary | ICD-10-CM | POA: Diagnosis not present

## 2020-03-12 DIAGNOSIS — E108 Type 1 diabetes mellitus with unspecified complications: Secondary | ICD-10-CM

## 2020-03-12 LAB — TSH: TSH: 2.12 u[IU]/mL (ref 0.35–4.50)

## 2020-03-12 LAB — POCT GLYCOSYLATED HEMOGLOBIN (HGB A1C): Hemoglobin A1C: 8.2 % — AB (ref 4.0–5.6)

## 2020-03-12 LAB — T3, FREE: T3, Free: 3.7 pg/mL (ref 2.3–4.2)

## 2020-03-12 LAB — T4, FREE: Free T4: 0.61 ng/dL (ref 0.60–1.60)

## 2020-03-12 MED ORDER — LANTUS SOLOSTAR 100 UNIT/ML ~~LOC~~ SOPN: PEN_INJECTOR | SUBCUTANEOUS | 2 refills | Status: DC

## 2020-03-12 MED ORDER — NOVOLOG FLEXPEN 100 UNIT/ML ~~LOC~~ SOPN: PEN_INJECTOR | SUBCUTANEOUS | 5 refills | Status: DC

## 2020-03-12 NOTE — Telephone Encounter (Signed)
-----   Message from Philemon Kingdom, MD sent at 03/12/2020  4:39 PM EST ----- Can you please call pt.:  Her thyroid tests are normal.  Please stay on the same dose of methimazole for now.

## 2020-03-12 NOTE — Patient Instructions (Signed)
STOP SODAS or ANY SWEET DRINKS!!!  Please continue: - Lantus 30 units in a.m. and 30 units at bedtime - Ozempic 1 mg weekly  Increase: - NovoLog 18-20 units before breakfast and 22-24 units before dinner   Continue: - Methimazole 2.5 mg daily.  Please stop at the lab.  Please return in 3-4 months with your sugar log.

## 2020-03-12 NOTE — Progress Notes (Addendum)
Patient ID: [name], female   DOB: 1960-09-11, 60 y.o.   MRN: [id number]  This visit occurred during the SARS-CoV-2 public health emergency.  Safety protocols were in place, including screening questions prior to the visit, additional usage of staff PPE, and extensive cleaning of exam room while observing appropriate contact time as indicated for disinfecting solutions.   HPI  Emily [name] is a 60 y.o.-year-old female, returning for f/u for thyrotoxicosis/goiter and DM2, uncontrolled, insulin-dependent, with complications (CKD stage 3, cerebro-vascular ds. - s/p stroke). Last visit 3 months ago.  TMNG/Graves ds.: Reviewed and addended history: Pt was dx'ed with hyperthyroidism in ~2014 >> started MMI, lately 10 mg 2x daily. She was lost for f/u at [facility] as she could not travel over there.  Thyroid uptake and scan (06/22/2013) showed: FINDINGS: The 24 hour uptake was 24.7%. The normal reference range at 24 hours is 15 to 35%.   The thyroid scan demonstrates symmetric, mildly heterogenous distribution of radiopharmaceutical throughout the thyroid, which is increased in size.  IMPRESSION: Mildly heterogenous iodine uptake, cannot exclude toxic multinodular goiter versus possible mild Graves disease. Consider ultrasound for further correlation.  Patient was on methimazole before, however, this was stopped after  TSH returned very elevated, in the 90s (02/2015) >> TSH decreased to 8, then normalized >> we continued her off MMI.   However,she was started on MMI 10 mg 2x a day (re-Rx by Dr. Rosanna Randy: 04/17/2016, [facility]) - I do not think he was aware that we had stopped MMI.  We stopped methimazole again in 05/2016.  However, we had to restart again methimazole in 05/2017.  She was on 5 mg daily, but this was stopped during the hospitalization for COPD and CHF exacerbation 02/2018.  In 12/2018 we had to restart methimazole 5 mg daily.  In 09/2019, we called her to decrease  the dose to 2.5 mg daily, but she did not get the msg >> still on 5 mg daily.  In 12/2019, we decreased the methimazole dose to 2.5 mg daily.  Reviewed her TFTs: Lab Results  Component Value Date   TSH 0.49 02/02/2020   TSH 3.80 12/12/2019   TSH 4.37 10/03/2019   TSH 2.99 06/13/2019   TSH 2.09 03/06/2019   TSH 0.15 (L) 12/26/2018   TSH 0.37 07/29/2018   TSH 0.45 04/05/2018   TSH 0.757 11/14/2017   TSH 1.61 09/27/2017   FREET4 0.80 02/02/2020   FREET4 0.70 12/12/2019   FREET4 0.57 (L) 10/03/2019   FREET4 0.66 06/13/2019   FREET4 0.75 03/06/2019   FREET4 0.90 12/26/2018   FREET4 0.81 07/29/2018   FREET4 0.79 04/05/2018   FREET4 0.71 09/27/2017   FREET4 0.92 05/24/2017   Previously:   Thyroid U/S (05/12/2016): Large isthmic nodule Thyroid tissue is diffusely heterogeneous and enlarged.  The isthmus has a large isoechoic nodular structure. Unclear if this represents a discrete nodule versus focal heterogeneous thyroid tissue. This area measures up to 3.3 cm and recommend biopsy of this area.  Numerous small nodules and cysts throughout the left and right thyroid lobes. These predominantly cystic nodules do not meet criteria for biopsy or dedicated follow-up.  FNA of the isthmic nodule (06/25/2016): Benign  She still did not have an ultrasound checked...  Pt denies: - feeling nodules in neck - hoarseness - dysphagia - choking - SOB with lying down  Pt does have a FH of thyroid ds.: aunt. No FH of thyroid cancer. No h/o radiation tx to head or neck.  No herbal supplements. No Biotin use. No recent steroids use.   DM2: She has a history of noncompliance with medications. She had several complications: History of stroke, also CKD, DR.  Reviewed HbA1c levels: Lab Results  Component Value Date   HGBA1C 7.1 (A) 12/12/2019   HGBA1C 7.4 (A) 10/03/2019   HGBA1C 9.3 (A) 06/13/2019  10/03/2019: HbA1c 7.4% but HbA1c calculated from fructosamine is 5.4%.  She was  on: - Lantus 45 units 2x a day >> was off insulin - "the pharmacy did not receive it" - Januvia 50 mg in am >> stopped (?) - Glipizide 10 mg 2x a day >> started in the hospital We added Jardiance at last visit >> not taking  - "the pharmacy did not receive it" She was on Metformin >> diarrhea.  Now on: - Trulicity 1.5 mg weekly >> Ozempic 0.5 mg weekly >> off for >1 mo >> Ozempic 0.5 mg weekly restarted 07/2019 >> 1 mg weekly - Lantus 50 >> 30 units twice a day - NovoLog 16 units before breakfast and  20 units before dinner We had to start Jardiance 10 mg 02/2018 due to decreased kidney function.  She is checking sugars twice a day: - am: 75-HI >> 82-181 >> 77, 141-273 - 2h after b'fast: n/c - lunch: n/c - 2h after lunch: n/c >> 178 >> 121 >> 128 - dinner: 110-146 >> n/c >> 83-136 >> 165 - 2h after dinner:  95-137, 226 >> 220-257 - bedtime:  n/c >> 99-181, 207 >> 156-265 - nighttime: 79, 104-218, 223 >> 191-381 Lowest: 82 >> 77 Highest: 381 >> 226 >> 381  + CKD; latest BUN/Cr: Lab Results  Component Value Date   BUN 30 (H) 04/25/2019   Lab Results  Component Value Date   CREATININE 2.65 (H) 04/25/2019  GFR 24 (02/18/2018)  + HL; latest lipid panel: Lab Results  Component Value Date   CHOL 135 07/27/2019   HDL 46.30 07/27/2019   LDLCALC 67 07/27/2019   TRIG 109.0 07/27/2019   CHOLHDL 3 07/27/2019  On Lipitor 40.  Latest dilated eye exam was in 01/2018: + DR  ROS: Constitutional: no weight gain/no weight loss, no fatigue, no subjective hyperthermia, no subjective hypothermia Eyes: + blurry vision, no xerophthalmia ENT: no sore throat, + see HPI Cardiovascular: no CP/no SOB/no palpitations/no leg swelling Respiratory: no cough/no SOB/no wheezing Gastrointestinal: no N/no V/no D/no C/no acid reflux Musculoskeletal: no muscle aches/no joint aches Skin: no rashes, no hair loss Neurological: no tremors/no numbness/no tingling/no dizziness  I reviewed pt's  medications, allergies, PMH, social hx, family hx, and changes were documented in the history of present illness. Otherwise, unchanged from my initial visit note.  Past Medical History:  Diagnosis Date  . Arthritis    hands, all over  . Asthma   . CHF (congestive heart failure) ([place])   . COPD (chronic obstructive pulmonary disease) ([facility])    on 2L home o2  . Diabetes mellitus without complication ([place])    type 2  . Dyspnea   . Hyperlipidemia   . Hypertension   . Sleep apnea    CPAP  . Stroke [facility])    Was told at recent hospital visit she has stroke in past  . Thyroid disease   . Thyroid nodule 04/30/2016   Past Surgical History:  Procedure Laterality Date  . arm surgery Left   . COLONOSCOPY WITH PROPOFOL N/A 02/14/2019   Procedure: COLONOSCOPY WITH PROPOFOL;  Surgeon: [name], MD;  Location[facility]  ENDOSCOPY;  Service: Endoscopy;  Laterality: N/A;   Social History   Social History  . Marital Status: Single    Spouse Name: N/A  . Number of Children: 1   Occupational History  . None   Social History Main Topics  . Smoking status: Current Every Day Smoker -- 1.00 packs/day  . Smokeless tobacco: Not on file  . Alcohol Use: No  . Drug Use: No   Current Outpatient Medications on File Prior to Visit  Medication Sig Dispense Refill  . Accu-Chek Softclix Lancets lancets Use as instructed to check blood sugar 3 times a day. E11.59 300 each 12  . Acetylcysteine (NAC) 600 MG CAPS Take by mouth daily. (Patient not taking: Reported on 01/31/2020)    . albuterol (PROVENTIL HFA;VENTOLIN HFA) 108 (90 Base) MCG/ACT inhaler Inhale 1-2 puffs into the lungs every 6 (six) hours as needed for wheezing or shortness of breath. 1 Inhaler 11  . albuterol (PROVENTIL) (2.5 MG/3ML) 0.083% nebulizer solution Take 3 mLs (2.5 mg total) by nebulization every 6 (six) hours as needed for wheezing or shortness of breath. 100 vial 0  . amLODipine (NORVASC) 10 MG tablet Take 10 mg by mouth daily. am    .  Armodafinil 150 MG tablet Take 150 mg by mouth daily.    Marland Kitchen aspirin EC 81 MG tablet Take 81 mg by mouth daily.    Marland Kitchen atorvastatin (LIPITOR) 40 MG tablet TAKE ONE TABLET BY MOUTH EVERY DAY (Patient taking differently: Take 40 mg by mouth at bedtime.) 90 tablet PRN  . BD PEN NEEDLE MICRO U/F 32G X 6 MM MISC SMARTSIG:Pre-Filled Pen Syringe Injection 3 Times Daily    . Blood Glucose Monitoring Suppl (ACCU-CHEK GUIDE) w/Device KIT Use as instructed to check blood sugar 3 times a day. E11.59 1 kit 0  . budesonide-formoterol (SYMBICORT) 80-4.5 MCG/ACT inhaler Inhale 2 puffs into the lungs 2 (two) times daily. 1 Inhaler 12  . carvedilol (COREG) 25 MG tablet Take 1 tablet by mouth twice daily 180 tablet 3  . ergocalciferol (VITAMIN D2) 1.25 MG (50000 UT) capsule Take by mouth.    . esomeprazole (NEXIUM) 40 MG capsule Take 40 mg by mouth daily. am    . glucose blood (ACCU-CHEK GUIDE) test strip Use as instructed to check blood sugar 3 times a day. E11.59 300 each 12  . insulin glargine (LANTUS SOLOSTAR) 100 UNIT/ML Solostar Pen INJECT 50 UNITS SUBCUTANEOUSLY IN THE MORNING AND 30 UNITS AT NIGHT (Patient taking differently: INJECT 30 UNITS SUBCUTANEOUSLY IN THE MORNING AND 30 UNITS AT NIGHT) 72 mL 2  . ipratropium (ATROVENT) 0.06 % nasal spray Place 2 sprays into both nostrils 4 (four) times daily.    . methimazole (TAPAZOLE) 5 MG tablet Take 0.5 tablets (2.5 mg total) by mouth daily. 90 tablet 1  . metolazone (ZAROXOLYN) 5 MG tablet Take 5 mg by mouth daily.    Marland Kitchen NOVOLOG FLEXPEN 100 UNIT/ML FlexPen INJECT 16-20 UNITS SUBCUTANEOUSLY 2x TIMES DAILY before meals 15 mL 5  . potassium chloride (KLOR-CON) 10 MEQ tablet Take by mouth.    . Semaglutide, 1 MG/DOSE, (OZEMPIC, 1 MG/DOSE,) 4 MG/3ML SOPN Inject 1 mg into the skin once a week. 9 mL 3  . tiotropium (SPIRIVA) 18 MCG inhalation capsule Place 18 mcg into inhaler and inhale daily.     No current facility-administered medications on file prior to visit.   No  Known Allergies Family History  Problem Relation Age of Onset  . Diabetes Mother   .  Hypertension Mother   . Diabetes Father   . Hypertension Father   . CAD Father   . Diabetes Sister    PE: BP 128/82 (BP Location: Right Arm, Patient Position: Sitting, Cuff Size: Large)   Pulse 98   Ht 5' 4" (1.626 m)   Wt 216 lb 9.6 oz (98.2 kg)   SpO2 (!) 87%   BMI 37.18 kg/m  Body mass index is 37.18 kg/m.Addendum  Wt Readings from Last 3 Encounters:  03/12/20 216 lb 9.6 oz (98.2 kg)  01/31/20 203 lb 6 oz (92.3 kg)  12/18/19 214 lb (97.1 kg)   Constitutional: overweight, in NAD Eyes: PERRLA, EOMI, no exophthalmos ENT: moist mucous membranes, +L>R thyromegaly, no cervical lymphadenopathy Cardiovascular: RRR, No MRG, +L>R LE edema Respiratory: CTA B Gastrointestinal: abdomen soft, NT, ND, BS+ Musculoskeletal: no deformities, strength intact in all 4 Skin: moist, warm, no rashes Neurological: no tremor with outstretched hands, DTR normal in all 4  ASSESSMENT: 1. Thyrotoxicosis  2. Large goiter  Thyroid U/S (05/12/2016):  Parenchymal Echotexture: Moderately heterogenous Isthmus: 1.2 cm in the AP dimension Right lobe: 8.4 x 2.8 x 4.5 cm Left lobe: 8.2 x 3.0 x 3.7 cm ______________________________________________  Nodule # 1: Location: Isthmus; Inferior Maximum size: 3.3 cm; Other 2 dimensions: 1.7 x 2.7 cm Composition: solid/almost completely solid (2) Echogenicity: isoechoic (1)  **Given size (>/= 2.5 cm) and appearance, fine needle aspiration of this mildly suspicious nodule should be considered based on TI-RADS criteria. _______________________________________________________  Mildly complex cystic structure in the superior right thyroid lobe measuring up to 1.0 cm. Minimally complex cyst in the superior right thyroid lobe measuring up to 1.6 cm. Adjacent complex cysts or spongiform nodules in the mid right thyroid lobe, largest measuring 1.1 cm. Spongiform nodule in the  mid left thyroid lobe measuring up to 1.3 cm.  Nodule # 2: Location: Left; Mid Maximum size: 2.0 cm; Other 2 dimensions: 0.8 x 0.8 cm Composition: mixed cystic and solid (1) Echogenicity: cannot determine (1)  This nodule does NOT meet TI-RADS criteria for biopsy or dedicated follow-up. _______________________________________________________  IMPRESSION: Thyroid tissue is diffusely heterogeneous and enlarged.  The isthmus has a large isoechoic nodular structure. Unclear if this represents a discrete nodule versus focal heterogeneous thyroid tissue. This area measures up to 3.3 cm and recommend biopsy of this area.  Numerous small nodules and cysts throughout the left and right thyroid lobes. These predominantly cystic nodules do not meet criteria for biopsy or dedicated follow-up.  The above is in keeping with the ACR TI-RADS recommendations - J Am Coll Radiol 2017;14:587-595.   Electronically Signed By: Markus Daft M.D. On: 05/12/2016 12:03   FNA (06/25/2016): Adequacy Reason Satisfactory For Evaluation. Diagnosis THYROID, FINE NEEDLE ASPIRATION ISTHMUS INFERIOR (SPECIMEN 1 OF 1, COLLECTED ON 06/25/2016): CONSISTENT WITH BENIGN FOLLICULAR NODULE (B[place]ESDA CATEGORY II). Casimer Lanius MD Pathologist, Electronic Signature (Case signed 06/26/2016) Specimen Clinical Information Nodule 1 Isthmus Inferior 3.3 cm; Other 2 dimensions: 1.7 x 2.7 cm, Solid / almost completely solid, Isoechoic, ACR TI-RADS total points: 3, Midly suspicious nodule Source Thyroid, Fine Needle Aspiration, Isthmus Inferior, (Specimen 1 of 1, colected on 06/25/16 )  3. DM2, insulin dependent, uncontrolled  PLAN:  1. And 2.  -Patient with history of thyrotoxicosis, either due to mild Graves' disease or mild toxic multinodular goiter per review of her thyroid uptake and scan report -She tolerates methimazole well -At last visit, instead of taking 2.5 mg daily as advised, she was still taking 5 mg  daily  as she did not get my message... We rechecked TFTs and the TSH was still at the upper limit of normal so I again advised her to decrease the dose to 2.5 mg daily.  On this dose, TFTs were normal in 01/2020, although TSH was close to the lower limit of normal.  -She continues on methimazole 2.5 mg daily now -We will recheck her TFTs today and adjust methimazole dose accordingly  2. Large goiter -She continues to have minimal neck compression symptoms, but not bothersome -The dominant nodule measures 3.3 cm and had a benign biopsy in 2018 -She did not have a thyroid ultrasound yet despite the fact that I ordered it twice for her... -I reordered the ultrasound again today  3. DM2 -Patient with longstanding, fairly well-controlled type 2 diabetes, on basal-bolus insulin regimen and weekly GLP-1 receptor agonist, with improved control at last visit-HbA1c 7.1%.  Sugars improved significantly fter increasing the Ozempic dose and they were mostly at goal at last visit, with only few hyperglycemic excursions.  We did not change the regimen at that time. -At today's visit, sugars are much higher.  Upon questioning, she is drinking 2-3 regular sodas a day.  I strongly advised her to stop these since sugars frequently go to 200s and even 300s.  We will increase her NovoLog slightly, but until she stops sweet drinks, would not be able to control her diabetes. - I advised her to: Patient Instructions  STOP SODAS or ANY SWEET DRINKS!!!  Please continue: - Lantus 30 units in a.m. and 30 units at bedtime - Ozempic 1 mg weekly  Increase: - NovoLog 18-20 units before breakfast and 22-24 units before dinner   Continue: - Methimazole 2.5 mg daily.  Please stop at the lab.  Please return in 3-4 months with your sugar log.   - we checked her HbA1c: 8.2% (higher) - advised to check sugars at different times of the day - 2-3x a day, rotating check times - advised for yearly eye exams >> she is not UTD  -printed the telephone number for [facility] where she went before, in 2020 and advised her to call her and schedule a new appointment - return to clinic in 3-4 months  CC: PCP: Gennette Pac, FNP - [phone]  Component     Latest Ref Rng & Units 03/12/2020  T4,Free(Direct)     0.60 - 1.60 ng/dL 0.61  TSH     0.35 - 4.50 uIU/mL 2.12  Triiodothyronine,Free,Serum     2.3 - 4.2 pg/mL 3.7  TFTs are normal.  At next visit, I will repeat her TFTs and may be able to stop methimazole at that time.  Neck U/S (05/07/2020): Parenchymal Echotexture: Moderately heterogenous Isthmus: 0.9 cm thickness, previously 1.2 Right lobe: 9.9 x 3.9 x 3.4 cm, previously 8.4 x 2.8 x 4.5 Left lobe: 8.6 x 3 x 3.3 cm, previously 8.2 x 3 x 3.7 _________________________________________________________  Nodule # 1: 3.6 x 3.4 x 1.7 cm isthmic nodule, previously 3.3 x 2.7 x 1.7; this was previously biopsied.  Nodule # 2: Prior biopsy: No Location: Right; superior Maximum size: 1.2 cm; Other 2 dimensions: 0.8 x 0.7 cm, previously, 1 x 0.9 x 0.7 cm Composition: solid/almost completely solid (2) Echogenicity: hypoechoic (2) *Given size (>/= 1 - 1.4 cm) and appearance, a follow-up ultrasound in 1 year should be considered based on TI-RADS criteria. _________________________________________________________  Nodule # 3: Prior biopsy: No Location: Right; superior posterior Maximum size: 1.6 cm; Other 2 dimensions: 1.4  x 1 cm, previously, 1.6 x 1.3 x 1.3 cm Composition: mixed cystic and solid (1) Echogenicity: hypoechoic (2) *Given size (>/= 1.5 - 2.4 cm) and appearance, a follow-up ultrasound in 1 year should be considered based on TI-RADS criteria. _________________________________________________________  Nodule # 4: 1.3 cm benign colloid cyst, mid right  Nodule # 5: 1.1 cm benign colloid cyst, inferior medial right  Nodule # 6: 1.1 cm benign colloid cyst, inferior  right  Nodule # 7: Prior biopsy:  No Location: Left; mid Maximum size: 2.2 cm; Other 2 dimensions: 1.4 x 0.8 cm, previously, 0.9 x 0.8 x 0.7 cm Composition: mixed cystic and solid (1) Echogenicity: hypoechoic (2) Significant change in size (>/= 20% in two dimensions and minimal increase of 2 mm): Yes *Given size (>/= 1.5 - 2.4 cm) and appearance, a follow-up ultrasound in 1 year should be considered based on TI-RADS criteria. _________________________________________________________  Nodule # 8: Prior biopsy: No Location: Left; inferior Maximum size: 1.3 cm; Other 2 dimensions: 1.1 x 0.5 cm, previously, 1.3 x 1 x 0.7 cm Composition: spongiform (0) Echogenicity: hypoechoic (2) This nodule does NOT meet TI-RADS criteria for biopsy or dedicated follow-up. ________________________________________________________  Nodule # 9: 1.3 cm spongiform nodule, inferior left, previously 2 cm; This nodule does NOT meet TI-RADS criteria for biopsy or dedicated follow-up.  Nodule # 10: 1.1 cm complex cyst without calcifications, inferior left; This nodule does NOT meet TI-RADS criteria for biopsy or dedicated follow-up.  IMPRESSION: 1. Thyromegaly with bilateral nodules. None currently meets criteria for biopsy. 2. Recommend annual/biennial ultrasound follow-up of nodules as above, until stability x5 years confirmed.  Electronically Signed   By: Lucrezia Europe M.D.   On: 05/07/2020 15:28   Philemon Kingdom, MD PhD [facility]

## 2020-03-28 ENCOUNTER — Ambulatory Visit: Payer: Medicaid Other | Admitting: Podiatry

## 2020-03-28 ENCOUNTER — Encounter: Payer: Self-pay | Admitting: Podiatry

## 2020-03-28 ENCOUNTER — Other Ambulatory Visit: Payer: Self-pay

## 2020-03-28 DIAGNOSIS — I872 Venous insufficiency (chronic) (peripheral): Secondary | ICD-10-CM | POA: Diagnosis not present

## 2020-03-28 DIAGNOSIS — E1159 Type 2 diabetes mellitus with other circulatory complications: Secondary | ICD-10-CM

## 2020-03-28 DIAGNOSIS — M79675 Pain in left toe(s): Secondary | ICD-10-CM

## 2020-03-28 DIAGNOSIS — B351 Tinea unguium: Secondary | ICD-10-CM | POA: Diagnosis not present

## 2020-03-28 DIAGNOSIS — N189 Chronic kidney disease, unspecified: Secondary | ICD-10-CM | POA: Diagnosis not present

## 2020-03-28 DIAGNOSIS — M79674 Pain in right toe(s): Secondary | ICD-10-CM

## 2020-03-28 NOTE — Progress Notes (Signed)
Complaint:  Visit Type: Patient returns to my office for continued preventative foot care services. Complaint: Patient states" my nails have grown long and thick and become painful to walk and wear shoes" Patient has been diagnosed with DM with vascular disease and chronic venous insufficiency and chronic kidney disease.. The patient presents for preventative foot care services.  Podiatric Exam: Vascular: dorsalis pedis and posterior tibial pulses are weakly  palpable bilateral. Capillary return is immediate. Temperature gradient is WNL. Skin turgor WNL  Sensorium: Normal Semmes Weinstein monofilament test. Normal tactile sensation bilaterally. Nail Exam: Pt has thick disfigured discolored nails with subungual debris noted bilateral entire nail hallux through fifth toenails Ulcer Exam: There is no evidence of ulcer or pre-ulcerative changes or infection. Orthopedic Exam: Muscle tone and strength are WNL. No limitations in general ROM. No crepitus or effusions noted. Foot type and digits show no abnormalities. Bony prominences are unremarkable. Skin: No Porokeratosis. No infection or ulcers  Diagnosis:  Onychomycosis, , Pain in right toe, pain in left toes  Treatment & Plan Procedures and Treatment: Consent by patient was obtained for treatment procedures.   Debridement of mycotic and hypertrophic toenails, 1 through 5 bilateral and clearing of subungual debris. No ulceration, no infection noted.  Return Visit-Office Procedure: Patient instructed to return to the office for a follow up visit 3 months for continued evaluation and treatment.    Gardiner Barefoot DPM

## 2020-03-29 ENCOUNTER — Encounter: Payer: Self-pay | Admitting: Internal Medicine

## 2020-04-04 ENCOUNTER — Telehealth: Payer: Self-pay | Admitting: Internal Medicine

## 2020-04-04 ENCOUNTER — Telehealth: Payer: Self-pay

## 2020-04-04 DIAGNOSIS — E1159 Type 2 diabetes mellitus with other circulatory complications: Secondary | ICD-10-CM

## 2020-04-04 DIAGNOSIS — E1165 Type 2 diabetes mellitus with hyperglycemia: Secondary | ICD-10-CM

## 2020-04-04 MED ORDER — NOVOLOG FLEXPEN 100 UNIT/ML ~~LOC~~ SOPN: PEN_INJECTOR | SUBCUTANEOUS | 3 refills | Status: DC

## 2020-04-04 NOTE — Telephone Encounter (Signed)
Patient called to request that she needs this sent today if at all possible.  She has not had any Novolog since yesterday

## 2020-04-04 NOTE — Telephone Encounter (Signed)
Rx corrected and sent to preferred pharmacy.

## 2020-04-04 NOTE — Telephone Encounter (Signed)
pharmacy needing a new Rx sent in saying this for the patient    18-20 units in the morning  20-24 in the evening   NOVOLOG FLEXPEN 100 UNIT/ML FlexPen

## 2020-04-04 NOTE — Telephone Encounter (Signed)
Patient and Wells Guiles with [facility] listed below called re: PHARM listed below has not received the following RX. Patient requests a the following RX be sent asap:  MEDICATION: NOVOLOG FLEXPEN 100 UNIT/ML FlexPen  PHARMACY:    [facility] [address]), [place] Phone:  [phone]  Fax:  [phone]      HAS THE PATIENT CONTACTED [facility]? Yes  IS THIS A 90 DAY SUPPLY : If posible  IS PATIENT OUT OF MEDICATION: Yes  IF NOT; HOW MUCH IS LEFT: 0  LAST APPOINTMENT DATE: @3 /31/2022  NEXT APPOINTMENT DATE:@7 /12/2020  DO WE HAVE YOUR PERMISSION TO LEAVE A DETAILED MESSAGE?: Yes  OTHER COMMENTS:    **Let patient know to contact pharmacy at the end of the day to make sure medication is ready. **  ** Please notify patient to allow 48-72 hours to process**  **Encourage patient to contact the pharmacy for refills or they can request refills through [facility]**

## 2020-04-04 NOTE — Telephone Encounter (Signed)
RX SENT TO PREFERRED PHARMACY

## 2020-04-23 ENCOUNTER — Ambulatory Visit
Admission: RE | Admit: 2020-04-23 | Discharge: 2020-04-23 | Disposition: A | Discharge status: Home / Self Care | Payer: Medicaid Other | Source: Ambulatory Visit | Attending: Family | Admitting: Family

## 2020-04-23 ENCOUNTER — Other Ambulatory Visit: Payer: Medicaid Other

## 2020-04-23 ENCOUNTER — Ambulatory Visit (HOSPITAL_BASED_OUTPATIENT_CLINIC_OR_DEPARTMENT_OTHER): Payer: Medicaid Other | Admitting: Family

## 2020-04-23 ENCOUNTER — Other Ambulatory Visit: Payer: Self-pay

## 2020-04-23 ENCOUNTER — Encounter: Payer: Self-pay | Admitting: Family

## 2020-04-23 VITALS — BP 150/96 | HR 88 | Resp 20 | Ht 64.0 in | Wt 212.2 lb

## 2020-04-23 DIAGNOSIS — N189 Chronic kidney disease, unspecified: Secondary | ICD-10-CM | POA: Diagnosis not present

## 2020-04-23 DIAGNOSIS — J449 Chronic obstructive pulmonary disease, unspecified: Secondary | ICD-10-CM | POA: Insufficient documentation

## 2020-04-23 DIAGNOSIS — G4733 Obstructive sleep apnea (adult) (pediatric): Secondary | ICD-10-CM | POA: Insufficient documentation

## 2020-04-23 DIAGNOSIS — I5032 Chronic diastolic (congestive) heart failure: Secondary | ICD-10-CM

## 2020-04-23 DIAGNOSIS — E1122 Type 2 diabetes mellitus with diabetic chronic kidney disease: Secondary | ICD-10-CM | POA: Insufficient documentation

## 2020-04-23 DIAGNOSIS — N1832 Chronic kidney disease, stage 3b: Secondary | ICD-10-CM

## 2020-04-23 DIAGNOSIS — Z79899 Other long term (current) drug therapy: Secondary | ICD-10-CM | POA: Insufficient documentation

## 2020-04-23 DIAGNOSIS — Z9989 Dependence on other enabling machines and devices: Secondary | ICD-10-CM | POA: Insufficient documentation

## 2020-04-23 DIAGNOSIS — Z794 Long term (current) use of insulin: Secondary | ICD-10-CM | POA: Diagnosis not present

## 2020-04-23 DIAGNOSIS — Z7984 Long term (current) use of oral hypoglycemic drugs: Secondary | ICD-10-CM | POA: Insufficient documentation

## 2020-04-23 DIAGNOSIS — I10 Essential (primary) hypertension: Secondary | ICD-10-CM

## 2020-04-23 DIAGNOSIS — E785 Hyperlipidemia, unspecified: Secondary | ICD-10-CM | POA: Insufficient documentation

## 2020-04-23 DIAGNOSIS — Z7951 Long term (current) use of inhaled steroids: Secondary | ICD-10-CM | POA: Diagnosis not present

## 2020-04-23 DIAGNOSIS — Z8673 Personal history of transient ischemic attack (TIA), and cerebral infarction without residual deficits: Secondary | ICD-10-CM | POA: Diagnosis not present

## 2020-04-23 DIAGNOSIS — I130 Hypertensive heart and chronic kidney disease with heart failure and stage 1 through stage 4 chronic kidney disease, or unspecified chronic kidney disease: Secondary | ICD-10-CM | POA: Diagnosis present

## 2020-04-23 DIAGNOSIS — Z7982 Long term (current) use of aspirin: Secondary | ICD-10-CM | POA: Insufficient documentation

## 2020-04-23 DIAGNOSIS — E079 Disorder of thyroid, unspecified: Secondary | ICD-10-CM | POA: Insufficient documentation

## 2020-04-23 DIAGNOSIS — Z87891 Personal history of nicotine dependence: Secondary | ICD-10-CM | POA: Diagnosis not present

## 2020-04-23 DIAGNOSIS — I371 Nonrheumatic pulmonary valve insufficiency: Secondary | ICD-10-CM | POA: Diagnosis not present

## 2020-04-23 LAB — ECHOCARDIOGRAM COMPLETE
AR max vel: 1.9 cm2
AV Area VTI: 2.01 cm2
AV Area mean vel: 2.08 cm2
AV Mean grad: 4 mmHg
AV Peak grad: 8.8 mmHg
Ao pk vel: 1.48 m/s
Area-P 1/2: 9.85 cm2
Calc EF: 57.5 %
MV VTI: 2.1 cm2
S' Lateral: 4.3 cm
Single Plane A2C EF: 57.5 %
Single Plane A4C EF: 64.7 %

## 2020-04-23 MED ORDER — PERFLUTREN LIPID MICROSPHERE
1.0000 mL | INTRAVENOUS | Status: AC | PRN
  Administered 2020-04-23: 2 mL via INTRAVENOUS
  Filled 2020-04-23: qty 10

## 2020-04-23 NOTE — Progress Notes (Signed)
*  PRELIMINARY RESULTS* Echocardiogram 2D Echocardiogram has been performed.  Emily [name] 04/23/2020, 11:05 AM

## 2020-04-23 NOTE — Patient Instructions (Addendum)
Resume weighing daily and call for an overnight weight gain of > 2 pounds or a weekly weight gain of >5 pounds. 

## 2020-04-23 NOTE — Progress Notes (Signed)
Patient ID: Emily [name], female    DOB: [birth year], 60 y.o.   MRN: [id number]  HPI  Emily [name] is a 60 y/o female with a history of asthma, DM, hyperlipidemia, HTN, stroke, thyroid disease, COPD, sleep apnea, former tobacco use and chronic heart failure.   Echo report from 02/13/2018 reviewed and showed an EF of 60-65% without LVH.    Has not been admitted or been in the ED in the last 6 months.    She presents today for a follow-up visit with a chief complaint of moderate fatigue upon minimal exertion. She describes this as chronic in nature having been present for several years. She has associated shortness of breath, pedal edema, chronic pain and slight weight gain along with this. She denies any difficulty sleeping, dizziness, abdominal distention, palpitations, chest pain, cough or weight gain.   Also mentions that she has a "knot" on the inside of her left knee above the kneecap. She says that this has been present for "a long time" and that it occasionally hurts.   Amlodipine has been stopped and HCTZ has been started since she was last here.    Past Medical History:  Diagnosis Date  . Arthritis    hands, all over  . Asthma   . CHF (congestive heart failure) ([place])   . COPD (chronic obstructive pulmonary disease) (HCC)    on 2L home o2  . Diabetes mellitus without complication ([place])    type 2  . Dyspnea   . Hyperlipidemia   . Hypertension   . Sleep apnea    CPAP  . Stroke [facility])    Was told at recent hospital visit she has stroke in past  . Thyroid disease   . Thyroid nodule 04/30/2016   Past Surgical History:  Procedure Laterality Date  . arm surgery Left   . COLONOSCOPY WITH PROPOFOL N/A 02/14/2019   Procedure: COLONOSCOPY WITH PROPOFOL;  Surgeon: [name], MD;  Location: [facility] ENDOSCOPY;  Service: Endoscopy;  Laterality: N/A;   Family History  Problem Relation Age of Onset  . Diabetes Mother   . Hypertension Mother   . Diabetes Father   . Hypertension  Father   . CAD Father   . Diabetes Sister    Social History   Tobacco Use  . Smoking status: Former Smoker    Packs/day: 1.50    Years: 40.00    Pack years: 60.00    Types: Cigarettes    Quit date: 01/13/2016    Years since quitting: 4.2  . Smokeless tobacco: Never Used  Substance Use Topics  . Alcohol use: No   No Known Allergies  Prior to Admission medications   Medication Sig Start Date End Date Taking? Authorizing Provider  Accu-Chek Softclix Lancets lancets Use as instructed to check blood sugar 3 times a day. E11.59 09/05/19  Yes Philemon Kingdom, MD  Acetylcysteine (NAC) 600 MG CAPS Take by mouth daily.   Yes [provider]  albuterol (PROVENTIL HFA;VENTOLIN HFA) 108 (90 Base) MCG/ACT inhaler Inhale 1-2 puffs into the lungs every 6 (six) hours as needed for wheezing or shortness of breath. 07/16/16  Yes Doles-Johnson, Teah, NP  albuterol (PROVENTIL) (2.5 MG/3ML) 0.083% nebulizer solution Take 3 mLs (2.5 mg total) by nebulization every 6 (six) hours as needed for wheezing or shortness of breath. 11/18/17  Yes Wieting, Richard, MD  Armodafinil 150 MG tablet Take 150 mg by mouth daily.   Yes [provider]  aspirin EC 81 MG tablet Take  81 mg by mouth daily.   Yes [provider]  atorvastatin (LIPITOR) 40 MG tablet TAKE ONE TABLET BY MOUTH EVERY DAY Patient taking differently: Take 40 mg by mouth at bedtime. 04/21/16  Yes Tawni Millers, MD  BD PEN NEEDLE MICRO U/F 32G X 6 MM MISC SMARTSIG:Pre-Filled Pen Syringe Injection 3 Times Daily 09/22/19  Yes [provider]  Blood Glucose Monitoring Suppl (ACCU-CHEK GUIDE) w/Device KIT Use as instructed to check blood sugar 3 times a day. E11.59 09/05/19  Yes Philemon Kingdom, MD  budesonide-formoterol [facility]) 80-4.5 MCG/ACT inhaler Inhale 2 puffs into the lungs 2 (two) times daily. 11/18/17  Yes Loletha Grayer, MD  carvedilol (COREG) 25 MG tablet Take 1 tablet by mouth twice daily 05/06/19  Yes  Bucky Grigg A, FNP  ergocalciferol (VITAMIN D2) 1.25 MG (50000 UT) capsule Take by mouth. 05/24/19 05/23/20 Yes [provider]  esomeprazole (NEXIUM) 40 MG capsule Take 40 mg by mouth daily. am   Yes [provider]  fluticasone (FLONASE) 50 MCG/ACT nasal spray Place 1 spray into both nostrils daily.   Yes [provider]  glucose blood (ACCU-CHEK GUIDE) test strip Use as instructed to check blood sugar 3 times a day. E11.59 09/05/19  Yes Philemon Kingdom, MD  hydrochlorothiazide (HYDRODIURIL) 50 MG tablet Take 50 mg by mouth daily.   Yes [provider]  insulin glargine (LANTUS SOLOSTAR) 100 UNIT/ML Solostar Pen INJECT 30 UNITS SUBCUTANEOUSLY IN THE MORNING AND 30 UNITS AT NIGHT 03/12/20  Yes Philemon Kingdom, MD  ipratropium (ATROVENT) 0.06 % nasal spray Place 2 sprays into both nostrils 4 (four) times daily.   Yes [provider]  methimazole (TAPAZOLE) 5 MG tablet Take 0.5 tablets (2.5 mg total) by mouth daily. 10/05/19  Yes Philemon Kingdom, MD  NOVOLOG FLEXPEN 100 UNIT/ML FlexPen INJECT 18-22 UNITS IN THE AM AND 20-24 UNITS IN THE EVENING before meals 04/04/20  Yes Philemon Kingdom, MD  potassium chloride (KLOR-CON) 10 MEQ tablet Take by mouth. 12/13/19 12/12/20 Yes [provider]  Semaglutide, 1 MG/DOSE, (OZEMPIC, 1 MG/DOSE,) 4 MG/3ML SOPN Inject 1 mg into the skin once a week. 10/03/19  Yes Philemon Kingdom, MD  tiotropium (SPIRIVA) 18 MCG inhalation capsule Place 18 mcg into inhaler and inhale daily.   Yes [provider]  amLODipine (NORVASC) 10 MG tablet Take 10 mg by mouth daily. am Patient not taking: Reported on 04/23/2020    [provider]    Review of Systems  Constitutional: Positive for fatigue (easily). Negative for appetite change.  HENT: Negative for congestion, postnasal drip and sore throat.   Eyes: Positive for visual disturbance ("blurry vision").  Respiratory: Positive for shortness of breath (with  moderate exertion). Negative for cough and wheezing.   Cardiovascular: Positive for leg swelling. Negative for chest pain and palpitations.  Gastrointestinal: Negative for abdominal distention and abdominal pain.  Endocrine: Negative.   Genitourinary: Negative.   Musculoskeletal: Positive for arthralgias (left leg pain at time) and back pain (at times). Negative for neck pain.  Skin: Negative.   Allergic/Immunologic: Negative.   Neurological: Negative for dizziness, light-headedness and headaches.  Hematological: Negative for adenopathy. Does not bruise/bleed easily.  Psychiatric/Behavioral: Negative for dysphoric mood and sleep disturbance (sleeping on 1 pillow). The patient is not nervous/anxious.    Vitals:   04/23/20 1122 04/23/20 1137  BP: (!) 182/102 (!) 150/96  Pulse: 88   Resp: 20   SpO2: 94%   Weight: 212 lb 4 oz (96.3 kg)  Height: _0  (1.626 m)    Wt Readings from Last 3 Encounters:  04/23/20 212 lb 4 oz (96.3 kg)  03/12/20 216 lb 9.6 oz (98.2 kg)  01/31/20 203 lb 6 oz (92.3 kg)   Lab Results  Component Value Date   CREATININE 2.65 (H) 04/25/2019   CREATININE 1.87 (H) 04/05/2018   CREATININE 2.46 (H) 02/18/2018    Physical Exam Vitals and nursing note reviewed.  Constitutional:      Appearance: Normal appearance.  HENT:     Head: Normocephalic and atraumatic.  Cardiovascular:     Rate and Rhythm: Normal rate and regular rhythm.  Pulmonary:     Effort: Pulmonary effort is normal. No respiratory distress.     Breath sounds: No wheezing or rales.  Abdominal:     General: There is no distension.     Palpations: Abdomen is soft.  Musculoskeletal:        General: No tenderness.     Cervical back: Normal range of motion and neck supple.     Left knee: Swelling (medial left knee) present.     Right lower leg: No edema.     Left lower leg: No edema.  Skin:    General: Skin is warm and dry.  Neurological:     General: No focal deficit present.     Mental  Status: She is alert and oriented to person, place, and time.  Psychiatric:        Mood and Affect: Mood normal.        Behavior: Behavior normal.        Thought Content: Thought content normal.    Assessment & Plan:  1: Chronic heart failure with preserved ejection fraction- - NYHA class III - euvolemic today - hasn't been weighing daily; encouraged to resume daily weighing and reminded to call for an overnight weight gain of >2 pounds or a weekly weight gain of >5 pounds - weight up 9 pounds from last visit here 3 months ago - not adding salt and has been using Mrs. Dash for seasoning. - wearing compression socks daily with removal at bedtime along with compression pumps that she uses "sometimes" - echo completed earlier today - saw vascular (Schnier) 12/18/19 - BNP 04/25/19 was 275.0 - says that she's received both her COVID vaccines  2: HTN- - BP initially elevated (182/102) and had improved upon recheck with manual cuff at the end of the visit (150/96) - has had amlodipine stopped and HCTZ started since last visit - saw PCP Cruzita Lederer) 03/12/20 - BMP from 04/19/20 reviewed and showed sodium 142, potassium 3.1, creatinine 2.41 and GFR 24  3: COPD- - saw pulmonology Lanney Gins) 04/04/20  4: Obstructive sleep apnea- - wearing CPAP nightly - now back on nuvigil and she says that she feels better since resuming it  5: DM with CKD- - fasting glucose at home today was 157 - A1c 03/12/20 was 8.2% - saw endocrinology Cruzita Lederer) 12/12/19 - saw nephrology (Menefee) 12/13/19   Medication bottles were reviewed. Advised patient to follow-up with PCP regarding knot on the medial side above her left knee.   Return in 3 months or sooner for any questions/problems before then.

## 2020-04-25 ENCOUNTER — Other Ambulatory Visit: Payer: Self-pay | Admitting: Family

## 2020-05-07 ENCOUNTER — Ambulatory Visit
Admission: RE | Admit: 2020-05-07 | Discharge: 2020-05-07 | Disposition: A | Discharge status: Home / Self Care | Payer: Medicaid Other | Source: Ambulatory Visit | Attending: Internal Medicine | Admitting: Internal Medicine

## 2020-05-07 ENCOUNTER — Other Ambulatory Visit: Payer: Self-pay | Admitting: Physician Assistant

## 2020-05-07 DIAGNOSIS — E041 Nontoxic single thyroid nodule: Secondary | ICD-10-CM

## 2020-05-07 DIAGNOSIS — N184 Chronic kidney disease, stage 4 (severe): Secondary | ICD-10-CM

## 2020-05-08 ENCOUNTER — Telehealth: Payer: Self-pay | Admitting: Internal Medicine

## 2020-05-08 ENCOUNTER — Telehealth: Payer: Self-pay

## 2020-05-08 NOTE — Telephone Encounter (Addendum)
Left message for pt to call back. ----- Message from Philemon Kingdom, MD sent at 05/07/2020  5:55 PM EDT ----- Can you please call pt: Thyroid ultrasound results are back and it shows stability of her nodules.  No intervention is needed for now but we will plan to repeat the thyroid ultrasound in another year or 2.

## 2020-05-10 NOTE — Telephone Encounter (Signed)
No, this does not meet patient safety standards.

## 2020-05-10 NOTE — Telephone Encounter (Signed)
[facility] called to let us know Dr. Cruzita Lederer is not covered under medicaid and so they will need a verbal order or order from another provider so that pt can get this medication.  Callback # [phone] ask for Elta Guadeloupe or any pharmacist  (phones open at [facility] [address] ([address]), [place] - [place] Phone:  [phone]  Fax:  [phone]

## 2020-05-10 NOTE — Telephone Encounter (Signed)
Dr Solmon Ice to approve with your information?

## 2020-05-10 NOTE — Telephone Encounter (Signed)
I am covered under [place].Marland KitchenMarland Kitchen

## 2020-05-13 NOTE — Telephone Encounter (Signed)
T, I remember Lenna Sciara looked into this, and determined that I am, indeed, covered... However, I cannot remember for which patient this problem occurred in the past.

## 2020-05-13 NOTE — Telephone Encounter (Signed)
Emily [name] is looking into this error. I will follow up.

## 2020-05-13 NOTE — Telephone Encounter (Signed)
I called and spoke with the pharmacy and they said it was running through. All seems right. Thank you.

## 2020-05-13 NOTE — Telephone Encounter (Signed)
Sorry, I can't do it.

## 2020-05-16 ENCOUNTER — Ambulatory Visit
Admission: RE | Admit: 2020-05-16 | Discharge: 2020-05-16 | Disposition: A | Discharge status: Home / Self Care | Payer: Medicaid Other | Source: Ambulatory Visit | Attending: Physician Assistant | Admitting: Physician Assistant

## 2020-05-16 ENCOUNTER — Ambulatory Visit
Admission: RE | Admit: 2020-05-16 | Discharge: 2020-05-16 | Disposition: A | Discharge status: Home / Self Care | Payer: Medicaid Other | Attending: Physician Assistant | Admitting: Physician Assistant

## 2020-05-16 ENCOUNTER — Other Ambulatory Visit: Payer: Self-pay

## 2020-05-16 DIAGNOSIS — N184 Chronic kidney disease, stage 4 (severe): Secondary | ICD-10-CM | POA: Insufficient documentation

## 2020-07-04 ENCOUNTER — Other Ambulatory Visit: Payer: Self-pay

## 2020-07-04 ENCOUNTER — Ambulatory Visit (INDEPENDENT_AMBULATORY_CARE_PROVIDER_SITE_OTHER): Payer: Medicaid Other | Admitting: Podiatry

## 2020-07-04 ENCOUNTER — Encounter: Payer: Self-pay | Admitting: Podiatry

## 2020-07-04 DIAGNOSIS — N189 Chronic kidney disease, unspecified: Secondary | ICD-10-CM

## 2020-07-04 DIAGNOSIS — E1159 Type 2 diabetes mellitus with other circulatory complications: Secondary | ICD-10-CM

## 2020-07-04 DIAGNOSIS — M79675 Pain in left toe(s): Secondary | ICD-10-CM

## 2020-07-04 DIAGNOSIS — B351 Tinea unguium: Secondary | ICD-10-CM | POA: Diagnosis not present

## 2020-07-04 DIAGNOSIS — I872 Venous insufficiency (chronic) (peripheral): Secondary | ICD-10-CM

## 2020-07-04 DIAGNOSIS — M79674 Pain in right toe(s): Secondary | ICD-10-CM | POA: Diagnosis not present

## 2020-07-04 NOTE — Progress Notes (Signed)
Complaint:  Visit Type: Patient returns to my office for continued preventative foot care services. Complaint: Patient states" my nails have grown long and thick and become painful to walk and wear shoes" Patient has been diagnosed with DM with vascular disease and chronic venous insufficiency and chronic kidney disease.. The patient presents for preventative foot care services.  Podiatric Exam: Vascular: dorsalis pedis and posterior tibial pulses are weakly  palpable bilateral. Capillary return is immediate. Temperature gradient is WNL. Skin turgor WNL  Sensorium: Normal Semmes Weinstein monofilament test. Normal tactile sensation bilaterally. Nail Exam: Pt has thick disfigured discolored nails with subungual debris noted bilateral entire nail hallux through fifth toenails Ulcer Exam: There is no evidence of ulcer or pre-ulcerative changes or infection. Orthopedic Exam: Muscle tone and strength are WNL. No limitations in general ROM. No crepitus or effusions noted. Foot type and digits show no abnormalities. Bony prominences are unremarkable. Skin: No Porokeratosis. No infection or ulcers  Diagnosis:  Onychomycosis, , Pain in right toe, pain in left toes  Treatment & Plan Procedures and Treatment: Consent by patient was obtained for treatment procedures.   Debridement of mycotic and hypertrophic toenails, 1 through 5 bilateral and clearing of subungual debris. No ulceration, no infection noted.  Return Visit-Office Procedure: Patient instructed to return to the office for a follow up visit 3 months for continued evaluation and treatment.    Gardiner Barefoot DPM

## 2020-07-16 ENCOUNTER — Ambulatory Visit: Payer: Medicaid Other | Admitting: Internal Medicine

## 2020-07-22 NOTE — Progress Notes (Signed)
Patient ID: Emily [name], female    DOB: 12/27/1960, 60 y.o.   MRN: [id number]  HPI  Emily [name] is a 60 y/o female with a history of asthma, DM, hyperlipidemia, HTN, stroke, thyroid disease, COPD, sleep apnea, former tobacco use and chronic heart failure.   Echo report from 04/23/20 reviewed and showed an EF of 50-55% with mild LVH. Echo report from 02/13/2018 reviewed and showed an EF of 60-65% without LVH.    Has not been admitted or been in the ED in the last 6 months.    She presents today for a follow-up visit with a chief complaint of moderate shortness of breath upon minimal exertion. She describes this as chronic in nature having been present for several years. She has associated fatigue & palpitations along with this. She denies any difficulty sleeping, dizziness, abdominal distention, pedal edema, chest pain, cough or weight gain.   Hasn't been weighing herself daily but does have scales at home.   Past Medical History:  Diagnosis Date   Arthritis    hands, all over   Asthma    CHF (congestive heart failure) (HCC)    COPD (chronic obstructive pulmonary disease) (HCC)    on 2L home o2   Diabetes mellitus without complication (HCC)    type 2   Dyspnea    Hyperlipidemia    Hypertension    Sleep apnea    CPAP   Stroke [facility])    Was told at recent hospital visit she has stroke in past   Thyroid disease    Thyroid nodule 04/30/2016   Past Surgical History:  Procedure Laterality Date   arm surgery Left    COLONOSCOPY WITH PROPOFOL N/A 02/14/2019   Procedure: COLONOSCOPY WITH PROPOFOL;  Surgeon: [name], MD;  Location: [facility] ENDOSCOPY;  Service: Endoscopy;  Laterality: N/A;   Family History  Problem Relation Age of Onset   Diabetes Mother    Hypertension Mother    Diabetes Father    Hypertension Father    CAD Father    Diabetes Sister    Social History   Tobacco Use   Smoking status: Former    Packs/day: 1.50    Years: 40.00    Pack years: 60.00    Types:  Cigarettes    Quit date: 01/13/2016    Years since quitting: 4.5   Smokeless tobacco: Never  Substance Use Topics   Alcohol use: No   No Known Allergies  Prior to Admission medications   Medication Sig Start Date End Date Taking? Authorizing Provider  Accu-Chek Softclix Lancets lancets Use as instructed to check blood sugar 3 times a day. E11.59 09/05/19  Yes Philemon Kingdom, MD  Acetylcysteine (NAC) 600 MG CAPS Take by mouth daily.   Yes [provider]  albuterol (PROVENTIL HFA;VENTOLIN HFA) 108 (90 Base) MCG/ACT inhaler Inhale 1-2 puffs into the lungs every 6 (six) hours as needed for wheezing or shortness of breath. 07/16/16  Yes Doles-Johnson, Teah, NP  albuterol (PROVENTIL) (2.5 MG/3ML) 0.083% nebulizer solution Take 3 mLs (2.5 mg total) by nebulization every 6 (six) hours as needed for wheezing or shortness of breath. 11/18/17  Yes Wieting, Richard, MD  amLODipine (NORVASC) 2.5 MG tablet Take 2.5 mg by mouth daily.   Yes [provider]  Armodafinil 150 MG tablet Take 150 mg by mouth daily.   Yes [provider]  aspirin EC 81 MG tablet Take 81 mg by mouth daily.   Yes [provider]  atorvastatin (LIPITOR)  40 MG tablet TAKE ONE TABLET BY MOUTH EVERY DAY Patient taking differently: Take 40 mg by mouth at bedtime. 04/21/16  Yes Tawni Millers, MD  BD PEN NEEDLE MICRO U/F 32G X 6 MM MISC SMARTSIG:Pre-Filled Pen Syringe Injection 3 Times Daily 09/22/19  Yes [provider]  Blood Glucose Monitoring Suppl (ACCU-CHEK GUIDE) w/Device KIT Use as instructed to check blood sugar 3 times a day. E11.59 09/05/19  Yes Philemon Kingdom, MD  budesonide-formoterol [facility]) 80-4.5 MCG/ACT inhaler Inhale 2 puffs into the lungs 2 (two) times daily. 11/18/17  Yes Loletha Grayer, MD  carvedilol (COREG) 25 MG tablet Take 1 tablet by mouth twice daily 04/25/20  Yes Kajuana Shareef A, FNP  chlorthalidone (HYGROTON) 25 MG tablet Take 25 mg by mouth daily.   Yes  [provider]  esomeprazole (NEXIUM) 40 MG capsule Take 40 mg by mouth daily. am   Yes [provider]  fluticasone (FLONASE) 50 MCG/ACT nasal spray Place 1 spray into both nostrils daily.   Yes [provider]  glucose blood (ACCU-CHEK GUIDE) test strip Use as instructed to check blood sugar 3 times a day. E11.59 09/05/19  Yes Philemon Kingdom, MD  hydrochlorothiazide (HYDRODIURIL) 50 MG tablet Take 50 mg by mouth daily.   Yes [provider]  insulin glargine (LANTUS SOLOSTAR) 100 UNIT/ML Solostar Pen INJECT 30 UNITS SUBCUTANEOUSLY IN THE MORNING AND 30 UNITS AT NIGHT 03/12/20  Yes Philemon Kingdom, MD  ipratropium (ATROVENT) 0.06 % nasal spray Place 2 sprays into both nostrils 4 (four) times daily.   Yes [provider]  methimazole (TAPAZOLE) 5 MG tablet Take 1 tablet by mouth once daily 05/08/20  Yes Philemon Kingdom, MD  NOVOLOG FLEXPEN 100 UNIT/ML FlexPen INJECT 18-22 UNITS IN THE AM AND 20-24 UNITS IN THE EVENING before meals 04/04/20  Yes Philemon Kingdom, MD  potassium chloride (KLOR-CON) 10 MEQ tablet Take by mouth 2 (two) times daily. 12/13/19 12/12/20 Yes [provider]  Semaglutide, 1 MG/DOSE, (OZEMPIC, 1 MG/DOSE,) 4 MG/3ML SOPN Inject 1 mg into the skin once a week. 10/03/19  Yes Philemon Kingdom, MD  tiotropium (SPIRIVA) 18 MCG inhalation capsule Place 18 mcg into inhaler and inhale daily.   Yes [provider]  Vitamin D, Ergocalciferol, (DRISDOL) 1.25 MG (50000 UNIT) CAPS capsule Take 50,000 Units by mouth every 7 (seven) days.   Yes [provider]    Review of Systems  Constitutional:  Positive for fatigue (easily). Negative for appetite change.  HENT:  Negative for congestion, postnasal drip and sore throat.   Eyes: Negative.   Respiratory:  Positive for shortness of breath (easily). Negative for cough and chest tightness.   Cardiovascular:  Positive for palpitations. Negative for chest pain and leg  swelling.  Gastrointestinal:  Negative for abdominal distention and abdominal pain.  Endocrine: Negative.   Genitourinary: Negative.   Musculoskeletal:  Negative for back pain and neck pain.  Skin: Negative.   Allergic/Immunologic: Negative.   Neurological:  Negative for dizziness and light-headedness.  Hematological:  Negative for adenopathy. Does not bruise/bleed easily.  Psychiatric/Behavioral:  Negative for dysphoric mood and sleep disturbance (sleeping on 1 pillow). The patient is not nervous/anxious.    Vitals:   07/23/20 0919  BP: (!) 145/79  Pulse: 91  Resp: 18  SpO2: 95%  Weight: 214 lb 8 oz (97.3 kg)  Height: _0  (1.626 m)   Wt Readings from Last 3 Encounters:  07/23/20 214 lb 8 oz (97.3 kg)  04/23/20 212 lb 4  oz (96.3 kg)  03/12/20 216 lb 9.6 oz (98.2 kg)   Lab Results  Component Value Date   CREATININE 2.65 (H) 04/25/2019   CREATININE 1.87 (H) 04/05/2018   CREATININE 2.46 (H) 02/18/2018   Physical Exam Vitals and nursing note reviewed. Exam conducted with a chaperone present.  Constitutional:      Appearance: Normal appearance.  HENT:     Head: Normocephalic and atraumatic.  Cardiovascular:     Rate and Rhythm: Normal rate and regular rhythm.  Pulmonary:     Effort: Pulmonary effort is normal. No respiratory distress.     Breath sounds: No wheezing or rales.  Abdominal:     General: There is no distension.     Palpations: Abdomen is soft.     Tenderness: There is no abdominal tenderness.  Musculoskeletal:        General: No tenderness.     Cervical back: Normal range of motion and neck supple.     Left knee: Swelling (medial left knee) present.     Right lower leg: No edema.     Left lower leg: No edema.  Skin:    General: Skin is warm and dry.  Neurological:     General: No focal deficit present.     Mental Status: She is alert and oriented to person, place, and time.  Psychiatric:        Mood and Affect: Mood normal.        Behavior: Behavior  normal.        Thought Content: Thought content normal.   Assessment & Plan:  1: Chronic heart failure with preserved ejection fraction with structural changes (LVH)-  - NYHA class III - euvolemic today - hasn't been weighing daily; encouraged to resume daily weighing and reminded to call for an overnight weight gain of >2 pounds or a weekly weight gain of >5 pounds - weight up 2 pounds from last visit here 3 months ago - not adding salt and has been using Mrs. Dash for seasoning. - wearing compression socks daily with removal at bedtime along with compression pumps that she uses "sometimes" - unable to use entresto or SLGT2 due to renal function - saw vascular (Schnier) 12/18/19 - BNP 04/25/19 was 275.0  2: HTN- - BP mildly elevated today (145/79) - saw PCP Cruzita Lederer) 03/12/20 - CMP from 07/10/20 reviewed and showed sodium 144, potassium 3.3, creatinine 3.02 and GFR 17  3: COPD- - saw pulmonology Lanney Gins) 04/04/20 - wearing oxygen at 4L around the clock at home; did not bring with her today  4: Obstructive sleep apnea- - wearing CPAP nightly - continues nuvigil and she says that she feels better since resuming it  5: DM with CKD- - fasting glucose at home today was "high"; says that she ate homemade cherry pie yesterday evening - A1c 03/12/20 was 8.2% - saw nephrology (Menefee) 04/24/20; returns tomorrow and says that she's recently had lab work done   Medication bottles reviewed.   Return in 6 months or sooner for any questions/problems before then.

## 2020-07-23 ENCOUNTER — Other Ambulatory Visit: Payer: Self-pay

## 2020-07-23 ENCOUNTER — Ambulatory Visit: Payer: Medicaid Other | Attending: Family | Admitting: Family

## 2020-07-23 ENCOUNTER — Encounter: Payer: Self-pay | Admitting: Family

## 2020-07-23 VITALS — BP 145/79 | HR 91 | Resp 18 | Ht 64.0 in | Wt 214.5 lb

## 2020-07-23 DIAGNOSIS — Z8673 Personal history of transient ischemic attack (TIA), and cerebral infarction without residual deficits: Secondary | ICD-10-CM | POA: Insufficient documentation

## 2020-07-23 DIAGNOSIS — E1122 Type 2 diabetes mellitus with diabetic chronic kidney disease: Secondary | ICD-10-CM

## 2020-07-23 DIAGNOSIS — N189 Chronic kidney disease, unspecified: Secondary | ICD-10-CM | POA: Insufficient documentation

## 2020-07-23 DIAGNOSIS — G4733 Obstructive sleep apnea (adult) (pediatric): Secondary | ICD-10-CM | POA: Diagnosis not present

## 2020-07-23 DIAGNOSIS — Z87891 Personal history of nicotine dependence: Secondary | ICD-10-CM | POA: Diagnosis not present

## 2020-07-23 DIAGNOSIS — E079 Disorder of thyroid, unspecified: Secondary | ICD-10-CM | POA: Insufficient documentation

## 2020-07-23 DIAGNOSIS — Z7982 Long term (current) use of aspirin: Secondary | ICD-10-CM | POA: Diagnosis not present

## 2020-07-23 DIAGNOSIS — I130 Hypertensive heart and chronic kidney disease with heart failure and stage 1 through stage 4 chronic kidney disease, or unspecified chronic kidney disease: Secondary | ICD-10-CM | POA: Insufficient documentation

## 2020-07-23 DIAGNOSIS — E785 Hyperlipidemia, unspecified: Secondary | ICD-10-CM | POA: Diagnosis not present

## 2020-07-23 DIAGNOSIS — J449 Chronic obstructive pulmonary disease, unspecified: Secondary | ICD-10-CM | POA: Diagnosis not present

## 2020-07-23 DIAGNOSIS — Z8249 Family history of ischemic heart disease and other diseases of the circulatory system: Secondary | ICD-10-CM | POA: Insufficient documentation

## 2020-07-23 DIAGNOSIS — Z794 Long term (current) use of insulin: Secondary | ICD-10-CM

## 2020-07-23 DIAGNOSIS — Z79899 Other long term (current) drug therapy: Secondary | ICD-10-CM | POA: Insufficient documentation

## 2020-07-23 DIAGNOSIS — I10 Essential (primary) hypertension: Secondary | ICD-10-CM

## 2020-07-23 DIAGNOSIS — I5032 Chronic diastolic (congestive) heart failure: Secondary | ICD-10-CM

## 2020-07-23 DIAGNOSIS — Z7951 Long term (current) use of inhaled steroids: Secondary | ICD-10-CM | POA: Insufficient documentation

## 2020-07-23 DIAGNOSIS — N184 Chronic kidney disease, stage 4 (severe): Secondary | ICD-10-CM

## 2020-07-23 NOTE — Patient Instructions (Signed)
Continue weighing daily and call for an overnight weight gain of > 2 pounds or a weekly weight gain of >5 pounds. 

## 2020-08-10 ENCOUNTER — Emergency Department
Admission: EM | Admit: 2020-08-10 | Discharge: 2020-08-10 | Disposition: A | Discharge status: Home / Self Care | Payer: Medicaid Other | Attending: Emergency Medicine | Admitting: Emergency Medicine

## 2020-08-10 ENCOUNTER — Other Ambulatory Visit: Payer: Self-pay

## 2020-08-10 DIAGNOSIS — N189 Chronic kidney disease, unspecified: Secondary | ICD-10-CM | POA: Diagnosis not present

## 2020-08-10 DIAGNOSIS — M542 Cervicalgia: Secondary | ICD-10-CM | POA: Insufficient documentation

## 2020-08-10 DIAGNOSIS — I509 Heart failure, unspecified: Secondary | ICD-10-CM | POA: Diagnosis not present

## 2020-08-10 DIAGNOSIS — J449 Chronic obstructive pulmonary disease, unspecified: Secondary | ICD-10-CM | POA: Diagnosis not present

## 2020-08-10 DIAGNOSIS — J45909 Unspecified asthma, uncomplicated: Secondary | ICD-10-CM | POA: Insufficient documentation

## 2020-08-10 DIAGNOSIS — M79602 Pain in left arm: Secondary | ICD-10-CM | POA: Insufficient documentation

## 2020-08-10 DIAGNOSIS — Z79899 Other long term (current) drug therapy: Secondary | ICD-10-CM | POA: Diagnosis not present

## 2020-08-10 DIAGNOSIS — Z87891 Personal history of nicotine dependence: Secondary | ICD-10-CM | POA: Diagnosis not present

## 2020-08-10 DIAGNOSIS — I130 Hypertensive heart and chronic kidney disease with heart failure and stage 1 through stage 4 chronic kidney disease, or unspecified chronic kidney disease: Secondary | ICD-10-CM | POA: Insufficient documentation

## 2020-08-10 DIAGNOSIS — E1122 Type 2 diabetes mellitus with diabetic chronic kidney disease: Secondary | ICD-10-CM | POA: Diagnosis not present

## 2020-08-10 DIAGNOSIS — E1169 Type 2 diabetes mellitus with other specified complication: Secondary | ICD-10-CM | POA: Insufficient documentation

## 2020-08-10 DIAGNOSIS — Z794 Long term (current) use of insulin: Secondary | ICD-10-CM | POA: Diagnosis not present

## 2020-08-10 DIAGNOSIS — Z7982 Long term (current) use of aspirin: Secondary | ICD-10-CM | POA: Diagnosis not present

## 2020-08-10 DIAGNOSIS — E785 Hyperlipidemia, unspecified: Secondary | ICD-10-CM | POA: Insufficient documentation

## 2020-08-10 MED ORDER — TIZANIDINE HCL 4 MG PO TABS: 4.0000 mg | ORAL_TABLET | Freq: Three times a day (TID) | ORAL | 0 refills | Status: DC

## 2020-08-10 MED ORDER — NAPROXEN 500 MG PO TABS: 500.0000 mg | ORAL_TABLET | Freq: Two times a day (BID) | ORAL | 0 refills | Status: DC

## 2020-08-10 MED ORDER — CYCLOBENZAPRINE HCL 10 MG PO TABS
5.0000 mg | ORAL_TABLET | Freq: Once | ORAL | Status: AC
  Administered 2020-08-10: 5 mg via ORAL
  Filled 2020-08-10: qty 1

## 2020-08-10 MED ORDER — KETOROLAC TROMETHAMINE 60 MG/2ML IM SOLN
30.0000 mg | Freq: Once | INTRAMUSCULAR | Status: AC
  Administered 2020-08-10: 30 mg via INTRAMUSCULAR
  Filled 2020-08-10: qty 2

## 2020-08-10 NOTE — ED Triage Notes (Signed)
Pt states she has been having pain in her left arm since Wednesday- pt denies recent injury- pt has tried compression and OTC meds with no relief

## 2020-08-10 NOTE — Discharge Instructions (Addendum)
Take your medication as prescribed.  Follow up with primary care if not improving over the week.

## 2020-08-10 NOTE — ED Provider Notes (Signed)
[facility] Emergency Department Provider Note ____________________________________________  Time seen: Approximately 4:07 PM  I have reviewed the triage vital signs and the nursing notes.   HISTORY  Chief Complaint Arm Pain    HPI Emily [name] is a 60 y.o. female who presents to the emergency department for evaluation and treatment of pain in the left posterior-lateral neck pain that extends from the neck over the top of the shoulder down into the upper arm and down into the forearm.  Symptoms started 3 days ago upon awakening.  No improvement with compression and BC powders.  No known injury.  Denies chest pain, shortness of breath, weakness, or previous symptoms similar to today.  Past Medical History:  Diagnosis Date   Arthritis    hands, all over   Asthma    CHF (congestive heart failure) (HCC)    COPD (chronic obstructive pulmonary disease) ([place])    on 2L home o2   Diabetes mellitus without complication ([place])    type 2   Dyspnea    Hyperlipidemia    Hypertension    Sleep apnea    CPAP   Stroke [facility])    Was told at recent hospital visit she has stroke in past   Thyroid disease    Thyroid nodule 04/30/2016    Patient Active Problem List   Diagnosis Date Noted   Lymphedema 07/10/2019   Pain due to onychomycosis of toenails of both feet 03/09/2019   Type 2 diabetes mellitus with vascular disease ([place]) 03/09/2019   Chronic venous insufficiency 02/12/2019   Hyperlipidemia 02/12/2019   COPD (chronic obstructive pulmonary disease) ([place]) 02/21/2018   Sleep apnea 02/21/2018   CHF (congestive heart failure) ([place]) 02/12/2018   Puerperal sepsis with acute hypoxic respiratory failure ([place]) 11/12/2017   Leg pain, bilateral 12/17/2016   Thyroid nodule 04/30/2016   Left sided numbness 04/27/2016   Muscle spasm of back 11/05/2015   Hyperthyroidism 03/13/2015   Goiter diffuse 03/13/2015   Poorly controlled type 2 diabetes mellitus with circulatory  disorder ([place]) 02/07/2015   Essential hypertension 12/27/2014   Chronic kidney disease 12/27/2014   Tobacco abuse 12/27/2014    Past Surgical History:  Procedure Laterality Date   arm surgery Left    COLONOSCOPY WITH PROPOFOL N/A 02/14/2019   Procedure: COLONOSCOPY WITH PROPOFOL;  Surgeon: [name], MD;  Location: [facility] ENDOSCOPY;  Service: Endoscopy;  Laterality: N/A;    Prior to Admission medications   Medication Sig Start Date End Date Taking? Authorizing Provider  naproxen (NAPROSYN) 500 MG tablet Take 1 tablet (500 mg total) by mouth 2 (two) times daily with a meal. 08/10/20  Yes Maurianna Benard B, FNP  tiZANidine (ZANAFLEX) 4 MG tablet Take 1 tablet (4 mg total) by mouth 3 (three) times daily. 08/10/20  Yes Estelle Skibicki B, FNP  Accu-Chek Softclix Lancets lancets Use as instructed to check blood sugar 3 times a day. E11.59 09/05/19   Philemon Kingdom, MD  Acetylcysteine (NAC) 600 MG CAPS Take by mouth daily.    [provider]  albuterol (PROVENTIL HFA;VENTOLIN HFA) 108 (90 Base) MCG/ACT inhaler Inhale 1-2 puffs into the lungs every 6 (six) hours as needed for wheezing or shortness of breath. 07/16/16   Doles-Johnson, Teah, NP  albuterol (PROVENTIL) (2.5 MG/3ML) 0.083% nebulizer solution Take 3 mLs (2.5 mg total) by nebulization every 6 (six) hours as needed for wheezing or shortness of breath. 11/18/17   Loletha Grayer, MD  amLODipine (NORVASC) 2.5 MG tablet Take 2.5 mg by mouth  daily.    [provider]  Armodafinil 150 MG tablet Take 150 mg by mouth daily.    [provider]  aspirin EC 81 MG tablet Take 81 mg by mouth daily.    [provider]  atorvastatin (LIPITOR) 40 MG tablet TAKE ONE TABLET BY MOUTH EVERY DAY Patient taking differently: Take 40 mg by mouth at bedtime. 04/21/16   Tawni Millers, MD  BD PEN NEEDLE MICRO U/F 32G X 6 MM MISC SMARTSIG:Pre-Filled Pen Syringe Injection 3 Times Daily 09/22/19   [provider]  Blood Glucose  Monitoring Suppl (ACCU-CHEK GUIDE) w/Device KIT Use as instructed to check blood sugar 3 times a day. E11.59 09/05/19   Philemon Kingdom, MD  budesonide-formoterol (SYMBICORT) 80-4.5 MCG/ACT inhaler Inhale 2 puffs into the lungs 2 (two) times daily. 11/18/17   Loletha Grayer, MD  carvedilol (COREG) 25 MG tablet Take 1 tablet by mouth twice daily 04/25/20   Darylene Price A, FNP  chlorthalidone (HYGROTON) 25 MG tablet Take 25 mg by mouth daily.    [provider]  esomeprazole (NEXIUM) 40 MG capsule Take 40 mg by mouth daily. am    [provider]  fluticasone (FLONASE) 50 MCG/ACT nasal spray Place 1 spray into both nostrils daily.    [provider]  glucose blood (ACCU-CHEK GUIDE) test strip Use as instructed to check blood sugar 3 times a day. E11.59 09/05/19   Philemon Kingdom, MD  hydrochlorothiazide (HYDRODIURIL) 50 MG tablet Take 50 mg by mouth daily.    [provider]  insulin glargine (LANTUS SOLOSTAR) 100 UNIT/ML Solostar Pen INJECT 30 UNITS SUBCUTANEOUSLY IN THE MORNING AND 30 UNITS AT NIGHT 03/12/20   Philemon Kingdom, MD  ipratropium (ATROVENT) 0.06 % nasal spray Place 2 sprays into both nostrils 4 (four) times daily.    [provider]  methimazole (TAPAZOLE) 5 MG tablet Take 1 tablet by mouth once daily 05/08/20   Philemon Kingdom, MD  NOVOLOG FLEXPEN 100 UNIT/ML FlexPen INJECT 18-22 UNITS IN THE AM AND 20-24 UNITS IN THE EVENING before meals 04/04/20   Philemon Kingdom, MD  potassium chloride (KLOR-CON) 10 MEQ tablet Take by mouth 2 (two) times daily. 12/13/19 12/12/20  [provider]  Semaglutide, 1 MG/DOSE, (OZEMPIC, 1 MG/DOSE,) 4 MG/3ML SOPN Inject 1 mg into the skin once a week. 10/03/19   Philemon Kingdom, MD  tiotropium (SPIRIVA) 18 MCG inhalation capsule Place 18 mcg into inhaler and inhale daily.    [provider]  Vitamin D, Ergocalciferol, (DRISDOL) 1.25 MG (50000 UNIT) CAPS capsule Take 50,000 Units by mouth every  7 (seven) days.    [provider]    Allergies Patient has no known allergies.  Family History  Problem Relation Age of Onset   Diabetes Mother    Hypertension Mother    Diabetes Father    Hypertension Father    CAD Father    Diabetes Sister     Social History Social History   Tobacco Use   Smoking status: Former    Packs/day: 1.50    Years: 40.00    Pack years: 60.00    Types: Cigarettes    Quit date: 01/13/2016    Years since quitting: 4.5   Smokeless tobacco: Never  Vaping Use   Vaping Use: Never used  Substance Use Topics   Alcohol use: No   Drug use: No    Review of Systems Constitutional: Negative for fever. Cardiovascular: Negative for chest pain. Respiratory: Negative for shortness of  breath. Musculoskeletal: Positive for lateral/posterior neck pain. Positive for Left arm pain. Skin: Negative for open wounds or lesion.  Neurological: Negative for decrease in sensation  ____________________________________________   PHYSICAL EXAM:  VITAL SIGNS: ED Triage Vitals  Enc Vitals Group     BP 08/10/20 1328 (!) 158/92     Pulse Rate 08/10/20 1328 92     Resp 08/10/20 1328 18     Temp 08/10/20 1328 99.3 F (37.4 C)     Temp Source 08/10/20 1328 Oral     SpO2 08/10/20 1328 94 %     Weight 08/10/20 1327 212 lb (96.2 kg)     Height 08/10/20 1327 '5\' 4"'  (1.626 m)     Head Circumference --      Peak Flow --      Pain Score 08/10/20 1326 10     Pain Loc --      Pain Edu? --      Excl. in [place]? --     Constitutional: Alert and oriented. Well appearing and in no acute distress. Eyes: Conjunctivae are clear without discharge or drainage Head: Atraumatic Neck: Supple.  Respiratory: No cough. Respirations are even and unlabored. Musculoskeletal: FROM of neck and left shoulder. FROM of left elbow and wrist. Tenderness to palpation over left side posterior and lateral neck and superior trapezius.  Neurologic: Awake, alert, and oriented x 4.  Skin: No  open wounds or lesions. No rash over area of concern.  Psychiatric: Affect and behavior are appropriate.  ____________________________________________   LABS (all labs ordered are listed, but only abnormal results are displayed)  Labs Reviewed - No data to display ____________________________________________  RADIOLOGY  Not indicated.  I, Sherrie George, personally viewed and evaluated these images (plain radiographs) as part of my medical decision making, as well as reviewing the written report by the radiologist.  No results found. ____________________________________________   PROCEDURES  Procedures  ____________________________________________   INITIAL IMPRESSION / ASSESSMENT AND PLAN / ED COURSE  Emily [name] is a 60 y.o. who presents to the emergency department for evaluation of neck and arm pain. See HPI.   No indication for imaging as she denies injury. Injection of Toradol and tablet of flexeril given while here. She has no associated chest pain, shortness of breath and symptoms have not changed since onset 3 days ago. Pain starts in neck and goes into the left arm to the mid forearm.  Patient requesting discharge papers, but also complaining of arm pain. She questioned need for x-ray and advised because there has been no injury that would not change treatment plan and not necessary at this time.Marland Kitchen   She was prescribed zanaflex and naprosyn and is to follow up with primary care if not improving over the week. She is to return to the ER for symptoms that change or worsen if unable to schedule an appointment.  Medications  ketorolac (TORADOL) injection 30 mg (30 mg Intramuscular Given 08/10/20 1732)  cyclobenzaprine (FLEXERIL) tablet 5 mg (5 mg Oral Given 08/10/20 1733)    Pertinent labs & imaging results that were available during my care of the patient were reviewed by me and considered in my medical decision making (see chart for details).    _________________________________________   FINAL CLINICAL IMPRESSION(S) / ED DIAGNOSES  Final diagnoses:  Musculoskeletal pain of upper extremity, left    ED Discharge Orders          Ordered    tiZANidine (ZANAFLEX) 4 MG tablet  3 times  daily        08/10/20 1806    naproxen (NAPROSYN) 500 MG tablet  2 times daily with meals        08/10/20 1806             If controlled substance prescribed during this visit, 12 month history viewed on the [place] prior to issuing an initial prescription for Schedule II or III opiod.    Victorino Dike, FNP 08/10/20 1933    Delman Kitten, MD 08/11/20 2037

## 2020-08-13 ENCOUNTER — Other Ambulatory Visit: Payer: Self-pay | Admitting: Internal Medicine

## 2020-08-13 DIAGNOSIS — E1159 Type 2 diabetes mellitus with other circulatory complications: Secondary | ICD-10-CM

## 2020-08-26 ENCOUNTER — Other Ambulatory Visit: Payer: Self-pay | Admitting: Internal Medicine

## 2020-09-08 ENCOUNTER — Other Ambulatory Visit: Payer: Self-pay | Admitting: Internal Medicine

## 2020-09-08 DIAGNOSIS — E1159 Type 2 diabetes mellitus with other circulatory complications: Secondary | ICD-10-CM

## 2020-09-19 ENCOUNTER — Other Ambulatory Visit: Payer: Self-pay

## 2020-09-19 ENCOUNTER — Encounter: Payer: Self-pay | Admitting: Internal Medicine

## 2020-09-19 ENCOUNTER — Ambulatory Visit (INDEPENDENT_AMBULATORY_CARE_PROVIDER_SITE_OTHER): Payer: Medicaid Other | Admitting: Internal Medicine

## 2020-09-19 VITALS — BP 128/82 | HR 102 | Ht 64.0 in | Wt 217.0 lb

## 2020-09-19 DIAGNOSIS — Z794 Long term (current) use of insulin: Secondary | ICD-10-CM

## 2020-09-19 DIAGNOSIS — E0590 Thyrotoxicosis, unspecified without thyrotoxic crisis or storm: Secondary | ICD-10-CM

## 2020-09-19 DIAGNOSIS — E1165 Type 2 diabetes mellitus with hyperglycemia: Secondary | ICD-10-CM

## 2020-09-19 DIAGNOSIS — E1159 Type 2 diabetes mellitus with other circulatory complications: Secondary | ICD-10-CM | POA: Diagnosis not present

## 2020-09-19 DIAGNOSIS — E041 Nontoxic single thyroid nodule: Secondary | ICD-10-CM | POA: Diagnosis not present

## 2020-09-19 DIAGNOSIS — E108 Type 1 diabetes mellitus with unspecified complications: Secondary | ICD-10-CM

## 2020-09-19 DIAGNOSIS — E785 Hyperlipidemia, unspecified: Secondary | ICD-10-CM | POA: Diagnosis not present

## 2020-09-19 LAB — POCT GLYCOSYLATED HEMOGLOBIN (HGB A1C): Hemoglobin A1C: 6.8 % — AB (ref 4.0–5.6)

## 2020-09-19 LAB — T4, FREE: Free T4: 0.59 ng/dL — ABNORMAL LOW (ref 0.60–1.60)

## 2020-09-19 LAB — LIPID PANEL
Cholesterol: 145 mg/dL (ref 0–200)
HDL: 43.6 mg/dL (ref >39.00)
NonHDL: 101.67
Total CHOL/HDL Ratio: 3
Triglycerides: 212 mg/dL — ABNORMAL HIGH (ref 0.0–149.0)
VLDL: 42.4 mg/dL — ABNORMAL HIGH (ref 0.0–40.0)

## 2020-09-19 LAB — TSH: TSH: 1.44 u[IU]/mL (ref 0.35–5.50)

## 2020-09-19 LAB — LDL CHOLESTEROL, DIRECT: Direct LDL: 74 mg/dL

## 2020-09-19 MED ORDER — NOVOLOG FLEXPEN 100 UNIT/ML ~~LOC~~ SOPN: PEN_INJECTOR | SUBCUTANEOUS | 3 refills | Status: DC

## 2020-09-19 MED ORDER — LANTUS SOLOSTAR 100 UNIT/ML ~~LOC~~ SOPN: PEN_INJECTOR | SUBCUTANEOUS | 3 refills | Status: DC

## 2020-09-19 MED ORDER — OZEMPIC (1 MG/DOSE) 4 MG/3ML ~~LOC~~ SOPN: 1.0000 mg | PEN_INJECTOR | SUBCUTANEOUS | 3 refills | Status: DC

## 2020-09-19 NOTE — Patient Instructions (Addendum)
STOP SODAS or ANY SWEET DRINKS!!!  Please continue: - Lantus 30 units in a.m. and 30 units at bedtime - NovoLog 18-20 units before breakfast and 22-24 units before dinner  - Ozempic 1 mg weekly  Continue: - Methimazole 2.5 mg daily.  Please stop at the lab.  Please return in 3-4 months with your sugar log.

## 2020-09-19 NOTE — Progress Notes (Signed)
Patient ID: Emily [name], female   DOB: [birth year], 60 y.o.   MRN: [id number]  This visit occurred during the SARS-CoV-2 public health emergency.  Safety protocols were in place, including screening questions prior to the visit, additional usage of staff PPE, and extensive cleaning of exam room while observing appropriate contact time as indicated for disinfecting solutions.   HPI  Emily [name] is a 60 y.o.-year-old female, returning for f/u for thyrotoxicosis/goiter and DM2, uncontrolled, insulin-dependent, with complications (CKD stage 3, cerebro-vascular ds. - s/p stroke). Last visit 6 months ago.  Interim history: No increased urination, blurry vision, nausea, chest pain. Also, no neck compression symptoms. Still drinking regular sodas  - 2x a day! She has congestion and cough.  TMNG/Graves ds.: Reviewed and addended history: Pt was dx'ed with hyperthyroidism in ~2014 >> started MMI, lately 10 mg 2x daily. She was lost for f/u at [facility] as she could not travel over there.  Thyroid uptake and scan (06/22/2013) showed: FINDINGS: The 24 hour uptake was 24.7%. The normal reference range at 24 hours is 15 to 35%.   The thyroid scan demonstrates symmetric, mildly heterogenous distribution of radiopharmaceutical throughout the thyroid, which is increased in size.  IMPRESSION: Mildly heterogenous iodine uptake, cannot exclude toxic multinodular goiter versus possible mild Graves disease. Consider ultrasound for further correlation.  Patient was on methimazole before, however, this was stopped after  TSH returned very elevated, in the 90s (02/2015) >> TSH decreased to 8, then normalized >> we continued her off MMI.   However,she was started on MMI 10 mg 2x a day (re-Rx by Dr. Rosanna Randy: 04/17/2016, [facility]) - I do not think he was aware that we had stopped MMI.  We stopped methimazole again in 05/2016.  However, we had to restart again methimazole in 05/2017.  She was on 5 mg  daily, but this was stopped during the hospitalization for COPD and CHF exacerbation 02/2018.  In 12/2018 we had to restart methimazole 5 mg daily.  In 09/2019, we called her to decrease the dose to 2.5 mg daily, but she did not get the msg >> still on 5 mg daily.  In 12/2019, we decreased the methimazole dose to 2.5 mg daily.  Reviewed her TFTs: Lab Results  Component Value Date   TSH 2.12 03/12/2020   TSH 0.49 02/02/2020   TSH 3.80 12/12/2019   TSH 4.37 10/03/2019   TSH 2.99 06/13/2019   TSH 2.09 03/06/2019   TSH 0.15 (L) 12/26/2018   TSH 0.37 07/29/2018   TSH 0.45 04/05/2018   TSH 0.757 11/14/2017   FREET4 0.61 03/12/2020   FREET4 0.80 02/02/2020   FREET4 0.70 12/12/2019   FREET4 0.57 (L) 10/03/2019   FREET4 0.66 06/13/2019   FREET4 0.75 03/06/2019   FREET4 0.90 12/26/2018   FREET4 0.81 07/29/2018   FREET4 0.79 04/05/2018   FREET4 0.71 09/27/2017   Previously:   Thyroid U/S (05/12/2016): Large isthmic nodule Thyroid tissue is diffusely heterogeneous and enlarged.  The isthmus has a large isoechoic nodular structure. Unclear if this represents a discrete nodule versus focal heterogeneous thyroid tissue. This area measures up to 3.3 cm and recommend biopsy of this area.  Numerous small nodules and cysts throughout the left and right thyroid lobes. These predominantly cystic nodules do not meet criteria for biopsy or dedicated follow-up.  FNA of the isthmic nodule (06/25/2016): Benign  Neck U/S (05/07/2020): Parenchymal Echotexture: Moderately heterogenous Isthmus: 0.9 cm thickness, previously 1.2 Right lobe: 9.9 x 3.9 x 3.4 cm,  previously 8.4 x 2.8 x 4.5 Left lobe: 8.6 x 3 x 3.3 cm, previously 8.2 x 3 x 3.7 _________________________________________________________  Nodule # 1: 3.6 x 3.4 x 1.7 cm isthmic nodule, previously 3.3 x 2.7 x 1.7; this was previously biopsied.  Nodule # 2: Prior biopsy: No Location: Right; superior Maximum size: 1.2 cm; Other 2  dimensions: 0.8 x 0.7 cm, previously, 1 x 0.9 x 0.7 cm Composition: solid/almost completely solid (2) Echogenicity: hypoechoic (2) *Given size (>/= 1 - 1.4 cm) and appearance, a follow-up ultrasound in 1 year should be considered based on TI-RADS criteria. _________________________________________________________  Nodule # 3: Prior biopsy: No Location: Right; superior posterior Maximum size: 1.6 cm; Other 2 dimensions: 1.4 x 1 cm, previously, 1.6 x 1.3 x 1.3 cm Composition: mixed cystic and solid (1) Echogenicity: hypoechoic (2) *Given size (>/= 1.5 - 2.4 cm) and appearance, a follow-up ultrasound in 1 year should be considered based on TI-RADS criteria. _________________________________________________________  Nodule # 4: 1.3 cm benign colloid cyst, mid right  Nodule # 5: 1.1 cm benign colloid cyst, inferior medial right  Nodule # 6: 1.1 cm benign colloid cyst, inferior  right  Nodule # 7: Prior biopsy: No Location: Left; mid Maximum size: 2.2 cm; Other 2 dimensions: 1.4 x 0.8 cm, previously, 0.9 x 0.8 x 0.7 cm Composition: mixed cystic and solid (1) Echogenicity: hypoechoic (2) Significant change in size (>/= 20% in two dimensions and minimal increase of 2 mm): Yes *Given size (>/= 1.5 - 2.4 cm) and appearance, a follow-up ultrasound in 1 year should be considered based on TI-RADS criteria. _________________________________________________________  Nodule # 8: Prior biopsy: No Location: Left; inferior Maximum size: 1.3 cm; Other 2 dimensions: 1.1 x 0.5 cm, previously, 1.3 x 1 x 0.7 cm Composition: spongiform (0) Echogenicity: hypoechoic (2) This nodule does NOT meet TI-RADS criteria for biopsy or dedicated follow-up. ________________________________________________________  Nodule # 9: 1.3 cm spongiform nodule, inferior left, previously 2 cm; This nodule does NOT meet TI-RADS criteria for biopsy or dedicated follow-up.  Nodule # 10: 1.1 cm complex cyst without  calcifications, inferior left; This nodule does NOT meet TI-RADS criteria for biopsy or dedicated follow-up.  IMPRESSION: 1. Thyromegaly with bilateral nodules. None currently meets criteria for biopsy. 2. Recommend annual/biennial ultrasound follow-up of nodules as above, until stability x5 years confirmed.  Pt denies: - feeling nodules in neck - hoarseness - dysphagia - choking - SOB with lying down  Pt does have a FH of thyroid ds.: aunt. No FH of thyroid cancer. No h/o radiation tx to head or neck.  No herbal supplements. No Biotin use. No recent steroids use.   DM2: She has a history of noncompliance with medications. She had several complications: History of stroke, also CKD, DR.  Reviewed HbA1c levels: Lab Results  Component Value Date   HGBA1C 8.2 (A) 03/12/2020   HGBA1C 7.1 (A) 12/12/2019   HGBA1C 7.4 (A) 10/03/2019  10/03/2019: HbA1c 7.4% but HbA1c calculated from fructosamine is 5.4%.  She was on: - Lantus 45 units 2x a day >> was off insulin - "the pharmacy did not receive it" - Januvia 50 mg in am >> stopped (?) - Glipizide 10 mg 2x a day >> started in the hospital We added Jardiance at last visit >> not taking  - "the pharmacy did not receive it" She was on Metformin >> diarrhea.  Now on: - Ozempic 0.5 mg weekly restarted 07/2019 >> 1 mg weekly (however, reviewing her medication list, it appears that  she lately got the 0.5 mg dose refilled...) - Lantus 50 >> 30 units twice a day - NovoLog 16 units before breakfast and  20 units before dinner >> 18-20 units before breakfast and 22-24 units before dinner  We had to start Jardiance 10 mg 02/2018 due to decreased kidney function. Previously on Trulicity.  She is checking sugars twice a day: - am: 75-HI >> 82-181 >> 77, 141-273 >> 91-179, 201, HI - 2h after b'fast: n/c - lunch: n/c - 2h after lunch: n/c >> 178 >> 121 >> 128 >> n/c - dinner: 110-146 >> n/c >> 83-136 >> 165 >> n/c - 2h after dinner:  95-137,  226 >> 220-257 >> 111-140 - bedtime:  99-181, 207 >> 156-265 >> 108-149, 198, HI - nighttime: 79, 104-218, 223 >> 191-381 >> 73-254 Lowest: 82 >> 77 Highest: 381 >> 226 >> 381  + CKD; latest BUN/Cr: 07/10/2020: 32/3.02, GFR 17 Lab Results  Component Value Date   BUN 30 (H) 04/25/2019   Lab Results  Component Value Date   CREATININE 2.65 (H) 04/25/2019  GFR 24 (02/18/2018)  + HL; latest lipid panel: Lab Results  Component Value Date   CHOL 135 07/27/2019   HDL 46.30 07/27/2019   LDLCALC 67 07/27/2019   TRIG 109.0 07/27/2019   CHOLHDL 3 07/27/2019  On Lipitor 40.  Latest dilated eye exam was in 2022: + DR reportedly.  ROS: + See HPI  I reviewed pt's medications, allergies, PMH, social hx, family hx, and changes were documented in the history of present illness. Otherwise, unchanged from my initial visit note.  Past Medical History:  Diagnosis Date   Arthritis    hands, all over   Asthma    CHF (congestive heart failure) (HCC)    COPD (chronic obstructive pulmonary disease) (HCC)    on 2L home o2   Diabetes mellitus without complication (HCC)    type 2   Dyspnea    Hyperlipidemia    Hypertension    Sleep apnea    CPAP   Stroke [facility])    Was told at recent hospital visit she has stroke in past   Thyroid disease    Thyroid nodule 04/30/2016   Past Surgical History:  Procedure Laterality Date   arm surgery Left    COLONOSCOPY WITH PROPOFOL N/A 02/14/2019   Procedure: COLONOSCOPY WITH PROPOFOL;  Surgeon: [name], MD;  Location: [facility] ENDOSCOPY;  Service: Endoscopy;  Laterality: N/A;   Social History   Social History   Marital Status: Single    Spouse Name: N/A   Number of Children: 1   Occupational History   None   Social History Main Topics   Smoking status: Current Every Day Smoker -- 1.00 packs/day   Smokeless tobacco: Not on file   Alcohol Use: No   Drug Use: No   Current Outpatient Medications on File Prior to Visit  Medication Sig Dispense  Refill   Accu-Chek Softclix Lancets lancets Use as instructed to check blood sugar 3 times a day. E11.59 300 each 12   Acetylcysteine (NAC) 600 MG CAPS Take by mouth daily.     albuterol (PROVENTIL HFA;VENTOLIN HFA) 108 (90 Base) MCG/ACT inhaler Inhale 1-2 puffs into the lungs every 6 (six) hours as needed for wheezing or shortness of breath. 1 Inhaler 11   albuterol (PROVENTIL) (2.5 MG/3ML) 0.083% nebulizer solution Take 3 mLs (2.5 mg total) by nebulization every 6 (six) hours as needed for wheezing or shortness of breath. 100 vial 0  amLODipine (NORVASC) 2.5 MG tablet Take 2.5 mg by mouth daily.     Armodafinil 150 MG tablet Take 150 mg by mouth daily.     aspirin EC 81 MG tablet Take 81 mg by mouth daily.     atorvastatin (LIPITOR) 40 MG tablet TAKE ONE TABLET BY MOUTH EVERY DAY (Patient taking differently: Take 40 mg by mouth at bedtime.) 90 tablet PRN   BD PEN NEEDLE MICRO U/F 32G X 6 MM MISC SMARTSIG:Pre-Filled Pen Syringe Injection 3 Times Daily     Blood Glucose Monitoring Suppl (ACCU-CHEK GUIDE) w/Device KIT Use as instructed to check blood sugar 3 times a day. E11.59 1 kit 0   budesonide-formoterol (SYMBICORT) 80-4.5 MCG/ACT inhaler Inhale 2 puffs into the lungs 2 (two) times daily. 1 Inhaler 12   carvedilol (COREG) 25 MG tablet Take 1 tablet by mouth twice daily 180 tablet 3   chlorthalidone (HYGROTON) 25 MG tablet Take 25 mg by mouth daily.     esomeprazole (NEXIUM) 40 MG capsule Take 40 mg by mouth daily. am     fluticasone (FLONASE) 50 MCG/ACT nasal spray Place 1 spray into both nostrils daily.     glucose blood (ACCU-CHEK GUIDE) test strip Use as instructed to check blood sugar 3 times a day. E11.59 300 each 12   hydrochlorothiazide (HYDRODIURIL) 50 MG tablet Take 50 mg by mouth daily.     insulin glargine (LANTUS SOLOSTAR) 100 UNIT/ML Solostar Pen INJECT 30 UNITS SUBCUTANEOUSLY IN THE MORNING AND 30 UNITS AT NIGHT 72 mL 2   ipratropium (ATROVENT) 0.06 % nasal spray Place 2 sprays  into both nostrils 4 (four) times daily.     methimazole (TAPAZOLE) 5 MG tablet Take 1 tablet by mouth once daily 90 tablet 0   naproxen (NAPROSYN) 500 MG tablet Take 1 tablet (500 mg total) by mouth 2 (two) times daily with a meal. 30 tablet 0   NOVOLOG FLEXPEN 100 UNIT/ML FlexPen INJECT 18 TO 22 UNITS SUBCUTANEOUSLY IN THE MORNING AND 20 TO 24 UNITS IN THE EVENING BEFORE MEAL 15 mL 0   OZEMPIC, 0.25 OR 0.5 MG/DOSE, 2 MG/1.5ML SOPN INJECT 0.5 MG SUBCUTANEOUSLY ONCE A WEEK 1.5 mL 0   potassium chloride (KLOR-CON) 10 MEQ tablet Take by mouth 2 (two) times daily.     Semaglutide, 1 MG/DOSE, (OZEMPIC, 1 MG/DOSE,) 4 MG/3ML SOPN Inject 1 mg into the skin once a week. 9 mL 3   tiotropium (SPIRIVA) 18 MCG inhalation capsule Place 18 mcg into inhaler and inhale daily.     tiZANidine (ZANAFLEX) 4 MG tablet Take 1 tablet (4 mg total) by mouth 3 (three) times daily. 30 tablet 0   Vitamin D, Ergocalciferol, (DRISDOL) 1.25 MG (50000 UNIT) CAPS capsule Take 50,000 Units by mouth every 7 (seven) days.     No current facility-administered medications on file prior to visit.   No Known Allergies Family History  Problem Relation Age of Onset   Diabetes Mother    Hypertension Mother    Diabetes Father    Hypertension Father    CAD Father    Diabetes Sister    PE: BP 128/82 (BP Location: Right Arm, Patient Position: Sitting, Cuff Size: Normal)   Pulse (!) 102   Ht _0  (1.626 m)   Wt 217 lb (98.4 kg)   SpO2 (!) 89%   BMI 37.25 kg/m  There is no height or weight on file to calculate BMI.  Wt Readings from Last 3 Encounters:  09/19/20 217 lb (98.4  kg)  08/10/20 212 lb (96.2 kg)  07/23/20 214 lb 8 oz (97.3 kg)   Constitutional: overweight, in NAD Eyes: PERRLA, EOMI, no exophthalmos ENT: moist mucous membranes, +L>R thyromegaly, no cervical lymphadenopathy Cardiovascular: Tachycardia, RR, No MRG, + mild L>R LE edema Respiratory: CTA B Gastrointestinal: abdomen soft, NT, ND, BS+ Musculoskeletal:  no deformities, strength intact in all 4 Skin: moist, warm, no rashes Neurological: no tremor with outstretched hands, DTR normal in all 4  ASSESSMENT: 1. Thyrotoxicosis  2. Large goiter  Thyroid U/S (05/12/2016):  Parenchymal Echotexture: Moderately heterogenous Isthmus: 1.2 cm in the AP dimension Right lobe: 8.4 x 2.8 x 4.5 cm Left lobe: 8.2 x 3.0 x 3.7 cm ______________________________________________  Nodule # 1: Location: Isthmus; Inferior Maximum size: 3.3 cm; Other 2 dimensions: 1.7 x 2.7 cm Composition: solid/almost completely solid (2) Echogenicity: isoechoic (1)  **Given size (>/= 2.5 cm) and appearance, fine needle aspiration of this mildly suspicious nodule should be considered based on TI-RADS criteria. _______________________________________________________  Mildly complex cystic structure in the superior right thyroid lobe measuring up to 1.0 cm. Minimally complex cyst in the superior right thyroid lobe measuring up to 1.6 cm. Adjacent complex cysts or spongiform nodules in the mid right thyroid lobe, largest measuring 1.1 cm. Spongiform nodule in the mid left thyroid lobe measuring up to 1.3 cm.  Nodule # 2: Location: Left; Mid Maximum size: 2.0 cm; Other 2 dimensions: 0.8 x 0.8 cm Composition: mixed cystic and solid (1) Echogenicity: cannot determine (1)  This nodule does NOT meet TI-RADS criteria for biopsy or dedicated follow-up. _______________________________________________________  IMPRESSION: Thyroid tissue is diffusely heterogeneous and enlarged.  The isthmus has a large isoechoic nodular structure. Unclear if this represents a discrete nodule versus focal heterogeneous thyroid tissue. This area measures up to 3.3 cm and recommend biopsy of this area.  Numerous small nodules and cysts throughout the left and right thyroid lobes. These predominantly cystic nodules do not meet criteria for biopsy or dedicated follow-up.  The above is in  keeping with the ACR TI-RADS recommendations - J Am Coll Radiol 2017;14:587-595.   Electronically Signed   By: Markus Daft M.D.   On: 05/12/2016 12:03   FNA (06/25/2016): Adequacy Reason Satisfactory For Evaluation. Diagnosis THYROID, FINE NEEDLE ASPIRATION ISTHMUS INFERIOR (SPECIMEN 1 OF 1, COLLECTED ON 06/25/2016): CONSISTENT WITH BENIGN FOLLICULAR NODULE (BETHESDA CATEGORY II). Casimer Lanius MD Pathologist, Electronic Signature (Case signed 06/26/2016) Specimen Clinical Information Nodule 1 Isthmus Inferior 3.3 cm; Other 2 dimensions: 1.7 x 2.7 cm, Solid / almost completely solid, Isoechoic, ACR TI-RADS total points: 3, Midly suspicious nodule Source Thyroid, Fine Needle Aspiration, Isthmus Inferior, (Specimen 1 of 1, colected on 06/25/16 )  - also see HPI  3. DM2, insulin dependent, uncontrolled  4. HL  PLAN:  1. And 2.  -Patient with history of thyrotoxicosis, either due to mild Graves' disease or mild toxic multinodular goiter per review of her thyroid uptake and scan report -She tolerates methimazole well -At last visit we decreased the methimazole dose to 2.5 mg daily -Latest TFTs were normal on the above dose in 03/2020 -We will continue the  2.5 mg dose of methimazole for now -We will recheck her TFTs today and adjust methimazole dose accordingly  2. Large goiter -She continues to have minimal neck compression symptoms, not bothersome -The dominant nodule was biopsied in 2018 with benign results.  This was stable on the latest ultrasound from 4 months ago. -We will need to repeat the ultrasound next year.  No intervention needed for now.  3. DM2 -Patient with longstanding, previously fairly well-controlled type 2 diabetes, but worse at last check in 03/2020, when an HbA1c returned 8.2% and her blood sugars were frequently between 200s and 300s.  At that time, she was drinking 2-3 regular sodas a day and I advised her that it is mandatory that she stopped these.  We  also increased her NovoLog dose at that time.  We did not change the rest of her regimen. -At this visit, she confirmed that she is taking the insulin doses correctly, as advised.  Sugars improved significantly since last visit.  She refilled her lower dose Ozempic by mistake recently and I advised her to finish this and then switch to the higher dose, which I sent again to her pharmacy.  I also refilled her insulins.   -She is still drinking 2 sodas a day.  She did not even attempt to cut down since last visit.  We discussed at this visit about the many negative effects of soda on her body.  Her kidneys are already failing with the latest GFR being 17.  She is also on large doses of insulin, she gained weight since last visit and also has a blood pressure above goal.  I strongly advised her to reconsider stopping sodas.  We discussed that if she starts doing so, she most likely will need to reduce the dose of NovoLog.  She can let me know if the sugars start dropping so we can adjust the doses together. - I advised her to: Patient Instructions  STOP SODAS or ANY SWEET DRINKS!!!  Please continue: - Lantus 30 units in a.m. and 30 units at bedtime - NovoLog 18-20 units before breakfast and 22-24 units before dinner  - Ozempic 1 mg weekly  Continue: - Methimazole 2.5 mg daily.  Please stop at the lab.  Please return in 3-4 months with your sugar log.   - we checked her HbA1c: 6.8% (significant improvement) - advised to check sugars at different times of the day - 3x a day, rotating check times - again advised for yearly eye exams >> she is not UTD-at last visit, I printed the telephone number for [place] where she went before, in 2020 and advised her to call her and schedule a new appointment - return to clinic in 3-4 months  4. HL - Reviewed latest lipid panel from 07/2019: All fractions at goal: Lab Results  Component Value Date   CHOL 135 07/27/2019   HDL 46.30 07/27/2019   LDLCALC  67 07/27/2019   TRIG 109.0 07/27/2019   CHOLHDL 3 07/27/2019  - Continues atorvastatin 40 mg daily without side effects. -We will repeat her lipid panel now  CC: PCP: Gennette Pac, FNP - [phone]  Needs refills MMI 2.5 mg daily if we need to continue.  Component     Latest Ref Rng & Units 09/19/2020  Cholesterol     0 - 200 mg/dL 145  Triglycerides     0.0 - 149.0 mg/dL 212.0 (H)  HDL Cholesterol     >39.00 mg/dL 43.60  VLDL     0.0 - 40.0 mg/dL 42.4 (H)  Total CHOL/HDL Ratio      3  NonHDL      101.67  T4,Free(Direct)     0.60 - 1.60 ng/dL 0.59 (L)  TSH     0.35 - 5.50 uIU/mL 1.44  Direct LDL     mg/dL 74.0  TSH is normal, but  free T4 is slightly low.  We will try to stop methimazole and repeat her thyroid tests in 1 month. LDL is slightly above our target of less than 70, HDL at goal, triglycerides slightly high.  Philemon Kingdom, MD PhD [facility] Endocrinology

## 2020-09-20 NOTE — Addendum Note (Signed)
Addended by: Philemon Kingdom on: 09/20/2020 08:58 AM   Modules accepted: Orders

## 2020-09-24 ENCOUNTER — Other Ambulatory Visit: Payer: Self-pay | Admitting: Physician Assistant

## 2020-09-24 DIAGNOSIS — Z1231 Encounter for screening mammogram for malignant neoplasm of breast: Secondary | ICD-10-CM

## 2020-10-03 ENCOUNTER — Other Ambulatory Visit: Payer: Self-pay | Admitting: Internal Medicine

## 2020-10-10 ENCOUNTER — Other Ambulatory Visit: Payer: Self-pay

## 2020-10-10 ENCOUNTER — Ambulatory Visit: Payer: Medicaid Other | Admitting: Podiatry

## 2020-10-10 ENCOUNTER — Encounter: Payer: Self-pay | Admitting: Podiatry

## 2020-10-10 DIAGNOSIS — N189 Chronic kidney disease, unspecified: Secondary | ICD-10-CM

## 2020-10-10 DIAGNOSIS — M79674 Pain in right toe(s): Secondary | ICD-10-CM | POA: Diagnosis not present

## 2020-10-10 DIAGNOSIS — I872 Venous insufficiency (chronic) (peripheral): Secondary | ICD-10-CM | POA: Diagnosis not present

## 2020-10-10 DIAGNOSIS — E1159 Type 2 diabetes mellitus with other circulatory complications: Secondary | ICD-10-CM

## 2020-10-10 DIAGNOSIS — B351 Tinea unguium: Secondary | ICD-10-CM | POA: Diagnosis not present

## 2020-10-10 DIAGNOSIS — M79675 Pain in left toe(s): Secondary | ICD-10-CM | POA: Diagnosis not present

## 2020-10-10 NOTE — Progress Notes (Signed)
Complaint:  Visit Type: Patient returns to my office for continued preventative foot care services. Complaint: Patient states" my nails have grown long and thick and become painful to walk and wear shoes" Patient has been diagnosed with DM with vascular disease and chronic venous insufficiency and chronic kidney disease.. The patient presents for preventative foot care services.  Podiatric Exam: Vascular: dorsalis pedis and posterior tibial pulses are weakly  palpable bilateral. Capillary return is immediate. Temperature gradient is WNL. Skin turgor WNL  Sensorium: Normal Semmes Weinstein monofilament test. Normal tactile sensation bilaterally. Nail Exam: Pt has thick disfigured discolored nails with subungual debris noted bilateral entire nail hallux through fifth toenails Ulcer Exam: There is no evidence of ulcer or pre-ulcerative changes or infection. Orthopedic Exam: Muscle tone and strength are WNL. No limitations in general ROM. No crepitus or effusions noted. Foot type and digits show no abnormalities. Bony prominences are unremarkable. Skin: No Porokeratosis. No infection or ulcers  Diagnosis:  Onychomycosis, , Pain in right toe, pain in left toes  Treatment & Plan Procedures and Treatment: Consent by patient was obtained for treatment procedures.   Debridement of mycotic and hypertrophic toenails, 1 through 5 bilateral and clearing of subungual debris. No ulceration, no infection noted.  Return Visit-Office Procedure: Patient instructed to return to the office for a follow up visit 3 months for continued evaluation and treatment.    Gardiner Barefoot DPM

## 2020-10-29 ENCOUNTER — Other Ambulatory Visit (INDEPENDENT_AMBULATORY_CARE_PROVIDER_SITE_OTHER): Payer: Medicaid Other

## 2020-10-29 ENCOUNTER — Other Ambulatory Visit: Payer: Self-pay

## 2020-10-29 DIAGNOSIS — E0590 Thyrotoxicosis, unspecified without thyrotoxic crisis or storm: Secondary | ICD-10-CM | POA: Diagnosis not present

## 2020-10-29 LAB — TSH: TSH: 0.21 u[IU]/mL — ABNORMAL LOW (ref 0.35–5.50)

## 2020-10-29 LAB — T3, FREE: T3, Free: 2.9 pg/mL (ref 2.3–4.2)

## 2020-10-29 LAB — T4, FREE: Free T4: 0.82 ng/dL (ref 0.60–1.60)

## 2020-10-30 ENCOUNTER — Telehealth: Payer: Self-pay

## 2020-10-30 NOTE — Telephone Encounter (Addendum)
Called and left message for pt to call back to discuss results. 5 week lab appt needed ----- Message from Philemon Kingdom, MD sent at 10/29/2020  4:52 PM EDT ----- Can you please call pt.:  TSH is slightly low, while free T4 and free T3 are normal.  For now, we can try to keep her off methimazole, but I would like to repeat her tests in 4 to 5 weeks.  At that time, we will see if we can continue off methimazole or need to restart.  Can you please order a free T4, free T3, TSH?

## 2020-12-03 ENCOUNTER — Other Ambulatory Visit: Payer: Self-pay | Admitting: Internal Medicine

## 2020-12-03 DIAGNOSIS — E0590 Thyrotoxicosis, unspecified without thyrotoxic crisis or storm: Secondary | ICD-10-CM

## 2020-12-05 ENCOUNTER — Other Ambulatory Visit: Payer: Self-pay | Admitting: Internal Medicine

## 2020-12-05 ENCOUNTER — Other Ambulatory Visit (INDEPENDENT_AMBULATORY_CARE_PROVIDER_SITE_OTHER): Payer: Medicaid Other

## 2020-12-05 ENCOUNTER — Other Ambulatory Visit: Payer: Self-pay

## 2020-12-05 DIAGNOSIS — E0590 Thyrotoxicosis, unspecified without thyrotoxic crisis or storm: Secondary | ICD-10-CM

## 2020-12-05 LAB — TSH: TSH: 0.05 u[IU]/mL — ABNORMAL LOW (ref 0.35–5.50)

## 2020-12-05 LAB — T4, FREE: Free T4: 0.83 ng/dL (ref 0.60–1.60)

## 2020-12-05 LAB — T3, FREE: T3, Free: 3.8 pg/mL (ref 2.3–4.2)

## 2020-12-05 MED ORDER — METHIMAZOLE 5 MG PO TABS: 5.0000 mg | ORAL_TABLET | Freq: Every day | ORAL | 3 refills | Status: DC

## 2020-12-16 ENCOUNTER — Encounter (INDEPENDENT_AMBULATORY_CARE_PROVIDER_SITE_OTHER): Payer: Self-pay | Admitting: Vascular Surgery

## 2020-12-16 ENCOUNTER — Other Ambulatory Visit: Payer: Self-pay

## 2020-12-16 ENCOUNTER — Ambulatory Visit (INDEPENDENT_AMBULATORY_CARE_PROVIDER_SITE_OTHER): Payer: Medicaid Other | Admitting: Vascular Surgery

## 2020-12-16 VITALS — BP 183/93 | HR 92 | Resp 18 | Wt 219.0 lb

## 2020-12-16 DIAGNOSIS — E1159 Type 2 diabetes mellitus with other circulatory complications: Secondary | ICD-10-CM

## 2020-12-16 DIAGNOSIS — J449 Chronic obstructive pulmonary disease, unspecified: Secondary | ICD-10-CM

## 2020-12-16 DIAGNOSIS — E782 Mixed hyperlipidemia: Secondary | ICD-10-CM

## 2020-12-16 DIAGNOSIS — I890 Lymphedema, not elsewhere classified: Secondary | ICD-10-CM | POA: Diagnosis not present

## 2020-12-16 DIAGNOSIS — I10 Essential (primary) hypertension: Secondary | ICD-10-CM

## 2020-12-16 NOTE — Progress Notes (Signed)
MRN : [id number]  Emily [name] is a 61 y.o. (01-11-1960) female who presents with chief complaint of check leg swelling.  History of Present Illness:   The patient returns to the office for followup evaluation regarding leg swelling.  The swelling has persisted but with the lymph pump is much, much better controlled. The pain associated with swelling is essentially eliminated. There have not been any interval development of a ulcerations or wounds.  The patient denies problems with the pump, noting it is working well and the leggings are in good condition.  Since the previous visit the patient has been wearing graduated compression stockings and using the lymph pump on a routine basis and  has noted significant improvement in the lymphedema.   Patient stated the lymph pump has been a very positive factor in her care.    Current Meds  Medication Sig   Accu-Chek Softclix Lancets lancets Use as instructed to check blood sugar 3 times a day. E11.59   Acetylcysteine (NAC) 600 MG CAPS Take by mouth daily.   Acetylcysteine 600 MG CAPS Take 1 capsule by mouth daily.   albuterol (PROVENTIL HFA;VENTOLIN HFA) 108 (90 Base) MCG/ACT inhaler Inhale 1-2 puffs into the lungs every 6 (six) hours as needed for wheezing or shortness of breath.   albuterol (PROVENTIL) (2.5 MG/3ML) 0.083% nebulizer solution Take 3 mLs (2.5 mg total) by nebulization every 6 (six) hours as needed for wheezing or shortness of breath.   amLODipine (NORVASC) 2.5 MG tablet Take 2.5 mg by mouth daily.   amLODipine (NORVASC) 2.5 MG tablet Take 1 tablet by mouth daily.   Armodafinil 150 MG tablet Take 150 mg by mouth daily.   aspirin EC 81 MG tablet Take 81 mg by mouth daily.   atorvastatin (LIPITOR) 40 MG tablet TAKE ONE TABLET BY MOUTH EVERY DAY (Patient taking differently: Take 40 mg by mouth at bedtime.)   BD PEN NEEDLE MICRO U/F 32G X 6 MM MISC SMARTSIG:Pre-Filled Pen Syringe Injection 3 Times Daily   Blood Glucose  Monitoring Suppl (ACCU-CHEK GUIDE) w/Device KIT Use as instructed to check blood sugar 3 times a day. E11.59   budesonide-formoterol (SYMBICORT) 80-4.5 MCG/ACT inhaler Inhale 2 puffs into the lungs 2 (two) times daily.   carvedilol (COREG) 25 MG tablet Take 1 tablet by mouth twice daily   chlorthalidone (HYGROTON) 25 MG tablet Take 25 mg by mouth daily.   cyclobenzaprine (FLEXERIL) 10 MG tablet Take 1 tablet by mouth 3 (three) times daily as needed.   esomeprazole (NEXIUM) 40 MG capsule Take 40 mg by mouth daily. am   fluticasone (FLONASE) 50 MCG/ACT nasal spray Place 1 spray into both nostrils daily.   glucose blood (ACCU-CHEK GUIDE) test strip USE TO CHECK BLOOD SUGAR THREE TIMES DAILY   hydrochlorothiazide (HYDRODIURIL) 50 MG tablet Take 50 mg by mouth daily.   insulin glargine (LANTUS SOLOSTAR) 100 UNIT/ML Solostar Pen INJECT 30 UNITS SUBCUTANEOUSLY IN THE MORNING AND 30 UNITS AT NIGHT   ipratropium (ATROVENT) 0.06 % nasal spray Place 2 sprays into both nostrils 4 (four) times daily.   ipratropium (ATROVENT) 0.06 % nasal spray Place into the nose.   methimazole (TAPAZOLE) 5 MG tablet Take 1 tablet (5 mg total) by mouth daily.   naproxen (NAPROSYN) 500 MG tablet Take 1 tablet (500 mg total) by mouth 2 (two) times daily with a meal.   naproxen (NAPROSYN) 500 MG tablet Take 1 tablet by mouth 2 (two) times daily with a meal.   NOVOLOG  FLEXPEN 100 UNIT/ML FlexPen Inject 18-24 units 2x a day before meals   Semaglutide, 1 MG/DOSE, (OZEMPIC, 1 MG/DOSE,) 4 MG/3ML SOPN Inject 1 mg into the skin once a week.   tiotropium (SPIRIVA) 18 MCG inhalation capsule Place 18 mcg into inhaler and inhale daily.   tiZANidine (ZANAFLEX) 4 MG tablet Take 1 tablet (4 mg total) by mouth 3 (three) times daily.   tiZANidine (ZANAFLEX) 4 MG tablet Take by mouth.   Vitamin D, Ergocalciferol, (DRISDOL) 1.25 MG (50000 UNIT) CAPS capsule Take 50,000 Units by mouth every 7 (seven) days.    Past Medical History:  Diagnosis  Date   Arthritis    hands, all over   Asthma    CHF (congestive heart failure) (HCC)    COPD (chronic obstructive pulmonary disease) (HCC)    on 2L home o2   Diabetes mellitus without complication (HCC)    type 2   Dyspnea    Hyperlipidemia    Hypertension    Sleep apnea    CPAP   Stroke [facility])    Was told at recent hospital visit she has stroke in past   Thyroid disease    Thyroid nodule 04/30/2016    Past Surgical History:  Procedure Laterality Date   arm surgery Left    COLONOSCOPY WITH PROPOFOL N/A 02/14/2019   Procedure: COLONOSCOPY WITH PROPOFOL;  Surgeon: [name], MD;  Location: [facility] ENDOSCOPY;  Service: Endoscopy;  Laterality: N/A;    Social History Social History   Tobacco Use   Smoking status: Former    Packs/day: 1.50    Years: 40.00    Pack years: 60.00    Types: Cigarettes    Quit date: 01/13/2016    Years since quitting: 4.9   Smokeless tobacco: Never  Vaping Use   Vaping Use: Never used  Substance Use Topics   Alcohol use: No   Drug use: No    Family History Family History  Problem Relation Age of Onset   Diabetes Mother    Hypertension Mother    Diabetes Father    Hypertension Father    CAD Father    Diabetes Sister     No Known Allergies   REVIEW OF SYSTEMS (Negative unless checked)  Constitutional: _0 Weight loss  _1 Fever  _2 Chills Cardiac: _3 Chest pain   _4 Chest pressure   _5 Palpitations   _6 Shortness of breath when laying flat   _7 Shortness of breath with exertion. Vascular:  _8 Pain in legs with walking   _9 Pain in legs at rest  _10 History of DVT   _11 Phlebitis   _12 Swelling in legs   _13 Varicose veins   _14 Non-healing ulcers Pulmonary:   _15 Uses home oxygen   _16 Productive cough   _17 Hemoptysis   _18 Wheeze  _19 COPD   _20 Asthma Neurologic:  _21 Dizziness   _22 Seizures   _23 History of stroke   _24 History of TIA  _25 Aphasia   _26 Vissual changes   _27 Weakness or numbness in arm   _28 Weakness or numbness in leg Musculoskeletal:   _29 Joint swelling    _30 Joint pain   _31 Low back pain Hematologic:  _32 Easy bruising  _33 Easy bleeding   _34 Hypercoagulable state   _35 Anemic Gastrointestinal:  _36 Diarrhea   _37 Vomiting  _38 Gastroesophageal reflux/heartburn   _39 Difficulty swallowing. Genitourinary:  _40 Chronic kidney disease   _41 Difficult urination  _42 Frequent urination   _43 Blood in urine Skin:  _44 Rashes   _45 Ulcers  Psychological:  _46 History of anxiety   _47  History of major depression.  Physical Examination  Vitals:   12/16/20 0923  BP: (!) 183/93  Pulse:  92  Resp: 18  Weight: 219 lb (99.3 kg)   Body mass index is 37.59 kg/m. Gen: WD/WN, NAD Head: [place]/AT, No temporalis wasting.  Ear/Nose/Throat: Hearing grossly intact, nares w/o erythema or drainage, pinna without lesions Eyes: PER, EOMI, sclera nonicteric.  Neck: Supple, no gross masses.  No JVD.  Pulmonary:  Good air movement, no audible wheezing, no use of accessory muscles.  Cardiac: RRR, precordium not hyperdynamic. Vascular:  scattered varicosities present bilaterally.  Moderate venous stasis changes to the legs bilaterally.  3+ soft pitting edema left >> right  Vessel Right Left  Radial Palpable Palpable  Gastrointestinal: soft, non-distended. No guarding/no peritoneal signs.  Musculoskeletal: M/S 5/5 throughout.  No deformity.  Neurologic: CN 2-12 intact. Pain and light touch intact in extremities.  Symmetrical.  Speech is fluent. Motor exam as listed above. Psychiatric: Judgment intact, Mood & affect appropriate for pt's clinical situation. Dermatologic: Moderate venous rashes no ulcers noted.  No changes consistent with cellulitis. Lymph : No lichenification or skin changes of chronic lymphedema.  CBC Lab Results  Component Value Date   WBC 7.0 04/25/2019   HGB 11.8 (L) 04/25/2019   HCT 39.8 04/25/2019   MCV 92.1 04/25/2019   PLT 204 04/25/2019    BMET    Component Value Date/Time   NA 144 04/25/2019 1138   NA 142 05/14/2016 1852   NA 140 06/02/2013 1230   K 4.9  04/25/2019 1138   K 3.5 06/02/2013 1230   CL 110 04/25/2019 1138   CL 107 06/02/2013 1230   CO2 28 04/25/2019 1138   CO2 26 06/02/2013 1230   GLUCOSE 92 04/25/2019 1138   GLUCOSE 178 (H) 06/02/2013 1230   BUN 30 (H) 04/25/2019 1138   BUN 27 (H) 05/14/2016 1852   BUN 14 06/02/2013 1230   CREATININE 2.65 (H) 04/25/2019 1138   CREATININE 1.87 (H) 04/05/2018 1008   CALCIUM 8.5 (L) 04/25/2019 1138   CALCIUM 9.5 06/02/2013 1230   GFRNONAA 19 (L) 04/25/2019 1138   GFRNONAA 29 (L) 04/05/2018 1008   GFRAA 22 (L) 04/25/2019 1138   GFRAA 34 (L) 04/05/2018 1008   CrCl cannot be calculated (Patient's most recent lab result is older than the maximum 21 days allowed.).  COAG Lab Results  Component Value Date   INR 0.94 08/13/2016    Radiology No results found.   Assessment/Plan 1. Lymphedema  No surgery or intervention at this point in time.    I have reviewed my discussion with the patient regarding lymphedema and why it  causes symptoms.  Patient will continue wearing graduated compression stockings class 1 (20-30 mmHg) on a daily basis a prescription was given. The patient is reminded to put the stockings on first thing in the morning and removing them in the evening. The patient is instructed specifically not to sleep in the stockings.   In addition, behavioral modification throughout the day will be continued.  This will include frequent elevation (such as in a recliner), use of over the counter pain medications as needed and exercise such as walking.  I have reviewed systemic causes for chronic edema such as liver, kidney and cardiac etiologies and there does not appear to be any significant changes in these organ systems over the past year.  The patient is under the impression that these organ systems are all stable and unchanged.    The patient will continue aggressive use of the  lymph pump.  This will continue to improve the edema control and prevent  sequela such as ulcers and  infections.   The patient will follow-up with me on a PRN basis.    2. Mixed hyperlipidemia Continue statin as ordered and reviewed, no changes at this time   3. Essential hypertension Continue antihypertensive medications as already ordered, these medications have been reviewed and there are no changes at this time.   4. Type 2 diabetes mellitus with vascular disease ([place]) Continue hypoglycemic medications as already ordered, these medications have been reviewed and there are no changes at this time.  Hgb A1C to be monitored as already arranged by primary service   5. Chronic obstructive pulmonary disease, unspecified COPD type ([place]) Continue pulmonary medications and aerosols as already ordered, these medications have been reviewed and there are no changes at this time.      Hortencia Pilar, MD  12/16/2020 9:27 AM

## 2020-12-17 ENCOUNTER — Encounter (INDEPENDENT_AMBULATORY_CARE_PROVIDER_SITE_OTHER): Payer: Self-pay | Admitting: Vascular Surgery

## 2021-01-01 ENCOUNTER — Other Ambulatory Visit: Payer: Self-pay | Admitting: Internal Medicine

## 2021-01-09 ENCOUNTER — Other Ambulatory Visit: Payer: Medicaid Other

## 2021-01-09 ENCOUNTER — Other Ambulatory Visit (INDEPENDENT_AMBULATORY_CARE_PROVIDER_SITE_OTHER): Payer: Medicaid Other

## 2021-01-09 ENCOUNTER — Other Ambulatory Visit: Payer: Self-pay

## 2021-01-09 DIAGNOSIS — E0590 Thyrotoxicosis, unspecified without thyrotoxic crisis or storm: Secondary | ICD-10-CM | POA: Diagnosis not present

## 2021-01-09 LAB — T3, FREE: T3, Free: 3.6 pg/mL (ref 2.3–4.2)

## 2021-01-09 LAB — T4, FREE: Free T4: 0.63 ng/dL (ref 0.60–1.60)

## 2021-01-09 LAB — TSH: TSH: 0.56 u[IU]/mL (ref 0.35–5.50)

## 2021-01-16 ENCOUNTER — Other Ambulatory Visit: Payer: Self-pay

## 2021-01-16 ENCOUNTER — Ambulatory Visit: Payer: Medicaid Other | Admitting: Podiatry

## 2021-01-16 ENCOUNTER — Encounter: Payer: Self-pay | Admitting: Podiatry

## 2021-01-16 DIAGNOSIS — B351 Tinea unguium: Secondary | ICD-10-CM

## 2021-01-16 DIAGNOSIS — N189 Chronic kidney disease, unspecified: Secondary | ICD-10-CM

## 2021-01-16 DIAGNOSIS — M79675 Pain in left toe(s): Secondary | ICD-10-CM | POA: Diagnosis not present

## 2021-01-16 DIAGNOSIS — M79674 Pain in right toe(s): Secondary | ICD-10-CM

## 2021-01-16 DIAGNOSIS — I872 Venous insufficiency (chronic) (peripheral): Secondary | ICD-10-CM

## 2021-01-16 DIAGNOSIS — E1165 Type 2 diabetes mellitus with hyperglycemia: Secondary | ICD-10-CM

## 2021-01-16 DIAGNOSIS — E1159 Type 2 diabetes mellitus with other circulatory complications: Secondary | ICD-10-CM

## 2021-01-16 LAB — HM DIABETES FOOT EXAM

## 2021-01-16 NOTE — Progress Notes (Addendum)
Complaint:  Visit Type: Patient returns to my office for continued preventative foot care services. Complaint: Patient states" my nails have grown long and thick and become painful to walk and wear shoes" Patient has been diagnosed with DM with vascular disease and chronic venous insufficiency and chronic kidney disease.. The patient presents for preventative foot care services.  Podiatric Exam: Vascular: dorsalis pedis and posterior tibial pulses are weakly  palpable bilateral. Capillary return is immediate. Temperature gradient is WNL. Skin turgor WNL  Sensorium: Normal Semmes Weinstein monofilament test. Normal tactile sensation bilaterally. Nail Exam: Pt has thick disfigured discolored nails with subungual debris noted bilateral entire nail hallux through fifth toenails Ulcer Exam: There is no evidence of ulcer or pre-ulcerative changes or infection. Orthopedic Exam: Muscle tone and strength are WNL. No limitations in general ROM. No crepitus or effusions noted. Foot type and digits show no abnormalities. Bony prominences are unremarkable. Skin: No Porokeratosis. No infection or ulcers  Diagnosis:  Onychomycosis, , Pain in right toe, pain in left toes  Treatment & Plan Procedures and Treatment: Consent by patient was obtained for treatment procedures.   Debridement of mycotic and hypertrophic toenails, 1 through 5 bilateral and clearing of subungual debris. No ulceration, no infection noted.  Return Visit-Office Procedure: Patient instructed to return to the office for a follow up visit 3 months for continued evaluation and treatment. RTC 3 months    Mckinzie Saksa DPM 

## 2021-01-21 ENCOUNTER — Other Ambulatory Visit: Payer: Self-pay

## 2021-01-21 ENCOUNTER — Encounter: Payer: Self-pay | Admitting: Family

## 2021-01-21 ENCOUNTER — Ambulatory Visit: Payer: Medicaid Other | Attending: Family | Admitting: Family

## 2021-01-21 ENCOUNTER — Encounter: Payer: Self-pay | Admitting: Pharmacist

## 2021-01-21 VITALS — BP 163/93 | HR 100 | Resp 20 | Ht 64.0 in | Wt 217.1 lb

## 2021-01-21 DIAGNOSIS — Z833 Family history of diabetes mellitus: Secondary | ICD-10-CM | POA: Insufficient documentation

## 2021-01-21 DIAGNOSIS — I5032 Chronic diastolic (congestive) heart failure: Secondary | ICD-10-CM | POA: Diagnosis not present

## 2021-01-21 DIAGNOSIS — N189 Chronic kidney disease, unspecified: Secondary | ICD-10-CM | POA: Diagnosis not present

## 2021-01-21 DIAGNOSIS — Z87891 Personal history of nicotine dependence: Secondary | ICD-10-CM | POA: Diagnosis not present

## 2021-01-21 DIAGNOSIS — G4733 Obstructive sleep apnea (adult) (pediatric): Secondary | ICD-10-CM | POA: Diagnosis not present

## 2021-01-21 DIAGNOSIS — Z8249 Family history of ischemic heart disease and other diseases of the circulatory system: Secondary | ICD-10-CM | POA: Insufficient documentation

## 2021-01-21 DIAGNOSIS — E079 Disorder of thyroid, unspecified: Secondary | ICD-10-CM | POA: Insufficient documentation

## 2021-01-21 DIAGNOSIS — I130 Hypertensive heart and chronic kidney disease with heart failure and stage 1 through stage 4 chronic kidney disease, or unspecified chronic kidney disease: Secondary | ICD-10-CM | POA: Insufficient documentation

## 2021-01-21 DIAGNOSIS — Z9989 Dependence on other enabling machines and devices: Secondary | ICD-10-CM | POA: Insufficient documentation

## 2021-01-21 DIAGNOSIS — N184 Chronic kidney disease, stage 4 (severe): Secondary | ICD-10-CM

## 2021-01-21 DIAGNOSIS — J449 Chronic obstructive pulmonary disease, unspecified: Secondary | ICD-10-CM

## 2021-01-21 DIAGNOSIS — Z8673 Personal history of transient ischemic attack (TIA), and cerebral infarction without residual deficits: Secondary | ICD-10-CM | POA: Diagnosis not present

## 2021-01-21 DIAGNOSIS — E1122 Type 2 diabetes mellitus with diabetic chronic kidney disease: Secondary | ICD-10-CM

## 2021-01-21 DIAGNOSIS — I10 Essential (primary) hypertension: Secondary | ICD-10-CM | POA: Diagnosis not present

## 2021-01-21 DIAGNOSIS — E785 Hyperlipidemia, unspecified: Secondary | ICD-10-CM | POA: Diagnosis not present

## 2021-01-21 DIAGNOSIS — Z794 Long term (current) use of insulin: Secondary | ICD-10-CM

## 2021-01-21 NOTE — Patient Instructions (Signed)
The [facility] Clinic will be moving around the corner to [address] mid-February. Our phone number will remain the same  Increase your amlodipine to 5 mg every day. Go home and take an additional pill today.  Until you return to the HF clinic, take TWO tablets of your current bottle. When you return to HF clinic we will order the new dosage.  STOP taking your hydrochlorothiazide.   Check your BP three times a week and record on your sheet.

## 2021-01-21 NOTE — Progress Notes (Signed)
Patient ID: Emily [name], female    DOB: Jun 21, 1960, 61 y.o.   MRN: [id number]   Ms Karren is a 61 y/o female with a history of asthma, DM, hyperlipidemia, HTN, stroke, thyroid disease, COPD, sleep apnea, former tobacco use and chronic heart failure.   Echo report from 04/23/20 reviewed and showed an EF of 50-55% with mild LVH. Echo report from 02/13/2018 reviewed and showed an EF of 60-65% without LVH.    Has not been admitted or been in the ED in the last 6 months.    She presents today for a follow-up visit with a chief complaint of moderate shortness of breath upon minimal exertion. She describes this as chronic in nature having been present for several years. She has associated fatigue She denies any difficulty sleeping, dizziness, abdominal distention, pedal edema, chest pain, palpitations, cough or weight gain.   She has been weighing herself daily. Reports her weight does not fluctuate much. Today, she weighs 79.   Past Medical History:  Diagnosis Date   Arthritis    hands, all over   Asthma    CHF (congestive heart failure) (HCC)    COPD (chronic obstructive pulmonary disease) (HCC)    on 2L home o2   Diabetes mellitus without complication (HCC)    type 2   Dyspnea    Hyperlipidemia    Hypertension    Sleep apnea    CPAP   Stroke [facility])    Was told at recent hospital visit she has stroke in past   Thyroid disease    Thyroid nodule 04/30/2016   Past Surgical History:  Procedure Laterality Date   arm surgery Left    COLONOSCOPY WITH PROPOFOL N/A 02/14/2019   Procedure: COLONOSCOPY WITH PROPOFOL;  Surgeon: [name], MD;  Location: [facility] ENDOSCOPY;  Service: Endoscopy;  Laterality: N/A;   Family History  Problem Relation Age of Onset   Diabetes Mother    Hypertension Mother    Diabetes Father    Hypertension Father    CAD Father    Diabetes Sister    Social History   Tobacco Use   Smoking status: Former    Packs/day: 1.50    Years: 40.00    Pack years:  60.00    Types: Cigarettes    Quit date: 01/13/2016    Years since quitting: 5.0   Smokeless tobacco: Never  Substance Use Topics   Alcohol use: No   No Known Allergies  Prior to Admission medications   Medication Sig Start Date End Date Taking? Authorizing Provider  Accu-Chek Softclix Lancets lancets Use as instructed to check blood sugar 3 times a day. E11.59 09/05/19  Yes Philemon Kingdom, MD  Acetylcysteine (NAC) 600 MG CAPS Take by mouth daily.   Yes [provider]  albuterol (PROVENTIL HFA;VENTOLIN HFA) 108 (90 Base) MCG/ACT inhaler Inhale 1-2 puffs into the lungs every 6 (six) hours as needed for wheezing or shortness of breath. 07/16/16  Yes Doles-Johnson, Teah, NP  albuterol (PROVENTIL) (2.5 MG/3ML) 0.083% nebulizer solution Take 3 mLs (2.5 mg total) by nebulization every 6 (six) hours as needed for wheezing or shortness of breath. 11/18/17  Yes Wieting, Richard, MD  amLODipine (NORVASC) 2.5 MG tablet Take 2.5 mg by mouth daily.   Yes [provider]  Armodafinil 150 MG tablet Take 150 mg by mouth daily.   Yes [provider]  aspirin EC 81 MG tablet Take 81 mg by mouth daily.   Yes [provider]  atorvastatin (LIPITOR)  40 MG tablet TAKE ONE TABLET BY MOUTH EVERY DAY Patient taking differently: Take 40 mg by mouth at bedtime. 04/21/16  Yes Tawni Millers, MD  BD PEN NEEDLE MICRO U/F 32G X 6 MM MISC SMARTSIG:Pre-Filled Pen Syringe Injection 3 Times Daily 09/22/19  Yes [provider]  Blood Glucose Monitoring Suppl (ACCU-CHEK GUIDE) w/Device KIT Use as instructed to check blood sugar 3 times a day. E11.59 09/05/19  Yes Philemon Kingdom, MD  budesonide-formoterol [facility]) 80-4.5 MCG/ACT inhaler Inhale 2 puffs into the lungs 2 (two) times daily. 11/18/17  Yes Loletha Grayer, MD  carvedilol (COREG) 25 MG tablet Take 1 tablet by mouth twice daily 04/25/20  Yes Hackney, Tina A, FNP  chlorthalidone (HYGROTON) 25 MG tablet Take 25 mg by mouth  daily.   Yes [provider]  esomeprazole (NEXIUM) 40 MG capsule Take 40 mg by mouth daily. am   Yes [provider]  fluticasone (FLONASE) 50 MCG/ACT nasal spray Place 1 spray into both nostrils daily.   Yes [provider]  glucose blood (ACCU-CHEK GUIDE) test strip Use as instructed to check blood sugar 3 times a day. E11.59 09/05/19  Yes Philemon Kingdom, MD  hydrochlorothiazide (HYDRODIURIL) 50 MG tablet Take 50 mg by mouth daily.   Yes [provider]  insulin glargine (LANTUS SOLOSTAR) 100 UNIT/ML Solostar Pen INJECT 30 UNITS SUBCUTANEOUSLY IN THE MORNING AND 30 UNITS AT NIGHT 03/12/20  Yes Philemon Kingdom, MD  ipratropium (ATROVENT) 0.06 % nasal spray Place 2 sprays into both nostrils 4 (four) times daily.   Yes [provider]  methimazole (TAPAZOLE) 5 MG tablet Take 1 tablet by mouth once daily 05/08/20  Yes Philemon Kingdom, MD  NOVOLOG FLEXPEN 100 UNIT/ML FlexPen INJECT 18-22 UNITS IN THE AM AND 20-24 UNITS IN THE EVENING before meals 04/04/20  Yes Philemon Kingdom, MD  potassium chloride (KLOR-CON) 10 MEQ tablet Take by mouth 2 (two) times daily. 12/13/19 12/12/20 Yes [provider]  Semaglutide, 1 MG/DOSE, (OZEMPIC, 1 MG/DOSE,) 4 MG/3ML SOPN Inject 1 mg into the skin once a week. 10/03/19  Yes Philemon Kingdom, MD  tiotropium (SPIRIVA) 18 MCG inhalation capsule Place 18 mcg into inhaler and inhale daily.   Yes [provider]  Vitamin D, Ergocalciferol, (DRISDOL) 1.25 MG (50000 UNIT) CAPS capsule Take 50,000 Units by mouth every 7 (seven) days.   Yes [provider]    Review of Systems  Constitutional:  Positive for fatigue (easily). Negative for appetite change.  HENT:  Negative for congestion, postnasal drip and sore throat.   Eyes: Negative.   Respiratory:  Positive for shortness of breath (easily). Negative for cough and chest tightness.   Cardiovascular:  Negative for chest pain, palpitations and leg  swelling.  Gastrointestinal:  Negative for abdominal distention and abdominal pain.  Endocrine: Negative.   Genitourinary: Negative.   Musculoskeletal:  Negative for back pain and neck pain.  Skin: Negative.   Allergic/Immunologic: Negative.   Neurological:  Negative for dizziness and light-headedness.  Hematological:  Negative for adenopathy. Does not bruise/bleed easily.  Psychiatric/Behavioral:  Negative for dysphoric mood and sleep disturbance (sleeping on 1 pillow). The patient is not nervous/anxious.    Vitals:   01/21/21 0844  BP: (!) 163/93  Pulse: 100  Resp: 20  SpO2: 93%  Weight: 217 lb 2 oz (98.5 kg)  Height: _0  (1.626 m)   Wt Readings from Last 3 Encounters:  01/21/21 217 lb 2 oz (98.5 kg)  12/16/20 219 lb (99.3 kg)  09/19/20 217 lb (98.4 kg)   Lab Results  Component Value Date   CREATININE 2.65 (H) 04/25/2019   CREATININE 1.87 (H) 04/05/2018   CREATININE 2.46 (H) 02/18/2018   Physical Exam Vitals and nursing note reviewed.  Constitutional:      General: She is not in acute distress.    Appearance: Normal appearance. She is not ill-appearing.  HENT:     Head: Normocephalic and atraumatic.  Cardiovascular:     Rate and Rhythm: Normal rate and regular rhythm.     Heart sounds: Normal heart sounds.  Pulmonary:     Effort: Pulmonary effort is normal. No respiratory distress.     Breath sounds: Normal breath sounds. No wheezing or rales.  Abdominal:     General: There is no distension.     Palpations: Abdomen is soft.     Tenderness: There is no abdominal tenderness.  Musculoskeletal:        General: No tenderness.     Cervical back: Normal range of motion and neck supple.     Left knee: Swelling (medial left knee) present.     Right lower leg: Edema (trace) present.     Left lower leg: Edema (trace) present.  Skin:    General: Skin is warm and dry.  Neurological:     General: No focal deficit present.     Mental Status: She is alert and oriented to  person, place, and time.  Psychiatric:        Mood and Affect: Mood normal.        Behavior: Behavior normal.        Thought Content: Thought content normal.   Assessment & Plan:  1: Chronic heart failure with preserved ejection fraction with structural changes (LVH)-  - NYHA class III - euvolemic today - weighing daily; encouraged to resume daily weighing and reminded to call for an overnight weight gain of >2 pounds or a weekly weight gain of >5 pounds - weight up 3 pounds from last visit here 6 months ago - not adding salt and has been using Mrs. Dash for seasoning. - wearing compression socks daily with removal at bedtime along with compression pumps that she uses "sometimes" - unable to use entresto or SLGT2 due to renal function - saw vascular (Schnier) 12/16/20 - BNP 04/25/19 was 275.0  2: HTN- - BP today 163/93, rechecked 158/95 - she has a BP machine at home, advised to check and record for a month and bring record with her at next visit - will increase amlodipine to 5 mg QD, she just picked up a 90 day supply so she will take 2 pills daily until out - CMP from 10/04/20 reviewed and showed sodium 142, potassium 3.6, creatinine 2.74 and GFR 19  3: COPD- - saw pulmonology Lanney Gins) 04/04/20 - wearing oxygen at 4L around the clock at home; did not bring with her today  4: Obstructive sleep apnea- - wearing CPAP nightly - continues nuvigil and she says that she feels better since resuming it  5: DM with CKD- - fasting glucose at home today was "high"; says that she ate homemade cherry pie yesterday evening - saw endocrinologist Renne Crigler) 09/19/20 - A1c 09/19/20 was 6.8% - saw nephrology ([place]) 04/24/20; returns tomorrow and says that she's recently had lab work done - was on HCTZ and chlorothalidone, provided by two different providers. Will stop HCTZ and continue chlorothalidone. - pharmD reconciled all medications    Medication bottles reviewed.   Return in  1 month or  sooner for any questions/problems before then.

## 2021-01-21 NOTE — Progress Notes (Signed)
Emily [name] - PHARMACIST COUNSELING NOTE  Guideline-Directed Medical Therapy/Evidence Based Medicine  *HFpEF*  ACE/ARB/ARNI: N/A Beta Blocker: Carvedilol 25 mg twice daily Aldosterone Antagonist:  n/a Diuretic: Chlorthalidone 25 mg daily  & HCTZ 19m?? SGLT2i:  n/a  ; calculated eGFR < 20  Adherence Assessment  Do you ever forget to take your medication? '[]' Yes '[x]' No  Do you ever skip doses due to side effects? '[]' Yes '[x]' No  Do you have trouble affording your medicines? '[]' Yes '[x]' No  Are you ever unable to pick up your medication due to transportation difficulties? '[]' Yes '[x]' No  Do you ever stop taking your medications because you don't believe they are helping? '[]' Yes '[x]' No  Do you check your weight daily? '[]' Yes '[x]' No   Adherence strategy: Patient used pill box   Barriers to obtaining medications: none  Vital signs: HR 100, BP 163/93, weight (pounds) 217.2  Diet: Her mother cooks all meals. Occasional take out from [employer] Avoid adding salt to already cooked meals.  ECHO: Date 04/23/2020, EF 50-55%, The left  ventricular internal cavity size was mildly dilated. There is mild left  ventricular hypertrophy.  BMP Latest Ref Rng & Units 04/25/2019 04/05/2018 02/18/2018  Glucose 70 - 99 mg/dL 92 180(H) -  BUN 6 - 20 mg/dL 30(H) 33(H) -  Creatinine 0.44 - 1.00 mg/dL 2.65(H) 1.87(H) 2.46(H)  BUN/Creat Ratio 6 - 22 (calc) - 18 -  Sodium 135 - 145 mmol/L 144 143 -  Potassium 3.5 - 5.1 mmol/L 4.9 3.9 -  Chloride 98 - 111 mmol/L 110 105 -  CO2 22 - 32 mmol/L 28 28 -  Calcium 8.9 - 10.3 mg/dL 8.5(L) 9.1 -    Past Medical History:  Diagnosis Date   Arthritis    hands, all over   Asthma    CHF (congestive heart failure) (HCC)    COPD (chronic obstructive pulmonary disease) (HCC)    on 2L home o2   Diabetes mellitus without complication (HCC)    type 2   Dyspnea    Hyperlipidemia    Hypertension    Sleep apnea    CPAP   Stroke  ([facility]    Was told at recent hospital visit she has stroke in past   Thyroid disease    Thyroid nodule 04/30/2016    ASSESSMENT 61year old female who presents to the HF clinic for follow up. Noted not on ACEi/ARB/ARNI or aldactone antagonist d/t CKD follow by nephrologist. Patient is taking HCTZ 590mplus Chlorthalidone 2570maily per medication reconciliation. She denies swelling, SOB, falls or dizziness. Patient does not check her BP frequently , but has cuff available at home. Continues to moderate sodium intake in diet and reports most meal cooked at home. Her blood pressure remains elevated at initial presentation and repeat during visit.  Recent ED Visit (past 6 months): Date - 08/12/20, [facility] - Trapezius strain  PLAN -  discussed with provider STOP HCTZ Continue taking chlorthalidone 58m46mily Increase amlodipine to 5mg 39mly Monitor BP for 30 days and keep log Send message to nephrologist to consider adding valsartan 80mg 27my to current regimen once renal function stable. Will provide additional renal and cardiovascular protection in additional to BP control Avoid using naproxen or NSAIDS for pain management. Okay to use APAP as needed. Consider adding hydralazine 58mg B80mID in additional BP control needed   Time spent: 15 minutes  Emily [name] PharmD, BCPS 01/21/2021 7:36 AM    Current Outpatient Medications:  ACCU-CHEK GUIDE test strip, USE TO CHECK BLOOD SUGAR THREE TIMES DAILY, Disp: 200 each, Rfl: prn   Accu-Chek Softclix Lancets lancets, Use as instructed to check blood sugar 3 times a day. E11.59, Disp: 300 each, Rfl: 12   Acetylcysteine (NAC) 600 MG CAPS, Take by mouth daily., Disp: , Rfl:    Acetylcysteine 600 MG CAPS, Take 1 capsule by mouth daily., Disp: , Rfl:    albuterol (PROVENTIL HFA;VENTOLIN HFA) 108 (90 Base) MCG/ACT inhaler, Inhale 1-2 puffs into the lungs every 6 (six) hours as needed for wheezing or shortness of breath., Disp: 1 Inhaler, Rfl:  11   albuterol (PROVENTIL) (2.5 MG/3ML) 0.083% nebulizer solution, Take 3 mLs (2.5 mg total) by nebulization every 6 (six) hours as needed for wheezing or shortness of breath., Disp: 100 vial, Rfl: 0   amLODipine (NORVASC) 2.5 MG tablet, Take 2.5 mg by mouth daily., Disp: , Rfl:    amLODipine (NORVASC) 2.5 MG tablet, Take 1 tablet by mouth daily., Disp: , Rfl:    Armodafinil 150 MG tablet, Take 150 mg by mouth daily., Disp: , Rfl:    aspirin EC 81 MG tablet, Take 81 mg by mouth daily., Disp: , Rfl:    atorvastatin (LIPITOR) 40 MG tablet, TAKE ONE TABLET BY MOUTH EVERY DAY (Patient taking differently: Take 40 mg by mouth at bedtime.), Disp: 90 tablet, Rfl: PRN   BD PEN NEEDLE MICRO U/F 32G X 6 MM MISC, SMARTSIG:Pre-Filled Pen Syringe Injection 3 Times Daily, Disp: , Rfl:    Blood Glucose Monitoring Suppl (ACCU-CHEK GUIDE) w/Device KIT, Use as instructed to check blood sugar 3 times a day. E11.59, Disp: 1 kit, Rfl: 0   budesonide-formoterol (SYMBICORT) 80-4.5 MCG/ACT inhaler, Inhale 2 puffs into the lungs 2 (two) times daily., Disp: 1 Inhaler, Rfl: 12   carvedilol (COREG) 25 MG tablet, Take 1 tablet by mouth twice daily, Disp: 180 tablet, Rfl: 3   chlorthalidone (HYGROTON) 25 MG tablet, Take 25 mg by mouth daily., Disp: , Rfl:    cyclobenzaprine (FLEXERIL) 10 MG tablet, Take 1 tablet by mouth 3 (three) times daily as needed., Disp: , Rfl:    esomeprazole (NEXIUM) 40 MG capsule, Take 40 mg by mouth daily. am, Disp: , Rfl:    fluticasone (FLONASE) 50 MCG/ACT nasal spray, Place 1 spray into both nostrils daily., Disp: , Rfl:    hydrochlorothiazide (HYDRODIURIL) 50 MG tablet, Take 50 mg by mouth daily., Disp: , Rfl:    insulin glargine (LANTUS SOLOSTAR) 100 UNIT/ML Solostar Pen, INJECT 30 UNITS SUBCUTANEOUSLY IN THE MORNING AND 30 UNITS AT NIGHT, Disp: 45 mL, Rfl: 3   ipratropium (ATROVENT) 0.06 % nasal spray, Place 2 sprays into both nostrils 4 (four) times daily., Disp: , Rfl:    ipratropium (ATROVENT)  0.06 % nasal spray, Place into the nose., Disp: , Rfl:    methimazole (TAPAZOLE) 5 MG tablet, Take 1 tablet (5 mg total) by mouth daily., Disp: 90 tablet, Rfl: 3   naproxen (NAPROSYN) 500 MG tablet, Take 1 tablet (500 mg total) by mouth 2 (two) times daily with a meal., Disp: 30 tablet, Rfl: 0   naproxen (NAPROSYN) 500 MG tablet, Take 1 tablet by mouth 2 (two) times daily with a meal., Disp: , Rfl:    NOVOLOG FLEXPEN 100 UNIT/ML FlexPen, Inject 18-24 units 2x a day before meals, Disp: 45 mL, Rfl: 3   Semaglutide, 1 MG/DOSE, (OZEMPIC, 1 MG/DOSE,) 4 MG/3ML SOPN, Inject 1 mg into the skin once a week., Disp: 9 mL,  Rfl: 3   tiotropium (SPIRIVA) 18 MCG inhalation capsule, Place 18 mcg into inhaler and inhale daily., Disp: , Rfl:    tiZANidine (ZANAFLEX) 4 MG tablet, Take 1 tablet (4 mg total) by mouth 3 (three) times daily., Disp: 30 tablet, Rfl: 0   tiZANidine (ZANAFLEX) 4 MG tablet, Take by mouth., Disp: , Rfl:    Vitamin D, Ergocalciferol, (DRISDOL) 1.25 MG (50000 UNIT) CAPS capsule, Take 50,000 Units by mouth every 7 (seven) days., Disp: , Rfl:    COUNSELING POINTS/CLINICAL PEARLS    DRUGS TO CAUTION IN HEART FAILURE  Drug or Class Mechanism  Analgesics NSAIDs COX-2 inhibitors Glucocorticoids  Sodium and water retention, increased systemic vascular resistance, decreased response to diuretics   Diabetes Medications Metformin Thiazolidinediones Rosiglitazone (Avandia) Pioglitazone (Actos) DPP4 Inhibitors Saxagliptin (Onglyza) Sitagliptin (Januvia)   Lactic acidosis Possible calcium channel blockade   Unknown  Antiarrhythmics Class I  Flecainide Disopyramide Class III Sotalol Other Dronedarone  Negative inotrope, proarrhythmic   Proarrhythmic, beta blockade  Negative inotrope  Antihypertensives Alpha Blockers Doxazosin Calcium Channel Blockers Diltiazem Verapamil Nifedipine Central Alpha Adrenergics Moxonidine Peripheral Vasodilators Minoxidil  Increases  renin and aldosterone  Negative inotrope    Possible sympathetic withdrawal  Unknown  Anti-infective Itraconazole Amphotericin B  Negative inotrope Unknown  Hematologic Anagrelide Cilostazol   Possible inhibition of PD IV Inhibition of PD III causing arrhythmias  Neurologic/Psychiatric Stimulants Anti-Seizure Drugs Carbamazepine Pregabalin Antidepressants Tricyclics Citalopram Parkinsons Bromocriptine Pergolide Pramipexole Antipsychotics Clozapine Antimigraine Ergotamine Methysergide Appetite suppressants Bipolar Lithium  Peripheral alpha and beta agonist activity  Negative inotrope and chronotrope Calcium channel blockade  Negative inotrope, proarrhythmic Dose-dependent QT prolongation  Excessive serotonin activity/valvular damage Excessive serotonin activity/valvular damage Unknown  IgE mediated hypersensitivy, calcium channel blockade  Excessive serotonin activity/valvular damage Excessive serotonin activity/valvular damage Valvular damage  Direct myofibrillar degeneration, adrenergic stimulation  Antimalarials Chloroquine Hydroxychloroquine Intracellular inhibition of lysosomal enzymes  Urologic Agents Alpha Blockers Doxazosin Prazosin Tamsulosin Terazosin  Increased renin and aldosterone  Adapted from Page Carleene Overlie, et al. Drugs That May Cause or Exacerbate Heart Failure: A Scientific Statement from the American Heart  Association. Circulation 2016; 134:e32-e69. DOI: 10.1161/CIR.0000000000000426   MEDICATION ADHERENCES TIPS AND STRATEGIES Taking medication as prescribed improves patient outcomes in heart failure (reduces hospitalizations, improves symptoms, increases survival) Side effects of medications can be managed by decreasing doses, switching agents, stopping drugs, or adding additional therapy. Please let someone in the [facility] know if you have having bothersome side effects so we can modify your regimen. Do not alter  your medication regimen without talking to [place].  Medication reminders can help patients remember to take drugs on time. If you are missing or forgetting doses you can try linking behaviors, using pill boxes, or an electronic reminder like an alarm on your phone or an app. Some people can also get automated phone calls as medication reminders.

## 2021-01-23 ENCOUNTER — Ambulatory Visit (INDEPENDENT_AMBULATORY_CARE_PROVIDER_SITE_OTHER): Payer: Medicaid Other | Admitting: Internal Medicine

## 2021-01-23 ENCOUNTER — Other Ambulatory Visit: Payer: Self-pay

## 2021-01-23 ENCOUNTER — Encounter: Payer: Self-pay | Admitting: Internal Medicine

## 2021-01-23 VITALS — BP 138/90 | HR 91 | Ht 64.0 in | Wt 221.2 lb

## 2021-01-23 DIAGNOSIS — E1165 Type 2 diabetes mellitus with hyperglycemia: Secondary | ICD-10-CM | POA: Diagnosis not present

## 2021-01-23 DIAGNOSIS — E785 Hyperlipidemia, unspecified: Secondary | ICD-10-CM | POA: Diagnosis not present

## 2021-01-23 DIAGNOSIS — E041 Nontoxic single thyroid nodule: Secondary | ICD-10-CM | POA: Diagnosis not present

## 2021-01-23 DIAGNOSIS — E0590 Thyrotoxicosis, unspecified without thyrotoxic crisis or storm: Secondary | ICD-10-CM

## 2021-01-23 DIAGNOSIS — E1159 Type 2 diabetes mellitus with other circulatory complications: Secondary | ICD-10-CM

## 2021-01-23 LAB — POCT GLYCOSYLATED HEMOGLOBIN (HGB A1C): Hemoglobin A1C: 10.5 % — AB (ref 4.0–5.6)

## 2021-01-23 NOTE — Patient Instructions (Addendum)
STOP SODAS or ANY SWEET DRINKS!!!  Please increase: - Lantus 35 units in a.m. and 35 units at bedtime - NovoLog 20-24 units before breakfast and 24-28 units before dinner    Continue: - Ozempic 1 mg weekly  Continue methimazole 5 mg daily.  Please return in 3-4 months with your sugar log.

## 2021-01-23 NOTE — Progress Notes (Signed)
Patient ID: Emily [name], female   DOB: [birth year], 61 y.o.   MRN: [id number]  This visit occurred during the SARS-CoV-2 public health emergency.  Safety protocols were in place, including screening questions prior to the visit, additional usage of staff PPE, and extensive cleaning of exam room while observing appropriate contact time as indicated for disinfecting solutions.   HPI  Emily [name] is a 61 y.o.-year-old female, returning for f/u for thyrotoxicosis/goiter and DM2, uncontrolled, insulin-dependent, with complications (CKD stage 3, cerebro-vascular ds. - s/p stroke). Last visit 6 months ago.  Interim history: No increased urination, blurry vision, nausea, chest pain. Also, no neck compression symptoms. Still drinking regular sodas but she tells me she is not drinking as much as before.  TMNG/Graves ds.: Reviewed and addended history: Pt was dx'ed with hyperthyroidism in ~2014 >> started MMI, lately 10 mg 2x daily. She was lost for f/u at [facility] as she could not travel over there.  Thyroid uptake and scan (06/22/2013) showed: FINDINGS: The 24 hour uptake was 24.7%. The normal reference range at 24 hours is 15 to 35%.   The thyroid scan demonstrates symmetric, mildly heterogenous distribution of radiopharmaceutical throughout the thyroid, which is increased in size.  IMPRESSION: Mildly heterogenous iodine uptake, cannot exclude toxic multinodular goiter versus possible mild Graves disease. Consider ultrasound for further correlation.  Patient was on methimazole before, however, this was stopped after  TSH returned very elevated, in the 90s (02/2015) >> TSH decreased to 8, then normalized >> we continued her off MMI.   However,she was started on MMI 10 mg 2x a day (re-Rx by Dr. Rosanna Randy: 04/17/2016, [facility]) - I do not think he was aware that we had stopped MMI.  We stopped methimazole again in 05/2016.  However, we had to restart again methimazole in 05/2017.  She  was on 5 mg daily, but this was stopped during the hospitalization for COPD and CHF exacerbation 02/2018.  In 12/2018 we had to restart methimazole 5 mg daily.  In 09/2019, we called her to decrease the dose to 2.5 mg daily, but she did not get the msg >> still on 5 mg daily.  In 12/2019, we decreased the methimazole dose to 2.5 mg daily.  In 09/2020, we stopped methimazole.  In 12/2020, we restarted methimazole 5 mg daily.  Reviewed her TFTs: Lab Results  Component Value Date   TSH 0.56 01/09/2021   TSH 0.05 (L) 12/05/2020   TSH 0.21 (L) 10/29/2020   TSH 1.44 09/19/2020   TSH 2.12 03/12/2020   TSH 0.49 02/02/2020   TSH 3.80 12/12/2019   TSH 4.37 10/03/2019   TSH 2.99 06/13/2019   TSH 2.09 03/06/2019   FREET4 0.63 01/09/2021   FREET4 0.83 12/05/2020   FREET4 0.82 10/29/2020   FREET4 0.59 (L) 09/19/2020   FREET4 0.61 03/12/2020   FREET4 0.80 02/02/2020   FREET4 0.70 12/12/2019   FREET4 0.57 (L) 10/03/2019   FREET4 0.66 06/13/2019   FREET4 0.75 03/06/2019   Previously:   Thyroid U/S (05/12/2016): Large isthmic nodule Thyroid tissue is diffusely heterogeneous and enlarged.  The isthmus has a large isoechoic nodular structure. Unclear if this represents a discrete nodule versus focal heterogeneous thyroid tissue. This area measures up to 3.3 cm and recommend biopsy of this area.  Numerous small nodules and cysts throughout the left and right thyroid lobes. These predominantly cystic nodules do not meet criteria for biopsy or dedicated follow-up.  FNA of the isthmic nodule (06/25/2016): Benign  Neck U/S (  05/07/2020): Parenchymal Echotexture: Moderately heterogenous Isthmus: 0.9 cm thickness, previously 1.2 Right lobe: 9.9 x 3.9 x 3.4 cm, previously 8.4 x 2.8 x 4.5 Left lobe: 8.6 x 3 x 3.3 cm, previously 8.2 x 3 x 3.7 _________________________________________________________  Nodule # 1: 3.6 x 3.4 x 1.7 cm isthmic nodule, previously 3.3 x 2.7 x 1.7; this was  previously biopsied.  Nodule # 2: Prior biopsy: No Location: Right; superior Maximum size: 1.2 cm; Other 2 dimensions: 0.8 x 0.7 cm, previously, 1 x 0.9 x 0.7 cm Composition: solid/almost completely solid (2) Echogenicity: hypoechoic (2) *Given size (>/= 1 - 1.4 cm) and appearance, a follow-up ultrasound in 1 year should be considered based on TI-RADS criteria. _________________________________________________________  Nodule # 3: Prior biopsy: No Location: Right; superior posterior Maximum size: 1.6 cm; Other 2 dimensions: 1.4 x 1 cm, previously, 1.6 x 1.3 x 1.3 cm Composition: mixed cystic and solid (1) Echogenicity: hypoechoic (2) *Given size (>/= 1.5 - 2.4 cm) and appearance, a follow-up ultrasound in 1 year should be considered based on TI-RADS criteria. _________________________________________________________  Nodule # 4: 1.3 cm benign colloid cyst, mid right  Nodule # 5: 1.1 cm benign colloid cyst, inferior medial right  Nodule # 6: 1.1 cm benign colloid cyst, inferior  right  Nodule # 7: Prior biopsy: No Location: Left; mid Maximum size: 2.2 cm; Other 2 dimensions: 1.4 x 0.8 cm, previously, 0.9 x 0.8 x 0.7 cm Composition: mixed cystic and solid (1) Echogenicity: hypoechoic (2) Significant change in size (>/= 20% in two dimensions and minimal increase of 2 mm): Yes *Given size (>/= 1.5 - 2.4 cm) and appearance, a follow-up ultrasound in 1 year should be considered based on TI-RADS criteria. _________________________________________________________  Nodule # 8: Prior biopsy: No Location: Left; inferior Maximum size: 1.3 cm; Other 2 dimensions: 1.1 x 0.5 cm, previously, 1.3 x 1 x 0.7 cm Composition: spongiform (0) Echogenicity: hypoechoic (2) This nodule does NOT meet TI-RADS criteria for biopsy or dedicated follow-up. ________________________________________________________  Nodule # 9: 1.3 cm spongiform nodule, inferior left, previously 2 cm; This nodule does  NOT meet TI-RADS criteria for biopsy or dedicated follow-up.  Nodule # 10: 1.1 cm complex cyst without calcifications, inferior left; This nodule does NOT meet TI-RADS criteria for biopsy or dedicated follow-up.  IMPRESSION: 1. Thyromegaly with bilateral nodules. None currently meets criteria for biopsy. 2. Recommend annual/biennial ultrasound follow-up of nodules as above, until stability x5 years confirmed.  Pt denies: - feeling nodules in neck - hoarseness - dysphagia - choking - SOB with lying down  Pt does have a FH of thyroid ds.: aunt. No FH of thyroid cancer. No h/o radiation tx to head or neck.  No herbal supplements. No Biotin use. No recent steroids use.   DM2: She has a history of noncompliance with medications. She had several complications: History of stroke, also CKD, DR.  Reviewed HbA1c levels: Lab Results  Component Value Date   HGBA1C 6.8 (A) 09/19/2020   HGBA1C 8.2 (A) 03/12/2020   HGBA1C 7.1 (A) 12/12/2019  10/03/2019: HbA1c 7.4% but HbA1c calculated from fructosamine is 5.4%.  She was on: - Lantus 45 units 2x a day >> was off insulin - "the pharmacy did not receive it" - Januvia 50 mg in am >> stopped (?) - Glipizide 10 mg 2x a day >> started in the hospital We added Jardiance at last visit >> not taking  - "the pharmacy did not receive it" She was on Metformin >> diarrhea.  Now on: -  Ozempic 0.5 mg weekly restarted 07/2019 >> 1 mg weekly  - Lantus 50 >> 30 units twice a day - NovoLog 16 units before breakfast and  20 units before dinner >> 18-20 units before breakfast and 22-24 units before dinner  We had to start Jardiance 10 mg 02/2018 due to decreased kidney function. Previously on Trulicity.  She is checking sugars twice a day: - am: 75-HI >> 82-181 >> 77, 141-273 >> 91-179, 201, HI >> 121, 165-HI - 2h after b'fast: n/c - lunch: n/c - 2h after lunch: n/c >> 178 >> 121 >> 128 >> n/c - dinner: 110-146 >> n/c >> 83-136 >> 165 >> n/c - 2h after  dinner:  95-137, 226 >> 220-257 >> 111-140 >> 144 - bedtime:  99-181, 207 >> 156-265 >> 108-149, 198, HI >> 156-205 - nighttime: 79, 104-218, 223 >> 191-381 >> 73-254 >> 150-HI Lowest: 82 >> 77 >> 117 Highest: 381 >> 226 >> 381 >> HI  + CKD; latest BUN/Cr: 07/10/2020: 32/3.02, GFR 17 Lab Results  Component Value Date   BUN 30 (H) 04/25/2019   Lab Results  Component Value Date   CREATININE 2.65 (H) 04/25/2019  GFR 24 (02/18/2018)  + HL; latest lipid panel: Lab Results  Component Value Date   CHOL 145 09/19/2020   HDL 43.60 09/19/2020   LDLCALC 67 07/27/2019   LDLDIRECT 74.0 09/19/2020   TRIG 212.0 (H) 09/19/2020   CHOLHDL 3 09/19/2020  On Lipitor 40.  Latest dilated eye exam was in 2022: + DR reportedly.  ROS: + See HPI  I reviewed pt's medications, allergies, PMH, social hx, family hx, and changes were documented in the history of present illness. Otherwise, unchanged from my initial visit note.  Past Medical History:  Diagnosis Date   Arthritis    hands, all over   Asthma    CHF (congestive heart failure) (HCC)    COPD (chronic obstructive pulmonary disease) (HCC)    on 2L home o2   Diabetes mellitus without complication (HCC)    type 2   Dyspnea    Hyperlipidemia    Hypertension    Sleep apnea    CPAP   Stroke [facility])    Was told at recent hospital visit she has stroke in past   Thyroid disease    Thyroid nodule 04/30/2016   Past Surgical History:  Procedure Laterality Date   arm surgery Left    COLONOSCOPY WITH PROPOFOL N/A 02/14/2019   Procedure: COLONOSCOPY WITH PROPOFOL;  Surgeon: [name], MD;  Location: [facility] ENDOSCOPY;  Service: Endoscopy;  Laterality: N/A;   Social History   Social History   Marital Status: Single    Spouse Name: N/A   Number of Children: 1   Occupational History   None   Social History Main Topics   Smoking status: Current Every Day Smoker -- 1.00 packs/day   Smokeless tobacco: Not on file   Alcohol Use: No   Drug Use:  No   Current Outpatient Medications on File Prior to Visit  Medication Sig Dispense Refill   ACCU-CHEK GUIDE test strip USE TO CHECK BLOOD SUGAR THREE TIMES DAILY 200 each prn   Accu-Chek Softclix Lancets lancets Use as instructed to check blood sugar 3 times a day. E11.59 300 each 12   Acetylcysteine 600 MG CAPS Take 1 capsule by mouth daily.     albuterol (PROVENTIL HFA;VENTOLIN HFA) 108 (90 Base) MCG/ACT inhaler Inhale 1-2 puffs into the lungs every 6 (six) hours as needed for  wheezing or shortness of breath. 1 Inhaler 11   albuterol (PROVENTIL) (2.5 MG/3ML) 0.083% nebulizer solution Take 3 mLs (2.5 mg total) by nebulization every 6 (six) hours as needed for wheezing or shortness of breath. 100 vial 0   amLODipine (NORVASC) 2.5 MG tablet Take 2 tablets by mouth daily.     Armodafinil 50 MG tablet Take 50 mg by mouth daily.     aspirin EC 81 MG tablet Take 81 mg by mouth daily.     atorvastatin (LIPITOR) 40 MG tablet TAKE ONE TABLET BY MOUTH EVERY DAY (Patient taking differently: Take 40 mg by mouth at bedtime.) 90 tablet PRN   BD PEN NEEDLE MICRO U/F 32G X 6 MM MISC SMARTSIG:Pre-Filled Pen Syringe Injection 3 Times Daily     Blood Glucose Monitoring Suppl (ACCU-CHEK GUIDE) w/Device KIT Use as instructed to check blood sugar 3 times a day. E11.59 1 kit 0   budesonide-formoterol (SYMBICORT) 80-4.5 MCG/ACT inhaler Inhale 2 puffs into the lungs 2 (two) times daily. 1 Inhaler 12   carvedilol (COREG) 25 MG tablet Take 1 tablet by mouth twice daily 180 tablet 3   chlorthalidone (HYGROTON) 25 MG tablet Take 25 mg by mouth daily.     cyclobenzaprine (FLEXERIL) 10 MG tablet Take 1 tablet by mouth 3 (three) times daily as needed.     esomeprazole (NEXIUM) 40 MG capsule Take 40 mg by mouth daily. am     fluticasone (FLONASE) 50 MCG/ACT nasal spray Place 1 spray into both nostrils daily.     insulin glargine (LANTUS SOLOSTAR) 100 UNIT/ML Solostar Pen INJECT 30 UNITS SUBCUTANEOUSLY IN THE MORNING AND 30  UNITS AT NIGHT 45 mL 3   ipratropium (ATROVENT) 0.06 % nasal spray Place 2 sprays into both nostrils 4 (four) times daily.     methimazole (TAPAZOLE) 5 MG tablet Take 1 tablet (5 mg total) by mouth daily. 90 tablet 3   NOVOLOG FLEXPEN 100 UNIT/ML FlexPen Inject 18-24 units 2x a day before meals 45 mL 3   potassium chloride (MICRO-K) 10 MEQ CR capsule Take 10 mEq by mouth 2 (two) times daily.     Semaglutide, 1 MG/DOSE, (OZEMPIC, 1 MG/DOSE,) 4 MG/3ML SOPN Inject 1 mg into the skin once a week. (Patient taking differently: Inject 1 mg into the skin once a week.) 9 mL 3   tiotropium (SPIRIVA) 18 MCG inhalation capsule Place 18 mcg into inhaler and inhale daily.     tiZANidine (ZANAFLEX) 4 MG tablet Take 1 tablet (4 mg total) by mouth 3 (three) times daily. 30 tablet 0   Vitamin D, Ergocalciferol, (DRISDOL) 1.25 MG (50000 UNIT) CAPS capsule Take 50,000 Units by mouth every 7 (seven) days.     No current facility-administered medications on file prior to visit.   No Known Allergies Family History  Problem Relation Age of Onset   Diabetes Mother    Hypertension Mother    Diabetes Father    Hypertension Father    CAD Father    Diabetes Sister    PE: There were no vitals taken for this visit. There is no height or weight on file to calculate BMI.  Wt Readings from Last 3 Encounters:  01/21/21 217 lb 2 oz (98.5 kg)  12/16/20 219 lb (99.3 kg)  09/19/20 217 lb (98.4 kg)   Constitutional: overweight, in NAD Eyes: PERRLA, EOMI, no exophthalmos ENT: moist mucous membranes, +L>R thyromegaly, no cervical lymphadenopathy Cardiovascular: Tachycardia, RR, No MRG, + mild L>R LE edema Respiratory: CTA B Musculoskeletal:  no deformities, strength intact in all 4 Skin: moist, warm, no rashes Neurological: no tremor with outstretched hands, DTR normal in all 4  ASSESSMENT: 1. Thyrotoxicosis  2. Large goiter  Thyroid U/S (05/12/2016):  Parenchymal Echotexture: Moderately heterogenous Isthmus: 1.2  cm in the AP dimension Right lobe: 8.4 x 2.8 x 4.5 cm Left lobe: 8.2 x 3.0 x 3.7 cm ______________________________________________  Nodule # 1: Location: Isthmus; Inferior Maximum size: 3.3 cm; Other 2 dimensions: 1.7 x 2.7 cm Composition: solid/almost completely solid (2) Echogenicity: isoechoic (1)  **Given size (>/= 2.5 cm) and appearance, fine needle aspiration of this mildly suspicious nodule should be considered based on TI-RADS criteria. _______________________________________________________  Mildly complex cystic structure in the superior right thyroid lobe measuring up to 1.0 cm. Minimally complex cyst in the superior right thyroid lobe measuring up to 1.6 cm. Adjacent complex cysts or spongiform nodules in the mid right thyroid lobe, largest measuring 1.1 cm. Spongiform nodule in the mid left thyroid lobe measuring up to 1.3 cm.  Nodule # 2: Location: Left; Mid Maximum size: 2.0 cm; Other 2 dimensions: 0.8 x 0.8 cm Composition: mixed cystic and solid (1) Echogenicity: cannot determine (1)  This nodule does NOT meet TI-RADS criteria for biopsy or dedicated follow-up. _______________________________________________________  IMPRESSION: Thyroid tissue is diffusely heterogeneous and enlarged.  The isthmus has a large isoechoic nodular structure. Unclear if this represents a discrete nodule versus focal heterogeneous thyroid tissue. This area measures up to 3.3 cm and recommend biopsy of this area.  Numerous small nodules and cysts throughout the left and right thyroid lobes. These predominantly cystic nodules do not meet criteria for biopsy or dedicated follow-up.  The above is in keeping with the ACR TI-RADS recommendations - J Am Coll Radiol 2017;14:587-595.   Electronically Signed   By: Markus Daft M.D.   On: 05/12/2016 12:03   FNA (06/25/2016): Adequacy Reason Satisfactory For Evaluation. Diagnosis THYROID, FINE NEEDLE ASPIRATION ISTHMUS INFERIOR  (SPECIMEN 1 OF 1, COLLECTED ON 06/25/2016): CONSISTENT WITH BENIGN FOLLICULAR NODULE (B[place]ESDA CATEGORY II). Casimer Lanius MD Pathologist, Electronic Signature (Case signed 06/26/2016) Specimen Clinical Information Nodule 1 Isthmus Inferior 3.3 cm; Other 2 dimensions: 1.7 x 2.7 cm, Solid / almost completely solid, Isoechoic, ACR TI-RADS total points: 3, Midly suspicious nodule Source Thyroid, Fine Needle Aspiration, Isthmus Inferior, (Specimen 1 of 1, colected on 06/25/16 )  - also see HPI  3. DM2, insulin dependent, uncontrolled  4. HL  PLAN:  1. And 2.  -Patient with history of thyrotoxicosis, either due to mild Graves' disease or mild toxic multinodular goiter per review of her thyroid uptake and scan report -At last visit, TSH was normal.  We stopped methimazole 2.5 mg daily at that time.  However, TSH became suppressed again so we had to restart methimazole 5 mg daily in 12/2020. -She had a repeat set of TFTs on 01/09/2021 which were normal.    2. Large goiter -She continues to have some neck compression symptoms, but minimal and not bothersome -The dominant nodule was biopsied in 2018 with benign results.  This was stable on the latest ultrasound from 05/2020. -He will need to repeat the thyroid ultrasound this year -we will order this at next visit  3. DM2 -Patient with longstanding, previously fairly well-controlled type 2 diabetes, but worse at last check in 03/2020, when an HbA1c returned 8.2% and her blood sugars are frequently above 200s and 300s.  She was drinking regular sodas at that time and I strongly advised her to  stop.  However, at last visit, she was still drinking 2 sodas a day.  We discussed about the many negative effects of this on her body.  Her kidneys are already failing with the latest GFR being 17.  She was also on large doses of insulin, she gained weight and blood pressure was above goal.  I again strongly recommended to stop any sweet drinks.  We did not change  her regimen at that time.  HbA1c was 6.8%, at goal. -At today's visit, sugars are much higher than before, and unfortunately, patient does not know why.  She is not writing down possible reasons for the high blood sugars in her log and she does not feel she changed much in her diet since last visit.  She does mention that she is drinking less sodas, but she did not stop them completely.  It seems that the highest blood sugars are at that time or in the middle of the night and I think that she is either eating or drinking something sweet after dinner.  I advised her to look very carefully at her diet and identify the potential culprits.  In the meantime, I advised her to go ahead and increase both Lantus and NovoLog.  We will continue with the current dose of Ozempic, 1 mg weekly.  She tolerates this well. - I advised her to: Patient Instructions  STOP SODAS or ANY SWEET DRINKS!!!  Please increase: - Lantus 35 units in a.m. and 35 units at bedtime - NovoLog 20-24 units before breakfast and 24-28 units before dinner    Continue: - Ozempic 1 mg weekly  Continue methimazole 5 mg daily.  Please return in 3-4 months with your sugar log.   - we checked her HbA1c: 10.5% (higher) - advised to check sugars at different times of the day - 3x a day, rotating check times - advised for yearly eye exams >> she is not UTD-I previously printed the telephone number for [place] where she went before, in 2020 and advised her to call her and schedule a new appointment - return to clinic in 3-4 months  4. HL -Reviewed latest lipid panel from 09/2020: Fractions at goal with the exception of a high triglyceride level: Lab Results  Component Value Date   CHOL 145 09/19/2020   HDL 43.60 09/19/2020   LDLCALC 67 07/27/2019   LDLDIRECT 74.0 09/19/2020   TRIG 212.0 (H) 09/19/2020   CHOLHDL 3 09/19/2020  -Continues atorvastatin 40 mg daily without side effects  Philemon Kingdom, MD PhD [facility]  Endocrinology

## 2021-02-21 ENCOUNTER — Other Ambulatory Visit: Payer: Self-pay

## 2021-02-21 ENCOUNTER — Other Ambulatory Visit
Admission: RE | Admit: 2021-02-21 | Discharge: 2021-02-21 | Disposition: A | Discharge status: Home / Self Care | Payer: Medicaid Other | Source: Ambulatory Visit | Attending: Family | Admitting: Family

## 2021-02-21 ENCOUNTER — Encounter: Payer: Self-pay | Admitting: Family

## 2021-02-21 ENCOUNTER — Telehealth: Payer: Self-pay | Admitting: Family

## 2021-02-21 ENCOUNTER — Ambulatory Visit (HOSPITAL_BASED_OUTPATIENT_CLINIC_OR_DEPARTMENT_OTHER): Payer: Medicaid Other | Admitting: Family

## 2021-02-21 VITALS — BP 154/77 | HR 95 | Resp 18 | Ht 64.0 in | Wt 221.1 lb

## 2021-02-21 DIAGNOSIS — E1122 Type 2 diabetes mellitus with diabetic chronic kidney disease: Secondary | ICD-10-CM

## 2021-02-21 DIAGNOSIS — G4733 Obstructive sleep apnea (adult) (pediatric): Secondary | ICD-10-CM

## 2021-02-21 DIAGNOSIS — I5032 Chronic diastolic (congestive) heart failure: Secondary | ICD-10-CM | POA: Insufficient documentation

## 2021-02-21 DIAGNOSIS — E785 Hyperlipidemia, unspecified: Secondary | ICD-10-CM | POA: Insufficient documentation

## 2021-02-21 DIAGNOSIS — Z79899 Other long term (current) drug therapy: Secondary | ICD-10-CM | POA: Diagnosis not present

## 2021-02-21 DIAGNOSIS — Z87891 Personal history of nicotine dependence: Secondary | ICD-10-CM | POA: Insufficient documentation

## 2021-02-21 DIAGNOSIS — E079 Disorder of thyroid, unspecified: Secondary | ICD-10-CM | POA: Insufficient documentation

## 2021-02-21 DIAGNOSIS — J449 Chronic obstructive pulmonary disease, unspecified: Secondary | ICD-10-CM

## 2021-02-21 DIAGNOSIS — Z8673 Personal history of transient ischemic attack (TIA), and cerebral infarction without residual deficits: Secondary | ICD-10-CM | POA: Insufficient documentation

## 2021-02-21 DIAGNOSIS — N189 Chronic kidney disease, unspecified: Secondary | ICD-10-CM | POA: Diagnosis not present

## 2021-02-21 DIAGNOSIS — I10 Essential (primary) hypertension: Secondary | ICD-10-CM | POA: Diagnosis not present

## 2021-02-21 DIAGNOSIS — Z9989 Dependence on other enabling machines and devices: Secondary | ICD-10-CM | POA: Diagnosis not present

## 2021-02-21 DIAGNOSIS — N184 Chronic kidney disease, stage 4 (severe): Secondary | ICD-10-CM

## 2021-02-21 DIAGNOSIS — I130 Hypertensive heart and chronic kidney disease with heart failure and stage 1 through stage 4 chronic kidney disease, or unspecified chronic kidney disease: Secondary | ICD-10-CM | POA: Insufficient documentation

## 2021-02-21 DIAGNOSIS — Z794 Long term (current) use of insulin: Secondary | ICD-10-CM

## 2021-02-21 DIAGNOSIS — J45909 Unspecified asthma, uncomplicated: Secondary | ICD-10-CM | POA: Insufficient documentation

## 2021-02-21 DIAGNOSIS — Z9981 Dependence on supplemental oxygen: Secondary | ICD-10-CM | POA: Insufficient documentation

## 2021-02-21 LAB — BASIC METABOLIC PANEL
Anion gap: 8 (ref 5–15)
BUN: 46 mg/dL — ABNORMAL HIGH (ref 8–23)
CO2: 25 mmol/L (ref 22–32)
Calcium: 9 mg/dL (ref 8.9–10.3)
Chloride: 106 mmol/L (ref 98–111)
Creatinine, Ser: 3.23 mg/dL — ABNORMAL HIGH (ref 0.44–1.00)
GFR, Estimated: 16 mL/min — ABNORMAL LOW (ref >60)
Glucose, Bld: 220 mg/dL — ABNORMAL HIGH (ref 70–99)
Potassium: 3.9 mmol/L (ref 3.5–5.1)
Sodium: 139 mmol/L (ref 135–145)

## 2021-02-21 NOTE — Telephone Encounter (Signed)
BMP checked earlier today and CKD is worsening with a creatinine of 3.23 and GFR 16. One diuretic was stopped at last visit but concerned about stopping her other one due to her HTN. Patient was unsure of when she sees nephrology again.   Have routed the results to nephrology asking them to contact patient. Have also advised patient to contact nephrology next week to see if they want to see her sooner than whenever her next scheduled appointment is. Patient verbalized understanding.

## 2021-02-21 NOTE — Progress Notes (Signed)
Patient ID: Emily [name], female    DOB: December 18, 1960, 61 y.o.   MRN: [id number]   Emily [name] is a 61 y/o female with a history of asthma, DM, hyperlipidemia, HTN, stroke, thyroid disease, COPD, sleep apnea, former tobacco use and chronic heart failure.   Echo report from 04/23/20 reviewed and showed an EF of 50-55% with mild LVH. Echo report from 02/13/2018 reviewed and showed an EF of 60-65% without LVH.    Has not been admitted or been in the ED in the last 6 months.    She presents today for a follow-up visit with a chief complaint of moderate fatigue upon minimal exertion. She describes this as chronic in nature. She has associated shortness of breath along with this. She denies any difficulty sleeping, dizziness, abdominal distention, palpitations, pedal edema, chest pain, cough or weight gain.    Past Medical History:  Diagnosis Date   Arthritis    hands, all over   Asthma    CHF (congestive heart failure) (HCC)    COPD (chronic obstructive pulmonary disease) (HCC)    on 2L home o2   Diabetes mellitus without complication (HCC)    type 2   Dyspnea    Hyperlipidemia    Hypertension    Sleep apnea    CPAP   Stroke [facility])    Was told at recent hospital visit she has stroke in past   Thyroid disease    Thyroid nodule 04/30/2016   Past Surgical History:  Procedure Laterality Date   arm surgery Left    COLONOSCOPY WITH PROPOFOL N/A 02/14/2019   Procedure: COLONOSCOPY WITH PROPOFOL;  Surgeon: [name], MD;  Location: [facility] ENDOSCOPY;  Service: Endoscopy;  Laterality: N/A;   Family History  Problem Relation Age of Onset   Diabetes Mother    Hypertension Mother    Diabetes Father    Hypertension Father    CAD Father    Diabetes Sister    Social History   Tobacco Use   Smoking status: Former    Packs/day: 1.50    Years: 40.00    Pack years: 60.00    Types: Cigarettes    Quit date: 01/13/2016    Years since quitting: 5.1   Smokeless tobacco: Never  Substance Use  Topics   Alcohol use: No   No Known Allergies  Prior to Admission medications   Medication Sig Start Date End Date Taking? Authorizing Provider  ACCU-CHEK GUIDE test strip USE TO CHECK BLOOD SUGAR THREE TIMES DAILY 01/01/21  Yes Philemon Kingdom, MD  Accu-Chek Softclix Lancets lancets Use as instructed to check blood sugar 3 times a day. E11.59 09/05/19  Yes Philemon Kingdom, MD  Acetylcysteine 600 MG CAPS Take 1 capsule by mouth daily. 04/26/20  Yes [provider]  albuterol (PROVENTIL HFA;VENTOLIN HFA) 108 (90 Base) MCG/ACT inhaler Inhale 1-2 puffs into the lungs every 6 (six) hours as needed for wheezing or shortness of breath. 07/16/16  Yes Doles-Johnson, Teah, NP  albuterol (PROVENTIL) (2.5 MG/3ML) 0.083% nebulizer solution Take 3 mLs (2.5 mg total) by nebulization every 6 (six) hours as needed for wheezing or shortness of breath. 11/18/17  Yes Wieting, Richard, MD  amLODipine (NORVASC) 2.5 MG tablet Take 2 tablets by mouth daily. 07/15/20  Yes [provider]  Armodafinil 50 MG tablet Take 50 mg by mouth daily.   Yes [provider]  aspirin EC 81 MG tablet Take 81 mg by mouth daily.   Yes [provider]  atorvastatin (LIPITOR) 40  MG tablet TAKE ONE TABLET BY MOUTH EVERY DAY Patient taking differently: Take 40 mg by mouth at bedtime. 04/21/16  Yes Tawni Millers, MD  BD PEN NEEDLE MICRO U/F 32G X 6 MM MISC SMARTSIG:Pre-Filled Pen Syringe Injection 3 Times Daily 09/22/19  Yes [provider]  Blood Glucose Monitoring Suppl (ACCU-CHEK GUIDE) w/Device KIT Use as instructed to check blood sugar 3 times a day. E11.59 09/05/19  Yes Philemon Kingdom, MD  budesonide-formoterol [facility]) 80-4.5 MCG/ACT inhaler Inhale 2 puffs into the lungs 2 (two) times daily. 11/18/17  Yes Loletha Grayer, MD  carvedilol (COREG) 25 MG tablet Take 1 tablet by mouth twice daily 04/25/20  Yes Olen Eaves A, FNP  chlorthalidone (HYGROTON) 25 MG tablet Take 25 mg by mouth  daily.   Yes [provider]  cyclobenzaprine (FLEXERIL) 10 MG tablet Take 1 tablet by mouth 3 (three) times daily as needed. 08/12/20  Yes [provider]  esomeprazole (NEXIUM) 40 MG capsule Take 40 mg by mouth daily. am   Yes [provider]  fluticasone (FLONASE) 50 MCG/ACT nasal spray Place 1 spray into both nostrils daily.   Yes [provider]  insulin glargine (LANTUS SOLOSTAR) 100 UNIT/ML Solostar Pen INJECT 30 UNITS SUBCUTANEOUSLY IN THE MORNING AND 30 UNITS AT NIGHT Patient taking differently: 35 Units. INJECT 35 UNITS SUBCUTANEOUSLY IN THE MORNING AND 35 UNITS AT NIGHT 09/19/20  Yes Philemon Kingdom, MD  ipratropium (ATROVENT) 0.06 % nasal spray Place 2 sprays into both nostrils 4 (four) times daily.   Yes [provider]  methimazole (TAPAZOLE) 5 MG tablet Take 1 tablet (5 mg total) by mouth daily. 12/05/20  Yes Philemon Kingdom, MD  NOVOLOG FLEXPEN 100 UNIT/ML FlexPen Inject 18-24 units 2x a day before meals Patient taking differently: Inject 24-28 units 2x a day before meals 09/19/20  Yes Philemon Kingdom, MD  potassium chloride (MICRO-K) 10 MEQ CR capsule Take 10 mEq by mouth 2 (two) times daily.   Yes [provider]  Semaglutide, 1 MG/DOSE, (OZEMPIC, 1 MG/DOSE,) 4 MG/3ML SOPN Inject 1 mg into the skin once a week. Patient taking differently: Inject 1 mg into the skin once a week. 09/19/20  Yes Philemon Kingdom, MD  tiotropium (SPIRIVA) 18 MCG inhalation capsule Place 18 mcg into inhaler and inhale daily.   Yes [provider]  Vitamin D, Ergocalciferol, (DRISDOL) 1.25 MG (50000 UNIT) CAPS capsule Take 50,000 Units by mouth every 7 (seven) days.   Yes [provider]   Review of Systems  Constitutional:  Positive for fatigue (easily). Negative for appetite change.  HENT:  Negative for congestion, postnasal drip and sore throat.   Eyes: Negative.   Respiratory:  Positive for shortness of breath (with moderate  exertion). Negative for cough and chest tightness.   Cardiovascular:  Negative for chest pain, palpitations and leg swelling.  Gastrointestinal:  Negative for abdominal distention and abdominal pain.  Endocrine: Negative.   Genitourinary: Negative.   Musculoskeletal:  Negative for back pain and neck pain.  Skin: Negative.   Allergic/Immunologic: Negative.   Neurological:  Negative for dizziness and light-headedness.  Hematological:  Negative for adenopathy. Does not bruise/bleed easily.  Psychiatric/Behavioral:  Negative for dysphoric mood and sleep disturbance (sleeping on 1 pillow). The patient is not nervous/anxious.    Vitals:   02/21/21 0954  BP: (!) 162/88  Pulse: 95  Resp: 18  SpO2: 92%  Weight: 221 lb 1 oz (100.3 kg)  Height: '5\' 4"'  (1.626 m)   Wt Readings  from Last 3 Encounters:  02/21/21 221 lb 1 oz (100.3 kg)  01/23/21 221 lb 3.2 oz (100.3 kg)  01/21/21 217 lb 2 oz (98.5 kg)   Lab Results  Component Value Date   CREATININE 2.65 (H) 04/25/2019   CREATININE 1.87 (H) 04/05/2018   CREATININE 2.46 (H) 02/18/2018   Physical Exam Vitals and nursing note reviewed.  Constitutional:      General: She is not in acute distress.    Appearance: Normal appearance. She is not ill-appearing.  HENT:     Head: Normocephalic and atraumatic.  Cardiovascular:     Rate and Rhythm: Normal rate and regular rhythm.     Heart sounds: Normal heart sounds.  Pulmonary:     Effort: Pulmonary effort is normal. No respiratory distress.     Breath sounds: Normal breath sounds. No wheezing or rales.  Abdominal:     General: There is no distension.     Palpations: Abdomen is soft.     Tenderness: There is no abdominal tenderness.  Musculoskeletal:        General: No tenderness.     Cervical back: Normal range of motion and neck supple.     Left knee: Swelling (medial left knee) present.     Right lower leg: Edema (trace) present.     Left lower leg: Edema (trace) present.  Skin:     General: Skin is warm and dry.  Neurological:     General: No focal deficit present.     Mental Status: She is alert and oriented to person, place, and time.  Psychiatric:        Mood and Affect: Mood normal.        Behavior: Behavior normal.        Thought Content: Thought content normal.   Assessment & Plan:  1: Chronic heart failure with preserved ejection fraction with structural changes (LVH)-  - NYHA class III - euvolemic today - weighing daily; encouraged to resume daily weighing and reminded to call for an overnight weight gain of >2 pounds or a weekly weight gain of >5 pounds - weight up 4 pounds from last visit here 1 month ago - not adding salt and has been using Mrs. Dash for seasoning. - wearing compression socks daily with removal at bedtime along with compression pumps that she uses "sometimes" - will check BMP today and see if renal function has improved since one of the diuretics was stopped at last visit; if so consider using entresto or ARB - saw vascular ([place]) 12/16/20 - BNP 04/25/19 was 275.0  2: HTN- - BP elevated (162/88); slightly better upon recheck (154/77); will consider adding ARB after lab results obtained.  - sees PCP [place] from 10/04/20 reviewed and showed sodium 142, potassium 3.6, creatinine 2.74 and GFR 19  3: COPD- - saw pulmonology Lanney Gins) 08/15/20 - wearing oxygen at 4L around the clock at home; did not bring with her today  4: Obstructive sleep apnea- - wearing CPAP nightly - continues nuvigil and she says that she feels better since resuming it although still gets quite fatigued, just not as sleepy  5: DM with CKD- - fasting glucose at home today was 226 (ate hamburger last night) - saw endocrinologist Renne Crigler) 01/23/21 - A1c 09/19/20 was 6.8% - saw nephrology ([place]) 10/09/20   Medication bottles reviewed.   Return in 1 month, sooner if needed.

## 2021-02-21 NOTE — Patient Instructions (Signed)
Continue weighing daily and call for an overnight weight gain of 3 pounds or more or a weekly weight gain of more than 5 pounds.  °

## 2021-03-05 ENCOUNTER — Other Ambulatory Visit: Payer: Self-pay | Admitting: Internal Medicine

## 2021-03-18 ENCOUNTER — Ambulatory Visit: Payer: Medicaid Other | Admitting: Family

## 2021-03-26 NOTE — Progress Notes (Signed)
Patient ID: Emily [name], female    DOB: Feb 11, 1960, 61 y.o.   MRN: [id number] ? ? ?Emily [name] is a 61 y/o female with a history of asthma, DM, hyperlipidemia, HTN, stroke, thyroid disease, COPD, sleep apnea, former tobacco use and chronic heart failure.  ? ?Echo report from 04/23/20 reviewed and showed an EF of 50-55% with mild LVH. Echo report from 02/13/2018 reviewed and showed an EF of 60-65% without LVH.   ? ?Has not been admitted or been in the ED in the last 6 months.   ? ?She presents today for a follow-up visit with a chief complaint of moderate fatigue with minimal exertion. She describes this as chronic in nature having been present for years. She has associated shortness  of  breath and minimal pedal edema along with this. She denies any cough, chest pain, palpitations, abdominal distention, dizziness, difficulty sleeping or weight gain (although she doesn't weigh daily).  ? ?Says that she gets a lot of exercise by walking around [employer] often. Does lean on the buggy to assist so she doesn't get quite as tired.  ? ?Has worn compression socks in the past but not recently.  ? ?Past Medical History:  ?Diagnosis Date  ? Arthritis   ? hands, all over  ? Asthma   ? CHF (congestive heart failure) ([place])   ? COPD (chronic obstructive pulmonary disease) ([place])   ? on 2L home o2  ? Diabetes mellitus without complication ([place])   ? type 2  ? Dyspnea   ? Hyperlipidemia   ? Hypertension   ? Sleep apnea   ? CPAP  ? Stroke [facility])   ? Was told at recent hospital visit she has stroke in past  ? Thyroid disease   ? Thyroid nodule 04/30/2016  ? ?Past Surgical History:  ?Procedure Laterality Date  ? arm surgery Left   ? COLONOSCOPY WITH PROPOFOL N/A 02/14/2019  ? Procedure: COLONOSCOPY WITH PROPOFOL;  Surgeon: [name], MD;  Location: [facility] ENDOSCOPY;  Service: Endoscopy;  Laterality: N/A;  ? ?Family History  ?Problem Relation Age of Onset  ? Diabetes Mother   ? Hypertension Mother   ? Diabetes Father   ? Hypertension Father    ? CAD Father   ? Diabetes Sister   ? ?Social History  ? ?Tobacco Use  ? Smoking status: Former  ?  Packs/day: 1.50  ?  Years: 40.00  ?  Pack years: 60.00  ?  Types: Cigarettes  ?  Quit date: 01/13/2016  ?  Years since quitting: 5.2  ? Smokeless tobacco: Never  ?Substance Use Topics  ? Alcohol use: No  ? ?No Known Allergies ? ?Prior to Admission medications   ?Medication Sig Start Date End Date Taking? Authorizing Provider  ?ACCU-CHEK GUIDE test strip USE TO CHECK BLOOD SUGAR THREE TIMES DAILY 01/01/21  Yes Philemon Kingdom, MD  ?Accu-Chek Softclix Lancets lancets USE TO CHECK BLOOD SUGAR THREE TIMES DAILY AS INSTRUCTED 03/05/21  Yes Philemon Kingdom, MD  ?Acetylcysteine 600 MG CAPS Take 1 capsule by mouth daily. 04/26/20  Yes [provider]  ?albuterol (PROVENTIL HFA;VENTOLIN HFA) 108 (90 Base) MCG/ACT inhaler Inhale 1-2 puffs into the lungs every 6 (six) hours as needed for wheezing or shortness of breath. 07/16/16  Yes Doles-Johnson, Teah, NP  ?amLODipine (NORVASC) 5 MG tablet Take 1 tablet by mouth daily. 07/15/20  Yes [provider]  ?Armodafinil 50 MG tablet Take 50 mg by mouth daily.   Yes [provider]  ?aspirin EC 81  MG tablet Take 81 mg by mouth daily.   Yes [provider]  ?atorvastatin (LIPITOR) 40 MG tablet TAKE ONE TABLET BY MOUTH EVERY DAY ?Patient taking differently: Take 40 mg by mouth at bedtime. 04/21/16  Yes Tawni Millers, MD  ?BD PEN NEEDLE MICRO U/F 32G X 6 MM MISC SMARTSIG:Pre-Filled Pen Syringe Injection 3 Times Daily 09/22/19  Yes [provider]  ?Blood Glucose Monitoring Suppl (ACCU-CHEK GUIDE) w/Device KIT Use as instructed to check blood sugar 3 times a day. E11.59 09/05/19  Yes Philemon Kingdom, MD  ?budesonide-formoterol [facility]) 80-4.5 MCG/ACT inhaler Inhale 2 puffs into the lungs 2 (two) times daily. 11/18/17  Yes Loletha Grayer, MD  ?carvedilol (COREG) 25 MG tablet Take 1 tablet by mouth twice daily 04/25/20  Yes Alisa Graff, FNP   ?chlorthalidone (HYGROTON) 25 MG tablet Take 25 mg by mouth daily.   Yes [provider]  ?cyclobenzaprine (FLEXERIL) 10 MG tablet Take 1 tablet by mouth 3 (three) times daily as needed. 08/12/20  Yes [provider]  ?esomeprazole (NEXIUM) 40 MG capsule Take 40 mg by mouth daily. am   Yes [provider]  ?fluticasone (FLONASE) 50 MCG/ACT nasal spray Place 1 spray into both nostrils daily.   Yes [provider]  ?insulin glargine (LANTUS SOLOSTAR) 100 UNIT/ML Solostar Pen INJECT 30 UNITS SUBCUTANEOUSLY IN THE MORNING AND 30 UNITS AT NIGHT ?Patient taking differently: 35 Units. INJECT 35 UNITS SUBCUTANEOUSLY IN THE MORNING AND 35 UNITS AT NIGHT 09/19/20  Yes Philemon Kingdom, MD  ?ipratropium (ATROVENT) 0.06 % nasal spray Place 2 sprays into both nostrils 4 (four) times daily.   Yes [provider]  ?methimazole (TAPAZOLE) 5 MG tablet Take 1 tablet (5 mg total) by mouth daily. 12/05/20  Yes Philemon Kingdom, MD  ?[employer]ant FLEXPEN 100 UNIT/ML FlexPen Inject 18-24 units 2x a day before meals ?Patient taking differently: Inject 24-28 units 2x a day before meals 09/19/20  Yes Philemon Kingdom, MD  ?potassium chloride (MICRO-K) 10 MEQ CR capsule Take 10 mEq by mouth 2 (two) times daily.   Yes [provider]  ?Semaglutide, 1 MG/DOSE, (OZEMPIC, 1 MG/DOSE,) 4 MG/3ML SOPN Inject 1 mg into the skin once a week. ?Patient taking differently: Inject 1 mg into the skin once a week. 09/19/20  Yes Philemon Kingdom, MD  ?tiotropium (SPIRIVA) 18 MCG inhalation capsule Place 18 mcg into inhaler and inhale daily.   Yes [provider]  ?Vitamin D, Ergocalciferol, (DRISDOL) 1.25 MG (50000 UNIT) CAPS capsule Take 50,000 Units by mouth every 7 (seven) days.   Yes [provider]  ?albuterol (PROVENTIL) (2.5 MG/3ML) 0.083% nebulizer solution Take 3 mLs (2.5 mg total) by nebulization every 6 (six) hours as needed for wheezing or shortness of breath. ?Patient not taking:  Reported on 03/27/2021 11/18/17   Loletha Grayer, MD  ? ? ?Review of Systems  ?Constitutional:  Positive for fatigue (easily). Negative for appetite change.  ?HENT:  Negative for congestion, postnasal drip and sore throat.   ?Eyes: Negative.   ?Respiratory:  Positive for shortness of breath (with moderate exertion). Negative for cough and chest tightness.   ?Cardiovascular:  Positive for leg swelling (both feet). Negative for chest pain and palpitations.  ?Gastrointestinal:  Negative for abdominal distention and abdominal pain.  ?Endocrine: Negative.   ?Genitourinary: Negative.   ?Musculoskeletal:  Negative for back pain and neck pain.  ?Skin: Negative.   ?Allergic/Immunologic: Negative.   ?Neurological:  Negative for dizziness and light-headedness.  ?Hematological:  Negative for  adenopathy. Does not bruise/bleed easily.  ?Psychiatric/Behavioral:  Negative for dysphoric mood and sleep disturbance (sleeping on 2 pillows; using CPAP). The patient is not nervous/anxious.   ? ?Vitals:  ? 03/27/21 0845  ?BP: 135/83  ?Pulse: 96  ?Resp: 18  ?SpO2: 97%  ?Weight: 218 lb 8 oz (99.1 kg)  ?Height: '5\' 4"'  (1.626 m)  ? ?Wt Readings from Last 3 Encounters:  ?03/27/21 218 lb 8 oz (99.1 kg)  ?02/21/21 221 lb 1 oz (100.3 kg)  ?01/23/21 221 lb 3.2 oz (100.3 kg)  ? ?Lab Results  ?Component Value Date  ? CREATININE 3.23 (H) 02/21/2021  ? CREATININE 2.65 (H) 04/25/2019  ? CREATININE 1.87 (H) 04/05/2018  ? ?Physical Exam ?Vitals and nursing note reviewed.  ?Constitutional:   ?   General: She is not in acute distress. ?   Appearance: Normal appearance. She is not ill-appearing.  ?HENT:  ?   Head: Normocephalic and atraumatic.  ?Cardiovascular:  ?   Rate and Rhythm: Normal rate and regular rhythm.  ?   Heart sounds: Normal heart sounds.  ?Pulmonary:  ?   Effort: Pulmonary effort is normal. No respiratory distress.  ?   Breath sounds: Normal breath sounds. No wheezing or rales.  ?Abdominal:  ?   General: There is no distension.  ?    Palpations: Abdomen is soft.  ?   Tenderness: There is no abdominal tenderness.  ?Musculoskeletal:     ?   General: No tenderness.  ?   Cervical back: Normal range of motion and neck supple.  ?   Right lower

## 2021-03-27 ENCOUNTER — Other Ambulatory Visit: Payer: Self-pay

## 2021-03-27 ENCOUNTER — Encounter: Payer: Self-pay | Admitting: Family

## 2021-03-27 ENCOUNTER — Ambulatory Visit: Payer: Medicaid Other | Attending: Family | Admitting: Family

## 2021-03-27 VITALS — BP 135/83 | HR 96 | Resp 18 | Ht 64.0 in | Wt 218.5 lb

## 2021-03-27 DIAGNOSIS — Z8673 Personal history of transient ischemic attack (TIA), and cerebral infarction without residual deficits: Secondary | ICD-10-CM | POA: Insufficient documentation

## 2021-03-27 DIAGNOSIS — N189 Chronic kidney disease, unspecified: Secondary | ICD-10-CM | POA: Insufficient documentation

## 2021-03-27 DIAGNOSIS — Z794 Long term (current) use of insulin: Secondary | ICD-10-CM

## 2021-03-27 DIAGNOSIS — E785 Hyperlipidemia, unspecified: Secondary | ICD-10-CM | POA: Insufficient documentation

## 2021-03-27 DIAGNOSIS — I5032 Chronic diastolic (congestive) heart failure: Secondary | ICD-10-CM

## 2021-03-27 DIAGNOSIS — I10 Essential (primary) hypertension: Secondary | ICD-10-CM | POA: Diagnosis not present

## 2021-03-27 DIAGNOSIS — E1122 Type 2 diabetes mellitus with diabetic chronic kidney disease: Secondary | ICD-10-CM | POA: Diagnosis not present

## 2021-03-27 DIAGNOSIS — G4730 Sleep apnea, unspecified: Secondary | ICD-10-CM | POA: Insufficient documentation

## 2021-03-27 DIAGNOSIS — J45909 Unspecified asthma, uncomplicated: Secondary | ICD-10-CM | POA: Insufficient documentation

## 2021-03-27 DIAGNOSIS — E079 Disorder of thyroid, unspecified: Secondary | ICD-10-CM | POA: Diagnosis not present

## 2021-03-27 DIAGNOSIS — J449 Chronic obstructive pulmonary disease, unspecified: Secondary | ICD-10-CM

## 2021-03-27 DIAGNOSIS — I130 Hypertensive heart and chronic kidney disease with heart failure and stage 1 through stage 4 chronic kidney disease, or unspecified chronic kidney disease: Secondary | ICD-10-CM | POA: Diagnosis not present

## 2021-03-27 DIAGNOSIS — G4733 Obstructive sleep apnea (adult) (pediatric): Secondary | ICD-10-CM

## 2021-03-27 DIAGNOSIS — Z87891 Personal history of nicotine dependence: Secondary | ICD-10-CM | POA: Insufficient documentation

## 2021-03-27 DIAGNOSIS — N184 Chronic kidney disease, stage 4 (severe): Secondary | ICD-10-CM

## 2021-03-27 NOTE — Patient Instructions (Addendum)
Begin weighing daily and call for an overnight weight gain of 3 pounds or more or a weekly weight gain of more than 5 pounds. ? ? ?If you have voicemail, please make sure your mailbox is cleaned out so that we may leave a message and please make sure to listen to any voicemails. ? ? ? ?Get compression socks and put them on every morning with removal at bedtime  ? ? ?

## 2021-04-15 ENCOUNTER — Other Ambulatory Visit: Payer: Self-pay | Admitting: Family

## 2021-04-17 ENCOUNTER — Ambulatory Visit: Payer: Medicaid Other | Admitting: Podiatry

## 2021-04-17 ENCOUNTER — Encounter: Payer: Self-pay | Admitting: Podiatry

## 2021-04-17 DIAGNOSIS — B351 Tinea unguium: Secondary | ICD-10-CM

## 2021-04-17 DIAGNOSIS — M79675 Pain in left toe(s): Secondary | ICD-10-CM

## 2021-04-17 DIAGNOSIS — E1159 Type 2 diabetes mellitus with other circulatory complications: Secondary | ICD-10-CM

## 2021-04-17 DIAGNOSIS — I872 Venous insufficiency (chronic) (peripheral): Secondary | ICD-10-CM | POA: Diagnosis not present

## 2021-04-17 DIAGNOSIS — M79674 Pain in right toe(s): Secondary | ICD-10-CM | POA: Diagnosis not present

## 2021-04-17 DIAGNOSIS — N189 Chronic kidney disease, unspecified: Secondary | ICD-10-CM | POA: Diagnosis not present

## 2021-04-17 NOTE — Progress Notes (Signed)
Complaint:  Visit Type: Patient returns to my office for continued preventative foot care services. Complaint: Patient states" my nails have grown long and thick and become painful to walk and wear shoes" Patient has been diagnosed with DM with vascular disease and chronic venous insufficiency and chronic kidney disease.. The patient presents for preventative foot care services.  Podiatric Exam: Vascular: dorsalis pedis and posterior tibial pulses are weakly  palpable bilateral. Capillary return is immediate. Temperature gradient is WNL. Skin turgor WNL  Sensorium: Normal Semmes Weinstein monofilament test. Normal tactile sensation bilaterally. Nail Exam: Pt has thick disfigured discolored nails with subungual debris noted bilateral entire nail hallux through fifth toenails Ulcer Exam: There is no evidence of ulcer or pre-ulcerative changes or infection. Orthopedic Exam: Muscle tone and strength are WNL. No limitations in general ROM. No crepitus or effusions noted. Foot type and digits show no abnormalities. Bony prominences are unremarkable. Skin: No Porokeratosis. No infection or ulcers  Diagnosis:  Onychomycosis, , Pain in right toe, pain in left toes  Treatment & Plan Procedures and Treatment: Consent by patient was obtained for treatment procedures.   Debridement of mycotic and hypertrophic toenails, 1 through 5 bilateral and clearing of subungual debris. No ulceration, no infection noted.  Return Visit-Office Procedure: Patient instructed to return to the office for a follow up visit 3 months for continued evaluation and treatment. RTC 3 months    Marquice Uddin DPM 

## 2021-04-24 ENCOUNTER — Ambulatory Visit: Payer: Medicaid Other | Admitting: Internal Medicine

## 2021-05-20 ENCOUNTER — Encounter: Payer: Self-pay | Admitting: Internal Medicine

## 2021-05-20 ENCOUNTER — Ambulatory Visit (INDEPENDENT_AMBULATORY_CARE_PROVIDER_SITE_OTHER): Payer: Medicaid Other | Admitting: Internal Medicine

## 2021-05-20 VITALS — BP 138/84 | HR 90 | Ht 64.0 in | Wt 219.0 lb

## 2021-05-20 DIAGNOSIS — E1165 Type 2 diabetes mellitus with hyperglycemia: Secondary | ICD-10-CM

## 2021-05-20 DIAGNOSIS — E1159 Type 2 diabetes mellitus with other circulatory complications: Secondary | ICD-10-CM

## 2021-05-20 DIAGNOSIS — E041 Nontoxic single thyroid nodule: Secondary | ICD-10-CM | POA: Diagnosis not present

## 2021-05-20 DIAGNOSIS — E785 Hyperlipidemia, unspecified: Secondary | ICD-10-CM

## 2021-05-20 DIAGNOSIS — E0590 Thyrotoxicosis, unspecified without thyrotoxic crisis or storm: Secondary | ICD-10-CM | POA: Diagnosis not present

## 2021-05-20 LAB — POCT GLYCOSYLATED HEMOGLOBIN (HGB A1C): Hemoglobin A1C: 7.2 % — AB (ref 4.0–5.6)

## 2021-05-20 NOTE — Progress Notes (Signed)
Patient ID: Emily [name], female   DOB: 10/20/1960, 61 y.o.   MRN: [id number] ? ?This visit occurred during the SARS-CoV-2 public health emergency.  Safety protocols were in place, including screening questions prior to the visit, additional usage of staff PPE, and extensive cleaning of exam room while observing appropriate contact time as indicated for disinfecting solutions.  ? ?HPI  ?Emily [name] is a 61 y.o.-year-old female, returning for f/u for thyrotoxicosis/goiter and DM2, uncontrolled, insulin-dependent, with complications (CKD stage 3, cerebro-vascular ds. - s/p stroke). Last visit 6 months ago. ? ?Interim history: ?No increased urination, blurry vision, nausea, chest pain. ?Also, no neck compression symptoms. ?At last visit I strongly advised her to stop sodas.  She is drinking them occasionally. ? ?TMNG/Graves ds.: ?Reviewed and addended history: ?Pt was dx'ed with hyperthyroidism in ~2014 >> started MMI, lately 10 mg 2x daily. She was lost for f/u at [facility] as she could not travel over there. ? ?Thyroid uptake and scan (06/22/2013) showed: ?FINDINGS: The 24 hour uptake was 24.7%. The normal reference range at 24 hours is 15 to 35%.  ? ?The thyroid scan demonstrates symmetric, mildly heterogenous distribution of radiopharmaceutical throughout the thyroid, which is increased in size. ? ?IMPRESSION: ?Mildly heterogenous iodine uptake, cannot exclude toxic multinodular goiter versus possible mild Graves disease. Consider ultrasound for further correlation. ? ?Patient was on methimazole before, however, this was stopped after  TSH returned very elevated, in the 90s (02/2015) >> TSH decreased to 8, then normalized >> we continued her off MMI.  ? ?However,she was started on MMI 10 mg 2x a day (re-Rx by Dr. Rosanna Randy: 04/17/2016, [facility]) - I do not think he was aware that we had stopped MMI. ? ?We stopped methimazole again in 05/2016. ? ?However, we had to restart again methimazole in 05/2017.   She was on 5 mg daily, but this was stopped during the hospitalization for COPD and CHF exacerbation 02/2018. ? ?In 12/2018 we had to restart methimazole 5 mg daily. ? ?In 09/2019, we called her to decrease the dose to 2.5 mg daily, but she did not get the msg >> still on 5 mg daily. ? ?In 12/2019, we decreased the methimazole dose to 2.5 mg daily. ? ?In 09/2020, we stopped methimazole. ? ?In 12/2020, we restarted methimazole 5 mg daily. ? ?Reviewed her TFTs: ?Lab Results  ?Component Value Date  ? TSH 0.56 01/09/2021  ? TSH 0.05 (L) 12/05/2020  ? TSH 0.21 (L) 10/29/2020  ? TSH 1.44 09/19/2020  ? TSH 2.12 03/12/2020  ? TSH 0.49 02/02/2020  ? TSH 3.80 12/12/2019  ? TSH 4.37 10/03/2019  ? TSH 2.99 06/13/2019  ? TSH 2.09 03/06/2019  ? FREET4 0.63 01/09/2021  ? FREET4 0.83 12/05/2020  ? FREET4 0.82 10/29/2020  ? FREET4 0.59 (L) 09/19/2020  ? FREET4 0.61 03/12/2020  ? FREET4 0.80 02/02/2020  ? FREET4 0.70 12/12/2019  ? FREET4 0.57 (L) 10/03/2019  ? FREET4 0.66 06/13/2019  ? FREET4 0.75 03/06/2019  ? ?Previously: ? ? ?Thyroid U/S (05/12/2016): Large isthmic nodule ?Thyroid tissue is diffusely heterogeneous and enlarged. ? ?The isthmus has a large isoechoic nodular structure. Unclear if this ?represents a discrete nodule versus focal heterogeneous thyroid ?tissue. This area measures up to 3.3 cm and recommend biopsy of this ?area. ? ?Numerous small nodules and cysts throughout the left and right ?thyroid lobes. These predominantly cystic nodules do not meet ?criteria for biopsy or dedicated follow-up. ? ?FNA of the isthmic nodule (06/25/2016): Benign ? ?Neck U/S (  05/07/2020): ?Parenchymal Echotexture: Moderately heterogenous ?Isthmus: 0.9 cm thickness, previously 1.2 ?Right lobe: 9.9 x 3.9 x 3.4 cm, previously 8.4 x 2.8 x 4.5 ?Left lobe: 8.6 x 3 x 3.3 cm, previously 8.2 x 3 x 3.7 ?_________________________________________________________ ? ?Nodule # 1: 3.6 x 3.4 x 1.7 cm isthmic nodule, previously 3.3 x 2.7 x 1.7; this was  previously biopsied. ? ?Nodule # 2: ?Prior biopsy: No ?Location: Right; superior ?Maximum size: 1.2 cm; Other 2 dimensions: 0.8 x 0.7 cm, previously, 1 x 0.9 x 0.7 cm ?Composition: solid/almost completely solid (2) ?Echogenicity: hypoechoic (2) ?*Given size (>/= 1 - 1.4 cm) and appearance, a follow-up ultrasound ?in 1 year should be considered based on TI-RADS criteria. ?_________________________________________________________ ? ?Nodule # 3: ?Prior biopsy: No ?Location: Right; superior posterior ?Maximum size: 1.6 cm; Other 2 dimensions: 1.4 x 1 cm, previously, 1.6 x 1.3 x 1.3 cm ?Composition: mixed cystic and solid (1) ?Echogenicity: hypoechoic (2) ?*Given size (>/= 1.5 - 2.4 cm) and appearance, a follow-up ?ultrasound in 1 year should be considered based on TI-RADS criteria. ?_________________________________________________________ ? ?Nodule # 4: 1.3 cm benign colloid cyst, mid right ? ?Nodule # 5: 1.1 cm benign colloid cyst, inferior medial right ? ?Nodule # 6: 1.1 cm benign colloid cyst, inferior  right ? ?Nodule # 7: ?Prior biopsy: No ?Location: Left; mid ?Maximum size: 2.2 cm; Other 2 dimensions: 1.4 x 0.8 cm, previously, 0.9 x 0.8 x 0.7 cm ?Composition: mixed cystic and solid (1) ?Echogenicity: hypoechoic (2) ?Significant change in size (>/= 20% in two dimensions and minimal increase of 2 mm): Yes ?*Given size (>/= 1.5 - 2.4 cm) and appearance, a follow-up ?ultrasound in 1 year should be considered based on TI-RADS criteria. ?_________________________________________________________ ? ?Nodule # 8: ?Prior biopsy: No ?Location: Left; inferior ?Maximum size: 1.3 cm; Other 2 dimensions: 1.1 x 0.5 cm, previously, 1.3 x 1 x 0.7 cm ?Composition: spongiform (0) ?Echogenicity: hypoechoic (2) ?This nodule does NOT meet TI-RADS criteria for biopsy or dedicated follow-up. ?________________________________________________________ ? ?Nodule # 9: 1.3 cm spongiform nodule, inferior left, previously 2 ?cm; This nodule does  NOT meet TI-RADS criteria for biopsy or dedicated follow-up. ? ?Nodule # 10: 1.1 cm complex cyst without calcifications, inferior ?left; This nodule does NOT meet TI-RADS criteria for biopsy or dedicated follow-up. ? ?IMPRESSION: ?1. Thyromegaly with bilateral nodules. None currently meets criteria for biopsy. ?2. Recommend annual/biennial ultrasound follow-up of nodules as above, until stability x5 years confirmed. ? ?Pt denies: ?- feeling nodules in neck ?- hoarseness ?- dysphagia ?- choking ?- SOB with lying down ? ?Pt does have a FH of thyroid ds.: aunt. No FH of thyroid cancer. No h/o radiation tx to head or neck. ? ?No herbal supplements. No Biotin use. No recent steroids use.  ? ?DM2: ?She has a history of noncompliance with medications. ?She had several complications: History of stroke, also CKD, DR. ? ?Reviewed HbA1c levels: ?Lab Results  ?Component Value Date  ? HGBA1C 10.5 (A) 01/23/2021  ? HGBA1C 6.8 (A) 09/19/2020  ? HGBA1C 8.2 (A) 03/12/2020  ?10/03/2019: HbA1c 7.4% but HbA1c calculated from fructosamine is 5.4%. ? ?She was on: ?- Lantus 45 units 2x a day >> was off insulin - "the pharmacy did not receive it" ?- Januvia 50 mg in am >> stopped (?) ?- Glipizide 10 mg 2x a day >> started in the hospital ?We added Jardiance at last visit >> not taking  - "the pharmacy did not receive it" ?She was on Metformin >> diarrhea. ? ?Now on: ?-  Ozempic 0.5 mg weekly restarted 07/2019 >> 1 mg weekly  ?- Lantus 50 >> 30 units twice a day >> 35 units twice a day ?- NovoLog 16 units before breakfast and  20 units before dinner >> 18-20 units before breakfast and 22-24 units before dinner >>  20-24 units before breakfast and 24-28 units before dinner  ?We had to start Jardiance 10 mg 02/2018 due to decreased kidney function. ?Previously on Trulicity. ? ?She is checking sugars twice a day: ?- am:  82-181 >> 77, 141-273 >> 91-179, 201, HI >> 121, 165-HI >> 66, 74-175, HI ?- 2h after b'fast: n/c ?- lunch: n/c ?- 2h after  lunch: n/c >> 178 >> 121 >> 128 >> n/c ?- dinner: 110-146 >> n/c >> 83-136 >> 165 >> n/c>> 124-161 ?- 2h after dinner:  95-137, 226 >> 220-257 >> 111-140 >> 144 >> 95-166 ?- bedtime:  156-265 >> 108-149, 19

## 2021-05-20 NOTE — Patient Instructions (Addendum)
STOP SODAS! ? ?Please continue: ?- Lantus 35 units in a.m. and 35 units at bedtime ?- NovoLog 20-24 units before breakfast and 24-28 units before dinner  ?- Ozempic 1 mg weekly ? ?Continue methimazole 5 mg daily. ? ?Please return in 3-4 months with your sugar log.  ?

## 2021-06-14 ENCOUNTER — Other Ambulatory Visit: Payer: Self-pay

## 2021-06-14 ENCOUNTER — Emergency Department: Payer: Medicaid Other

## 2021-06-14 ENCOUNTER — Encounter: Payer: Self-pay | Admitting: Emergency Medicine

## 2021-06-14 ENCOUNTER — Inpatient Hospital Stay
Admission: EM | Admit: 2021-06-14 | Discharge: 2021-06-18 | DRG: 637 | Disposition: A | Discharge status: Home / Self Care | Payer: Medicaid Other | Attending: Internal Medicine | Admitting: Internal Medicine

## 2021-06-14 DIAGNOSIS — Z87891 Personal history of nicotine dependence: Secondary | ICD-10-CM | POA: Diagnosis not present

## 2021-06-14 DIAGNOSIS — J9622 Acute and chronic respiratory failure with hypercapnia: Secondary | ICD-10-CM | POA: Diagnosis present

## 2021-06-14 DIAGNOSIS — I130 Hypertensive heart and chronic kidney disease with heart failure and stage 1 through stage 4 chronic kidney disease, or unspecified chronic kidney disease: Secondary | ICD-10-CM | POA: Diagnosis present

## 2021-06-14 DIAGNOSIS — N179 Acute kidney failure, unspecified: Secondary | ICD-10-CM | POA: Diagnosis present

## 2021-06-14 DIAGNOSIS — E1122 Type 2 diabetes mellitus with diabetic chronic kidney disease: Secondary | ICD-10-CM | POA: Diagnosis present

## 2021-06-14 DIAGNOSIS — T68XXXA Hypothermia, initial encounter: Secondary | ICD-10-CM | POA: Diagnosis present

## 2021-06-14 DIAGNOSIS — E0590 Thyrotoxicosis, unspecified without thyrotoxic crisis or storm: Secondary | ICD-10-CM | POA: Diagnosis present

## 2021-06-14 DIAGNOSIS — N2581 Secondary hyperparathyroidism of renal origin: Secondary | ICD-10-CM | POA: Diagnosis present

## 2021-06-14 DIAGNOSIS — E876 Hypokalemia: Secondary | ICD-10-CM | POA: Diagnosis not present

## 2021-06-14 DIAGNOSIS — E11649 Type 2 diabetes mellitus with hypoglycemia without coma: Principal | ICD-10-CM | POA: Diagnosis present

## 2021-06-14 DIAGNOSIS — E1165 Type 2 diabetes mellitus with hyperglycemia: Secondary | ICD-10-CM | POA: Diagnosis not present

## 2021-06-14 DIAGNOSIS — D631 Anemia in chronic kidney disease: Secondary | ICD-10-CM | POA: Diagnosis present

## 2021-06-14 DIAGNOSIS — Z833 Family history of diabetes mellitus: Secondary | ICD-10-CM

## 2021-06-14 DIAGNOSIS — Z20822 Contact with and (suspected) exposure to covid-19: Secondary | ICD-10-CM | POA: Diagnosis present

## 2021-06-14 DIAGNOSIS — Z794 Long term (current) use of insulin: Secondary | ICD-10-CM

## 2021-06-14 DIAGNOSIS — Z7951 Long term (current) use of inhaled steroids: Secondary | ICD-10-CM

## 2021-06-14 DIAGNOSIS — N184 Chronic kidney disease, stage 4 (severe): Secondary | ICD-10-CM | POA: Diagnosis present

## 2021-06-14 DIAGNOSIS — I5032 Chronic diastolic (congestive) heart failure: Secondary | ICD-10-CM | POA: Diagnosis present

## 2021-06-14 DIAGNOSIS — G9341 Metabolic encephalopathy: Secondary | ICD-10-CM | POA: Diagnosis present

## 2021-06-14 DIAGNOSIS — J441 Chronic obstructive pulmonary disease with (acute) exacerbation: Secondary | ICD-10-CM | POA: Diagnosis present

## 2021-06-14 DIAGNOSIS — R680 Hypothermia, not associated with low environmental temperature: Secondary | ICD-10-CM | POA: Diagnosis present

## 2021-06-14 DIAGNOSIS — E1151 Type 2 diabetes mellitus with diabetic peripheral angiopathy without gangrene: Secondary | ICD-10-CM | POA: Diagnosis present

## 2021-06-14 DIAGNOSIS — Z79899 Other long term (current) drug therapy: Secondary | ICD-10-CM

## 2021-06-14 DIAGNOSIS — E785 Hyperlipidemia, unspecified: Secondary | ICD-10-CM | POA: Diagnosis present

## 2021-06-14 DIAGNOSIS — Z8673 Personal history of transient ischemic attack (TIA), and cerebral infarction without residual deficits: Secondary | ICD-10-CM

## 2021-06-14 DIAGNOSIS — G4730 Sleep apnea, unspecified: Secondary | ICD-10-CM | POA: Diagnosis present

## 2021-06-14 DIAGNOSIS — J9621 Acute and chronic respiratory failure with hypoxia: Secondary | ICD-10-CM | POA: Diagnosis present

## 2021-06-14 DIAGNOSIS — G4733 Obstructive sleep apnea (adult) (pediatric): Secondary | ICD-10-CM | POA: Diagnosis present

## 2021-06-14 DIAGNOSIS — E162 Hypoglycemia, unspecified: Secondary | ICD-10-CM | POA: Diagnosis present

## 2021-06-14 DIAGNOSIS — Z841 Family history of disorders of kidney and ureter: Secondary | ICD-10-CM

## 2021-06-14 DIAGNOSIS — I10 Essential (primary) hypertension: Secondary | ICD-10-CM | POA: Diagnosis present

## 2021-06-14 DIAGNOSIS — Z8249 Family history of ischemic heart disease and other diseases of the circulatory system: Secondary | ICD-10-CM

## 2021-06-14 DIAGNOSIS — Z6837 Body mass index (BMI) 37.0-37.9, adult: Secondary | ICD-10-CM

## 2021-06-14 DIAGNOSIS — J449 Chronic obstructive pulmonary disease, unspecified: Secondary | ICD-10-CM | POA: Diagnosis present

## 2021-06-14 DIAGNOSIS — E108 Type 1 diabetes mellitus with unspecified complications: Secondary | ICD-10-CM

## 2021-06-14 LAB — BLOOD GAS, VENOUS
Acid-base deficit: 5.1 mmol/L — ABNORMAL HIGH (ref 0.0–2.0)
Acid-base deficit: 2.4 mmol/L — ABNORMAL HIGH (ref 0.0–2.0)
Bicarbonate: 25 mmol/L (ref 20.0–28.0)
Bicarbonate: 25.9 mmol/L (ref 20.0–28.0)
O2 Saturation: 67.3 %
O2 Saturation: 67.8 %
Patient temperature: 37
Patient temperature: 37
pCO2, Ven: 70 mmHg — ABNORMAL HIGH (ref 44–60)
pCO2, Ven: 59 mmHg (ref 44–60)
pH, Ven: 7.16 — CL (ref 7.25–7.43)
pH, Ven: 7.25 (ref 7.25–7.43)
pO2, Ven: 42 mmHg (ref 32–45)
pO2, Ven: 38 mmHg (ref 32–45)

## 2021-06-14 LAB — COMPREHENSIVE METABOLIC PANEL
ALT: 13 U/L (ref 0–44)
AST: 16 U/L (ref 15–41)
Albumin: 2.9 g/dL — ABNORMAL LOW (ref 3.5–5.0)
Alkaline Phosphatase: 79 U/L (ref 38–126)
Anion gap: 7 (ref 5–15)
BUN: 49 mg/dL — ABNORMAL HIGH (ref 8–23)
CO2: 24 mmol/L (ref 22–32)
Calcium: 9.3 mg/dL (ref 8.9–10.3)
Chloride: 106 mmol/L (ref 98–111)
Creatinine, Ser: 3.9 mg/dL — ABNORMAL HIGH (ref 0.44–1.00)
GFR, Estimated: 12 mL/min — ABNORMAL LOW (ref >60)
Glucose, Bld: 343 mg/dL — ABNORMAL HIGH (ref 70–99)
Potassium: 4.2 mmol/L (ref 3.5–5.1)
Sodium: 137 mmol/L (ref 135–145)
Total Bilirubin: 0.4 mg/dL (ref 0.3–1.2)
Total Protein: 7.2 g/dL (ref 6.5–8.1)

## 2021-06-14 LAB — CBC
HCT: 39.3 % (ref 36.0–46.0)
Hemoglobin: 11.5 g/dL — ABNORMAL LOW (ref 12.0–15.0)
MCH: 27.1 pg (ref 26.0–34.0)
MCHC: 29.3 g/dL — ABNORMAL LOW (ref 30.0–36.0)
MCV: 92.7 fL (ref 80.0–100.0)
Platelets: 223 10*3/uL (ref 150–400)
RBC: 4.24 MIL/uL (ref 3.87–5.11)
RDW: 13 % (ref 11.5–15.5)
WBC: 8.6 10*3/uL (ref 4.0–10.5)
nRBC: 0 % (ref 0.0–0.2)

## 2021-06-14 LAB — CBG MONITORING, ED
Glucose-Capillary: 185 mg/dL — ABNORMAL HIGH (ref 70–99)
Glucose-Capillary: 187 mg/dL — ABNORMAL HIGH (ref 70–99)

## 2021-06-14 LAB — GLUCOSE, CAPILLARY
Glucose-Capillary: 334 mg/dL — ABNORMAL HIGH (ref 70–99)
Glucose-Capillary: 358 mg/dL — ABNORMAL HIGH (ref 70–99)
Glucose-Capillary: 404 mg/dL — ABNORMAL HIGH (ref 70–99)

## 2021-06-14 LAB — TROPONIN I (HIGH SENSITIVITY): Troponin I (High Sensitivity): 40 ng/L — ABNORMAL HIGH (ref <18)

## 2021-06-14 LAB — LACTIC ACID, PLASMA
Lactic Acid, Venous: 0.7 mmol/L (ref 0.5–1.9)
Lactic Acid, Venous: 0.8 mmol/L (ref 0.5–1.9)

## 2021-06-14 LAB — HIV ANTIBODY (ROUTINE TESTING W REFLEX): HIV Screen 4th Generation wRfx: NONREACTIVE

## 2021-06-14 MED ORDER — HEPARIN SODIUM (PORCINE) 5000 UNIT/ML IJ SOLN
5000.0000 [IU] | Freq: Three times a day (TID) | INTRAMUSCULAR | Status: DC
  Administered 2021-06-14 – 2021-06-18 (×13): 5000 [IU] via SUBCUTANEOUS
  Filled 2021-06-14 (×13): qty 1

## 2021-06-14 MED ORDER — IPRATROPIUM-ALBUTEROL 0.5-2.5 (3) MG/3ML IN SOLN
3.0000 mL | Freq: Once | RESPIRATORY_TRACT | Status: AC
  Administered 2021-06-14: 3 mL via RESPIRATORY_TRACT
  Filled 2021-06-14: qty 3

## 2021-06-14 MED ORDER — AMLODIPINE BESYLATE 5 MG PO TABS
5.0000 mg | ORAL_TABLET | Freq: Every day | ORAL | Status: DC
  Administered 2021-06-15 – 2021-06-18 (×4): 5 mg via ORAL
  Filled 2021-06-14 (×4): qty 1

## 2021-06-14 MED ORDER — METHYLPREDNISOLONE SODIUM SUCC 125 MG IJ SOLR
125.0000 mg | Freq: Once | INTRAMUSCULAR | Status: AC
Start: 2021-06-14 — End: 2021-06-14
  Administered 2021-06-14: 125 mg via INTRAVENOUS
  Filled 2021-06-14: qty 2

## 2021-06-14 MED ORDER — BUDESONIDE 0.5 MG/2ML IN SUSP
2.0000 mg | Freq: Two times a day (BID) | RESPIRATORY_TRACT | Status: DC
  Administered 2021-06-14: 0.5 mg via RESPIRATORY_TRACT
  Filled 2021-06-14: qty 8

## 2021-06-14 MED ORDER — MODAFINIL 100 MG PO TABS
100.0000 mg | ORAL_TABLET | Freq: Every day | ORAL | Status: DC
  Administered 2021-06-14 – 2021-06-18 (×5): 100 mg via ORAL
  Filled 2021-06-14 (×6): qty 1

## 2021-06-14 MED ORDER — ACETYLCYSTEINE 600 MG PO CAPS
1.0000 | ORAL_CAPSULE | Freq: Every day | ORAL | Status: DC
Start: 2021-06-14 — End: 2021-06-14

## 2021-06-14 MED ORDER — IPRATROPIUM-ALBUTEROL 0.5-2.5 (3) MG/3ML IN SOLN
3.0000 mL | Freq: Four times a day (QID) | RESPIRATORY_TRACT | Status: DC
  Administered 2021-06-14 – 2021-06-16 (×9): 3 mL via RESPIRATORY_TRACT
  Filled 2021-06-14 (×9): qty 3

## 2021-06-14 MED ORDER — CARVEDILOL 25 MG PO TABS
25.0000 mg | ORAL_TABLET | Freq: Two times a day (BID) | ORAL | Status: DC
  Administered 2021-06-14 – 2021-06-18 (×8): 25 mg via ORAL
  Filled 2021-06-14 (×8): qty 1

## 2021-06-14 MED ORDER — SODIUM CHLORIDE 0.9 % IV SOLN: 250.0000 mL | INTRAVENOUS | Status: DC | PRN

## 2021-06-14 MED ORDER — ARMODAFINIL 50 MG PO TABS: 50.0000 mg | ORAL_TABLET | Freq: Every day | ORAL | Status: DC

## 2021-06-14 MED ORDER — METHIMAZOLE 5 MG PO TABS
5.0000 mg | ORAL_TABLET | Freq: Every day | ORAL | Status: DC
  Administered 2021-06-15 – 2021-06-18 (×4): 5 mg via ORAL
  Filled 2021-06-14 (×5): qty 1

## 2021-06-14 MED ORDER — ATORVASTATIN CALCIUM 20 MG PO TABS
40.0000 mg | ORAL_TABLET | Freq: Every day | ORAL | Status: DC
  Administered 2021-06-14 – 2021-06-17 (×4): 40 mg via ORAL
  Filled 2021-06-14 (×4): qty 2

## 2021-06-14 MED ORDER — SODIUM CHLORIDE 0.9% FLUSH: 3.0000 mL | INTRAVENOUS | Status: DC | PRN

## 2021-06-14 MED ORDER — SODIUM CHLORIDE 0.9% FLUSH
3.0000 mL | Freq: Two times a day (BID) | INTRAVENOUS | Status: DC
  Administered 2021-06-14 – 2021-06-18 (×9): 3 mL via INTRAVENOUS

## 2021-06-14 MED ORDER — ASPIRIN 81 MG PO TBEC
81.0000 mg | DELAYED_RELEASE_TABLET | Freq: Every day | ORAL | Status: DC
  Administered 2021-06-15 – 2021-06-18 (×4): 81 mg via ORAL
  Filled 2021-06-14 (×4): qty 1

## 2021-06-14 MED ORDER — FUROSEMIDE 10 MG/ML IJ SOLN
40.0000 mg | Freq: Two times a day (BID) | INTRAMUSCULAR | Status: DC
  Administered 2021-06-14 – 2021-06-16 (×5): 40 mg via INTRAVENOUS
  Filled 2021-06-14 (×4): qty 4

## 2021-06-14 MED ORDER — ENOXAPARIN SODIUM 30 MG/0.3ML IJ SOSY: 30.0000 mg | PREFILLED_SYRINGE | INTRAMUSCULAR | Status: DC

## 2021-06-14 MED ORDER — INSULIN ASPART 100 UNIT/ML IJ SOLN
0.0000 [IU] | INTRAMUSCULAR | Status: DC
  Administered 2021-06-14: 7 [IU] via SUBCUTANEOUS
  Administered 2021-06-14: 9 [IU] via SUBCUTANEOUS
  Administered 2021-06-15 (×2): 5 [IU] via SUBCUTANEOUS
  Administered 2021-06-15: 3 [IU] via SUBCUTANEOUS
  Administered 2021-06-15: 2 [IU] via SUBCUTANEOUS
  Administered 2021-06-15: 9 [IU] via SUBCUTANEOUS
  Administered 2021-06-15: 3 [IU] via SUBCUTANEOUS
  Administered 2021-06-16 (×5): 2 [IU] via SUBCUTANEOUS
  Administered 2021-06-16 (×2): 3 [IU] via SUBCUTANEOUS
  Administered 2021-06-17 (×2): 2 [IU] via SUBCUTANEOUS
  Administered 2021-06-17 (×2): 1 [IU] via SUBCUTANEOUS
  Administered 2021-06-17: 3 [IU] via SUBCUTANEOUS
  Administered 2021-06-17: 2 [IU] via SUBCUTANEOUS
  Administered 2021-06-18 (×2): 1 [IU] via SUBCUTANEOUS
  Administered 2021-06-18: 3 [IU] via SUBCUTANEOUS
  Filled 2021-06-14 (×22): qty 1

## 2021-06-14 MED ORDER — SODIUM CHLORIDE 0.9 % IV SOLN: INTRAVENOUS | Status: AC

## 2021-06-14 MED ORDER — VITAMIN D (ERGOCALCIFEROL) 1.25 MG (50000 UNIT) PO CAPS
50000.0000 [IU] | ORAL_CAPSULE | ORAL | Status: DC
  Administered 2021-06-15: 50000 [IU] via ORAL
  Filled 2021-06-14: qty 1

## 2021-06-14 MED ORDER — ONDANSETRON HCL 4 MG PO TABS: 4.0000 mg | ORAL_TABLET | Freq: Four times a day (QID) | ORAL | Status: DC | PRN

## 2021-06-14 MED ORDER — ALBUTEROL SULFATE (2.5 MG/3ML) 0.083% IN NEBU: 2.5000 mg | INHALATION_SOLUTION | RESPIRATORY_TRACT | Status: DC | PRN

## 2021-06-14 MED ORDER — PANTOPRAZOLE SODIUM 40 MG PO TBEC
40.0000 mg | DELAYED_RELEASE_TABLET | Freq: Every day | ORAL | Status: DC
  Administered 2021-06-15 – 2021-06-18 (×4): 40 mg via ORAL
  Filled 2021-06-14 (×4): qty 1

## 2021-06-14 MED ORDER — ACETAMINOPHEN 325 MG PO TABS: 650.0000 mg | ORAL_TABLET | Freq: Four times a day (QID) | ORAL | Status: DC | PRN

## 2021-06-14 MED ORDER — ACETAMINOPHEN 650 MG RE SUPP: 650.0000 mg | Freq: Four times a day (QID) | RECTAL | Status: DC | PRN

## 2021-06-14 MED ORDER — CYCLOBENZAPRINE HCL 10 MG PO TABS: 10.0000 mg | ORAL_TABLET | Freq: Three times a day (TID) | ORAL | Status: DC | PRN

## 2021-06-14 MED ORDER — ONDANSETRON HCL 4 MG/2ML IJ SOLN: 4.0000 mg | Freq: Four times a day (QID) | INTRAMUSCULAR | Status: DC | PRN

## 2021-06-14 NOTE — Assessment & Plan Note (Addendum)
Patient has chronic kidney disease related to her known diabetes mellitus with a baseline serum creatinine of 3.23.  Lab Results  Component Value Date   CREATININE 4.55 (H) 06/16/2021   CREATININE 4.03 (H) 06/15/2021   CREATININE 3.90 (H) 06/14/2021   Her kidney function is worsening.  Nephrology following and may need AV access and consider renal replacement therapy in the near future.  Also being evaluated by transplant as per [facility].

## 2021-06-14 NOTE — H&P (Addendum)
History and Physical    Patient: Emily [name] QAS:[id number] DOB: 1960-11-28 DOA: 06/14/2021 DOS: the patient was seen and examined on 06/14/2021 PCP: [facility]  Patient coming from: Home  Chief Complaint:  Chief Complaint  Patient presents with   Hypoglycemia   HPI: Emily [name] is a 61 y.o. female with medical history significant for diabetes mellitus with complications of stage IV-V chronic kidney disease, hypertension, obstructive sleep apnea, COPD with chronic respiratory failure on 4 L of oxygen continuous, history of chronic diastolic dysfunction CHF who was brought into the ER by EMS for evaluation of mental status changes. Per EMS patient's mother had found her unresponsive this morning and called EMS.  When EMS arrived patient was able to respond to verbal stimuli.  Her blood sugar was 57.  She received 30 g of oral glucose and was more responsive but said to be lethargic.  Patient is on insulin and admits to taking her insulin last night but is unsure if she had dinner.  She also received 250 cc of D10 infusion and upon arrival to the ER her blood sugar was 131. She had pulse oximetry of 89% and it is unclear if this was on her 4 L of oxygen on room air.  She was initially placed on a nonrebreather mask with improvement in her pulse oximetry to 98%. She states she has been congested for the last several days and has a cough that is nonproductive but denies having any fever or chills.  She has had increased nasal drainage.  She denies having any fever, no chills, no sick contacts, no headache, no dizziness, no lightheadedness, no chest pain, no shortness of breath, no urinary frequency, no nocturia, no dysuria, no abdominal pain, no nausea or vomiting, no leg swelling, no blurred vision no focal deficit. She was hypothermic upon arrival to the ER with a Tmax of 93.4 F, tachypneic with respiratory rate 24.  She is currently on a Retail banker   Review of  Systems: As mentioned in the history of present illness. All other systems reviewed and are negative. Past Medical History:  Diagnosis Date   Arthritis    hands, all over   Asthma    CHF (congestive heart failure) (HCC)    COPD (chronic obstructive pulmonary disease) (HCC)    on 2L home o2   Diabetes mellitus without complication (HCC)    type 2   Dyspnea    Hyperlipidemia    Hypertension    Sleep apnea    CPAP   Stroke [facility])    Was told at recent hospital visit she has stroke in past   Thyroid disease    Thyroid nodule 04/30/2016   Past Surgical History:  Procedure Laterality Date   arm surgery Left    COLONOSCOPY WITH PROPOFOL N/A 02/14/2019   Procedure: COLONOSCOPY WITH PROPOFOL;  Surgeon: [name], MD;  Location: [facility] ENDOSCOPY;  Service: Endoscopy;  Laterality: N/A;   Social History:  reports that she quit smoking about 5 years ago. Her smoking use included cigarettes. She has a 60.00 pack-year smoking history. She has never used smokeless tobacco. She reports that she does not drink alcohol and does not use drugs.  No Known Allergies  Family History  Problem Relation Age of Onset   Diabetes Mother    Hypertension Mother    Diabetes Father    Hypertension Father    CAD Father    Diabetes Sister     Prior to Admission medications  Medication Sig Start Date End Date Taking? Authorizing Provider  ACCU-CHEK GUIDE test strip USE TO CHECK BLOOD SUGAR THREE TIMES DAILY 01/01/21   Philemon Kingdom, MD  Accu-Chek Softclix Lancets lancets USE TO CHECK BLOOD SUGAR THREE TIMES DAILY AS INSTRUCTED 03/05/21   Philemon Kingdom, MD  Acetylcysteine 600 MG CAPS Take 1 capsule by mouth daily. 04/26/20   [provider]  albuterol (PROVENTIL HFA;VENTOLIN HFA) 108 (90 Base) MCG/ACT inhaler Inhale 1-2 puffs into the lungs every 6 (six) hours as needed for wheezing or shortness of breath. 07/16/16   Doles-Johnson, Teah, NP  albuterol (PROVENTIL) (2.5 MG/3ML) 0.083% nebulizer  solution Take 3 mLs (2.5 mg total) by nebulization every 6 (six) hours as needed for wheezing or shortness of breath. Patient not taking: Reported on 03/27/2021 11/18/17   Loletha Grayer, MD  amLODipine (NORVASC) 5 MG tablet Take 1 tablet by mouth daily. 07/15/20   [provider]  Armodafinil 50 MG tablet Take 50 mg by mouth daily.    [provider]  aspirin EC 81 MG tablet Take 81 mg by mouth daily.    [provider]  atorvastatin (LIPITOR) 40 MG tablet TAKE ONE TABLET BY MOUTH EVERY DAY Patient taking differently: Take 40 mg by mouth at bedtime. 04/21/16   Tawni Millers, MD  BD PEN NEEDLE MICRO U/F 32G X 6 MM MISC SMARTSIG:Pre-Filled Pen Syringe Injection 3 Times Daily 09/22/19   [provider]  Blood Glucose Monitoring Suppl (ACCU-CHEK GUIDE) w/Device KIT Use as instructed to check blood sugar 3 times a day. E11.59 09/05/19   Philemon Kingdom, MD  budesonide-formoterol (SYMBICORT) 80-4.5 MCG/ACT inhaler Inhale 2 puffs into the lungs 2 (two) times daily. 11/18/17   Loletha Grayer, MD  carvedilol (COREG) 25 MG tablet Take 1 tablet by mouth twice daily 04/15/21   Alisa Graff, FNP  chlorthalidone (HYGROTON) 25 MG tablet Take 25 mg by mouth daily.    [provider]  cyclobenzaprine (FLEXERIL) 10 MG tablet Take 1 tablet by mouth 3 (three) times daily as needed. 08/12/20   [provider]  esomeprazole (NEXIUM) 40 MG capsule Take 40 mg by mouth daily. am    [provider]  fluticasone (FLONASE) 50 MCG/ACT nasal spray Place 1 spray into both nostrils daily.    [provider]  insulin glargine (LANTUS SOLOSTAR) 100 UNIT/ML Solostar Pen INJECT 30 UNITS SUBCUTANEOUSLY IN THE MORNING AND 30 UNITS AT NIGHT Patient taking differently: 35 Units 2 (two) times daily. 09/19/20   Philemon Kingdom, MD  ipratropium (ATROVENT) 0.06 % nasal spray Place 2 sprays into both nostrils 4 (four) times daily.    [provider]   methimazole (TAPAZOLE) 5 MG tablet Take 1 tablet (5 mg total) by mouth daily. 12/05/20   Philemon Kingdom, MD  NOVOLOG FLEXPEN 100 UNIT/ML FlexPen Inject 18-24 units 2x a day before meals Patient taking differently: Inject 24-28 units 2x a day before meals 09/19/20   Philemon Kingdom, MD  potassium chloride (MICRO-K) 10 MEQ CR capsule Take 10 mEq by mouth 2 (two) times daily.    [provider]  Semaglutide, 1 MG/DOSE, (OZEMPIC, 1 MG/DOSE,) 4 MG/3ML SOPN Inject 1 mg into the skin once a week. Patient taking differently: Inject 1 mg into the skin once a week. 09/19/20   Philemon Kingdom, MD  tiotropium (SPIRIVA) 18 MCG inhalation capsule Place 18 mcg into inhaler and inhale daily.    [provider]  Vitamin D, Ergocalciferol, (DRISDOL) 1.25 MG (50000 UNIT) CAPS  capsule Take 50,000 Units by mouth every 7 (seven) days.    [provider]    Physical Exam: Vitals:   06/14/21 0720 06/14/21 0722 06/14/21 0733 06/14/21 0815  BP:  (!) 148/63  130/66  Pulse:    73  Resp:    (!) 24  Temp:   (!) 93.4 F (34.1 C)   TempSrc:   Rectal   SpO2:    (!) 86%  Weight: 99 kg     Height: '5\' 4"'  (1.626 m)      Physical Exam Vitals and nursing note reviewed.  Constitutional:      Appearance: Normal appearance.     Comments: Chronically ill-appearing.  Awake, alert and oriented to person place and time  HENT:     Head: Normocephalic and atraumatic.     Nose: Nose normal.     Mouth/Throat:     Mouth: Mucous membranes are moist.  Eyes:     Comments: Exophthalmos  Cardiovascular:     Rate and Rhythm: Normal rate and regular rhythm.  Pulmonary:     Effort: Pulmonary effort is normal.     Breath sounds: Wheezing present.     Comments: Scattered expiratory wheezes Abdominal:     General: Abdomen is flat. Bowel sounds are normal.     Palpations: Abdomen is soft.  Musculoskeletal:        General: Normal range of motion.     Cervical back: Normal range of motion and neck  supple.  Skin:    General: Skin is warm and dry.  Neurological:     General: No focal deficit present.     Mental Status: She is alert and oriented to person, place, and time.  Psychiatric:        Mood and Affect: Mood normal.        Behavior: Behavior normal.     Data Reviewed: Relevant notes from primary care and specialist visits, past discharge summaries as available in EHR, including Care Everywhere. Prior diagnostic testing as pertinent to current admission diagnoses Updated medications and problem lists for reconciliation ED course, including vitals, labs, imaging, treatment and response to treatment Triage notes, nursing and pharmacy notes and ED provider's notes Notable results as noted in HPI Labs reviewed.  Glucose 185, sodium 137, potassium 4.2, chloride 106, bicarb 24, glucose 343, BUN 49, creatinine 3.90, calcium 9.3, total protein 7.2, albumin 2.9, AST 16, ALT 13, alk phos 79, total bilirubin 0.4, troponin 40, lactic acid 0.7, white count 8.6, hemoglobin 11.5, hematocrit 39.3, platelet count 223 Chest x-ray reviewed by me shows Cardiomegaly with mild pulmonary vascular congestion. Streaky left mid to lower lung opacities favored to be atelectasis/scarring. Twelve-lead EKG reviewed by me shows sinus rhythm with nonspecific intraventricular conduction delay. There are no new results to review at this time.  Assessment and Plan: * Hypoglycemia Patient presented to the ER after she was found unresponsive at home and with blood sugar of 57 that improved following oral supplementation of glucose as well as 250 cc of D10 infusion Chart review shows that she is on Lantus 30 units twice daily with a NovoLog FlexPen sliding scale coverage Patient states that she had taken her insulin last night but is unsure if she had eaten dinner. Due to her worsening renal function she may need adjustment of her insulin dose to prevent further episodes of hypoglycemia or we will consider adding  a bedtime snack Encourage oral intake Check blood sugars with meals Hold Lantus for now  Acute on chronic respiratory failure with hypoxia and hypercapnia (HCC) Most likely secondary to acute COPD exacerbation At baseline patient wears 4 L of oxygen but upon presentation she had pulse oximetry of 89% on 4L with increased work of breathing and diffuse wheezes Venous blood gas showed uncompensated respiratory acidosis Patient is currently on BiPAP to improve oxygenation and reduce work of breathing We will attempt to wean off BiPAP as tolerated Maintain pulse oximetry greater than 92%  COPD with acute exacerbation ([place]) Patient with a history of COPD and chronic respiratory failure who presents for evaluation of mental status changes and noted to have diffuse wheezing on exam. She has a nonproductive cough and nasal congestion Place patient on scheduled and as needed bronchodilator therapy She received a dose of Solu-Medrol 125 mg IV x1 Place patient on budesonide twice daily Continue oxygen supplementation to maintain pulse oximetry greater than 92%  CKD stage 4 due to type 2 diabetes mellitus ([place]) Patient has chronic kidney disease related to her known diabetes mellitus with a baseline serum creatinine of 3.23 Today on admission her serum creatinine is 3.90 We will hold chlorthalidone Judicious IV fluid hydration Consult nephrology Repeat renal parameters in a.m.  Hyperthyroidism Stable Continue Tapazole  Essential hypertension Blood pressure is  Stable Continue amlodipine and carvedilol   Sleep apnea Continue CPAP at bedtime  Hypothermia Most likely related to hypoglycemia Patient is currently on a Bair hugger Continue until temperature is 98 or greater      Advance Care Planning:   Code Status: Full Code   Consults: None  Family Communication: Greater than 50% of time was spent discussing patient's condition and plan of care with her at the bedside.  All  questions and concerns have been addressed.  She verbalizes understanding and agrees with the plan.    Severity of Illness: The appropriate patient status for this patient is INPATIENT. Inpatient status is judged to be reasonable and necessary in order to provide the required intensity of service to ensure the patient's safety. The patient's presenting symptoms, physical exam findings, and initial radiographic and laboratory data in the context of their chronic comorbidities is felt to place them at high risk for further clinical deterioration. Furthermore, it is not anticipated that the patient will be medically stable for discharge from the hospital within 2 midnights of admission.   * I certify that at the point of admission it is my clinical judgment that the patient will require inpatient hospital care spanning beyond 2 midnights from the point of admission due to high intensity of service, high risk for further deterioration and high frequency of surveillance required.*  Author: Collier Bullock, MD 06/14/2021 10:28 AM  For on call review [email or web address].

## 2021-06-14 NOTE — Assessment & Plan Note (Addendum)
Most likely secondary to acute COPD exacerbation.  Initially quiring BiPAP, but since then has been able to wean down to 4 L nasal cannula which is her baseline oxygen requirement. Continue steroids.

## 2021-06-14 NOTE — Assessment & Plan Note (Signed)
Stable Continue Tapazole

## 2021-06-14 NOTE — Assessment & Plan Note (Addendum)
Most likely related to hypoglycemia Patient placed on Bair hugger and temperature now normalized

## 2021-06-14 NOTE — Assessment & Plan Note (Signed)
Blood pressure is  Stable Continue amlodipine and carvedilol.

## 2021-06-14 NOTE — Assessment & Plan Note (Signed)
Continue CPAP at bedtime 

## 2021-06-14 NOTE — ED Triage Notes (Signed)
Pt to ED via [facility] from home for diabetic issues. Per EMS pts mother found her unresponsive and gurgling this morning and called EMS. EMS reported that when they arrived pt was responsive to verbal. Pts CBG was 57. Pt was given 30 grams of oral glucose and became more responsive, pt was A & O x 4 but lethargic. Pt does take insulin and took her insulin last night but is not sure how much she took. Pt was given infusion of 250 mL of D10. Pt CBG on arrival to ED 131.Per EMS pt wears 4 liter of oxygen at baseline, Pts initial SpO2 89%, pt was put on a NRB and came up to 98%.  Pt arrivals to ED on NRB, pt has audible wheezing and cough.   BP: 140/palp HR 70s

## 2021-06-14 NOTE — ED Notes (Signed)
Informed RN bed assigned 

## 2021-06-14 NOTE — Progress Notes (Signed)
Pt taken to CT on BIPAP and back to ER without issues.

## 2021-06-14 NOTE — ED Notes (Signed)
Pt removed from BIPAP. Placed on [place] at 4 liters.

## 2021-06-14 NOTE — Assessment & Plan Note (Signed)
Patient with a history of COPD and chronic respiratory failure who presents for evaluation of mental status changes and noted to have diffuse wheezing on exam. She has a nonproductive cough and nasal congestion Place patient on scheduled and as needed bronchodilator therapy She received a dose of Solu-Medrol 125 mg IV x1 Place patient on budesonide twice daily Continue oxygen supplementation to maintain pulse oximetry greater than 92%

## 2021-06-14 NOTE — ED Notes (Signed)
RN to bedside. Pt back from CT. Not hooked up to suction for her purewick. RN hooked back up to suction.

## 2021-06-14 NOTE — ED Notes (Signed)
Pt pulse ox 89% on 5 liters. MD made aware. Will attempt BIPAP to fix blood gas and oxygenation.

## 2021-06-14 NOTE — ED Provider Notes (Signed)
[facility] Provider Note    Event Date/Time   First MD Initiated Contact with Patient 06/14/21 [phone]     (approximate)   History   Altered mental status   HPI  Emily [name] is a 61 y.o. female with a history of CHF, COPD, diabetes, hypertension who presents with reported altered mental status.  Per EMS patient was found to be "gurgling this morning by family ".  They found her glucose to be 57 reportedly gave oral glucose with some improvement, however then she seemed to worsen, they did give D10.  Patient apparently wears 4 L nasal cannula at baseline for COPD/CHF     Physical Exam   Triage Vital Signs: ED Triage Vitals  Enc Vitals Group     BP      Pulse      Resp      Temp      Temp src      SpO2      Weight      Height      Head Circumference      Peak Flow      Pain Score      Pain Loc      Pain Edu?      Excl. in [place]?     Most recent vital signs: Vitals:   06/14/21 0733 06/14/21 0815  BP:  130/66  Pulse:  73  Resp:  (!) 24  Temp: (!) 93.4 F (34.1 C)   SpO2:  (!) 86%     General: Awake, ill-appearing CV:  Good peripheral perfusion.  Resp:  Mildly increased respiratory effort with tachypnea, scattered wheezes Abd:  No distention.  Other:  No significant lower extremity edema   ED Results / Procedures / Treatments   Labs (all labs ordered are listed, but only abnormal results are displayed) Labs Reviewed  CBC - Abnormal; Notable for the following components:      Result Value   Hemoglobin 11.5 (*)    MCHC 29.3 (*)    All other components within normal limits  COMPREHENSIVE METABOLIC PANEL - Abnormal; Notable for the following components:   Glucose, Bld 343 (*)    BUN 49 (*)    Creatinine, Ser 3.90 (*)    Albumin 2.9 (*)    GFR, Estimated 12 (*)    All other components within normal limits  BLOOD GAS, VENOUS - Abnormal; Notable for the following components:   pH, Ven 7.16 (*)    pCO2, Ven 70 (*)     Acid-base deficit 5.1 (*)    All other components within normal limits  CBG MONITORING, ED - Abnormal; Notable for the following components:   Glucose-Capillary 185 (*)    All other components within normal limits  TROPONIN I (HIGH SENSITIVITY) - Abnormal; Notable for the following components:   Troponin I (High Sensitivity) 40 (*)    All other components within normal limits  LACTIC ACID, PLASMA  LACTIC ACID, PLASMA     EKG  ED ECG REPORT I, Lavonia Drafts, the attending physician, personally viewed and interpreted this ECG.  Date: 06/14/2021  Rhythm: normal sinus rhythm QRS Axis: normal Intervals: IVCD ST/T Wave abnormalities: normal Narrative Interpretation: no evidence of acute ischemia    RADIOLOGY Chest x-ray viewed interpreted by me, no acute abnormality    PROCEDURES:  Critical Care performed: yes  CRITICAL CARE Performed by: Lavonia Drafts   Total critical care time: 30 minutes  Critical care time was exclusive  of separately billable procedures and treating other patients.  Critical care was necessary to treat or prevent imminent or life-threatening deterioration.  Critical care was time spent personally by me on the following activities: development of treatment plan with patient and/or surrogate as well as nursing, discussions with consultants, evaluation of patient's response to treatment, examination of patient, obtaining history from patient or surrogate, ordering and performing treatments and interventions, ordering and review of laboratory studies, ordering and review of radiographic studies, pulse oximetry and re-evaluation of patient's condition.   Procedures   MEDICATIONS ORDERED IN ED: Medications  methylPREDNISolone sodium succinate (SOLU-MEDROL) 125 mg/2 mL injection 125 mg (has no administration in time range)  ipratropium-albuterol (DUONEB) 0.5-2.5 (3) MG/3ML nebulizer solution 3 mL (3 mLs Nebulization Given 06/14/21 0745)   ipratropium-albuterol (DUONEB) 0.5-2.5 (3) MG/3ML nebulizer solution 3 mL (3 mLs Nebulization Given 06/14/21 0745)     IMPRESSION / MDM / ASSESSMENT AND PLAN / ED COURSE  I reviewed the triage vital signs and the nursing notes. Patient's presentation is most consistent with acute presentation with potential threat to life or bodily function.  Patient presents with altered mental status as detailed above.  Differential includes hypoglycemia, sepsis, infection, electrolyte abnormality  Hypoglycemia may be a cause of her symptoms however we have not seen an improvement as I would expect after a D10 infusion.  We will obtain lactate, VBG to evaluate for CO2, chest x-ray, CBC, CMP, troponin and place the patient on the cardiac monitor.  ----------------------------------------- 7:26 AM on 06/14/2021 ----------------------------------------- Notified of rectal temperature of 93 degrees, which could be consistent with hypoglycemia versus sepsis.  We will start the patient on Bair hugger  ----------------------------------------- 8:25 AM on 06/14/2021 ----------------------------------------- Patient's VBG demonstrates a CO2 of 70, she is satting in the high 80s on 5 L nasal cannula, will start the patient on BiPAP as I expect her elevated CO2 is affecting her mental status  I have consulted the hospitalist for admission.  Her lactic acid is normal      FINAL CLINICAL IMPRESSION(S) / ED DIAGNOSES   Final diagnoses:  Acute on chronic respiratory failure with hypoxia and hypercapnia (HCC)  Hypothermia, initial encounter  Hypoglycemia     Rx / DC Orders   ED Discharge Orders     None        Note:  This document was prepared using Dragon voice recognition software and may include unintentional dictation errors.   Lavonia Drafts, MD 06/14/21 [phone]

## 2021-06-14 NOTE — Progress Notes (Signed)
[facility] CONSULT NOTE    Date: 06/14/2021                  Patient Name:  Emily [name]  MRN: [id number]  DOB: [birth year]  Age / Sex: 61 y.o., female         PCP: [facility]                 Service Requesting Consult: Medicine                  Reason for Consult acute kidney injury             History of Present Illness: Patient is a 62 y.o. 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  She is found to be hypoglycemic and has received D50.  Patient is presently on BiPAP secondary to shortness of breath.  Chest x-ray shows mild PVC.  For chronic kidney disease she has been followed by [facility].  Her last creatinine was 3.23 with a GFR of about 16 cc/min.  Patient has significant family history of end-stage renal disease.  Her mother is on dialysis.  She had been on chlorthalidone at 25 mg daily.  She had a history of nephrotic range proteinuria but was not able to be started on ACE inhibitors or ARB secondary to her renal status.  Medications: Outpatient medications: (Not in a hospital admission)   Discontinued Meds:   Medications Discontinued During This Encounter  Medication Reason   enoxaparin (LOVENOX) injection 30 mg     Current medications: Current Facility-Administered Medications  Medication Dose Route Frequency Provider Last Rate Last Admin   0.9 %  sodium chloride infusion  250 mL Intravenous PRN Agbata, Tochukwu, MD       0.9 %  sodium chloride infusion   Intravenous Continuous Agbata, Tochukwu, MD       acetaminophen (TYLENOL) tablet 650 mg  650 mg Oral Q6H PRN Agbata, Tochukwu, MD       Or   acetaminophen (TYLENOL) suppository 650 mg  650 mg Rectal Q6H PRN Agbata, Tochukwu,  MD       Acetylcysteine CAPS 600 mg  1 capsule Oral Daily Agbata, Tochukwu, MD       albuterol (PROVENTIL) (2.5 MG/3ML) 0.083% nebulizer solution 2.5 mg  2.5 mg Nebulization Q2H PRN Agbata, Tochukwu, MD       amLODipine (NORVASC) tablet 5 mg  5 mg Oral Daily Agbata, Tochukwu, MD       Armodafinil 50 mg  50 mg Oral Daily Agbata, Tochukwu, MD       aspirin EC tablet 81 mg  81 mg Oral Daily Agbata, Tochukwu, MD       atorvastatin (LIPITOR) tablet 40 mg  40 mg Oral QHS Agbata, Tochukwu, MD       budesonide (PULMICORT) nebulizer solution 2 mg  2 mg Nebulization Q12H Agbata, Tochukwu, MD       carvedilol (COREG) tablet 25 mg  25 mg Oral BID Agbata, Tochukwu, MD       cyclobenzaprine (FLEXERIL) tablet 10 mg  10 mg Oral TID PRN Agbata, Tochukwu, MD       heparin injection 5,000 Units  5,000 Units Subcutaneous Q8H Beers, Brandon D, RPH       insulin aspart (novoLOG) injection 0-9 Units  0-9 Units Subcutaneous Q4H Agbata, Tochukwu, MD       ipratropium-albuterol (DUONEB) 0.5-2.5 (3) MG/3ML nebulizer solution 3 mL  3 mL Nebulization Q6H Agbata, Tochukwu, MD       methimazole (TAPAZOLE) tablet 5 mg  5 mg Oral Daily Agbata, Tochukwu, MD       ondansetron (ZOFRAN) tablet 4 mg  4 mg Oral Q6H PRN Agbata, Tochukwu, MD       Or   ondansetron (ZOFRAN) injection 4 mg  4 mg Intravenous Q6H PRN Agbata, Tochukwu, MD       pantoprazole (PROTONIX) EC tablet 40 mg  40 mg Oral Daily Agbata, Tochukwu, MD       sodium chloride flush (NS) 0.9 % injection 3 mL  3 mL Intravenous Q12H Agbata, Tochukwu, MD   3 mL at 06/14/21 1043   sodium chloride flush (NS) 0.9 % injection 3 mL  3 mL Intravenous PRN Agbata, Tochukwu, MD       Vitamin D (Ergocalciferol) (DRISDOL) capsule 50,000 Units  50,000 Units Oral Q7 days Agbata, Tochukwu, MD       Current Outpatient Medications  Medication Sig Dispense Refill   ACCU-CHEK GUIDE test strip USE TO CHECK BLOOD SUGAR THREE TIMES DAILY 200 each prn   Accu-Chek Softclix Lancets lancets USE TO  CHECK BLOOD SUGAR THREE TIMES DAILY AS INSTRUCTED 300 each 3   Acetylcysteine 600 MG CAPS Take 1 capsule by mouth daily.     albuterol (PROVENTIL HFA;VENTOLIN HFA) 108 (90 Base) MCG/ACT inhaler Inhale 1-2 puffs into the lungs every 6 (six) hours as needed for wheezing or shortness of breath. 1 Inhaler 11   albuterol (PROVENTIL) (2.5 MG/3ML) 0.083% nebulizer solution Take 3 mLs (2.5 mg total) by nebulization every 6 (six) hours as needed for wheezing or shortness of breath. (Patient not taking: Reported on 03/27/2021) 100 vial 0   amLODipine (NORVASC) 5 MG tablet Take 1 tablet by mouth daily.     Armodafinil 50 MG tablet Take 50 mg by mouth daily.     aspirin EC 81 MG tablet Take 81 mg by mouth daily.     atorvastatin (LIPITOR) 40 MG tablet TAKE ONE TABLET BY MOUTH EVERY DAY (Patient taking differently: Take 40 mg by mouth at bedtime.) 90 tablet PRN   BD PEN NEEDLE MICRO U/F 32G X 6 MM MISC SMARTSIG:Pre-Filled Pen Syringe Injection 3 Times Daily     Blood Glucose Monitoring Suppl (ACCU-CHEK GUIDE) w/Device KIT Use as instructed to check blood sugar 3 times a day. E11.59 1 kit 0   budesonide-formoterol (SYMBICORT) 80-4.5 MCG/ACT inhaler Inhale 2 puffs into the lungs 2 (two) times daily. 1 Inhaler 12   carvedilol (COREG) 25 MG tablet Take 1 tablet by mouth twice daily 180 tablet 3   chlorthalidone (HYGROTON) 25 MG tablet Take 25 mg by mouth daily.     cyclobenzaprine (FLEXERIL) 10 MG tablet Take 1 tablet by mouth 3 (three) times daily as needed.     esomeprazole (NEXIUM) 40 MG capsule Take 40 mg by mouth daily. am     fluticasone (FLONASE) 50 MCG/ACT nasal spray Place 1 spray into both nostrils daily.     insulin glargine (LANTUS SOLOSTAR) 100 UNIT/ML Solostar Pen INJECT 30 UNITS SUBCUTANEOUSLY IN THE MORNING AND 30 UNITS AT NIGHT (Patient taking differently: 35 Units 2 (two) times daily.) 45 mL 3   ipratropium (ATROVENT) 0.06 % nasal spray Place 2 sprays into both nostrils 4 (four) times daily.      methimazole (TAPAZOLE) 5  MG tablet Take 1 tablet (5 mg total) by mouth daily. 90 tablet 3   NOVOLOG FLEXPEN 100 UNIT/ML FlexPen Inject 18-24 units 2x a day before meals (Patient taking differently: Inject 24-28 units 2x a day before meals) 45 mL 3   potassium chloride (MICRO-K) 10 MEQ CR capsule Take 10 mEq by mouth 2 (two) times daily.     Semaglutide, 1 MG/DOSE, (OZEMPIC, 1 MG/DOSE,) 4 MG/3ML SOPN Inject 1 mg into the skin once a week. (Patient taking differently: Inject 1 mg into the skin once a week.) 9 mL 3   tiotropium (SPIRIVA) 18 MCG inhalation capsule Place 18 mcg into inhaler and inhale daily.     Vitamin D, Ergocalciferol, (DRISDOL) 1.25 MG (50000 UNIT) CAPS capsule Take 50,000 Units by mouth every 7 (seven) days.        Allergies: No Known Allergies    Past Medical History: Past Medical History:  Diagnosis Date   Arthritis    hands, all over   Asthma    CHF (congestive heart failure) (HCC)    COPD (chronic obstructive pulmonary disease) (HCC)    on 2L home o2   Diabetes mellitus without complication (HCC)    type 2   Dyspnea    Hyperlipidemia    Hypertension    Sleep apnea    CPAP   Stroke [facility])    Was told at recent hospital visit she has stroke in past   Thyroid disease    Thyroid nodule 04/30/2016     Past Surgical History: Past Surgical History:  Procedure Laterality Date   arm surgery Left    COLONOSCOPY WITH PROPOFOL N/A 02/14/2019   Procedure: COLONOSCOPY WITH PROPOFOL;  Surgeon: [name], MD;  Location: [facility] ENDOSCOPY;  Service: Endoscopy;  Laterality: N/A;     Family History: Family History  Problem Relation Age of Onset   Diabetes Mother    Hypertension Mother    Diabetes Father    Hypertension Father    CAD Father    Diabetes Sister      Social History: Social History   Socioeconomic History   Marital status: Single    Spouse name: Not on file   Number of children: Not on file   Years of education: Not on file   Highest  education level: Not on file  Occupational History   Occupation: unemployed  Tobacco Use   Smoking status: Former    Packs/day: 1.50    Years: 40.00    Total pack years: 60.00    Types: Cigarettes    Quit date: 01/13/2016    Years since quitting: 5.4   Smokeless tobacco: Never  Vaping Use   Vaping Use: Never used  Substance and Sexual Activity   Alcohol use: No   Drug use: No   Sexual activity: Not Currently  Other Topics Concern   Not on file  Social History Narrative   Lives at home with her mother.  Independent at baseline   Social Determinants of Health   Financial Resource Strain: Not on file  Food Insecurity: Not on file  Transportation Needs: Not on file  Physical Activity: Inactive (02/21/2018)   Exercise Vital Sign    Days of Exercise per Week: 0 days    Minutes of Exercise per Session: 0 min  Stress: Not on file  Social Connections: Somewhat Isolated (02/21/2018)   Social Connection and Isolation Panel [NHANES]    Frequency of Communication with Friends and Family: Three times a week  Frequency of Social Gatherings with Friends and Family: Three times a week    Attends Religious Services: 1 to 4 times per year    Active Member of Clubs or Organizations: No    Attends Archivist Meetings: Never    Marital Status: Never married  Human resources officer Violence: Not on file     Review of Systems: As per HPI  Vital Signs: Blood pressure 139/73, pulse 85, temperature (!) 96.4 F (35.8 C), temperature source Axillary, resp. rate 20, height 5' 4" (1.626 m), weight 99 kg, SpO2 92 %.  Weight trends: Filed Weights   06/14/21 0720  Weight: 99 kg    Physical Exam: Physical Exam: General:  No acute distress  Head:  Normocephalic, atraumatic. Moist oral mucosal membranes  Eyes:  Anicteric  Neck:  Supple  Lungs:   Clear to auscultation, normal effort  Heart:  S1S2 no rubs  Abdomen:   Soft, nontender, bowel sounds present  Extremities:  peripheral edema.   Neurologic:  Awake, alert, following commands  Skin:  No lesions  Access:     Lab results:  Basic Metabolic Panel: Recent Labs  Lab 06/14/21 0727  NA 137  K 4.2  CL 106  CO2 24  GLUCOSE 343*  BUN 49*  CREATININE 3.90*  CALCIUM 9.3    Creatinine  Date/Time Value Ref Range Status  02/28/2015 12:00 AM 1.6 (A) 0.5 - 1.1 mg/dL Final  06/02/2013 12:30 PM 0.65 0.60 - 1.30 mg/dL Final  04/23/2013 01:29 PM 1.63 (H) 0.60 - 1.30 mg/dL Final  04/22/2013 10:24 AM 1.39 (H) 0.60 - 1.30 mg/dL Final  10/16/2012 08:12 AM 1.15 0.60 - 1.30 mg/dL Final   Creat  Date/Time Value Ref Range Status  04/05/2018 10:08 AM 1.87 (H) 0.50 - 1.05 mg/dL Final    Comment:    For patients >12 years of age, the reference limit for Creatinine is approximately 13% higher for people identified as African-American. Marland Kitchen   05/24/2017 10:12 AM 1.51 (H) 0.50 - 1.05 mg/dL Final    Comment:    For patients >18 years of age, the reference limit for Creatinine is approximately 13% higher for people identified as African-American. .    Creatinine, Ser  Date/Time Value Ref Range Status  06/14/2021 07:27 AM 3.90 (H) 0.44 - 1.00 mg/dL Final  02/21/2021 10:30 AM 3.23 (H) 0.44 - 1.00 mg/dL Final  04/25/2019 11:38 AM 2.65 (H) 0.44 - 1.00 mg/dL Final  02/18/2018 04:44 AM 2.46 (H) 0.44 - 1.00 mg/dL Final  02/14/2018 05:07 AM 2.46 (H) 0.44 - 1.00 mg/dL Final  02/13/2018 04:47 AM 2.49 (H) 0.44 - 1.00 mg/dL Final  02/12/2018 05:14 PM 2.47 (H) 0.44 - 1.00 mg/dL Final  11/18/2017 04:33 AM 1.92 (H) 0.44 - 1.00 mg/dL Final  11/17/2017 05:08 AM 2.05 (H) 0.44 - 1.00 mg/dL Final  11/16/2017 04:33 AM 2.03 (H) 0.44 - 1.00 mg/dL Final  11/15/2017 07:32 AM 2.07 (H) 0.44 - 1.00 mg/dL Final  11/14/2017 05:30 AM 2.11 (H) 0.44 - 1.00 mg/dL Final  11/13/2017 04:46 AM 1.83 (H) 0.44 - 1.00 mg/dL Final  11/12/2017 04:30 PM 1.60 (H) 0.44 - 1.00 mg/dL Final  08/13/2016 06:07 PM 1.33 (H) 0.44 - 1.00 mg/dL Final  05/14/2016 06:52 PM  1.51 (H) 0.57 - 1.00 mg/dL Final  04/28/2016 07:08 AM 1.22 (H) 0.44 - 1.00 mg/dL Final  04/27/2016 11:56 AM 1.54 (H) 0.44 - 1.00 mg/dL Final  01/14/2016 05:07 PM 1.29 (H) 0.44 - 1.00 mg/dL Final    CBC:  Recent Labs  Lab 06/14/21 0727  WBC 8.6  HGB 11.5*  HCT 39.3  MCV 92.7  PLT 223    Microbiology: Results for orders placed or performed during the hospital encounter of 02/10/19  SARS CORONAVIRUS 2 (TAT 6-24 HRS) Nasopharyngeal Nasopharyngeal Swab     Status: None   Collection Time: 02/10/19 12:05 PM   Specimen: Nasopharyngeal Swab  Result Value Ref Range Status   SARS Coronavirus 2 NEGATIVE NEGATIVE Final    Comment: (NOTE) SARS-CoV-2 target nucleic acids are NOT DETECTED. The SARS-CoV-2 RNA is generally detectable in upper and lower respiratory specimens during the acute phase of infection. Negative results do not preclude SARS-CoV-2 infection, do not rule out co-infections with other pathogens, and should not be used as the sole basis for treatment or other patient management decisions. Negative results must be combined with clinical observations, patient history, and epidemiological information. The expected result is Negative. Fact Sheet for Patients: SugarRoll.be Fact Sheet for Healthcare Providers: https://www.woods-mathews.com/ This test is not yet approved or cleared by the [place] FDA and  has been authorized for detection and/or diagnosis of SARS-CoV-2 by FDA under an Emergency Use Authorization (EUA). This EUA will remain  in effect (meaning this test can be used) for the duration of the COVID-19 declaration under Section 56 4(b)(1) of the Act, 21 U.S.C. section 360bbb-3(b)(1), unless the authorization is terminated or revoked sooner. Performed at [facility], [place] [address] [place], [place] [address]     Urinalysis: No results for input(s): "COLORURINE", "LABSPEC", "PHURINE", "GLUCOSEU", "HGBUR",  "BILIRUBINUR", "KETONESUR", "PROTEINUR", "UROBILINOGEN", "NITRITE", "LEUKOCYTESUR" in the last 72 hours.  Invalid input(s): "APPERANCEUR"   Imaging:  CT CHEST WO CONTRAST  Result Date: 06/14/2021 CLINICAL DATA:  Altered mental status, COPD EXAM: CT CHEST WITHOUT CONTRAST TECHNIQUE: Multidetector CT imaging of the chest was performed following the standard protocol without IV contrast. RADIATION DOSE REDUCTION: This exam was performed according to the departmental dose-optimization program which includes automated exposure control, adjustment of the mA and/or kV according to patient size and/or use of iterative reconstruction technique. COMPARISON:  02/15/2018 FINDINGS: Cardiovascular: Heart size upper limits normal. Trace pericardial fluid. Mild coronary calcifications. Scattered aortic calcifications. Mediastinum/Nodes: Enlarged heterogenous thyroid, which has been recently evaluated with ultrasound. No mediastinal hematoma. 1 cm pre-vascular lymph node (Im42,Se3) , previously 9 mm. 11 mm high right paratracheal node previously 9 mm image 37. No hilar adenopathy, sensitivity decreased in the absence of IV contrast. Lungs/Pleura: No pleural effusion. New linear subsegmental atelectasis in anterior left upper lobe. Interval resolution of previously noted right lower lobe airspace disease. Upper Abdomen: Cholecystectomy clips.  No acute findings. Musculoskeletal: DISH across multiple levels in the mid and lower thoracic spine. IMPRESSION: 1. Anterior left upper lobe linear atelectasis or scarring. 2. Borderline mediastinal lymph node enlargement, possibly reactive but nonspecific. 3. Coronary and Aortic Atherosclerosis (ICD10-170.0). Electronically Signed   By: Lucrezia Europe M.D.   On: 06/14/2021 10:00   DG Chest Port 1 View  Result Date: 06/14/2021 CLINICAL DATA:  Shortness of breath EXAM: PORTABLE CHEST 1 VIEW COMPARISON:  Radiograph 04/25/2019, CT 02/15/2018 FINDINGS: Unchanged enlarged cardiac silhouette.  Mild pulmonary vascular congestion. There are streaky left mid to lower lung opacities. No large pleural effusion. No pneumothorax. No acute osseous abnormality. IMPRESSION: Cardiomegaly with mild pulmonary vascular congestion. Streaky left mid to lower lung opacities favored to be atelectasis/scarring. Electronically Signed   By: Maurine Simmering M.D.   On: 06/14/2021 08:13     Assessment &  Plan:   Principal Problem:   Hypoglycemia Active Problems:   Essential hypertension   Hyperthyroidism   COPD with acute exacerbation (HCC)   Sleep apnea   CKD stage 4 due to type 2 diabetes mellitus (HCC)   Acute on chronic respiratory failure with hypoxia and hypercapnia (HCC)   Hypothermia Patient is a 61 y.o. 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  She is found to be hypoglycemic and has received D50.  Patient is presently on BiPAP secondary to shortness of breath.  Chest x-ray shows mild PVC.  #1: Acute kidney injury on CKD: Patient has CKD stage IV with anemia and proteinuria most likely secondary to chronic diabetic kidney disease.  Patient now has acute kidney injury on the top of chronic kidney disease most likely secondary to the acute process.  Patient may need AV access and possibly start renal replacement therapy in the near future.  Transplant evaluation as per [facility].  #2: Shortness of breath/CHF: We will start her on IV furosemide at 40 mg daily.  #3: Hypoglycemia: We will continue the D10 infusion as per medicine.  #4: Obstructive sleep apnea/COPD: Now on BiPAP and wean as tolerated.  #5: Anemia: We will continue to monitor closely.  #6: Secondary hyperparathyroidism: We will check PTH, calcium and phosphorus levels.  Continue vitamin D.  #7: Hypertension:  Continue amlodipine and carvedilol.  I advised her on the importance of 2 g salt restricted diet along with 1000 cc fluid restriction. To follow-up with [facility] for further renal care.  LOS: 0 Lyla Son, MD [facility]. 6/10/202312:47 PM

## 2021-06-14 NOTE — Assessment & Plan Note (Addendum)
Patient presented to the ER after she was found unresponsive at home and with blood sugar of 57 that improved following oral supplementation of glucose as well as 250 cc of D10 infusion. Chart review shows that she is on Lantus 30 units twice daily with a NovoLog FlexPen sliding scale coverage.  Patient had taken her insulin prior to coming in, but was unsure if she had eaten before.  Due to worsening renal function, may need adjustment of her insulin dose.  Further hyperglycemia.  Insulin on hold, but in the last 24 hours, CBGs have ranged from the 200s to 400s so we will restart Lantus at lower dose.

## 2021-06-15 DIAGNOSIS — I5032 Chronic diastolic (congestive) heart failure: Secondary | ICD-10-CM | POA: Diagnosis not present

## 2021-06-15 DIAGNOSIS — J9621 Acute and chronic respiratory failure with hypoxia: Secondary | ICD-10-CM

## 2021-06-15 DIAGNOSIS — J9622 Acute and chronic respiratory failure with hypercapnia: Secondary | ICD-10-CM

## 2021-06-15 DIAGNOSIS — E162 Hypoglycemia, unspecified: Secondary | ICD-10-CM | POA: Diagnosis not present

## 2021-06-15 DIAGNOSIS — N184 Chronic kidney disease, stage 4 (severe): Secondary | ICD-10-CM

## 2021-06-15 DIAGNOSIS — E1122 Type 2 diabetes mellitus with diabetic chronic kidney disease: Secondary | ICD-10-CM

## 2021-06-15 DIAGNOSIS — J441 Chronic obstructive pulmonary disease with (acute) exacerbation: Secondary | ICD-10-CM

## 2021-06-15 LAB — RESPIRATORY PANEL BY PCR

## 2021-06-15 LAB — BASIC METABOLIC PANEL
Anion gap: 9 (ref 5–15)
BUN: 59 mg/dL — ABNORMAL HIGH (ref 8–23)
CO2: 24 mmol/L (ref 22–32)
Calcium: 8.8 mg/dL — ABNORMAL LOW (ref 8.9–10.3)
Chloride: 104 mmol/L (ref 98–111)
Creatinine, Ser: 4.03 mg/dL — ABNORMAL HIGH (ref 0.44–1.00)
GFR, Estimated: 12 mL/min — ABNORMAL LOW (ref >60)
Glucose, Bld: 246 mg/dL — ABNORMAL HIGH (ref 70–99)
Potassium: 3.5 mmol/L (ref 3.5–5.1)
Sodium: 137 mmol/L (ref 135–145)

## 2021-06-15 LAB — CBC
HCT: 34.4 % — ABNORMAL LOW (ref 36.0–46.0)
Hemoglobin: 10.7 g/dL — ABNORMAL LOW (ref 12.0–15.0)
MCH: 27.8 pg (ref 26.0–34.0)
MCHC: 31.1 g/dL (ref 30.0–36.0)
MCV: 89.4 fL (ref 80.0–100.0)
Platelets: 228 10*3/uL (ref 150–400)
RBC: 3.85 MIL/uL — ABNORMAL LOW (ref 3.87–5.11)
RDW: 12.6 % (ref 11.5–15.5)
WBC: 6.4 10*3/uL (ref 4.0–10.5)
nRBC: 0 % (ref 0.0–0.2)

## 2021-06-15 LAB — GLUCOSE, CAPILLARY
Glucose-Capillary: 172 mg/dL — ABNORMAL HIGH (ref 70–99)
Glucose-Capillary: 185 mg/dL — ABNORMAL HIGH (ref 70–99)
Glucose-Capillary: 202 mg/dL — ABNORMAL HIGH (ref 70–99)
Glucose-Capillary: 245 mg/dL — ABNORMAL HIGH (ref 70–99)
Glucose-Capillary: 265 mg/dL — ABNORMAL HIGH (ref 70–99)
Glucose-Capillary: 281 mg/dL — ABNORMAL HIGH (ref 70–99)

## 2021-06-15 MED ORDER — BUDESONIDE 0.5 MG/2ML IN SUSP
0.5000 mg | Freq: Two times a day (BID) | RESPIRATORY_TRACT | Status: DC
  Administered 2021-06-15 – 2021-06-17 (×6): 0.5 mg via RESPIRATORY_TRACT
  Filled 2021-06-15 (×7): qty 2

## 2021-06-15 NOTE — Progress Notes (Signed)
[facility] Progress Note  Patient: Emily [name]    OVF:[id number]  DOA: 06/14/2021    Date of Service: the patient was seen and examined on 06/15/2021  Brief hospital course: 61 year old female with past medical history of diabetes mellitus and stage IV-V chronic kidney disease plus obstructive sleep apnea, diastolic heart failure and COPD with chronic respiratory failure on 4 L nasal cannula brought into the emergency room on the morning of 6/10 after being found unresponsive and noted low blood sugars.  Patient received oral glucose by EMS and became more responsive but still lethargic.  In the emergency room, patient found to be hypoxic and felt to be in COPD with an acute exacerbation and put on BiPAP.  Admitted to the hospitalist service.  Assessment and Plan: Assessment and Plan: * Hypoglycemia Patient presented to the ER after she was found unresponsive at home and with blood sugar of 57 that improved following oral supplementation of glucose as well as 250 cc of D10 infusion. Chart review shows that she is on Lantus 30 units twice daily with a NovoLog FlexPen sliding scale coverage.  Patient had taken her insulin prior to coming in, but was unsure if she had eaten before.  Due to worsening renal function, may need adjustment of her insulin dose.  Further hyperglycemia.  Insulin on hold, but in the last 24 hours, CBGs have ranged from the 200s to 400s so we will restart Lantus at lower dose.  Acute on chronic respiratory failure with hypoxia and hypercapnia (HCC) Most likely secondary to acute COPD exacerbation.  Initially quiring BiPAP, but since then has been able to wean down to 4 L nasal cannula.  Still with some mild end expiratory wheezing.  Continue steroids.  COPD with acute exacerbation ([place]) Patient with a history of COPD and chronic respiratory failure who presents for evaluation of mental status changes and noted to have diffuse wheezing on exam. She has a  nonproductive cough and nasal congestion Place patient on scheduled and as needed bronchodilator therapy She received a dose of Solu-Medrol 125 mg IV x1 Place patient on budesonide twice daily Continue oxygen supplementation to maintain pulse oximetry greater than 92%  CKD stage 4 due to type 2 diabetes mellitus ([place]) Patient has chronic kidney disease related to her known diabetes mellitus with a baseline serum creatinine of 3.23.  On admission, creatinine 3.9 and has been worsening.  Nephrology following and may need AV access and consider renal replacement therapy in the near future.  Also being evaluated by transplant as per [facility].  Sleep apnea Continue CPAP at bedtime  Essential hypertension Blood pressure is  Stable Continue amlodipine and carvedilol   Hypothermia-resolved as of 06/15/2021 Most likely related to hypoglycemia Patient placed on Bair hugger and temperature now normalized  Chronic diastolic CHF (congestive heart failure) (HCC) Continue Lasix.  Nephrology following.  We will go ahead and check BNP but appears to be euvolemic  Hyperthyroidism Stable Continue Tapazole  Morbid obesity ([place]) Meets criteria BMI greater than 35 and comorbidities of sleep apnea and hypertension       Body mass index is 37.46 kg/m.        Consultants: Nephrology  Procedures: None  Antimicrobials: None    Code Status: Full code   Subjective: Breathing a little bit easier, still some wheezing  Objective: Vital signs were reviewed and unremarkable. Vitals:   06/15/21 0756 06/15/21 1207  BP: (!) 143/82 (!) 141/86  Pulse: 92 94  Resp: 18 18  Temp:  98.2 F (36.8 C) 98.8 F (37.1 C)  SpO2: 93% 93%   No intake or output data in the 24 hours ending 06/15/21 1532 Filed Weights   06/14/21 0720  Weight: 99 kg   Body mass index is 37.46 kg/m.  Exam:  General: Alert and oriented x2, no acute distress HEENT: Normocephalic, atraumatic, mucous membranes slightly  dry.  Poor dentition Cardiovascular: Regular rate and rhythm, S1-S2 Respiratory: Mild end expiratory wheeze Abdomen: Soft, nontender, nondistended, positive bowel sounds Musculoskeletal: No clubbing or cyanosis, trace pitting edema Skin: No skin breaks, tears or lesions Psychiatry: Appropriate, no evidence of psychoses Neurology: No focal deficits  Data Reviewed: Creatinine at 4.03 with GFR of 12  Disposition:  Status is: Inpatient Remains inpatient appropriate because: Improvement in renal function,    Anticipated discharge date: 6/13   Family Communication: Mother at the bedside DVT Prophylaxis: heparin injection 5,000 Units Start: 06/14/21 1400    Author: Annita Brod ,MD 06/15/2021 3:32 PM  To reach On-call, see care teams to locate the attending and reach out via [email or web address]. Between 7PM-7AM, please contact night-coverage If you still have difficulty reaching the attending provider, please page the [facility] (Director on Call) for [facility] on amion for assistance.

## 2021-06-15 NOTE — Assessment & Plan Note (Addendum)
We will hold Lasix as her renal function is worsening nephrology following

## 2021-06-15 NOTE — Hospital Course (Signed)
61 year old female with past medical history of diabetes mellitus and stage IV-V chronic kidney disease plus obstructive sleep apnea, diastolic heart failure and COPD with chronic respiratory failure on 4 L nasal cannula brought into the emergency room on the morning of 6/10 after being found unresponsive and noted low blood sugars.  Patient received oral glucose by EMS and became more responsive but still lethargic.  In the emergency room, patient found to be hypoxic and felt to be in COPD with an acute exacerbation and put on BiPAP.  Admitted to the hospitalist service.

## 2021-06-15 NOTE — Assessment & Plan Note (Addendum)
Meets criteria BMI greater than 35 and comorbidities of sleep apnea and hypertension.

## 2021-06-15 NOTE — Progress Notes (Signed)
[facility]  PROGRESS NOTE   Subjective:   Events noted.  Patient is now off of BiPAP and is on 4 L nasal cannula.  Objective:  Vital signs: Blood pressure (!) 141/86, pulse 94, temperature 98.8 F (37.1 C), resp. rate 18, height '5\' 4"'$  (1.626 m), weight 99 kg, SpO2 93 %. No intake or output data in the 24 hours ending 06/15/21 1231 Filed Weights   06/14/21 0720  Weight: 99 kg     Physical Exam: General:  No acute distress  Head:  Normocephalic, atraumatic. Moist oral mucosal membranes  Eyes:  Anicteric  Neck:  Supple  Lungs:   Clear to auscultation, normal effort  Heart:  S1S2 no rubs  Abdomen:   Soft, nontender, bowel sounds present  Extremities: 2+ peripheral edema.  Neurologic:  Awake, alert, following commands  Skin:  No lesions  Access:     Basic Metabolic Panel: Recent Labs  Lab 06/14/21 0727 06/15/21 0524  NA 137 137  K 4.2 3.5  CL 106 104  CO2 24 24  GLUCOSE 343* 246*  BUN 49* 59*  CREATININE 3.90* 4.03*  CALCIUM 9.3 8.8*    CBC: Recent Labs  Lab 06/14/21 0727 06/15/21 0524  WBC 8.6 6.4  HGB 11.5* 10.7*  HCT 39.3 34.4*  MCV 92.7 89.4  PLT 223 228     Urinalysis: No results for input(s): "COLORURINE", "LABSPEC", "PHURINE", "GLUCOSEU", "HGBUR", "BILIRUBINUR", "KETONESUR", "PROTEINUR", "UROBILINOGEN", "NITRITE", "LEUKOCYTESUR" in the last 72 hours.  Invalid input(s): "APPERANCEUR"    Imaging: CT CHEST WO CONTRAST  Result Date: 06/14/2021 CLINICAL DATA:  Altered mental status, COPD EXAM: CT CHEST WITHOUT CONTRAST TECHNIQUE: Multidetector CT imaging of the chest was performed following the standard protocol without IV contrast. RADIATION DOSE REDUCTION: This exam was performed according to the departmental dose-optimization program which includes automated exposure control, adjustment of the mA and/or kV according to patient size and/or use of iterative reconstruction technique. COMPARISON:  02/15/2018 FINDINGS: Cardiovascular:  Heart size upper limits normal. Trace pericardial fluid. Mild coronary calcifications. Scattered aortic calcifications. Mediastinum/Nodes: Enlarged heterogenous thyroid, which has been recently evaluated with ultrasound. No mediastinal hematoma. 1 cm pre-vascular lymph node (Im42,Se3) , previously 9 mm. 11 mm high right paratracheal node previously 9 mm image 37. No hilar adenopathy, sensitivity decreased in the absence of IV contrast. Lungs/Pleura: No pleural effusion. New linear subsegmental atelectasis in anterior left upper lobe. Interval resolution of previously noted right lower lobe airspace disease. Upper Abdomen: Cholecystectomy clips.  No acute findings. Musculoskeletal: DISH across multiple levels in the mid and lower thoracic spine. IMPRESSION: 1. Anterior left upper lobe linear atelectasis or scarring. 2. Borderline mediastinal lymph node enlargement, possibly reactive but nonspecific. 3. Coronary and Aortic Atherosclerosis (ICD10-170.0). Electronically Signed   By: Lucrezia Europe M.D.   On: 06/14/2021 10:00   DG Chest Port 1 View  Result Date: 06/14/2021 CLINICAL DATA:  Shortness of breath EXAM: PORTABLE CHEST 1 VIEW COMPARISON:  Radiograph 04/25/2019, CT 02/15/2018 FINDINGS: Unchanged enlarged cardiac silhouette. Mild pulmonary vascular congestion. There are streaky left mid to lower lung opacities. No large pleural effusion. No pneumothorax. No acute osseous abnormality. IMPRESSION: Cardiomegaly with mild pulmonary vascular congestion. Streaky left mid to lower lung opacities favored to be atelectasis/scarring. Electronically Signed   By: Maurine Simmering M.D.   On: 06/14/2021 08:13     Medications:    sodium chloride      amLODipine  5 mg Oral Daily   aspirin EC  81 mg Oral Daily  atorvastatin  40 mg Oral QHS   budesonide (PULMICORT) nebulizer solution  0.5 mg Nebulization Q12H   carvedilol  25 mg Oral BID   furosemide  40 mg Intravenous Q12H   heparin injection (subcutaneous)  5,000 Units  Subcutaneous Q8H   insulin aspart  0-9 Units Subcutaneous Q4H   ipratropium-albuterol  3 mL Nebulization Q6H   methimazole  5 mg Oral Daily   modafinil  100 mg Oral Daily   pantoprazole  40 mg Oral Daily   sodium chloride flush  3 mL Intravenous Q12H   Vitamin D (Ergocalciferol)  50,000 Units Oral Q7 days    Assessment/ Plan:     Principal Problem:   Hypoglycemia Active Problems:   Essential hypertension   Hyperthyroidism   COPD with acute exacerbation (HCC)   Sleep apnea   CKD stage 4 due to type 2 diabetes mellitus ([place])   Acute on chronic respiratory failure with hypoxia and hypercapnia ([place])   Hypothermia  Patient is a 61 y.o. 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  She is found to be hypoglycemic and has received D50.  Patient is presently on BiPAP secondary to shortness of breath.  Chest x-ray shows mild PVC.   #1: Acute kidney injury on CKD: Patient has CKD stage IV with anemia and proteinuria most likely secondary to chronic diabetic kidney disease.  Patient now has acute kidney injury on the top of chronic kidney disease most likely secondary to the acute respiratory process.  Patient may need AV access and possibly start renal replacement therapy in the near future.  Transplant evaluation as per [facility].   #2: Shortness of breath/CHF: We will continue the IV furosemide at 40 mg twice a daily.  2 g salt diet and fluid restriction advised.   #3: Hypoglycemia: Now improved.     #4: Obstructive sleep apnea/COPD: Off of BiPAP now.   #5: Anemia: We will continue to monitor closely.   #6: Secondary hyperparathyroidism: We will check PTH, calcium and phosphorus levels.  Continue vitamin D.   #7: Hypertension: Continue amlodipine and carvedilol.    I advised her on the importance of 2 g salt restricted diet along with 1000 cc fluid restriction. To follow-up with [facility] for further renal care.   LOS: [place], MD [facility] 6/11/202312:31 PM

## 2021-06-16 DIAGNOSIS — J9621 Acute and chronic respiratory failure with hypoxia: Secondary | ICD-10-CM | POA: Diagnosis not present

## 2021-06-16 DIAGNOSIS — G4733 Obstructive sleep apnea (adult) (pediatric): Secondary | ICD-10-CM

## 2021-06-16 DIAGNOSIS — E162 Hypoglycemia, unspecified: Secondary | ICD-10-CM | POA: Diagnosis not present

## 2021-06-16 DIAGNOSIS — J441 Chronic obstructive pulmonary disease with (acute) exacerbation: Secondary | ICD-10-CM | POA: Diagnosis not present

## 2021-06-16 DIAGNOSIS — E1122 Type 2 diabetes mellitus with diabetic chronic kidney disease: Secondary | ICD-10-CM | POA: Diagnosis not present

## 2021-06-16 LAB — BASIC METABOLIC PANEL
Anion gap: 9 (ref 5–15)
Anion gap: 10 (ref 5–15)
BUN: 81 mg/dL — ABNORMAL HIGH (ref 8–23)
BUN: 88 mg/dL — ABNORMAL HIGH (ref 8–23)
CO2: 25 mmol/L (ref 22–32)
CO2: 22 mmol/L (ref 22–32)
Calcium: 8.5 mg/dL — ABNORMAL LOW (ref 8.9–10.3)
Calcium: 8.6 mg/dL — ABNORMAL LOW (ref 8.9–10.3)
Chloride: 104 mmol/L (ref 98–111)
Chloride: 104 mmol/L (ref 98–111)
Creatinine, Ser: 4.55 mg/dL — ABNORMAL HIGH (ref 0.44–1.00)
Creatinine, Ser: 4.69 mg/dL — ABNORMAL HIGH (ref 0.44–1.00)
GFR, Estimated: 10 mL/min — ABNORMAL LOW (ref >60)
GFR, Estimated: 10 mL/min — ABNORMAL LOW (ref >60)
Glucose, Bld: 198 mg/dL — ABNORMAL HIGH (ref 70–99)
Glucose, Bld: 243 mg/dL — ABNORMAL HIGH (ref 70–99)
Potassium: 2.9 mmol/L — ABNORMAL LOW (ref 3.5–5.1)
Potassium: 3.8 mmol/L (ref 3.5–5.1)
Sodium: 138 mmol/L (ref 135–145)
Sodium: 136 mmol/L (ref 135–145)

## 2021-06-16 LAB — GLUCOSE, CAPILLARY
Glucose-Capillary: 131 mg/dL — ABNORMAL HIGH (ref 70–99)
Glucose-Capillary: 188 mg/dL — ABNORMAL HIGH (ref 70–99)
Glucose-Capillary: 189 mg/dL — ABNORMAL HIGH (ref 70–99)
Glucose-Capillary: 240 mg/dL — ABNORMAL HIGH (ref 70–99)
Glucose-Capillary: 215 mg/dL — ABNORMAL HIGH (ref 70–99)
Glucose-Capillary: 155 mg/dL — ABNORMAL HIGH (ref 70–99)
Glucose-Capillary: 156 mg/dL — ABNORMAL HIGH (ref 70–99)

## 2021-06-16 LAB — BRAIN NATRIURETIC PEPTIDE: B Natriuretic Peptide: 9.6 pg/mL (ref 0.0–100.0)

## 2021-06-16 LAB — MAGNESIUM: Magnesium: 1.9 mg/dL (ref 1.7–2.4)

## 2021-06-16 MED ORDER — IPRATROPIUM-ALBUTEROL 0.5-2.5 (3) MG/3ML IN SOLN
3.0000 mL | Freq: Three times a day (TID) | RESPIRATORY_TRACT | Status: DC
  Administered 2021-06-16 – 2021-06-17 (×3): 3 mL via RESPIRATORY_TRACT
  Filled 2021-06-16 (×4): qty 3

## 2021-06-16 MED ORDER — MAGNESIUM SULFATE 2 GM/50ML IV SOLN
2.0000 g | Freq: Once | INTRAVENOUS | Status: AC
  Administered 2021-06-16: 2 g via INTRAVENOUS
  Filled 2021-06-16: qty 50

## 2021-06-16 MED ORDER — POTASSIUM CHLORIDE CRYS ER 20 MEQ PO TBCR
40.0000 meq | EXTENDED_RELEASE_TABLET | Freq: Once | ORAL | Status: DC
  Filled 2021-06-16: qty 2

## 2021-06-16 MED ORDER — POTASSIUM CHLORIDE CRYS ER 20 MEQ PO TBCR
40.0000 meq | EXTENDED_RELEASE_TABLET | ORAL | Status: AC
  Administered 2021-06-16 (×2): 40 meq via ORAL
  Filled 2021-06-16: qty 2

## 2021-06-16 MED ORDER — INSULIN GLARGINE-YFGN 100 UNIT/ML ~~LOC~~ SOLN
20.0000 [IU] | Freq: Every day | SUBCUTANEOUS | Status: DC
  Administered 2021-06-16 – 2021-06-18 (×3): 20 [IU] via SUBCUTANEOUS
  Filled 2021-06-16 (×3): qty 0.2

## 2021-06-16 NOTE — Progress Notes (Signed)
Progress Note   Patient: Emily [name] KVQ:[id number] DOB: 11/05/1960 DOA: 06/14/2021     2 DOS: the patient was seen and examined on 06/16/2021   Brief hospital course: 61 year old female with past medical history of diabetes mellitus and stage IV-V chronic kidney disease plus obstructive sleep apnea, diastolic heart failure and COPD with chronic respiratory failure on 4 L nasal cannula brought into the emergency room on the morning of 6/10 after being found unresponsive and noted low blood sugars.  Patient received oral glucose by EMS and became more responsive but still lethargic.  Admitted for COPD exacerbation and hypoxia.  Also found to be hypoglycemic  6/12: Potassium 2.9, on 4 L oxygen via nasal cannula   Assessment and Plan: * Hypoglycemia Patient presented to the ER after she was found unresponsive at home and with blood sugar of 57 that improved following oral supplementation of glucose as well as 250 cc of D10 infusion. Chart review shows that she is on Lantus 30 units twice daily with a NovoLog FlexPen sliding scale coverage.  Patient had taken her insulin prior to coming in, but was unsure if she had eaten before.  Blood sugars creeping up so we will restart insulin Lantus/Semglee at 20 units daily for now  Acute on chronic respiratory failure with hypoxia and hypercapnia (HCC) Most likely secondary to acute COPD exacerbation.  Initially quiring BiPAP, but since then has been able to wean down to 4 L nasal cannula which is her baseline oxygen requirement. Continue steroids.  COPD with acute exacerbation ([place]) Patient with a history of COPD and chronic respiratory failure who presents for evaluation of mental status changes and noted to have diffuse wheezing on exam. She has a nonproductive cough and nasal congestion Place patient on scheduled and as needed bronchodilator therapy She received a dose of Solu-Medrol 125 mg IV x1 Continue budesonide twice daily Continue oxygen  supplementation to maintain pulse oximetry greater than 92%  CKD stage 4 due to type 2 diabetes mellitus ([place]) Patient has chronic kidney disease related to her known diabetes mellitus with a baseline serum creatinine of 3.23.  Lab Results  Component Value Date   CREATININE 4.55 (H) 06/16/2021   CREATININE 4.03 (H) 06/15/2021   CREATININE 3.90 (H) 06/14/2021   Her kidney function is worsening.  Nephrology following and may need AV access and consider renal replacement therapy in the near future.  Also being evaluated by transplant as per [facility].  Sleep apnea Continue CPAP at bedtime.  Essential hypertension Blood pressure is  Stable Continue amlodipine and carvedilol.   Hypothermia-resolved as of 06/15/2021 Most likely related to hypoglycemia Patient placed on Bair hugger and temperature now normalized  Chronic diastolic CHF (congestive heart failure) ([place]) We will hold Lasix as her renal function is worsening nephrology following  Hyperthyroidism Continue Tapazole  Morbid obesity ([place]) Meets criteria BMI greater than 35 and comorbidities of sleep apnea and hypertension.        Subjective: No new complaints.  Kidney function continues to get worse.  Potassium 2.9  Physical Exam: Vitals:   06/16/21 0250 06/16/21 0340 06/16/21 0916 06/16/21 1156  BP:  137/77 (!) 146/76 (!) 141/73  Pulse: 89 87 86 89  Resp: (!) 25 (!) '21 18 17  '$ Temp:  98 F (36.7 C) 98 F (36.7 C) 98.2 F (36.8 C)  TempSrc:      SpO2: 96% 95% 93% 94%  Weight:      Height:       . General:  Alert and oriented x2, no acute distress . HEENT: Normocephalic, atraumatic, mucous membranes slightly dry.  Poor dentition . Cardiovascular: Regular rate and rhythm, S1-S2 . Respiratory:  Clear to auscultation bilaterally . Abdomen: Soft, nontender, nondistended, positive bowel sounds . Musculoskeletal: No clubbing or cyanosis, trace pitting edema . Skin: No skin breaks, tears or lesions . Psychiatry:  Appropriate, no evidence of psychoses . Neurology: No focal deficits .  Data Reviewed:  Potassium 2.9, creatinine 4.55  Family Communication: None  Disposition: Status is: Inpatient Remains inpatient appropriate because: Worsening kidney function and severe hypokalemia.    Planned Discharge Destination: Home    DVT prophylaxis-subcu heparin Time spent: 35 minutes  Author: Max Sane, MD 06/16/2021 2:23 PM  For on call review [email or web address].

## 2021-06-16 NOTE — TOC Initial Note (Signed)
Transition of Care [facility]) - Initial/Assessment Note    Patient Details  Name: Emily [name] MRN: [id number] Date of Birth: 05/23/1960  Transition of Care [facility]) CM/SW Contact:    Laurena Slimmer, RN Phone Number: 06/16/2021, 4:37 PM  Clinical Narrative:                  Transition of Care [facility]) Screening Note   Patient Details  Name: Emily [name] Date of Birth: 1960/08/26   Transition of Care [facility]) CM/SW Contact:    Laurena Slimmer, RN Phone Number: 06/16/2021, 4:37 PM    Transition of Care Department [facility]) has reviewed patient and no TOC needs have been identified at this time. We will continue to monitor patient advancement through interdisciplinary progression rounds. If new patient transition needs arise, please place a TOC consult.          Patient Goals and CMS Choice        Expected Discharge Plan and Services                                                Prior Living Arrangements/Services                       Activities of Daily Living Home Assistive Devices/Equipment: Oxygen, CPAP, CBG Meter ADL Screening (condition at time of admission) Patient's cognitive ability adequate to safely complete daily activities?: No Is the patient deaf or have difficulty hearing?: No Does the patient have difficulty seeing, even when wearing glasses/contacts?: No Does the patient have difficulty concentrating, remembering, or making decisions?: No Patient able to express need for assistance with ADLs?: No Does the patient have difficulty dressing or bathing?: No Independently performs ADLs?: Yes (appropriate for developmental age) Does the patient have difficulty walking or climbing stairs?: No Weakness of Legs: None Weakness of Arms/Hands: None  Permission Sought/Granted                  Emotional Assessment              Admission diagnosis:  Hypoglycemia [E16.2] Hypothermia, initial encounter [T68.XXXA] Acute on  chronic respiratory failure with hypoxia and hypercapnia (HCC) [J96.21, J96.22] Patient Active Problem List   Diagnosis Date Noted   Morbid obesity ([place]) 06/15/2021   Hypoglycemia 06/14/2021   CKD stage 4 due to type 2 diabetes mellitus ([place]) 06/14/2021   Acute on chronic respiratory failure with hypoxia and hypercapnia ([place]) 06/14/2021   Lymphedema 07/10/2019   Pain due to onychomycosis of toenails of both feet 03/09/2019   Type 2 diabetes mellitus with vascular disease ([place]) 03/09/2019   Chronic venous insufficiency 02/12/2019   Hyperlipidemia 02/12/2019   COPD with acute exacerbation ([place]) 02/21/2018   Sleep apnea 02/21/2018   Chronic diastolic CHF (congestive heart failure) ([place]) 02/12/2018   Puerperal sepsis with acute hypoxic respiratory failure ([place]) 11/12/2017   Leg pain, bilateral 12/17/2016   Thyroid nodule 04/30/2016   Left sided numbness 04/27/2016   Muscle spasm of back 11/05/2015   Hyperthyroidism 03/13/2015   Goiter diffuse 03/13/2015   Poorly controlled type 2 diabetes mellitus with circulatory disorder ([place]) 02/07/2015   Essential hypertension 12/27/2014   Chronic kidney disease 12/27/2014   Tobacco abuse 12/27/2014   PCP:  [facility] [place]:   [facility] (N), [place] - [address] [place] ([place]) [place] [address] Phone: [phone] Fax: [phone]     Social Determinants of Health (SDOH) Interventions    Readmission Risk Interventions     No data to display

## 2021-06-16 NOTE — Plan of Care (Signed)
  Problem: Education: Goal: Knowledge of disease or condition will improve Outcome: Progressing   Problem: Activity: Goal: Ability to tolerate increased activity will improve Outcome: Progressing   Problem: Respiratory: Goal: Ability to maintain a clear airway will improve Outcome: Progressing   Problem: Nutrition: Goal: Adequate nutrition will be maintained Outcome: Progressing   Problem: Pain Managment: Goal: General experience of comfort will improve Outcome: Progressing   Problem: Safety: Goal: Ability to remain free from injury will improve Outcome: Progressing   Problem: Skin Integrity: Goal: Risk for impaired skin integrity will decrease Outcome: Progressing

## 2021-06-16 NOTE — Consult Note (Signed)
PHARMACY CONSULT NOTE - FOLLOW UP  Pharmacy Consult for Electrolyte Monitoring and Replacement   Recent Labs: Potassium (mmol/L)  Date Value  06/16/2021 2.9 (L)  06/02/2013 3.5   Magnesium (mg/dL)  Date Value  06/16/2021 1.9   Calcium (mg/dL)  Date Value  06/16/2021 8.5 (L)   Calcium, Total (mg/dL)  Date Value  06/02/2013 9.5   Albumin (g/dL)  Date Value  06/14/2021 2.9 (L)  05/14/2016 3.6  06/02/2013 3.0 (L)   Sodium (mmol/L)  Date Value  06/16/2021 138  05/14/2016 142  06/02/2013 140     Assessment: 61 yo F w/ h/o DM, CKD4-5, diaCHF, COPD, HTN, & OSA admitted for hypoglycemia & COPDE. Pharmacy consulted for mgmt of electrolytes  Goal of Therapy:  [place] WNL. K >4, Mg >2  Plan:  Patient continues on lasix IV '40mg'$  BID (6/10>> At time of consult, K+ 3.5>2.9 (delta of 0.6-0.7 w/ each day). Will replete initially with KCL PO 82mq q2h x2 doses. Mg 1.9: slightly below goal. Will replete with MgSO4 IV 2g x1 CTM and replace PRN with daily AM labs.  BLorna Dibble,PharmD Clinical Pharmacist 06/16/2021 10:03 AM

## 2021-06-16 NOTE — Progress Notes (Addendum)
[facility]  PROGRESS NOTE   Subjective:   Patient seen sitting up in bed Alert and oriented States she feels better than on admission. Tolerating meals without nausea and vomiting  Creatinine 4.55 Urine output 1.5L   Objective:  Vital signs: Blood pressure (!) 141/73, pulse 89, temperature 98.2 F (36.8 C), resp. rate 17, height '5\' 4"'$  (1.626 m), weight 99 kg, SpO2 94 %.  Intake/Output Summary (Last 24 hours) at 06/16/2021 1536 Last data filed at 06/16/2021 0500 Gross per 24 hour  Intake --  Output 1500 ml  Net -1500 ml   Filed Weights   06/14/21 0720  Weight: 99 kg     Physical Exam: General:  No acute distress  Head:  Normocephalic, atraumatic. Moist oral mucosal membranes  Eyes:  Anicteric  Lungs:   Clear to auscultation, normal effort  Heart:  S1S2 no rubs  Abdomen:   Soft, nontender, bowel sounds present  Extremities: 1+ peripheral edema.  Neurologic:  Awake, alert, following commands  Skin:  No lesions  Access: none    Basic Metabolic Panel: Recent Labs  Lab 06/14/21 0727 06/15/21 0524 06/16/21 0512 06/16/21 1426  NA 137 137 138 136  K 4.2 3.5 2.9* 3.8  CL 106 104 104 104  CO2 '24 24 25 22  '$ GLUCOSE 343* 246* 198* 243*  BUN 49* 59* 81* 88*  CREATININE 3.90* 4.03* 4.55* 4.69*  CALCIUM 9.3 8.8* 8.5* 8.6*  MG  --   --  1.9  --      CBC: Recent Labs  Lab 06/14/21 0727 06/15/21 0524  WBC 8.6 6.4  HGB 11.5* 10.7*  HCT 39.3 34.4*  MCV 92.7 89.4  PLT 223 228      Urinalysis: No results for input(s): "COLORURINE", "LABSPEC", "PHURINE", "GLUCOSEU", "HGBUR", "BILIRUBINUR", "KETONESUR", "PROTEINUR", "UROBILINOGEN", "NITRITE", "LEUKOCYTESUR" in the last 72 hours.  Invalid input(s): "APPERANCEUR"    Imaging: No results found.   Medications:    sodium chloride      amLODipine  5 mg Oral Daily   aspirin EC  81 mg Oral Daily   atorvastatin  40 mg Oral QHS   budesonide (PULMICORT) nebulizer solution  0.5 mg Nebulization Q12H    carvedilol  25 mg Oral BID   heparin injection (subcutaneous)  5,000 Units Subcutaneous Q8H   insulin aspart  0-9 Units Subcutaneous Q4H   insulin glargine-yfgn  20 Units Subcutaneous Daily   ipratropium-albuterol  3 mL Nebulization Q6H   methimazole  5 mg Oral Daily   modafinil  100 mg Oral Daily   pantoprazole  40 mg Oral Daily   sodium chloride flush  3 mL Intravenous Q12H   Vitamin D (Ergocalciferol)  50,000 Units Oral Q7 days    Assessment/ Plan:     Principal Problem:   Hypoglycemia Active Problems:   Essential hypertension   Hyperthyroidism   Chronic diastolic CHF (congestive heart failure) (HCC)   COPD with acute exacerbation (HCC)   Sleep apnea   CKD stage 4 due to type 2 diabetes mellitus (HCC)   Acute on chronic respiratory failure with hypoxia and hypercapnia (HCC)   Morbid obesity ([place])  Patient is a 61 y.o. 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     #1: Acute kidney injury on CKD stage IV. Renal function continues to decline, will hold furosemide.  Tolerating meals. Will continue to evaluate. No immediate need for dialysis. Monitoring closely and patient has scheduled appt with [facility] nephrology next week.    #2: Shortness of breath/CHF: Will hold furosemide as volume status is acceptable at present.   #3: Hypoglycemia: resolved   #4: Obstructive sleep apnea/COPD: Placed on baseline 4L [place]   #5: Anemia with chronic kidney disease: We will continue to monitor closely.   #6: Secondary hyperparathyroidism: We will check PTH, calcium and phosphorus levels.  Continue vitamin D.   #7: Hypertension: Continue amlodipine and carvedilol.     LOS: 2 [facility] 6/12/20233:36 PM   Patient was  examined and evaluated with Colon Flattery, NP.  Plan of care was formulated and discussed with patient as well as NP.  I agree with the note as documented with edits

## 2021-06-17 LAB — BASIC METABOLIC PANEL
Anion gap: 6 (ref 5–15)
BUN: 94 mg/dL — ABNORMAL HIGH (ref 8–23)
CO2: 23 mmol/L (ref 22–32)
Calcium: 8.6 mg/dL — ABNORMAL LOW (ref 8.9–10.3)
Chloride: 107 mmol/L (ref 98–111)
Creatinine, Ser: 4.83 mg/dL — ABNORMAL HIGH (ref 0.44–1.00)
GFR, Estimated: 10 mL/min — ABNORMAL LOW (ref >60)
Glucose, Bld: 144 mg/dL — ABNORMAL HIGH (ref 70–99)
Potassium: 3.7 mmol/L (ref 3.5–5.1)
Sodium: 136 mmol/L (ref 135–145)

## 2021-06-17 LAB — CBC
HCT: 33.1 % — ABNORMAL LOW (ref 36.0–46.0)
Hemoglobin: 10.1 g/dL — ABNORMAL LOW (ref 12.0–15.0)
MCH: 27.2 pg (ref 26.0–34.0)
MCHC: 30.5 g/dL (ref 30.0–36.0)
MCV: 89.2 fL (ref 80.0–100.0)
Platelets: 241 10*3/uL (ref 150–400)
RBC: 3.71 MIL/uL — ABNORMAL LOW (ref 3.87–5.11)
RDW: 12.8 % (ref 11.5–15.5)
WBC: 10.2 10*3/uL (ref 4.0–10.5)
nRBC: 0.2 % (ref 0.0–0.2)

## 2021-06-17 LAB — GLUCOSE, CAPILLARY
Glucose-Capillary: 146 mg/dL — ABNORMAL HIGH (ref 70–99)
Glucose-Capillary: 150 mg/dL — ABNORMAL HIGH (ref 70–99)
Glucose-Capillary: 186 mg/dL — ABNORMAL HIGH (ref 70–99)
Glucose-Capillary: 193 mg/dL — ABNORMAL HIGH (ref 70–99)
Glucose-Capillary: 197 mg/dL — ABNORMAL HIGH (ref 70–99)
Glucose-Capillary: 207 mg/dL — ABNORMAL HIGH (ref 70–99)

## 2021-06-17 LAB — MAGNESIUM: Magnesium: 2.2 mg/dL (ref 1.7–2.4)

## 2021-06-17 LAB — PHOSPHORUS: Phosphorus: 4.7 mg/dL — ABNORMAL HIGH (ref 2.5–4.6)

## 2021-06-17 MED ORDER — POTASSIUM CHLORIDE CRYS ER 20 MEQ PO TBCR
20.0000 meq | EXTENDED_RELEASE_TABLET | Freq: Once | ORAL | Status: AC
Start: 2021-06-17 — End: 2021-06-17
  Administered 2021-06-17: 20 meq via ORAL
  Filled 2021-06-17: qty 1

## 2021-06-17 NOTE — Assessment & Plan Note (Signed)
Patient with a history of COPD and chronic respiratory failure who presents for evaluation of mental status changes  She has a nonproductive cough and nasal congestion Continue scheduled and as needed bronchodilator therapy Continue budesonide twice daily Continue oxygen supplementation to maintain pulse oximetry greater than 92%-currently on 4 L oxygen which is her baseline

## 2021-06-17 NOTE — Assessment & Plan Note (Signed)
Well compensated at this time.  Lasix on hold as renal function worsening

## 2021-06-17 NOTE — Assessment & Plan Note (Signed)
Most likely secondary to acute COPD exacerbation.  Initially quiring BiPAP, but since then has been able to wean down to 4 L nasal cannula which is her baseline oxygen requirement.

## 2021-06-17 NOTE — Progress Notes (Addendum)
[facility]  PROGRESS NOTE   Subjective:   Patient sitting up in bed Recently ambulated from bathroom Continues to tolerated meals, denies nausea and vomiting Denies shortness of breath  Creatinine 4.83 Urine output 423m   Objective:  Vital signs: Blood pressure 126/77, pulse 89, temperature 98.7 F (37.1 C), resp. rate 18, height '5\' 4"'$  (1.626 m), weight 99 kg, SpO2 96 %.  Intake/Output Summary (Last 24 hours) at 06/17/2021 1214 Last data filed at 06/17/2021 0359 Gross per 24 hour  Intake 50 ml  Output 0 ml  Net 50 ml    Filed Weights   06/14/21 0720  Weight: 99 kg     Physical Exam: General:  No acute distress  Head:  Normocephalic, atraumatic. Moist oral mucosal membranes  Eyes:  Anicteric  Lungs:   Clear to auscultation, normal effort  Heart:  S1S2 no rubs  Abdomen:   Soft, nontender, bowel sounds present  Extremities: trace peripheral edema.  Neurologic:  Awake, alert, following commands  Skin:  No lesions  Access: none    Basic Metabolic Panel: Recent Labs  Lab 06/14/21 0727 06/15/21 0524 06/16/21 0512 06/16/21 1426 06/17/21 0350  NA 137 137 138 136 136  K 4.2 3.5 2.9* 3.8 3.7  CL 106 104 104 104 107  CO2 '24 24 25 22 23  '$ GLUCOSE 343* 246* 198* 243* 144*  BUN 49* 59* 81* 88* 94*  CREATININE 3.90* 4.03* 4.55* 4.69* 4.83*  CALCIUM 9.3 8.8* 8.5* 8.6* 8.6*  MG  --   --  1.9  --  2.2  PHOS  --   --   --   --  4.7*     CBC: Recent Labs  Lab 06/14/21 0727 06/15/21 0524 06/17/21 0350  WBC 8.6 6.4 10.2  HGB 11.5* 10.7* 10.1*  HCT 39.3 34.4* 33.1*  MCV 92.7 89.4 89.2  PLT 223 228 241      Urinalysis: No results for input(s): "COLORURINE", "LABSPEC", "PHURINE", "GLUCOSEU", "HGBUR", "BILIRUBINUR", "KETONESUR", "PROTEINUR", "UROBILINOGEN", "NITRITE", "LEUKOCYTESUR" in the last 72 hours.  Invalid input(s): "APPERANCEUR"    Imaging: No results found.   Medications:    sodium chloride      amLODipine  5 mg Oral Daily    aspirin EC  81 mg Oral Daily   atorvastatin  40 mg Oral QHS   budesonide (PULMICORT) nebulizer solution  0.5 mg Nebulization Q12H   carvedilol  25 mg Oral BID   heparin injection (subcutaneous)  5,000 Units Subcutaneous Q8H   insulin aspart  0-9 Units Subcutaneous Q4H   insulin glargine-yfgn  20 Units Subcutaneous Daily   ipratropium-albuterol  3 mL Nebulization TID   methimazole  5 mg Oral Daily   modafinil  100 mg Oral Daily   pantoprazole  40 mg Oral Daily   sodium chloride flush  3 mL Intravenous Q12H   Vitamin D (Ergocalciferol)  50,000 Units Oral Q7 days    Assessment/ Plan:     Principal Problem:   Hypoglycemia Active Problems:   Essential hypertension   Hyperthyroidism   Chronic diastolic CHF (congestive heart failure) (HCC)   COPD with acute exacerbation (HCC)   Sleep apnea   CKD stage 4 due to type 2 diabetes mellitus (HCC)   Acute on chronic respiratory failure with hypoxia and hypercapnia (HCC)   Morbid obesity ([place]  Patient is a 61y.o. female with a PMHx of diabetes, peripheral vascular disease, obesity, obstructive sleep apnea on CPAP, COPD, hypertension, congestive heart failure, chronic kidney disease with  nephrotic range proteinuria and anemia now admitted with history of unresponsiveness this morning.     #1: Acute kidney injury on CKD stage IV. Renal function continues to decline, Furosemide held. Creatinine continues to increase with BUN elevated also. Urine output adequate. Would prefer renal function to start improving prior to discharge. No acute need for dialysis unless renal function continues to decline. Will monitor for uremic symptoms.    #2: Shortness of breath/CHF: Furosemide remains held   #3: Hypoglycemia: resolved   #4: Obstructive sleep apnea/COPD: Remains on baseline 4L [place]   #5: Anemia with chronic kidney disease: Hgb at goal   #6: Secondary hyperparathyroidism: Calcium and phosphorus levels at goal. Will check PTH.  Continue vitamin D.    #7: Hypertension: Continue amlodipine and carvedilol.     LOS: [facility] 6/13/202312:14 PM

## 2021-06-17 NOTE — Progress Notes (Signed)
PHARMACY CONSULT NOTE - FOLLOW UP  Pharmacy Consult for Electrolyte Monitoring and Replacement   Recent Labs: Potassium (mmol/L)  Date Value  06/17/2021 3.7  06/02/2013 3.5   Magnesium (mg/dL)  Date Value  06/17/2021 2.2   Calcium (mg/dL)  Date Value  06/17/2021 8.6 (L)   Calcium, Total (mg/dL)  Date Value  06/02/2013 9.5   Albumin (g/dL)  Date Value  06/14/2021 2.9 (L)  05/14/2016 3.6  06/02/2013 3.0 (L)   Phosphorus (mg/dL)  Date Value  06/17/2021 4.7 (H)   Sodium (mmol/L)  Date Value  06/17/2021 136  05/14/2016 142  06/02/2013 140     Assessment: 61 yo F w/ h/o DM, CKD4-5, diaCHF, COPD, HTN, & OSA admitted for hypoglycemia & COPDE. Pharmacy consulted for mgmt of electrolytes  Goal of Therapy:  Electrolytes WNL. K >4, Mg >2  Plan:  K 3.7 today, improved but remains under desired K =/> 4. Furosemide discontinued. Will order Kcl 52mq x 1 dose today.  Mg 2.2, no additional replacement at this time Will follow up AM labs and replace electrolytes as needed  Jeffry Vogelsang Rodriguez-Guzman PharmD, BCPS 06/17/2021 9:13 AM

## 2021-06-17 NOTE — Assessment & Plan Note (Signed)
Meets criteria BMI greater than 35 and comorbidities of sleep apnea and hypertension

## 2021-06-17 NOTE — Plan of Care (Signed)
  Problem: Education: Goal: Knowledge of disease or condition will improve Outcome: Progressing   Problem: Respiratory: Goal: Ability to maintain a clear airway will improve Outcome: Progressing   Problem: Pain Managment: Goal: General experience of comfort will improve Outcome: Progressing   Problem: Safety: Goal: Ability to remain free from injury will improve Outcome: Progressing   Problem: Skin Integrity: Goal: Risk for impaired skin integrity will decrease Outcome: Progressing

## 2021-06-17 NOTE — Assessment & Plan Note (Signed)
- 

## 2021-06-17 NOTE — Assessment & Plan Note (Signed)
Patient presented to the ER after she was found unresponsive at home and with blood sugar of 57 that improved following oral supplementation of glucose as well as 250 cc of D10 infusion. Chart review shows that she is on Lantus 30 units twice daily with a NovoLog FlexPen sliding scale coverage.  Patient had taken her insulin prior to coming in, but was unsure if she had eaten before.  Blood sugars creeping up so restarted insulin Lantus/Semglee at 20 units daily for now.  Adjust as needed  Hemoglobin A1c 7.2

## 2021-06-17 NOTE — Plan of Care (Signed)
  Problem: Education: Goal: Knowledge of disease or condition will improve Outcome: Progressing   Problem: Activity: Goal: Ability to tolerate increased activity will improve Outcome: Progressing Goal: Will verbalize the importance of balancing activity with adequate rest periods Outcome: Progressing   

## 2021-06-17 NOTE — Assessment & Plan Note (Signed)
Continue CPAP at bedtime 

## 2021-06-17 NOTE — Assessment & Plan Note (Signed)
Patient has chronic kidney disease related to her known diabetes mellitus with a baseline serum creatinine of 3.23.  Lab Results  Component Value Date   CREATININE 4.83 (H) 06/17/2021   CREATININE 4.69 (H) 06/16/2021   CREATININE 4.55 (H) 06/16/2021   Her kidney function is worsening.  Nephrology following and may need AV access and consider renal replacement therapy in the near future.  Also being evaluated by transplant as per [facility].  Lasix on hold

## 2021-06-17 NOTE — Progress Notes (Signed)
Progress Note   Patient: Emily [name] ALP:[id number] DOB: September 17, 1960 DOA: 06/14/2021     3 DOS: the patient was seen and examined on 06/17/2021   Brief hospital course: 61 year old female with past medical history of diabetes mellitus and stage IV-V chronic kidney disease plus obstructive sleep apnea, diastolic heart failure and COPD with chronic respiratory failure on 4 L nasal cannula brought into the emergency room on the morning of 6/10 after being found unresponsive and noted low blood sugars.  Patient received oral glucose by EMS and became more responsive but still lethargic.  Admitted for COPD exacerbation and hypoxia.  Also found to be hypoglycemic  6/12: Potassium 2.9, on 4 L oxygen via nasal cannula 6/13: Kidney function still worsening   Assessment and Plan: * Hypoglycemia Patient presented to the ER after she was found unresponsive at home and with blood sugar of 57 that improved following oral supplementation of glucose as well as 250 cc of D10 infusion. Chart review shows that she is on Lantus 30 units twice daily with a NovoLog FlexPen sliding scale coverage.  Patient had taken her insulin prior to coming in, but was unsure if she had eaten before.  Blood sugars creeping up so restarted insulin Lantus/Semglee at 20 units daily for now.  Adjust as needed  Hemoglobin A1c 7.2  Acute on chronic respiratory failure with hypoxia and hypercapnia (HCC) Most likely secondary to acute COPD exacerbation.  Initially quiring BiPAP, but since then has been able to wean down to 4 L nasal cannula which is her baseline oxygen requirement.   COPD with acute exacerbation ([place]) Patient with a history of COPD and chronic respiratory failure who presents for evaluation of mental status changes  She has a nonproductive cough and nasal congestion Continue scheduled and as needed bronchodilator therapy Continue budesonide twice daily Continue oxygen supplementation to maintain pulse oximetry  greater than 92%-currently on 4 L oxygen which is her baseline  CKD stage 4 due to type 2 diabetes mellitus ([place]) Patient has chronic kidney disease related to her known diabetes mellitus with a baseline serum creatinine of 3.23.  Lab Results  Component Value Date   CREATININE 4.83 (H) 06/17/2021   CREATININE 4.69 (H) 06/16/2021   CREATININE 4.55 (H) 06/16/2021   Her kidney function is worsening.  Nephrology following and may need AV access and consider renal replacement therapy in the near future.  Also being evaluated by transplant as per [facility].  Lasix on hold  Sleep apnea Continue CPAP at bedtime  Essential hypertension Blood pressure is controlled with amlodipine and carvedilol.   Hypothermia-resolved as of 06/15/2021 Most likely related to hypoglycemia Patient placed on [place] hugger and temperature now normalized  Chronic diastolic CHF (congestive heart failure) ([place]) Well compensated at this time.  Lasix on hold as renal function worsening  Hyperthyroidism Continue Tapazole.  Morbid obesity ([place]) Meets criteria BMI greater than 35 and comorbidities of sleep apnea and hypertension        Subjective: Denies any new issues.  Kidney function slowly worsening  Physical Exam: Vitals:   06/17/21 0600 06/17/21 0807 06/17/21 0834 06/17/21 1121  BP:   127/80 126/77  Pulse:  84 93 89  Resp: 20 (!) '22 16 18  '$ Temp:   98.2 F (36.8 C) 98.7 F (37.1 C)  TempSrc:   Oral   SpO2:  92% 97% 96%  Weight:      Height:       . General:Alert and oriented x2, no acute distress .  HEENT:Normocephalic, atraumatic, mucous membranes slightly dry. Poor dentition . Cardiovascular:Regular rate and rhythm, S1-S2 . Respiratory: Clear to auscultation bilaterally . Abdomen:Soft, nontender, nondistended, positive bowel sounds . Musculoskeletal:No clubbing or cyanosis, trace pitting edema . Skin:No skin breaks, tears or lesions . Psychiatry:Appropriate, no evidence of  psychoses . Neurology:No focal deficits .  Data Reviewed:  Creatinine 4.83, BUN 94  Family Communication: None  Disposition: Status is: Inpatient Remains inpatient appropriate because: Waiting for kidney function to plateau or improve before discharge   Planned Discharge Destination: Home    DVT prophylaxis-subcu heparin Time spent: 35 minutes  Author: Max Sane, MD 06/17/2021 12:45 PM  For on call review [email or web address].

## 2021-06-17 NOTE — Assessment & Plan Note (Signed)
Blood pressure is controlled with amlodipine and carvedilol.

## 2021-06-18 LAB — CBC
HCT: 33 % — ABNORMAL LOW (ref 36.0–46.0)
Hemoglobin: 10.1 g/dL — ABNORMAL LOW (ref 12.0–15.0)
MCH: 27.4 pg (ref 26.0–34.0)
MCHC: 30.6 g/dL (ref 30.0–36.0)
MCV: 89.7 fL (ref 80.0–100.0)
Platelets: 234 10*3/uL (ref 150–400)
RBC: 3.68 MIL/uL — ABNORMAL LOW (ref 3.87–5.11)
RDW: 12.6 % (ref 11.5–15.5)
WBC: 10.3 10*3/uL (ref 4.0–10.5)
nRBC: 0.2 % (ref 0.0–0.2)

## 2021-06-18 LAB — BASIC METABOLIC PANEL
Anion gap: 7 (ref 5–15)
BUN: 99 mg/dL — ABNORMAL HIGH (ref 8–23)
CO2: 23 mmol/L (ref 22–32)
Calcium: 8.8 mg/dL — ABNORMAL LOW (ref 8.9–10.3)
Chloride: 108 mmol/L (ref 98–111)
Creatinine, Ser: 4.4 mg/dL — ABNORMAL HIGH (ref 0.44–1.00)
GFR, Estimated: 11 mL/min — ABNORMAL LOW (ref >60)
Glucose, Bld: 141 mg/dL — ABNORMAL HIGH (ref 70–99)
Potassium: 3.9 mmol/L (ref 3.5–5.1)
Sodium: 138 mmol/L (ref 135–145)

## 2021-06-18 LAB — PARATHYROID HORMONE, INTACT (NO CA): PTH: 199 pg/mL — ABNORMAL HIGH (ref 15–65)

## 2021-06-18 LAB — GLUCOSE, CAPILLARY
Glucose-Capillary: 149 mg/dL — ABNORMAL HIGH (ref 70–99)
Glucose-Capillary: 144 mg/dL — ABNORMAL HIGH (ref 70–99)
Glucose-Capillary: 217 mg/dL — ABNORMAL HIGH (ref 70–99)

## 2021-06-18 LAB — PHOSPHORUS: Phosphorus: 4.8 mg/dL — ABNORMAL HIGH (ref 2.5–4.6)

## 2021-06-18 LAB — MAGNESIUM: Magnesium: 2.2 mg/dL (ref 1.7–2.4)

## 2021-06-18 MED ORDER — NOVOLOG FLEXPEN 100 UNIT/ML ~~LOC~~ SOPN: 6.0000 [IU] | PEN_INJECTOR | Freq: Three times a day (TID) | SUBCUTANEOUS | 3 refills | Status: DC

## 2021-06-18 MED ORDER — POTASSIUM CHLORIDE CRYS ER 20 MEQ PO TBCR
20.0000 meq | EXTENDED_RELEASE_TABLET | Freq: Once | ORAL | Status: AC
  Administered 2021-06-18: 20 meq via ORAL
  Filled 2021-06-18: qty 1

## 2021-06-18 MED ORDER — LANTUS SOLOSTAR 100 UNIT/ML ~~LOC~~ SOPN: 20.0000 [IU] | PEN_INJECTOR | Freq: Every day | SUBCUTANEOUS | 3 refills | Status: DC

## 2021-06-18 NOTE — Inpatient Diabetes Management (Addendum)
Inpatient Diabetes Program Recommendations  AACE/ADA: New Consensus Statement on Inpatient Glycemic Control (2015)  Target Ranges:  Prepandial:   less than 140 mg/dL      Peak postprandial:   less than 180 mg/dL (1-2 hours)      Critically ill patients:  140 - 180 mg/dL   Lab Results  Component Value Date   GLUCAP 217 (H) 06/18/2021   HGBA1C 7.2 (A) 05/20/2021    Review of Glycemic Control  Diabetes history: DM Outpatient Diabetes medications: Lantus 35 units BID, Novolog 24-28 units BID, Ozempic 1 mg Q Saturday Current orders for Inpatient glycemic control: Semglee 20 units QD, Novolog 0-9 units Q4H  Inpatient Diabetes Program Recommendations:    Has a diet order and is eating.  Might consider:  Novolog 0-9 units TID and 0-5 units QHS Novolog 2 units TID with meals if consumes at least 50% (Postprandials slightly elevated)  Will continue to follow while inpatient.  Thank you, Reche Dixon, MSN, [place] Diabetes Coordinator Inpatient Diabetes Program [phone] (team pager from 8a-5p)

## 2021-06-18 NOTE — Progress Notes (Signed)
PHARMACY CONSULT NOTE - FOLLOW UP  Pharmacy Consult for Electrolyte Monitoring and Replacement   Recent Labs: Potassium (mmol/L)  Date Value  06/18/2021 3.9  06/02/2013 3.5   Magnesium (mg/dL)  Date Value  06/18/2021 2.2   Calcium (mg/dL)  Date Value  06/18/2021 8.8 (L)   Calcium, Total (mg/dL)  Date Value  06/02/2013 9.5   Albumin (g/dL)  Date Value  06/14/2021 2.9 (L)  05/14/2016 3.6  06/02/2013 3.0 (L)   Phosphorus (mg/dL)  Date Value  06/18/2021 4.8 (H)   Sodium (mmol/L)  Date Value  06/18/2021 138  05/14/2016 142  06/02/2013 140     Assessment: 61 yo F w/ h/o DM, CKD4-5, diaCHF, COPD, HTN, & OSA admitted for hypoglycemia & COPDE. Pharmacy consulted for mgmt of electrolytes  Goal of Therapy:  Electrolytes WNL. K >4, Mg >2  Plan:  K 3.9. Give Kcl 36mq PO x1.  Mg 2.2, no additional replacement at this time Will follow up AM labs and replace electrolytes as needed  AEstoPharmacist 06/18/2021 8:24 AM

## 2021-06-18 NOTE — Progress Notes (Signed)
[facility]  PROGRESS NOTE   Subjective:   Patient seen sitting up in bed Alert and oriented  Tolerating meals Remains on room air  Creatinine 4.40  Objective:  Vital signs: Blood pressure 134/88, pulse 88, temperature 98.7 F (37.1 C), resp. rate 18, height '5\' 4"'$  (1.626 m), weight 99 kg, SpO2 99 %. No intake or output data in the 24 hours ending 06/18/21 1130  Filed Weights   06/14/21 0720  Weight: 99 kg     Physical Exam: General:  No acute distress  Head:  Normocephalic, atraumatic. Moist oral mucosal membranes  Eyes:  Anicteric  Lungs:   Clear to auscultation, normal effort  Heart:  S1S2 no rubs  Abdomen:   Soft, nontender, bowel sounds present  Extremities: trace peripheral edema.  Neurologic:  Awake, alert, following commands  Skin:  No lesions, dry BLE  Access: none    Basic Metabolic Panel: Recent Labs  Lab 06/15/21 0524 06/16/21 0512 06/16/21 1426 06/17/21 0350 06/18/21 0346  NA 137 138 136 136 138  K 3.5 2.9* 3.8 3.7 3.9  CL 104 104 104 107 108  CO2 '24 25 22 23 23  '$ GLUCOSE 246* 198* 243* 144* 141*  BUN 59* 81* 88* 94* 99*  CREATININE 4.03* 4.55* 4.69* 4.83* 4.40*  CALCIUM 8.8* 8.5* 8.6* 8.6* 8.8*  MG  --  1.9  --  2.2 2.2  PHOS  --   --   --  4.7* 4.8*     CBC: Recent Labs  Lab 06/14/21 0727 06/15/21 0524 06/17/21 0350 06/18/21 0346  WBC 8.6 6.4 10.2 10.3  HGB 11.5* 10.7* 10.1* 10.1*  HCT 39.3 34.4* 33.1* 33.0*  MCV 92.7 89.4 89.2 89.7  PLT 223 228 241 234      Urinalysis: No results for input(s): "COLORURINE", "LABSPEC", "PHURINE", "GLUCOSEU", "HGBUR", "BILIRUBINUR", "KETONESUR", "PROTEINUR", "UROBILINOGEN", "NITRITE", "LEUKOCYTESUR" in the last 72 hours.  Invalid input(s): "APPERANCEUR"    Imaging: No results found.   Medications:    sodium chloride      amLODipine  5 mg Oral Daily   aspirin EC  81 mg Oral Daily   atorvastatin  40 mg Oral QHS   budesonide (PULMICORT) nebulizer solution  0.5 mg  Nebulization Q12H   carvedilol  25 mg Oral BID   heparin injection (subcutaneous)  5,000 Units Subcutaneous Q8H   insulin aspart  0-9 Units Subcutaneous Q4H   insulin glargine-yfgn  20 Units Subcutaneous Daily   ipratropium-albuterol  3 mL Nebulization TID   methimazole  5 mg Oral Daily   modafinil  100 mg Oral Daily   pantoprazole  40 mg Oral Daily   sodium chloride flush  3 mL Intravenous Q12H   Vitamin D (Ergocalciferol)  50,000 Units Oral Q7 days    Assessment/ Plan:     Principal Problem:   Hypoglycemia Active Problems:   Essential hypertension   Hyperthyroidism   Chronic diastolic CHF (congestive heart failure) (HCC)   COPD with acute exacerbation (HCC)   Sleep apnea   CKD stage 4 due to type 2 diabetes mellitus (HCC)   Acute on chronic respiratory failure with hypoxia and hypercapnia (HCC)   Morbid obesity ([place])  Patient is a 61 y.o. 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     #1: Acute kidney injury on CKD stage IV.  Renal function continues to decline, Furosemide held.   Creatinine slightly improved today, 4.4 from 4.83 yesterday.  No urine output recorded.  Patient now tolerating meals without nausea or vomiting.  Patient cleared to discharge from renal stance  to follow-up with [facility] nephrology.  Patient states she has scheduled appointment next week.   #2: Shortness of breath/CHF: Furosemide held.  Minimal edema and patient on baseline oxygen requirements   #3: Hypoglycemia: resolved with treatment   #4: Obstructive sleep apnea/COPD: nightly Cpap   #5: Anemia with chronic kidney disease: Hgb 10.1   #6: Secondary hyperparathyroidism: Calcium and phosphorus levels at goal. PTH pending.  Continue vitamin D.   #7: Hypertension: Continue  amlodipine and carvedilol.     LOS: [facility] 6/14/202311:30 AM

## 2021-06-18 NOTE — Discharge Summary (Signed)
Physician Discharge Summary   Patient: Emily [name] MRN: [id number] DOB: [birth year]  Admit date:     06/14/2021  Discharge date: 06/18/21  Discharge Physician: Lorella Nimrod   PCP: [facility]   Recommendations at discharge:  Please obtain BMP within next few days Follow-up with primary care provider within a week Follow-up with nephrology within a week  Discharge Diagnoses: Principal Problem:   Hypoglycemia Active Problems:   Acute on chronic respiratory failure with hypoxia and hypercapnia (HCC)   COPD with acute exacerbation ([place])   Sleep apnea   CKD stage 4 due to type 2 diabetes mellitus ([place])   Essential hypertension   Chronic diastolic CHF (congestive heart failure) ([place])   Hyperthyroidism   Morbid obesity ([place])  Resolved Problems:   Hypothermia  Hospital Course: 61 year old female with past medical history of diabetes mellitus and stage IV-V chronic kidney disease plus obstructive sleep apnea, diastolic heart failure and COPD with chronic respiratory failure on 4 L nasal cannula brought into the emergency room on the morning of 6/10 after being found unresponsive and noted low blood sugars.  Patient received oral glucose by EMS and became more responsive but still lethargic.  Admitted for COPD exacerbation and hypoxia.  Also found to be hypoglycemic  6/12: Potassium 2.9, on 4 L oxygen via nasal cannula 6/13: Kidney function still worsening.  6/14: Renal functions with slight improvement.  She was restarted on a lower dose of insulin as blood sugar glucose level started going up.  She is being discharged home on Lantus 20 units daily and 6 units of NovoLog with meals instead of 18.  Patient need to check her blood glucose level closely and have a close follow-up with primary care provider for better management of her diabetes and to avoid hypoglycemia.  Her renal function seems stabilized with slight improvement today.  Discussed with  nephrology and they are recommending discharge with a close outpatient nephrology follow-up.  They will arrange for dialysis if needed as an outpatient.  She will continue with current medications and will follow-up with her providers.  Assessment and Plan: * Hypoglycemia Patient presented to the ER after she was found unresponsive at home and with blood sugar of 57 that improved following oral supplementation of glucose as well as 250 cc of D10 infusion. Chart review shows that she is on Lantus 30 units twice daily with a NovoLog FlexPen sliding scale coverage.  Patient had taken her insulin prior to coming in, but was unsure if she had eaten before.  Blood sugars creeping up so restarted insulin Lantus/Semglee at 20 units daily for now.  Adjust as needed  Hemoglobin A1c 7.2  Acute on chronic respiratory failure with hypoxia and hypercapnia (HCC) Most likely secondary to acute COPD exacerbation.  Initially quiring BiPAP, but since then has been able to wean down to 4 L nasal cannula which is her baseline oxygen requirement.   COPD with acute exacerbation ([place]) Patient with a history of COPD and chronic respiratory failure who presents for evaluation of mental status changes  She has a nonproductive cough and nasal congestion Continue scheduled and as needed bronchodilator therapy Continue budesonide twice daily Continue oxygen supplementation to maintain pulse oximetry greater than 92%-currently on 4 L oxygen which is her baseline  CKD stage 4 due to type 2 diabetes mellitus ([place]) Patient has chronic kidney disease related to her known diabetes mellitus with a baseline serum creatinine of 3.23.  Lab Results  Component Value Date  CREATININE 4.83 (H) 06/17/2021   CREATININE 4.69 (H) 06/16/2021   CREATININE 4.55 (H) 06/16/2021   Her kidney function is worsening.  Nephrology following and may need AV access and consider renal replacement therapy in the near future.  Also being evaluated  by transplant as per [facility].  Lasix on hold  Sleep apnea Continue CPAP at bedtime  Essential hypertension Blood pressure is controlled with amlodipine and carvedilol.   Hypothermia-resolved as of 06/15/2021 Most likely related to hypoglycemia Patient placed on Bair hugger and temperature now normalized  Chronic diastolic CHF (congestive heart failure) ([place]) Well compensated at this time.  Lasix on hold as renal function worsening  Hyperthyroidism Continue Tapazole.  Morbid obesity ([place]) Meets criteria BMI greater than 35 and comorbidities of sleep apnea and hypertension   Consultants: Nephrology Procedures performed: None Disposition: Home Diet recommendation:  Discharge Diet Orders (From admission, onward)     Start     Ordered   06/18/21 0000  Diet - low sodium heart healthy        06/18/21 1206           Renal diet DISCHARGE MEDICATION: Allergies as of 06/18/2021   No Known Allergies      Medication List     STOP taking these medications    Acetylcysteine 600 MG Caps   chlorthalidone 25 MG tablet Commonly known as: HYGROTON   cyclobenzaprine 10 MG tablet Commonly known as: FLEXERIL       TAKE these medications    Accu-Chek Guide test strip Generic drug: glucose blood USE TO CHECK BLOOD SUGAR THREE TIMES DAILY   Accu-Chek Guide w/Device Kit Use as instructed to check blood sugar 3 times a day. E11.59   Accu-Chek Softclix Lancets lancets USE TO CHECK BLOOD SUGAR THREE TIMES DAILY AS INSTRUCTED   albuterol 108 (90 Base) MCG/ACT inhaler Commonly known as: VENTOLIN HFA Inhale 1-2 puffs into the lungs every 6 (six) hours as needed for wheezing or shortness of breath.   albuterol (2.5 MG/3ML) 0.083% nebulizer solution Commonly known as: PROVENTIL Take 3 mLs (2.5 mg total) by nebulization every 6 (six) hours as needed for wheezing or shortness of breath.   amLODipine 5 MG tablet Commonly known as: NORVASC Take 5 mg by mouth daily. What  changed: Another medication with the same name was removed. Continue taking this medication, and follow the directions you see here.   Armodafinil 50 MG tablet Take 50 mg by mouth daily.   aspirin EC 81 MG tablet Take 81 mg by mouth daily. What changed: Another medication with the same name was removed. Continue taking this medication, and follow the directions you see here.   atorvastatin 40 MG tablet Commonly known as: LIPITOR TAKE ONE TABLET BY MOUTH EVERY DAY What changed: when to take this   BD Pen Needle Micro U/F 32G X 6 MM Misc Generic drug: Insulin Pen Needle SMARTSIG:Pre-Filled Pen Syringe Injection 3 Times Daily   budesonide-formoterol 160-4.5 MCG/ACT inhaler Commonly known as: SYMBICORT Inhale 2 puffs into the lungs 2 (two) times daily.   carvedilol 25 MG tablet Commonly known as: COREG Take 1 tablet by mouth twice daily What changed: when to take this   esomeprazole 40 MG capsule Commonly known as: NEXIUM Take 40 mg by mouth daily. am   fluticasone 50 MCG/ACT nasal spray Commonly known as: FLONASE Place 1 spray into both nostrils daily.   ipratropium 0.06 % nasal spray Commonly known as: ATROVENT Place 2 sprays into both nostrils 4 (four)  times daily.   Lantus SoloStar 100 UNIT/ML Solostar Pen Generic drug: insulin glargine Inject 20 Units into the skin at bedtime. INJECT 30 UNITS SUBCUTANEOUSLY IN THE MORNING AND 30 UNITS AT NIGHT What changed:  how much to take how to take this when to take this   methimazole 5 MG tablet Commonly known as: TAPAZOLE Take 1 tablet (5 mg total) by mouth daily.   NovoLOG FlexPen 100 UNIT/ML FlexPen Generic drug: insulin aspart Inject 6 Units into the skin 3 (three) times daily with meals. Inject 18-24 units 2x a day before meals What changed:  how much to take how to take this when to take this   Ozempic (1 MG/DOSE) 4 MG/3ML Sopn Generic drug: Semaglutide (1 MG/DOSE) Inject 1 mg into the skin once a week.    potassium chloride 10 MEQ tablet Commonly known as: KLOR-CON Take 20 mEq by mouth daily.   tiotropium 18 MCG inhalation capsule Commonly known as: SPIRIVA Place 18 mcg into inhaler and inhale daily.   Vitamin D (Ergocalciferol) 1.25 MG (50000 UNIT) Caps capsule Commonly known as: DRISDOL Take 50,000 Units by mouth every 7 (seven) days.        [place], [facility]. Schedule an appointment as soon as possible for a visit in 1 week(s).   Contact information: [address] [place] [address] [phone]                Discharge Exam: Filed Weights   06/14/21 0720  Weight: 99 kg   General.     In no acute distress. Pulmonary.  Lungs clear bilaterally, normal respiratory effort. CV.  Regular rate and rhythm, no JVD, rub or murmur. Abdomen.  Soft, nontender, nondistended, BS positive. CNS.  Alert and oriented .  No focal neurologic deficit. Extremities.  No edema, no cyanosis, pulses intact and symmetrical. Psychiatry.  Judgment and insight appears normal.   Condition at discharge: stable  The results of significant diagnostics from this hospitalization (including imaging, microbiology, ancillary and laboratory) are listed below for reference.   Imaging Studies: CT CHEST WO CONTRAST  Result Date: 06/14/2021 CLINICAL DATA:  Altered mental status, COPD EXAM: CT CHEST WITHOUT CONTRAST TECHNIQUE: Multidetector CT imaging of the chest was performed following the standard protocol without IV contrast. RADIATION DOSE REDUCTION: This exam was performed according to the departmental dose-optimization program which includes automated exposure control, adjustment of the mA and/or kV according to patient size and/or use of iterative reconstruction technique. COMPARISON:  02/15/2018 FINDINGS: Cardiovascular: Heart size upper limits normal. Trace pericardial fluid. Mild coronary calcifications. Scattered aortic calcifications.  Mediastinum/Nodes: Enlarged heterogenous thyroid, which has been recently evaluated with ultrasound. No mediastinal hematoma. 1 cm pre-vascular lymph node (Im42,Se3) , previously 9 mm. 11 mm high right paratracheal node previously 9 mm image 37. No hilar adenopathy, sensitivity decreased in the absence of IV contrast. Lungs/Pleura: No pleural effusion. New linear subsegmental atelectasis in anterior left upper lobe. Interval resolution of previously noted right lower lobe airspace disease. Upper Abdomen: Cholecystectomy clips.  No acute findings. Musculoskeletal: DISH across multiple levels in the mid and lower thoracic spine. IMPRESSION: 1. Anterior left upper lobe linear atelectasis or scarring. 2. Borderline mediastinal lymph node enlargement, possibly reactive but nonspecific. 3. Coronary and Aortic Atherosclerosis (ICD10-170.0). Electronically Signed   By: Lucrezia Europe M.D.   On: 06/14/2021 10:00   DG Chest Port 1 View  Result Date: 06/14/2021 CLINICAL DATA:  Shortness of breath EXAM: PORTABLE CHEST 1  VIEW COMPARISON:  Radiograph 04/25/2019, CT 02/15/2018 FINDINGS: Unchanged enlarged cardiac silhouette. Mild pulmonary vascular congestion. There are streaky left mid to lower lung opacities. No large pleural effusion. No pneumothorax. No acute osseous abnormality. IMPRESSION: Cardiomegaly with mild pulmonary vascular congestion. Streaky left mid to lower lung opacities favored to be atelectasis/scarring. Electronically Signed   By: Maurine Simmering M.D.   On: 06/14/2021 08:13    Microbiology: Results for orders placed or performed during the hospital encounter of 06/14/21  Respiratory (~20 pathogens) panel by PCR     Status: Abnormal   Collection Time: 06/14/21 12:00 PM  Result Value Ref Range Status   Adenovirus NOT DETECTED NOT DETECTED Final   Coronavirus 229E NOT DETECTED NOT DETECTED Final    Comment: (NOTE) The Coronavirus on the Respiratory Panel, DOES NOT test for the novel  Coronavirus (2019  nCoV)    Coronavirus HKU1 NOT DETECTED NOT DETECTED Final   Coronavirus NL63 NOT DETECTED NOT DETECTED Final   Coronavirus OC43 NOT DETECTED NOT DETECTED Final   Metapneumovirus NOT DETECTED NOT DETECTED Final   Rhinovirus / Enterovirus NOT DETECTED NOT DETECTED Final   Influenza A NOT DETECTED NOT DETECTED Final   Influenza B NOT DETECTED NOT DETECTED Final   Parainfluenza Virus 1 NOT DETECTED NOT DETECTED Final   Parainfluenza Virus 2 NOT DETECTED NOT DETECTED Final   Parainfluenza Virus 3 DETECTED (A) NOT DETECTED Final   Parainfluenza Virus 4 NOT DETECTED NOT DETECTED Final   Respiratory Syncytial Virus NOT DETECTED NOT DETECTED Final   Bordetella pertussis NOT DETECTED NOT DETECTED Final   Bordetella Parapertussis NOT DETECTED NOT DETECTED Final   Chlamydophila pneumoniae NOT DETECTED NOT DETECTED Final   Mycoplasma pneumoniae NOT DETECTED NOT DETECTED Final    Comment: Performed at [facility], [address] [place], [place] [address]    Labs: CBC: Recent Labs  Lab 06/14/21 [phone] 06/15/21 0524 06/17/21 0350 06/18/21 0346  WBC 8.6 6.4 10.2 10.3  HGB 11.5* 10.7* 10.1* 10.1*  HCT 39.3 34.4* 33.1* 33.0*  MCV 92.7 89.4 89.2 89.7  PLT 223 228 241 076   Basic Metabolic Panel: Recent Labs  Lab 06/15/21 0524 06/16/21 0512 06/16/21 1426 06/17/21 0350 06/18/21 0346  NA 137 138 136 136 138  K 3.5 2.9* 3.8 3.7 3.9  CL 104 104 104 107 108  CO2 '24 25 22 23 23  ' GLUCOSE 246* 198* 243* 144* 141*  BUN 59* 81* 88* 94* 99*  CREATININE 4.03* 4.55* 4.69* 4.83* 4.40*  CALCIUM 8.8* 8.5* 8.6* 8.6* 8.8*  MG  --  1.9  --  2.2 2.2  PHOS  --   --   --  4.7* 4.8*   Liver Function Tests: Recent Labs  Lab 06/14/21 0727  AST 16  ALT 13  ALKPHOS 79  BILITOT 0.4  PROT 7.2  ALBUMIN 2.9*   CBG: Recent Labs  Lab 06/17/21 2039 06/17/21 2330 06/18/21 0332 06/18/21 0849 06/18/21 1130  GLUCAP 193* 186* 149* 144* 217*    Discharge time spent: greater than 30  minutes.  This record has been created using Systems analyst. Errors have been sought and corrected,but may not always be located. Such creation errors do not reflect on the standard of care.   Signed: Lorella Nimrod, MD [facility] 06/18/2021

## 2021-06-21 ENCOUNTER — Other Ambulatory Visit: Payer: Self-pay | Admitting: Internal Medicine

## 2021-06-21 DIAGNOSIS — E108 Type 1 diabetes mellitus with unspecified complications: Secondary | ICD-10-CM

## 2021-06-23 ENCOUNTER — Other Ambulatory Visit: Payer: Self-pay | Admitting: Internal Medicine

## 2021-06-23 DIAGNOSIS — E108 Type 1 diabetes mellitus with unspecified complications: Secondary | ICD-10-CM

## 2021-07-17 ENCOUNTER — Ambulatory Visit: Payer: Medicaid Other | Admitting: Podiatry

## 2021-07-17 ENCOUNTER — Encounter: Payer: Self-pay | Admitting: Podiatry

## 2021-07-17 DIAGNOSIS — E1159 Type 2 diabetes mellitus with other circulatory complications: Secondary | ICD-10-CM

## 2021-07-17 DIAGNOSIS — B351 Tinea unguium: Secondary | ICD-10-CM | POA: Diagnosis not present

## 2021-07-17 DIAGNOSIS — M79675 Pain in left toe(s): Secondary | ICD-10-CM | POA: Diagnosis not present

## 2021-07-17 DIAGNOSIS — I872 Venous insufficiency (chronic) (peripheral): Secondary | ICD-10-CM

## 2021-07-17 DIAGNOSIS — N189 Chronic kidney disease, unspecified: Secondary | ICD-10-CM

## 2021-07-17 DIAGNOSIS — M79674 Pain in right toe(s): Secondary | ICD-10-CM

## 2021-07-17 NOTE — Progress Notes (Signed)
Complaint:  Visit Type: Patient returns to my office for continued preventative foot care services. Complaint: Patient states" my nails have grown long and thick and become painful to walk and wear shoes" Patient has been diagnosed with DM with vascular disease and chronic venous insufficiency and chronic kidney disease.. The patient presents for preventative foot care services.  Podiatric Exam: Vascular: dorsalis pedis and posterior tibial pulses are weakly  palpable bilateral. Capillary return is immediate. Temperature gradient is WNL. Skin turgor WNL  Sensorium: Normal Semmes Weinstein monofilament test. Normal tactile sensation bilaterally. Nail Exam: Pt has thick disfigured discolored nails with subungual debris noted bilateral entire nail hallux through fifth toenails Ulcer Exam: There is no evidence of ulcer or pre-ulcerative changes or infection. Orthopedic Exam: Muscle tone and strength are WNL. No limitations in general ROM. No crepitus or effusions noted. Foot type and digits show no abnormalities. Bony prominences are unremarkable. Skin: No Porokeratosis. No infection or ulcers  Diagnosis:  Onychomycosis, , Pain in right toe, pain in left toes  Treatment & Plan Procedures and Treatment: Consent by patient was obtained for treatment procedures.   Debridement of mycotic and hypertrophic toenails, 1 through 5 bilateral and clearing of subungual debris. No ulceration, no infection noted.  Return Visit-Office Procedure: Patient instructed to return to the office for a follow up visit 3 months for continued evaluation and treatment. RTC 3 months    Gardiner Barefoot DPM

## 2021-08-06 ENCOUNTER — Other Ambulatory Visit (INDEPENDENT_AMBULATORY_CARE_PROVIDER_SITE_OTHER): Payer: Self-pay | Admitting: Nurse Practitioner

## 2021-08-06 DIAGNOSIS — N184 Chronic kidney disease, stage 4 (severe): Secondary | ICD-10-CM

## 2021-08-06 DIAGNOSIS — E1122 Type 2 diabetes mellitus with diabetic chronic kidney disease: Secondary | ICD-10-CM

## 2021-08-07 ENCOUNTER — Ambulatory Visit (INDEPENDENT_AMBULATORY_CARE_PROVIDER_SITE_OTHER): Payer: Medicaid Other | Admitting: Nurse Practitioner

## 2021-08-07 ENCOUNTER — Encounter (INDEPENDENT_AMBULATORY_CARE_PROVIDER_SITE_OTHER): Payer: Self-pay | Admitting: Nurse Practitioner

## 2021-08-07 ENCOUNTER — Ambulatory Visit (INDEPENDENT_AMBULATORY_CARE_PROVIDER_SITE_OTHER): Payer: Medicaid Other

## 2021-08-07 VITALS — BP 183/96 | HR 91 | Resp 17 | Ht 66.0 in | Wt 219.8 lb

## 2021-08-07 DIAGNOSIS — E1122 Type 2 diabetes mellitus with diabetic chronic kidney disease: Secondary | ICD-10-CM

## 2021-08-07 DIAGNOSIS — I10 Essential (primary) hypertension: Secondary | ICD-10-CM | POA: Diagnosis not present

## 2021-08-07 DIAGNOSIS — N184 Chronic kidney disease, stage 4 (severe): Secondary | ICD-10-CM

## 2021-08-07 DIAGNOSIS — E782 Mixed hyperlipidemia: Secondary | ICD-10-CM | POA: Diagnosis not present

## 2021-08-07 DIAGNOSIS — N186 End stage renal disease: Secondary | ICD-10-CM

## 2021-08-07 DIAGNOSIS — I890 Lymphedema, not elsewhere classified: Secondary | ICD-10-CM | POA: Diagnosis not present

## 2021-08-07 NOTE — Progress Notes (Signed)
Subjective:    Patient ID: Emily [name], female    DOB: 1960/08/03, 61 y.o.   MRN: [id number] No chief complaint on file.   The patient is seen for evaluation for dialysis access. The patient has chronic renal insufficiency stage 5 secondary to diabetes. The patient's most recent creatinine clearance is less than 15. The patient volume status has not yet become an issue. Patient's blood pressures are elevated. There are mild uremic symptoms which appear to be relatively well tolerated at this time.  The patient notes the kidney problem has been present for a long time and has been progressively getting worse.  The patient is followed by nephrology.    The patient is right-handed.  The patient notes that she is not scheduled for dialysis at this point time however due to the successively worsening renal function she was referred by her nephrologist for evaluation in preparation for fistula so that it may mature prior to need.  No recent shortening of the patient's walking distance or new symptoms consistent with claudication.  No history of rest pain symptoms. No new ulcers or wounds of the lower extremities have occurred.  The patient denies amaurosis fugax or recent TIA symptoms. There are no recent neurological changes noted. There is no history of DVT, PE or superficial thrombophlebitis. No recent episodes of angina or shortness of breath documented.      Review of Systems  Cardiovascular:  Positive for leg swelling.  All other systems reviewed and are negative.      Objective:   Physical Exam Vitals reviewed.  HENT:     Head: Normocephalic.  Cardiovascular:     Rate and Rhythm: Normal rate.     Pulses:          Radial pulses are 2+ on the right side and 2+ on the left side.  Pulmonary:     Effort: Pulmonary effort is normal.  Musculoskeletal:     Right lower leg: 1+ Edema present.     Left lower leg: 1+ Edema present.  Skin:    General: Skin is warm and dry.   Neurological:     Mental Status: She is alert and oriented to person, place, and time.  Psychiatric:        Mood and Affect: Mood normal.        Behavior: Behavior normal.        Thought Content: Thought content normal.        Judgment: Judgment normal.     BP (!) 183/96 (BP Location: Right Arm)   Pulse 91   Resp 17   Ht _0  (1.676 m)   Wt 219 lb 12.8 oz (99.7 kg)   BMI 35.48 kg/m   Past Medical History:  Diagnosis Date   Arthritis    hands, all over   Asthma    CHF (congestive heart failure) (HCC)    COPD (chronic obstructive pulmonary disease) (HCC)    on 2L home o2   Diabetes mellitus without complication (HCC)    type 2   Dyspnea    Hyperlipidemia    Hypertension    Sleep apnea    CPAP   Stroke [facility])    Was told at recent hospital visit she has stroke in past   Thyroid disease    Thyroid nodule 04/30/2016    Social History   Socioeconomic History   Marital status: Single    Spouse name: Not on file   Number of children: Not on file  Years of education: Not on file   Highest education level: Not on file  Occupational History   Occupation: unemployed  Tobacco Use   Smoking status: Former    Packs/day: 1.50    Years: 40.00    Total pack years: 60.00    Types: Cigarettes    Quit date: 01/13/2016    Years since quitting: 5.5   Smokeless tobacco: Never  Vaping Use   Vaping Use: Never used  Substance and Sexual Activity   Alcohol use: No   Drug use: No   Sexual activity: Not Currently  Other Topics Concern   Not on file  Social History Narrative   Lives at home with her mother.  Independent at baseline   Social Determinants of Health   Financial Resource Strain: Low Risk  (02/21/2018)   Overall Financial Resource Strain (CARDIA)    Difficulty of Paying Living Expenses: Not hard at all  Food Insecurity: No Food Insecurity (02/21/2018)   Hunger Vital Sign    Worried About Running Out of Food in the Last Year: Never true    Ran Out of Food in the  Last Year: Never true  Transportation Needs: No Transportation Needs (02/21/2018)   PRAPARE - Hydrologist (Medical): No    Lack of Transportation (Non-Medical): No  Physical Activity: Inactive (02/21/2018)   Exercise Vital Sign    Days of Exercise per Week: 0 days    Minutes of Exercise per Session: 0 min  Stress: No Stress Concern Present (02/21/2018)   [place]    Feeling of Stress : Not at all  Social Connections: Somewhat Isolated (02/21/2018)   Social Connection and Isolation Panel [NHANES]    Frequency of Communication with Friends and Family: Three times a week    Frequency of Social Gatherings with Friends and Family: Three times a week    Attends Religious Services: 1 to 4 times per year    Active Member of Clubs or Organizations: No    Attends Archivist Meetings: Never    Marital Status: Never married  Intimate Partner Violence: Not At Risk (02/21/2018)   Humiliation, Afraid, Rape, and Kick questionnaire    Fear of Current or Ex-Partner: No    Emotionally Abused: No    Physically Abused: No    Sexually Abused: No    Past Surgical History:  Procedure Laterality Date   arm surgery Left    COLONOSCOPY WITH PROPOFOL N/A 02/14/2019   Procedure: COLONOSCOPY WITH PROPOFOL;  Surgeon: [name], MD;  Location: [facility] ENDOSCOPY;  Service: Endoscopy;  Laterality: N/A;    Family History  Problem Relation Age of Onset   Diabetes Mother    Hypertension Mother    Diabetes Father    Hypertension Father    CAD Father    Diabetes Sister     No Known Allergies     Latest Ref Rng & Units 06/18/2021    3:46 AM 06/17/2021    3:50 AM 06/15/2021    5:24 AM  CBC  WBC 4.0 - 10.5 K/uL 10.3  10.2  6.4   Hemoglobin 12.0 - 15.0 g/dL 10.1  10.1  10.7   Hematocrit 36.0 - 46.0 % 33.0  33.1  34.4   Platelets 150 - 400 K/uL 234  241  228       CMP     Component Value Date/Time    NA 138 06/18/2021 0346   NA  142 05/14/2016 1852   NA 140 06/02/2013 1230   K 3.9 06/18/2021 0346   K 3.5 06/02/2013 1230   CL 108 06/18/2021 0346   CL 107 06/02/2013 1230   CO2 23 06/18/2021 0346   CO2 26 06/02/2013 1230   GLUCOSE 141 (H) 06/18/2021 0346   GLUCOSE 178 (H) 06/02/2013 1230   BUN 99 (H) 06/18/2021 0346   BUN 27 (H) 05/14/2016 1852   BUN 14 06/02/2013 1230   CREATININE 4.40 (H) 06/18/2021 0346   CREATININE 1.87 (H) 04/05/2018 1008   CALCIUM 8.8 (L) 06/18/2021 0346   CALCIUM 9.5 06/02/2013 1230   PROT 7.2 06/14/2021 0727   PROT 6.5 05/14/2016 1852   PROT 7.7 06/02/2013 1230   ALBUMIN 2.9 (L) 06/14/2021 0727   ALBUMIN 3.6 05/14/2016 1852   ALBUMIN 3.0 (L) 06/02/2013 1230   AST 16 06/14/2021 0727   AST 21 06/02/2013 1230   ALT 13 06/14/2021 0727   ALT 27 06/02/2013 1230   ALKPHOS 79 06/14/2021 0727   ALKPHOS 127 (H) 06/02/2013 1230   BILITOT 0.4 06/14/2021 0727   BILITOT 0.3 05/14/2016 1852   BILITOT 0.3 06/02/2013 1230   GFRNONAA 11 (L) 06/18/2021 0346   GFRNONAA 29 (L) 04/05/2018 1008   GFRAA 22 (L) 04/25/2019 1138   GFRAA 34 (L) 04/05/2018 1008     No results found.     Assessment & Plan:   1. CKD stage 4 due to type 2 diabetes mellitus ([place]) Recommend:  At this time the patient does not have appropriate extremity access for dialysis  Patient should have a left brachiocephalic AV fistula created.  The risks, benefits and alternative therapies were reviewed in detail with the patient.  All questions were answered.  The patient agrees to proceed with surgery.   The patient will follow up with me in the office after the surgery.   2. Lymphedema Lower extremity edema well-controlled today.  Advised to continue with conservative therapy including use of medical grade compression, elevation and activity  3. Essential hypertension Continue antihypertensive medications as already ordered, these medications have been reviewed and there are no changes at  this time.   4. Mixed hyperlipidemia Continue statin as ordered and reviewed, no changes at this time    Current Outpatient Medications on File Prior to Visit  Medication Sig Dispense Refill   ACCU-CHEK GUIDE test strip USE TO CHECK BLOOD SUGAR THREE TIMES DAILY 200 each prn   Accu-Chek Softclix Lancets lancets USE TO CHECK BLOOD SUGAR THREE TIMES DAILY AS INSTRUCTED 300 each 3   albuterol (PROVENTIL HFA;VENTOLIN HFA) 108 (90 Base) MCG/ACT inhaler Inhale 1-2 puffs into the lungs every 6 (six) hours as needed for wheezing or shortness of breath. 1 Inhaler 11   amLODipine (NORVASC) 5 MG tablet Take 5 mg by mouth daily.     Armodafinil (NUVIGIL) 50 MG tablet Take 1 tablet by mouth daily.     aspirin EC 81 MG tablet Take 81 mg by mouth daily.     atorvastatin (LIPITOR) 40 MG tablet TAKE ONE TABLET BY MOUTH EVERY DAY (Patient taking differently: Take 40 mg by mouth at bedtime.) 90 tablet PRN   BD PEN NEEDLE MICRO U/F 32G X 6 MM MISC SMARTSIG:Pre-Filled Pen Syringe Injection 3 Times Daily     Blood Glucose Monitoring Suppl (ACCU-CHEK GUIDE) w/Device KIT Use as instructed to check blood sugar 3 times a day. E11.59 1 kit 0   budesonide-formoterol (SYMBICORT) 160-4.5 MCG/ACT inhaler Inhale 2 puffs into the  lungs 2 (two) times daily.     carvedilol (COREG) 25 MG tablet Take 1 tablet by mouth twice daily (Patient taking differently: Take 25 mg by mouth 2 (two) times daily with a meal.) 180 tablet 3   esomeprazole (NEXIUM) 40 MG capsule Take 40 mg by mouth daily. am     fluticasone (FLONASE) 50 MCG/ACT nasal spray Place 1 spray into both nostrils daily.     insulin glargine (LANTUS SOLOSTAR) 100 UNIT/ML Solostar Pen Inject 20 Units into the skin at bedtime. INJECT 30 UNITS SUBCUTANEOUSLY IN THE MORNING AND 30 UNITS AT NIGHT 45 mL 3   ipratropium (ATROVENT) 0.06 % nasal spray Place 2 sprays into both nostrils 4 (four) times daily.     methimazole (TAPAZOLE) 5 MG tablet Take 1 tablet (5 mg total) by mouth  daily. 90 tablet 3   NOVOLOG FLEXPEN 100 UNIT/ML FlexPen Inject 6 Units into the skin 3 (three) times daily with meals. Inject 18-24 units 2x a day before meals 45 mL 3   potassium chloride (KLOR-CON) 10 MEQ tablet Take 20 mEq by mouth daily.     Semaglutide, 1 MG/DOSE, (OZEMPIC, 1 MG/DOSE,) 4 MG/3ML SOPN Inject 1 mg into the skin once a week. (Patient taking differently: Inject 1 mg into the skin once a week.) 9 mL 3   tiotropium (SPIRIVA) 18 MCG inhalation capsule Place 18 mcg into inhaler and inhale daily.     Vitamin D, Ergocalciferol, (DRISDOL) 1.25 MG (50000 UNIT) CAPS capsule Take 50,000 Units by mouth every 7 (seven) days.     albuterol (PROVENTIL) (2.5 MG/3ML) 0.083% nebulizer solution Take 3 mLs (2.5 mg total) by nebulization every 6 (six) hours as needed for wheezing or shortness of breath. (Patient not taking: Reported on 03/27/2021) 100 vial 0   Armodafinil 50 MG tablet Take 50 mg by mouth daily. (Patient not taking: Reported on 08/07/2021)     No current facility-administered medications on file prior to visit.    There are no Patient Instructions on file for this visit. No follow-ups on file.   Kris Hartmann, NP

## 2021-08-08 ENCOUNTER — Telehealth (INDEPENDENT_AMBULATORY_CARE_PROVIDER_SITE_OTHER): Payer: Self-pay

## 2021-08-08 NOTE — Telephone Encounter (Signed)
Spoke with the patient and she is scheduled with Dr. Delana Meyer for a left brachialcephalic AVF on 78/41/28 at the [facility]. Pre-op phone call is on 08/19/21 between 1-5 pm. Pre-surgical instructions were discussed and will be mailed.

## 2021-08-19 ENCOUNTER — Inpatient Hospital Stay: Admission: RE | Admit: 2021-08-19 | Payer: Medicaid Other | Source: Ambulatory Visit

## 2021-08-25 ENCOUNTER — Telehealth (INDEPENDENT_AMBULATORY_CARE_PROVIDER_SITE_OTHER): Payer: Self-pay

## 2021-08-25 ENCOUNTER — Inpatient Hospital Stay
Admission: RE | Admit: 2021-08-25 | Discharge: 2021-08-25 | Disposition: A | Discharge status: Home / Self Care | Payer: Medicaid Other | Source: Ambulatory Visit

## 2021-08-25 NOTE — Telephone Encounter (Signed)
Per SDS a message was received regarding the patient needing to be off Ozempic for a week, and the patient stating she was going to cancel her surgery because of personal reasons. I contacted the patient and per the patient she is canceling her surgery for now until her mother gets better and she will call to be rescheduled.

## 2021-08-27 ENCOUNTER — Encounter: Admission: RE | Payer: Self-pay | Source: Home / Self Care

## 2021-08-27 ENCOUNTER — Ambulatory Visit: Admission: RE | Admit: 2021-08-27 | Payer: Medicaid Other | Source: Home / Self Care | Admitting: Vascular Surgery

## 2021-08-27 SURGERY — ARTERIOVENOUS (AV) FISTULA CREATION
Anesthesia: General | Laterality: Left

## 2021-09-22 NOTE — Progress Notes (Unsigned)
Patient ID: Emily [name], female    DOB: 1960/03/23, 61 y.o.   MRN: [id number]   Emily [name] is a 61 y/o female with a history of asthma, DM, hyperlipidemia, HTN, stroke, thyroid disease, COPD, sleep apnea, former tobacco use and chronic heart failure.   Echo report from 04/23/20 reviewed and showed an EF of 50-55% with mild LVH. Echo report from 02/13/2018 reviewed and showed an EF of 60-65% without LVH.    Has not been admitted or been in the ED in the last 6 months.    She presents today for a follow-up visit with a chief complaint of moderate fatigue with minimal exertion. She describes this as chronic in nature having been present for years. She has associated shortness  of  breath and minimal pedal edema along with this. She denies any cough, chest pain, palpitations, abdominal distention, dizziness, difficulty sleeping or weight gain (although she doesn't weigh daily).   Says that she gets a lot of exercise by walking around [employer] often. Does lean on the buggy to assist so she doesn't get quite as tired.   Has worn compression socks in the past but not recently.   Past Medical History:  Diagnosis Date   Arthritis    hands, all over   Asthma    CHF (congestive heart failure) (HCC)    COPD (chronic obstructive pulmonary disease) (HCC)    on 2L home o2   Diabetes mellitus without complication (HCC)    type 2   Dyspnea    Hyperlipidemia    Hypertension    Sleep apnea    CPAP   Stroke [facility])    Was told at recent hospital visit she has stroke in past   Thyroid disease    Thyroid nodule 04/30/2016   Past Surgical History:  Procedure Laterality Date   arm surgery Left    COLONOSCOPY WITH PROPOFOL N/A 02/14/2019   Procedure: COLONOSCOPY WITH PROPOFOL;  Surgeon: [name], MD;  Location: [facility] ENDOSCOPY;  Service: Endoscopy;  Laterality: N/A;   Family History  Problem Relation Age of Onset   Diabetes Mother    Hypertension Mother    Diabetes Father    Hypertension Father     CAD Father    Diabetes Sister    Social History   Tobacco Use   Smoking status: Former    Packs/day: 1.50    Years: 40.00    Total pack years: 60.00    Types: Cigarettes    Quit date: 01/13/2016    Years since quitting: 5.6   Smokeless tobacco: Never  Substance Use Topics   Alcohol use: No   No Known Allergies  Prior to Admission medications   Medication Sig Start Date End Date Taking? Authorizing Provider  ACCU-CHEK GUIDE test strip USE TO CHECK BLOOD SUGAR THREE TIMES DAILY 01/01/21  Yes Philemon Kingdom, MD  Accu-Chek Softclix Lancets lancets USE TO CHECK BLOOD SUGAR THREE TIMES DAILY AS INSTRUCTED 03/05/21  Yes Philemon Kingdom, MD  Acetylcysteine 600 MG CAPS Take 1 capsule by mouth daily. 04/26/20  Yes [provider]  albuterol (PROVENTIL HFA;VENTOLIN HFA) 108 (90 Base) MCG/ACT inhaler Inhale 1-2 puffs into the lungs every 6 (six) hours as needed for wheezing or shortness of breath. 07/16/16  Yes Doles-Johnson, Teah, NP  amLODipine (NORVASC) 5 MG tablet Take 1 tablet by mouth daily. 07/15/20  Yes [provider]  Armodafinil 50 MG tablet Take 50 mg by mouth daily.   Yes [provider]  aspirin EC  81 MG tablet Take 81 mg by mouth daily.   Yes [provider]  atorvastatin (LIPITOR) 40 MG tablet TAKE ONE TABLET BY MOUTH EVERY DAY Patient taking differently: Take 40 mg by mouth at bedtime. 04/21/16  Yes Tawni Millers, MD  BD PEN NEEDLE MICRO U/F 32G X 6 MM MISC SMARTSIG:Pre-Filled Pen Syringe Injection 3 Times Daily 09/22/19  Yes [provider]  Blood Glucose Monitoring Suppl (ACCU-CHEK GUIDE) w/Device KIT Use as instructed to check blood sugar 3 times a day. E11.59 09/05/19  Yes Philemon Kingdom, MD  budesonide-formoterol [facility]) 80-4.5 MCG/ACT inhaler Inhale 2 puffs into the lungs 2 (two) times daily. 11/18/17  Yes Loletha Grayer, MD  carvedilol (COREG) 25 MG tablet Take 1 tablet by mouth twice daily 04/25/20  Yes Hackney, Tina  A, FNP  chlorthalidone (HYGROTON) 25 MG tablet Take 25 mg by mouth daily.   Yes [provider]  cyclobenzaprine (FLEXERIL) 10 MG tablet Take 1 tablet by mouth 3 (three) times daily as needed. 08/12/20  Yes [provider]  esomeprazole (NEXIUM) 40 MG capsule Take 40 mg by mouth daily. am   Yes [provider]  fluticasone (FLONASE) 50 MCG/ACT nasal spray Place 1 spray into both nostrils daily.   Yes [provider]  insulin glargine (LANTUS SOLOSTAR) 100 UNIT/ML Solostar Pen INJECT 30 UNITS SUBCUTANEOUSLY IN THE MORNING AND 30 UNITS AT NIGHT Patient taking differently: 35 Units. INJECT 35 UNITS SUBCUTANEOUSLY IN THE MORNING AND 35 UNITS AT NIGHT 09/19/20  Yes Philemon Kingdom, MD  ipratropium (ATROVENT) 0.06 % nasal spray Place 2 sprays into both nostrils 4 (four) times daily.   Yes [provider]  methimazole (TAPAZOLE) 5 MG tablet Take 1 tablet (5 mg total) by mouth daily. 12/05/20  Yes Philemon Kingdom, MD  NOVOLOG FLEXPEN 100 UNIT/ML FlexPen Inject 18-24 units 2x a day before meals Patient taking differently: Inject 24-28 units 2x a day before meals 09/19/20  Yes Philemon Kingdom, MD  potassium chloride (MICRO-K) 10 MEQ CR capsule Take 10 mEq by mouth 2 (two) times daily.   Yes [provider]  Semaglutide, 1 MG/DOSE, (OZEMPIC, 1 MG/DOSE,) 4 MG/3ML SOPN Inject 1 mg into the skin once a week. Patient taking differently: Inject 1 mg into the skin once a week. 09/19/20  Yes Philemon Kingdom, MD  tiotropium (SPIRIVA) 18 MCG inhalation capsule Place 18 mcg into inhaler and inhale daily.   Yes [provider]  Vitamin D, Ergocalciferol, (DRISDOL) 1.25 MG (50000 UNIT) CAPS capsule Take 50,000 Units by mouth every 7 (seven) days.   Yes [provider]  albuterol (PROVENTIL) (2.5 MG/3ML) 0.083% nebulizer solution Take 3 mLs (2.5 mg total) by nebulization every 6 (six) hours as needed for wheezing or shortness of breath. Patient not  taking: Reported on 03/27/2021 11/18/17   Loletha Grayer, MD    Review of Systems  Constitutional:  Positive for fatigue (easily). Negative for appetite change.  HENT:  Negative for congestion, postnasal drip and sore throat.   Eyes: Negative.   Respiratory:  Positive for shortness of breath (with moderate exertion). Negative for cough and chest tightness.   Cardiovascular:  Positive for leg swelling (both feet). Negative for chest pain and palpitations.  Gastrointestinal:  Negative for abdominal distention and abdominal pain.  Endocrine: Negative.   Genitourinary: Negative.   Musculoskeletal:  Negative for back pain and neck pain.  Skin: Negative.   Allergic/Immunologic: Negative.   Neurological:  Negative for dizziness and light-headedness.  Hematological:  Negative  for adenopathy. Does not bruise/bleed easily.  Psychiatric/Behavioral:  Negative for dysphoric mood and sleep disturbance (sleeping on 2 pillows; using CPAP). The patient is not nervous/anxious.     There were no vitals filed for this visit.  Wt Readings from Last 3 Encounters:  08/07/21 219 lb 12.8 oz (99.7 kg)  06/14/21 218 lb 4.1 oz (99 kg)  05/20/21 219 lb (99.3 kg)   Lab Results  Component Value Date   CREATININE 4.40 (H) 06/18/2021   CREATININE 4.83 (H) 06/17/2021   CREATININE 4.69 (H) 06/16/2021   Physical Exam Vitals and nursing note reviewed.  Constitutional:      General: She is not in acute distress.    Appearance: Normal appearance. She is not ill-appearing.  HENT:     Head: Normocephalic and atraumatic.  Cardiovascular:     Rate and Rhythm: Normal rate and regular rhythm.     Heart sounds: Normal heart sounds.  Pulmonary:     Effort: Pulmonary effort is normal. No respiratory distress.     Breath sounds: Normal breath sounds. No wheezing or rales.  Abdominal:     General: There is no distension.     Palpations: Abdomen is soft.     Tenderness: There is no abdominal tenderness.   Musculoskeletal:        General: No tenderness.     Cervical back: Normal range of motion and neck supple.     Right lower leg: Edema (trace) present.     Left lower leg: Edema (trace) present.  Skin:    General: Skin is warm and dry.  Neurological:     General: No focal deficit present.     Mental Status: She is alert and oriented to person, place, and time.  Psychiatric:        Mood and Affect: Mood normal.        Behavior: Behavior normal.        Thought Content: Thought content normal.    Assessment & Plan:  1: Chronic heart failure with preserved ejection fraction with structural changes (LVH)-  - NYHA class III - euvolemic today - not weighing daily; encouraged to resume daily weighing and reminded to call for an overnight weight gain of >2 pounds or a weekly weight gain of >5 pounds - weight down 3 pounds (218.8) from last visit here 1 month ago - not adding salt and has been using Mrs. Dash for seasoning. - hasn't been wearing compression socks consistently and instructed to put them on every morning with removal at bedtime - does try to elevate her legs during the day - saw vascular (Schnier) 12/16/20 - BNP 04/25/19 was 275.0  2: HTN- - BP mildly elevated (135/83) - sees PCP [place] from 03/05/21 reviewed and showed sodium 143, potassium 3.6, creatinine 3.09 and GFR 16  3: COPD- - saw pulmonology Lanney Gins) 02/25/21 - wearing oxygen at 4L around the clock at home; did not bring with her today  4: Obstructive sleep apnea- - wearing CPAP nightly - continues nuvigil and she says that she feels better since resuming it although still gets quite fatigued, just not as sleepy  5: DM with CKD- - fasting glucose at home today was 137 - saw endocrinologist Renne Crigler) 01/23/21 - A1c 09/19/20 was 6.8% - saw nephrology Percell Miller) 03/18/21   Medication bottles reviewed.   Return in 6 months, sooner if needed.

## 2021-09-25 ENCOUNTER — Encounter: Payer: Self-pay | Admitting: Family

## 2021-09-25 ENCOUNTER — Ambulatory Visit: Payer: Medicaid Other | Attending: Family | Admitting: Family

## 2021-09-25 VITALS — BP 174/89 | HR 95 | Resp 16 | Ht 62.0 in | Wt 230.1 lb

## 2021-09-25 DIAGNOSIS — N184 Chronic kidney disease, stage 4 (severe): Secondary | ICD-10-CM

## 2021-09-25 DIAGNOSIS — G4733 Obstructive sleep apnea (adult) (pediatric): Secondary | ICD-10-CM | POA: Insufficient documentation

## 2021-09-25 DIAGNOSIS — J45909 Unspecified asthma, uncomplicated: Secondary | ICD-10-CM | POA: Insufficient documentation

## 2021-09-25 DIAGNOSIS — E1122 Type 2 diabetes mellitus with diabetic chronic kidney disease: Secondary | ICD-10-CM

## 2021-09-25 DIAGNOSIS — J449 Chronic obstructive pulmonary disease, unspecified: Secondary | ICD-10-CM | POA: Diagnosis not present

## 2021-09-25 DIAGNOSIS — E079 Disorder of thyroid, unspecified: Secondary | ICD-10-CM | POA: Diagnosis not present

## 2021-09-25 DIAGNOSIS — Z87891 Personal history of nicotine dependence: Secondary | ICD-10-CM | POA: Diagnosis not present

## 2021-09-25 DIAGNOSIS — I130 Hypertensive heart and chronic kidney disease with heart failure and stage 1 through stage 4 chronic kidney disease, or unspecified chronic kidney disease: Secondary | ICD-10-CM | POA: Diagnosis present

## 2021-09-25 DIAGNOSIS — N189 Chronic kidney disease, unspecified: Secondary | ICD-10-CM | POA: Insufficient documentation

## 2021-09-25 DIAGNOSIS — I10 Essential (primary) hypertension: Secondary | ICD-10-CM | POA: Diagnosis not present

## 2021-09-25 DIAGNOSIS — G4730 Sleep apnea, unspecified: Secondary | ICD-10-CM | POA: Diagnosis not present

## 2021-09-25 DIAGNOSIS — E785 Hyperlipidemia, unspecified: Secondary | ICD-10-CM | POA: Insufficient documentation

## 2021-09-25 DIAGNOSIS — I5032 Chronic diastolic (congestive) heart failure: Secondary | ICD-10-CM | POA: Diagnosis not present

## 2021-09-25 DIAGNOSIS — Z8673 Personal history of transient ischemic attack (TIA), and cerebral infarction without residual deficits: Secondary | ICD-10-CM | POA: Insufficient documentation

## 2021-09-25 DIAGNOSIS — Z794 Long term (current) use of insulin: Secondary | ICD-10-CM

## 2021-09-25 NOTE — Patient Instructions (Addendum)
Resume weighing daily and call for an overnight weight gain of 3 pounds or more or a weekly weight gain of more than 5 pounds.  °

## 2021-09-30 ENCOUNTER — Encounter: Payer: Self-pay | Admitting: Internal Medicine

## 2021-09-30 ENCOUNTER — Ambulatory Visit (INDEPENDENT_AMBULATORY_CARE_PROVIDER_SITE_OTHER): Payer: Medicaid Other | Admitting: Internal Medicine

## 2021-09-30 VITALS — BP 128/68 | HR 86 | Ht 62.0 in | Wt 228.8 lb

## 2021-09-30 DIAGNOSIS — E041 Nontoxic single thyroid nodule: Secondary | ICD-10-CM

## 2021-09-30 DIAGNOSIS — E1159 Type 2 diabetes mellitus with other circulatory complications: Secondary | ICD-10-CM

## 2021-09-30 DIAGNOSIS — E1165 Type 2 diabetes mellitus with hyperglycemia: Secondary | ICD-10-CM

## 2021-09-30 DIAGNOSIS — E785 Hyperlipidemia, unspecified: Secondary | ICD-10-CM

## 2021-09-30 DIAGNOSIS — E0590 Thyrotoxicosis, unspecified without thyrotoxic crisis or storm: Secondary | ICD-10-CM

## 2021-09-30 LAB — POCT GLYCOSYLATED HEMOGLOBIN (HGB A1C): Hemoglobin A1C: 6.7 % — AB (ref 4.0–5.6)

## 2021-09-30 NOTE — Progress Notes (Addendum)
Patient ID: Emily [name], female   DOB: 1960-04-29, 61 y.o.   MRN: [id number]  HPI  Emily [name] is a 61 y.o.-year-old female, returning for f/u for multinodular goiter, thyrotoxicosis, and DM2, uncontrolled, insulin-dependent, with complications (CKD stage 3, cerebro-vascular ds. - s/p stroke). Last visit 4 months ago.  Interim history: No increased urination, blurry vision, nausea, chest pain. Also, no neck compression symptoms. At last visit I again strongly advised her to stop sodas.  She is drinking them occasionally. Since last visit, she was admitted in 06/2021 with CHF exacerbation.  She feels better now, without shortness of breath or chest pain. She does have wheezing. She will start HD soon.  TMNG/Graves ds.: Reviewed and addended history: Pt was dx'ed with hyperthyroidism in ~2014 >> started MMI, lately 10 mg 2x daily. She was lost for f/u at [facility] as she could not travel over there.  Thyroid uptake and scan (06/22/2013) showed: FINDINGS: The 24 hour uptake was 24.7%. The normal reference range at 24 hours is 15 to 35%.   The thyroid scan demonstrates symmetric, mildly heterogenous distribution of radiopharmaceutical throughout the thyroid, which is increased in size.  IMPRESSION: Mildly heterogenous iodine uptake, cannot exclude toxic multinodular goiter versus possible mild Graves disease. Consider ultrasound for further correlation.  Patient was on methimazole before, however, this was stopped after  TSH returned very elevated, in the 90s (02/2015) >> TSH decreased to 8, then normalized >> we continued her off MMI.   However,she was started on MMI 10 mg 2x a day (re-Rx by Dr. Rosanna Randy: 04/17/2016, [facility]) - I do not think he was aware that we had stopped MMI.  We stopped methimazole again in 05/2016.  However, we had to restart again methimazole in 05/2017.  She was on 5 mg daily, but this was stopped during the hospitalization for COPD and CHF  exacerbation 02/2018.  In 12/2018 we had to restart methimazole 5 mg daily.  In 09/2019, we called her to decrease the dose to 2.5 mg daily, but she did not get the msg >> still on 5 mg daily.  In 12/2019, we decreased the methimazole dose to 2.5 mg daily.  In 09/2020, we stopped methimazole.  In 12/2020, we restarted methimazole 5 mg daily.  Reviewed her TFTs: Lab Results  Component Value Date   TSH 0.56 01/09/2021   TSH 0.05 (L) 12/05/2020   TSH 0.21 (L) 10/29/2020   TSH 1.44 09/19/2020   TSH 2.12 03/12/2020   TSH 0.49 02/02/2020   TSH 3.80 12/12/2019   TSH 4.37 10/03/2019   TSH 2.99 06/13/2019   TSH 2.09 03/06/2019   FREET4 0.63 01/09/2021   FREET4 0.83 12/05/2020   FREET4 0.82 10/29/2020   FREET4 0.59 (L) 09/19/2020   FREET4 0.61 03/12/2020   FREET4 0.80 02/02/2020   FREET4 0.70 12/12/2019   FREET4 0.57 (L) 10/03/2019   FREET4 0.66 06/13/2019   FREET4 0.75 03/06/2019   Previously:   Thyroid U/S (05/12/2016): Large isthmic nodule Thyroid tissue is diffusely heterogeneous and enlarged.  The isthmus has a large isoechoic nodular structure. Unclear if this represents a discrete nodule versus focal heterogeneous thyroid tissue. This area measures up to 3.3 cm and recommend biopsy of this area.  Numerous small nodules and cysts throughout the left and right thyroid lobes. These predominantly cystic nodules do not meet criteria for biopsy or dedicated follow-up.  FNA of the isthmic nodule (06/25/2016): Benign  Neck U/S (05/07/2020): Parenchymal Echotexture: Moderately heterogenous Isthmus: 0.9 cm thickness, previously  1.2 Right lobe: 9.9 x 3.9 x 3.4 cm, previously 8.4 x 2.8 x 4.5 Left lobe: 8.6 x 3 x 3.3 cm, previously 8.2 x 3 x 3.7 _________________________________________________________  Nodule # 1: 3.6 x 3.4 x 1.7 cm isthmic nodule, previously 3.3 x 2.7 x 1.7; this was previously biopsied.  Nodule # 2: Prior biopsy: No Location: Right; superior Maximum  size: 1.2 cm; Other 2 dimensions: 0.8 x 0.7 cm, previously, 1 x 0.9 x 0.7 cm Composition: solid/almost completely solid (2) Echogenicity: hypoechoic (2) *Given size (>/= 1 - 1.4 cm) and appearance, a follow-up ultrasound in 1 year should be considered based on TI-RADS criteria. _________________________________________________________  Nodule # 3: Prior biopsy: No Location: Right; superior posterior Maximum size: 1.6 cm; Other 2 dimensions: 1.4 x 1 cm, previously, 1.6 x 1.3 x 1.3 cm Composition: mixed cystic and solid (1) Echogenicity: hypoechoic (2) *Given size (>/= 1.5 - 2.4 cm) and appearance, a follow-up ultrasound in 1 year should be considered based on TI-RADS criteria. _________________________________________________________  Nodule # 4: 1.3 cm benign colloid cyst, mid right  Nodule # 5: 1.1 cm benign colloid cyst, inferior medial right  Nodule # 6: 1.1 cm benign colloid cyst, inferior  right  Nodule # 7: Prior biopsy: No Location: Left; mid Maximum size: 2.2 cm; Other 2 dimensions: 1.4 x 0.8 cm, previously, 0.9 x 0.8 x 0.7 cm Composition: mixed cystic and solid (1) Echogenicity: hypoechoic (2) Significant change in size (>/= 20% in two dimensions and minimal increase of 2 mm): Yes *Given size (>/= 1.5 - 2.4 cm) and appearance, a follow-up ultrasound in 1 year should be considered based on TI-RADS criteria. _________________________________________________________  Nodule # 8: Prior biopsy: No Location: Left; inferior Maximum size: 1.3 cm; Other 2 dimensions: 1.1 x 0.5 cm, previously, 1.3 x 1 x 0.7 cm Composition: spongiform (0) Echogenicity: hypoechoic (2) This nodule does NOT meet TI-RADS criteria for biopsy or dedicated follow-up. ________________________________________________________  Nodule # 9: 1.3 cm spongiform nodule, inferior left, previously 2 cm; This nodule does NOT meet TI-RADS criteria for biopsy or dedicated follow-up.  Nodule # 10: 1.1 cm  complex cyst without calcifications, inferior left; This nodule does NOT meet TI-RADS criteria for biopsy or dedicated follow-up.  IMPRESSION: 1. Thyromegaly with bilateral nodules. None currently meets criteria for biopsy. 2. Recommend annual/biennial ultrasound follow-up of nodules as above, until stability x5 years confirmed.  Pt denies: - feeling nodules in neck - hoarseness - dysphagia - choking  Pt does have a FH of thyroid ds.: aunt. No FH of thyroid cancer. No h/o radiation tx to head or neck. No herbal supplements. No Biotin use. No recent steroids use.   DM2: She has a history of noncompliance with medications. She had several complications: History of stroke, also CKD, DR.  Reviewed HbA1c levels: Lab Results  Component Value Date   HGBA1C 7.2 (A) 05/20/2021   HGBA1C 10.5 (A) 01/23/2021   HGBA1C 6.8 (A) 09/19/2020  10/03/2019: HbA1c 7.4% but HbA1c calculated from fructosamine is 5.4%.  She was on: - Lantus 45 units 2x a day >> was off insulin - "the pharmacy did not receive it" - Januvia 50 mg in am >> stopped (?) - Glipizide 10 mg 2x a day >> started in the hospital We added Jardiance at last visit >> not taking  - "the pharmacy did not receive it" She was on Metformin >> diarrhea.  Now on: - Ozempic 0.5 mg weekly restarted 07/2019 >> 1 mg weekly  - Lantus 50 >>  30 units twice a day >> 35 units twice a day >> 20 units at night (decreased discharge from the hospital in 06/2021) - NovoLog 16 units before breakfast and  20 units before dinner >> 18-20 units before breakfast and 22-24 units before dinner >>  20-24 units before breakfast and 24-28 units before dinner  >> 24 units in am and 28 before dinner We had to start Jardiance 10 mg 02/2018 due to decreased kidney function. Previously on Trulicity.  She is checking sugars twice a day: - am:  66, 74-175, HI >> 67, 84-172, 194, 205, 229 - 2h after b'fast: n/c - lunch: n/c - 2h after lunch: n/c >> 178 >> 121 >>  128 >> n/c - dinner: 83-136 >> 165 >> n/c>> 124-161 >> 126, 156 - 2h after dinner:  111-140 >> 144 >> 95-166 >> 83-124 - bedtime: 156-205 >> 148-224, HI >> 134-179, 239 - nighttime:73-254 >> 150-HI>> 102-HI >> 92-223, 242 Lowest: 82 >> 77 >> 117 >> 66 >> 80s Highest: 381 >> 226 >> 381 >> HI >> 242  + CKD; latest BUN/Cr:  Ref Range & Units 05/08/2021 Comments  Sodium 135 - 145 mmol/L 142    Potassium 3.4 - 4.8 mmol/L 3.8    Chloride 98 - 107 mmol/L 109 High     CO2 20.0 - 31.0 mmol/L 26.0    Anion Gap 5 - 14 mmol/L 7    BUN 9 - 23 mg/dL 44 High     Creatinine 0.60 - 0.80 mg/dL 3.61 High     BUN/Creatinine Ratio  12    eGFR CKD-EPI (2021) Female >=60 mL/min/1.24m 14 Low      07/10/2020: 32/3.02, GFR 17 Lab Results  Component Value Date   BUN 99 (H) 06/18/2021   Lab Results  Component Value Date   CREATININE 4.40 (H) 06/18/2021  GFR 24 (02/18/2018)  + HL; latest lipid panel: Lab Results  Component Value Date   CHOL 145 09/19/2020   HDL 43.60 09/19/2020   LDLCALC 67 07/27/2019   LDLDIRECT 74.0 09/19/2020   TRIG 212.0 (H) 09/19/2020   CHOLHDL 3 09/19/2020  On Lipitor 40.  Latest dilated eye exam was in 2022: + DR reportedly.  Last foot exam 01/2021.  ROS: + See HPI  I reviewed pt's medications, allergies, PMH, social hx, family hx, and changes were documented in the history of present illness. Otherwise, unchanged from my initial visit note.  Past Medical History:  Diagnosis Date   Arthritis    hands, all over   Asthma    CHF (congestive heart failure) (HCC)    COPD (chronic obstructive pulmonary disease) (HCC)    on 2L home o2   Diabetes mellitus without complication (HCC)    type 2   Dyspnea    Hyperlipidemia    Hypertension    Sleep apnea    CPAP   Stroke ([facility]    Was told at recent hospital visit she has stroke in past   Thyroid disease    Thyroid nodule 04/30/2016   Past Surgical History:  Procedure Laterality Date   arm surgery Left     COLONOSCOPY WITH PROPOFOL N/A 02/14/2019   Procedure: COLONOSCOPY WITH PROPOFOL;  Surgeon: WLucilla Lame MD;  [facility];  Service: Endoscopy;  Laterality: N/A;   Social History   Social History   Marital Status: Single    Spouse Name: N/A   Number of Children: 1   Occupational History   None   Social History  Main Topics   Smoking status: Current Every Day Smoker -- 1.00 packs/day   Smokeless tobacco: Not on file   Alcohol Use: No   Drug Use: No   Current Outpatient Medications on File Prior to Visit  Medication Sig Dispense Refill   ACCU-CHEK GUIDE test strip USE TO CHECK BLOOD SUGAR THREE TIMES DAILY 200 each prn   Accu-Chek Softclix Lancets lancets USE TO CHECK BLOOD SUGAR THREE TIMES DAILY AS INSTRUCTED 300 each 3   albuterol (PROVENTIL HFA;VENTOLIN HFA) 108 (90 Base) MCG/ACT inhaler Inhale 1-2 puffs into the lungs every 6 (six) hours as needed for wheezing or shortness of breath. 1 Inhaler 11   albuterol (PROVENTIL) (2.5 MG/3ML) 0.083% nebulizer solution Take 3 mLs (2.5 mg total) by nebulization every 6 (six) hours as needed for wheezing or shortness of breath. 100 vial 0   amLODipine (NORVASC) 5 MG tablet Take 5 mg by mouth daily.     Armodafinil (NUVIGIL) 50 MG tablet Take 1 tablet by mouth daily.     aspirin EC 81 MG tablet Take 81 mg by mouth daily.     atorvastatin (LIPITOR) 40 MG tablet TAKE ONE TABLET BY MOUTH EVERY DAY (Patient taking differently: Take 40 mg by mouth at bedtime.) 90 tablet PRN   BD PEN NEEDLE MICRO U/F 32G X 6 MM MISC SMARTSIG:Pre-Filled Pen Syringe Injection 3 Times Daily     Blood Glucose Monitoring Suppl (ACCU-CHEK GUIDE) w/Device KIT Use as instructed to check blood sugar 3 times a day. E11.59 1 kit 0   budesonide-formoterol (SYMBICORT) 160-4.5 MCG/ACT inhaler Inhale 2 puffs into the lungs 2 (two) times daily.     carvedilol (COREG) 25 MG tablet Take 1 tablet by mouth twice daily (Patient taking differently: Take 25 mg by mouth 2 (two)  times daily with a meal.) 180 tablet 3   cetirizine (ZYRTEC) 10 MG tablet Take 10 mg by mouth daily.     esomeprazole (NEXIUM) 40 MG capsule Take 40 mg by mouth daily. am     fluticasone (FLONASE) 50 MCG/ACT nasal spray Place 1 spray into both nostrils daily.     insulin glargine (LANTUS SOLOSTAR) 100 UNIT/ML Solostar Pen Inject 20 Units into the skin at bedtime. INJECT 30 UNITS SUBCUTANEOUSLY IN THE MORNING AND 30 UNITS AT NIGHT 45 mL 3   ipratropium (ATROVENT) 0.06 % nasal spray Place 2 sprays into both nostrils 4 (four) times daily.     methimazole (TAPAZOLE) 5 MG tablet Take 1 tablet (5 mg total) by mouth daily. 90 tablet 3   NOVOLOG FLEXPEN 100 UNIT/ML FlexPen Inject 6 Units into the skin 3 (three) times daily with meals. Inject 18-24 units 2x a day before meals 45 mL 3   potassium chloride (KLOR-CON) 10 MEQ tablet Take 20 mEq by mouth daily.     Semaglutide, 1 MG/DOSE, (OZEMPIC, 1 MG/DOSE,) 4 MG/3ML SOPN Inject 1 mg into the skin once a week. (Patient taking differently: Inject 1 mg into the skin once a week.) 9 mL 3   tiotropium (SPIRIVA) 18 MCG inhalation capsule Place 18 mcg into inhaler and inhale daily.     Vitamin D, Ergocalciferol, (DRISDOL) 1.25 MG (50000 UNIT) CAPS capsule Take 50,000 Units by mouth every 7 (seven) days.     No current facility-administered medications on file prior to visit.   No Known Allergies Family History  Problem Relation Age of Onset   Diabetes Mother    Hypertension Mother    Diabetes Father    Hypertension Father  CAD Father    Diabetes Sister    PE: There were no vitals taken for this visit. There is no height or weight on file to calculate BMI.  Wt Readings from Last 3 Encounters:  09/25/21 230 lb 2 oz (104.4 kg)  08/07/21 219 lb 12.8 oz (99.7 kg)  06/14/21 218 lb 4.1 oz (99 kg)   Constitutional: overweight, in NAD Eyes: EOMI, no exophthalmos ENT:  +L>R thyromegaly, no cervical lymphadenopathy Cardiovascular: Tachycardia, RR, No MRG, +  mild L>R LE edema Respiratory: CTA B Musculoskeletal: no deformities Skin: moist, warm, no rashes Neurological: no tremor with outstretched hands  ASSESSMENT: 1. Thyrotoxicosis  2. Large multinodular goiter  3. DM2, insulin dependent, uncontrolled  4. HL  PLAN:  1. And 2.  -Patient with history of thyrotoxicosis, either due to mild Graves' disease or mild toxic multinodular goiter per review of her thyroid uptake and scan report -TFTs were normal on methimazole 2.5 mg daily so I advised her to stop the methimazole, however, TSH became suppressed again and we restarted the methimazole back in 12/2020, at 5 mg daily.  She continues on this dose.   -repeat TFTs were normal in 01/2021 -We cannot repeat her TFTs today because she got the Solu-Medrol injection few days ago.  We will repeat these at next visit  2. Large goiter -She continues to have some neck compression symptoms but minimal, not bothersome -The dominant thyroid nodule was biopsied in 2018 with benign results.  This was stable on the latest ultrasound from 05/2020 -She is due for another thyroid ultrasound-we will order it now  3. DM2 -Patient with longstanding, uncontrolled, type 2 diabetes, on basal/bolus insulin regimen and weekly GLP-1 receptor agonist, with significant improvement at last visit, when HbA1c decreased from 10.5% to 7.2%.  At that time, she mentioned that she did decrease her intake of regular soda and I strongly advised her to stop this completely.  Sugars were mostly at goal so we did not change her regimen. -At today's visit, sugars are mostly at or slightly above goal but with hyperglycemic excursions especially in the morning after dinner.  Sugars 2 hours after dinner are at goal, but she has hyperglycemic spikes at bedtime, possibly due to snacks. -Her Lantus dose was decreased drastically in the hospital.  Sugars in the morning are mostly slightly above goal so I advised her to increase the dose of  Lantus.  In the meantime, to reduce the variability of her blood sugars, I advised her to vary the dose of NovoLog based on the size of her meals.  We will continue Ozempic for now.  Upon her questioning, I advised her to continue Ozempic even after she starts dialysis, since this is not renally eliminated. - I advised her to: Patient Instructions  Please increase: - Lantus 25 units at bedtime  Change: - NovoLog 20-24 units before breakfast and 24-28 units before dinner   Continue: - Ozempic 1 mg weekly  Continue methimazole 5 mg daily.  We will order a new thyroid U/S for you.  Please return in 4 months with your sugar log.   - we checked her HbA1c: 6.7% (lower) - advised to check sugars at different times of the day - 2-3x a day, rotating check times - advised for yearly eye exams >> she is UTD - return to clinic in 4 months  4. HL -Reviewed latest lipid panel: Fractions at goal with the exception of a high triglyceride level: Lab Results  Component Value  Date   CHOL 145 09/19/2020   HDL 43.60 09/19/2020   LDLCALC 67 07/27/2019   LDLDIRECT 74.0 09/19/2020   TRIG 212.0 (H) 09/19/2020   CHOLHDL 3 09/19/2020  -She continues on atorvastatin 40 mg daily without side effects -will check a lipid panel at next visit  Thyroid U/S (10/13/2021): Parenchymal Echotexture: Mildly heterogenous  Isthmus: Enlarged measuring 1 cm in diameter, unchanged  Right lobe: Enlarged measuring 8.8 x 3.9 x 3.6 cm, previously, 9.9 x 3.9 x 3.4 cm  Left lobe: Enlarged measuring 6.4 x 3.7 x 3.7 cm, previously, 8.6 x 3.3 x 3.0 cm  _________________________________________________________   Estimated total number of nodules >/= 1 cm: >10 _________________________________________________________   The previously biopsied 3.3 x 2.6 x 2.2 cm isoechoic ill-defined nodule/pseudonodule within the thyroid isthmus (labeled 1), is unchanged compared to the 05/2016 examination, previously, 3.3 x 2.7 x 1.7 cm.  Correlation previous biopsy results is advised.  _________________________________________________________   Punctate (sub 1.5 cm) cystic, spongiform/benign and mixed cystic nodules scattered throughout the right lobe of the thyroid, many of which are unchanged compared to the 2018 examination, and none meet imaging criteria to recommend percutaneous sampling or continued dedicated follow-up.  _________________________________________________________   There is an approximately 2.1 x 1.4 x 1.2 cm spongiform/benign-appearing nodule within the mid aspect of the left lobe of the thyroid (labeled 7), which is unchanged compared to the 05/2020 examination and again does not meet criteria to recommend percutaneous sampling or continued dedicated follow-up.   Additional scattered punctate (sub 1.3 cm) spongiform/benign-appearing nodules within the left lobe of the thyroid, many of which are unchanged compared to the 2018 examination, do not meet imaging criteria to recommend percutaneous sampling or continued dedicated follow-up.   IMPRESSION: 1. Similar findings of thyromegaly and multinodular goiter. No worrisome new or enlarging thyroid nodules. 2. Previously biopsied isthmic nodule/pseudonodule is unchanged compared to the 2018 examination. Correlation with previous biopsy results is advised. Additionally, imaging stability for greater than 5 years is indicative of a benign etiology. 3. None of the additional discretely measured nodules/cysts, many of which appear unchanged compared to the 2018 examination, meet imaging criteria to recommend percutaneous sampling or continued dedicated follow-up.  Philemon Kingdom, MD PhD [facility] Endocrinology

## 2021-09-30 NOTE — Patient Instructions (Addendum)
Please increase: - Lantus 25 units at bedtime  Change: - NovoLog 20-24 units before breakfast and 24-28 units before dinner   Continue: - Ozempic 1 mg weekly  Continue methimazole 5 mg daily.  We will order a new thyroid U/S for you.  Please return in 4 months with your sugar log.

## 2021-10-12 ENCOUNTER — Other Ambulatory Visit: Payer: Self-pay | Admitting: Internal Medicine

## 2021-10-12 DIAGNOSIS — E1165 Type 2 diabetes mellitus with hyperglycemia: Secondary | ICD-10-CM

## 2021-10-13 ENCOUNTER — Ambulatory Visit
Admission: RE | Admit: 2021-10-13 | Discharge: 2021-10-13 | Disposition: A | Discharge status: Home / Self Care | Payer: Medicaid Other | Source: Ambulatory Visit | Attending: Internal Medicine | Admitting: Internal Medicine

## 2021-10-13 DIAGNOSIS — E041 Nontoxic single thyroid nodule: Secondary | ICD-10-CM

## 2021-10-17 ENCOUNTER — Other Ambulatory Visit: Payer: Self-pay

## 2021-10-17 ENCOUNTER — Emergency Department: Payer: Medicaid Other

## 2021-10-17 ENCOUNTER — Inpatient Hospital Stay
Admission: EM | Admit: 2021-10-17 | Discharge: 2021-10-19 | DRG: 190 | Disposition: A | Discharge status: Home / Self Care | Payer: Medicaid Other | Attending: Internal Medicine | Admitting: Internal Medicine

## 2021-10-17 DIAGNOSIS — E108 Type 1 diabetes mellitus with unspecified complications: Secondary | ICD-10-CM

## 2021-10-17 DIAGNOSIS — N2581 Secondary hyperparathyroidism of renal origin: Secondary | ICD-10-CM | POA: Diagnosis present

## 2021-10-17 DIAGNOSIS — Z6839 Body mass index (BMI) 39.0-39.9, adult: Secondary | ICD-10-CM

## 2021-10-17 DIAGNOSIS — I10 Essential (primary) hypertension: Secondary | ICD-10-CM | POA: Diagnosis not present

## 2021-10-17 DIAGNOSIS — Z7982 Long term (current) use of aspirin: Secondary | ICD-10-CM | POA: Diagnosis not present

## 2021-10-17 DIAGNOSIS — Z20822 Contact with and (suspected) exposure to covid-19: Secondary | ICD-10-CM | POA: Diagnosis present

## 2021-10-17 DIAGNOSIS — Z833 Family history of diabetes mellitus: Secondary | ICD-10-CM

## 2021-10-17 DIAGNOSIS — E0590 Thyrotoxicosis, unspecified without thyrotoxic crisis or storm: Secondary | ICD-10-CM | POA: Diagnosis present

## 2021-10-17 DIAGNOSIS — E1151 Type 2 diabetes mellitus with diabetic peripheral angiopathy without gangrene: Secondary | ICD-10-CM | POA: Diagnosis present

## 2021-10-17 DIAGNOSIS — E669 Obesity, unspecified: Secondary | ICD-10-CM | POA: Diagnosis not present

## 2021-10-17 DIAGNOSIS — E785 Hyperlipidemia, unspecified: Secondary | ICD-10-CM | POA: Diagnosis present

## 2021-10-17 DIAGNOSIS — D631 Anemia in chronic kidney disease: Secondary | ICD-10-CM | POA: Diagnosis present

## 2021-10-17 DIAGNOSIS — J9611 Chronic respiratory failure with hypoxia: Secondary | ICD-10-CM | POA: Diagnosis present

## 2021-10-17 DIAGNOSIS — Z8673 Personal history of transient ischemic attack (TIA), and cerebral infarction without residual deficits: Secondary | ICD-10-CM

## 2021-10-17 DIAGNOSIS — Z794 Long term (current) use of insulin: Secondary | ICD-10-CM

## 2021-10-17 DIAGNOSIS — J181 Lobar pneumonia, unspecified organism: Secondary | ICD-10-CM | POA: Diagnosis present

## 2021-10-17 DIAGNOSIS — E162 Hypoglycemia, unspecified: Principal | ICD-10-CM | POA: Diagnosis present

## 2021-10-17 DIAGNOSIS — J449 Chronic obstructive pulmonary disease, unspecified: Secondary | ICD-10-CM | POA: Diagnosis not present

## 2021-10-17 DIAGNOSIS — D638 Anemia in other chronic diseases classified elsewhere: Secondary | ICD-10-CM

## 2021-10-17 DIAGNOSIS — Z79899 Other long term (current) drug therapy: Secondary | ICD-10-CM

## 2021-10-17 DIAGNOSIS — I5032 Chronic diastolic (congestive) heart failure: Secondary | ICD-10-CM | POA: Diagnosis present

## 2021-10-17 DIAGNOSIS — Z8249 Family history of ischemic heart disease and other diseases of the circulatory system: Secondary | ICD-10-CM

## 2021-10-17 DIAGNOSIS — E1122 Type 2 diabetes mellitus with diabetic chronic kidney disease: Secondary | ICD-10-CM | POA: Diagnosis present

## 2021-10-17 DIAGNOSIS — N185 Chronic kidney disease, stage 5: Secondary | ICD-10-CM | POA: Diagnosis present

## 2021-10-17 DIAGNOSIS — G4733 Obstructive sleep apnea (adult) (pediatric): Secondary | ICD-10-CM | POA: Diagnosis present

## 2021-10-17 DIAGNOSIS — E1159 Type 2 diabetes mellitus with other circulatory complications: Secondary | ICD-10-CM

## 2021-10-17 DIAGNOSIS — J189 Pneumonia, unspecified organism: Secondary | ICD-10-CM | POA: Diagnosis present

## 2021-10-17 DIAGNOSIS — E11649 Type 2 diabetes mellitus with hypoglycemia without coma: Secondary | ICD-10-CM | POA: Diagnosis present

## 2021-10-17 DIAGNOSIS — Z9981 Dependence on supplemental oxygen: Secondary | ICD-10-CM | POA: Diagnosis not present

## 2021-10-17 DIAGNOSIS — Z87891 Personal history of nicotine dependence: Secondary | ICD-10-CM | POA: Diagnosis not present

## 2021-10-17 DIAGNOSIS — I132 Hypertensive heart and chronic kidney disease with heart failure and with stage 5 chronic kidney disease, or end stage renal disease: Secondary | ICD-10-CM | POA: Diagnosis present

## 2021-10-17 DIAGNOSIS — J440 Chronic obstructive pulmonary disease with acute lower respiratory infection: Principal | ICD-10-CM | POA: Diagnosis present

## 2021-10-17 DIAGNOSIS — Z7951 Long term (current) use of inhaled steroids: Secondary | ICD-10-CM

## 2021-10-17 HISTORY — DX: Pneumonia, unspecified organism: J18.9

## 2021-10-17 LAB — CBC WITH DIFFERENTIAL/PLATELET
Abs Immature Granulocytes: 0.03 10*3/uL (ref 0.00–0.07)
Basophils Absolute: 0 10*3/uL (ref 0.0–0.1)
Basophils Relative: 0 %
Eosinophils Absolute: 0.2 10*3/uL (ref 0.0–0.5)
Eosinophils Relative: 3 %
HCT: 34.8 % — ABNORMAL LOW (ref 36.0–46.0)
Hemoglobin: 10.1 g/dL — ABNORMAL LOW (ref 12.0–15.0)
Immature Granulocytes: 0 %
Lymphocytes Relative: 16 %
Lymphs Abs: 1.2 10*3/uL (ref 0.7–4.0)
MCH: 27.4 pg (ref 26.0–34.0)
MCHC: 29 g/dL — ABNORMAL LOW (ref 30.0–36.0)
MCV: 94.6 fL (ref 80.0–100.0)
Monocytes Absolute: 0.7 10*3/uL (ref 0.1–1.0)
Monocytes Relative: 8 %
Neutro Abs: 5.8 10*3/uL (ref 1.7–7.7)
Neutrophils Relative %: 73 %
Platelets: 237 10*3/uL (ref 150–400)
RBC: 3.68 MIL/uL — ABNORMAL LOW (ref 3.87–5.11)
RDW: 13.3 % (ref 11.5–15.5)
WBC: 8 10*3/uL (ref 4.0–10.5)
nRBC: 0 % (ref 0.0–0.2)

## 2021-10-17 LAB — SARS CORONAVIRUS 2 BY RT PCR: SARS Coronavirus 2 by RT PCR: NEGATIVE

## 2021-10-17 LAB — URINALYSIS, ROUTINE W REFLEX MICROSCOPIC
Bacteria, UA: NONE SEEN
Bilirubin Urine: NEGATIVE
Glucose, UA: 150 mg/dL — AB
Ketones, ur: NEGATIVE mg/dL
Leukocytes,Ua: NEGATIVE
Nitrite: NEGATIVE
Protein, ur: >=300 mg/dL — AB
Specific Gravity, Urine: 1.013 (ref 1.005–1.030)
pH: 5 (ref 5.0–8.0)

## 2021-10-17 LAB — COMPREHENSIVE METABOLIC PANEL
ALT: 9 U/L (ref 0–44)
AST: 11 U/L — ABNORMAL LOW (ref 15–41)
Albumin: 2.9 g/dL — ABNORMAL LOW (ref 3.5–5.0)
Alkaline Phosphatase: 102 U/L (ref 38–126)
Anion gap: 5 (ref 5–15)
BUN: 40 mg/dL — ABNORMAL HIGH (ref 8–23)
CO2: 23 mmol/L (ref 22–32)
Calcium: 8.8 mg/dL — ABNORMAL LOW (ref 8.9–10.3)
Chloride: 116 mmol/L — ABNORMAL HIGH (ref 98–111)
Creatinine, Ser: 4.37 mg/dL — ABNORMAL HIGH (ref 0.44–1.00)
GFR, Estimated: 11 mL/min — ABNORMAL LOW (ref >60)
Glucose, Bld: 110 mg/dL — ABNORMAL HIGH (ref 70–99)
Potassium: 4.6 mmol/L (ref 3.5–5.1)
Sodium: 144 mmol/L (ref 135–145)
Total Bilirubin: 0.5 mg/dL (ref 0.3–1.2)
Total Protein: 6.9 g/dL (ref 6.5–8.1)

## 2021-10-17 LAB — CBG MONITORING, ED
Glucose-Capillary: 94 mg/dL (ref 70–99)
Glucose-Capillary: 169 mg/dL — ABNORMAL HIGH (ref 70–99)
Glucose-Capillary: 178 mg/dL — ABNORMAL HIGH (ref 70–99)
Glucose-Capillary: 158 mg/dL — ABNORMAL HIGH (ref 70–99)

## 2021-10-17 LAB — RESP PANEL BY RT-PCR (FLU A&B, COVID) ARPGX2
Influenza A by PCR: NEGATIVE
Influenza B by PCR: NEGATIVE
SARS Coronavirus 2 by RT PCR: NEGATIVE

## 2021-10-17 LAB — LACTIC ACID, PLASMA: Lactic Acid, Venous: 0.7 mmol/L (ref 0.5–1.9)

## 2021-10-17 MED ORDER — TIOTROPIUM BROMIDE MONOHYDRATE 18 MCG IN CAPS
18.0000 ug | ORAL_CAPSULE | Freq: Every day | RESPIRATORY_TRACT | Status: DC
  Administered 2021-10-18 – 2021-10-19 (×2): 18 ug via RESPIRATORY_TRACT
  Filled 2021-10-17 (×2): qty 5

## 2021-10-17 MED ORDER — IPRATROPIUM-ALBUTEROL 0.5-2.5 (3) MG/3ML IN SOLN
3.0000 mL | Freq: Once | RESPIRATORY_TRACT | Status: AC
  Administered 2021-10-17: 3 mL via RESPIRATORY_TRACT
  Filled 2021-10-17: qty 3

## 2021-10-17 MED ORDER — SODIUM CHLORIDE 0.9% FLUSH
3.0000 mL | Freq: Two times a day (BID) | INTRAVENOUS | Status: DC
  Administered 2021-10-17 – 2021-10-19 (×5): 3 mL via INTRAVENOUS

## 2021-10-17 MED ORDER — MOMETASONE FURO-FORMOTEROL FUM 200-5 MCG/ACT IN AERO
2.0000 | INHALATION_SPRAY | Freq: Two times a day (BID) | RESPIRATORY_TRACT | Status: DC
  Administered 2021-10-18 – 2021-10-19 (×3): 2 via RESPIRATORY_TRACT
  Filled 2021-10-17 (×2): qty 8.8

## 2021-10-17 MED ORDER — PANTOPRAZOLE SODIUM 40 MG PO TBEC: 40.0000 mg | DELAYED_RELEASE_TABLET | Freq: Every day | ORAL | Status: DC

## 2021-10-17 MED ORDER — SODIUM CHLORIDE 0.9 % IV SOLN
2.0000 g | INTRAVENOUS | Status: DC
  Filled 2021-10-17: qty 20

## 2021-10-17 MED ORDER — ASPIRIN 81 MG PO TBEC
81.0000 mg | DELAYED_RELEASE_TABLET | Freq: Every day | ORAL | Status: DC
  Administered 2021-10-18 – 2021-10-19 (×2): 81 mg via ORAL
  Filled 2021-10-17 (×2): qty 1

## 2021-10-17 MED ORDER — LORATADINE 10 MG PO TABS: 10.0000 mg | ORAL_TABLET | Freq: Every day | ORAL | Status: DC

## 2021-10-17 MED ORDER — AMLODIPINE BESYLATE 5 MG PO TABS: 5.0000 mg | ORAL_TABLET | Freq: Every day | ORAL | Status: DC

## 2021-10-17 MED ORDER — HEPARIN SODIUM (PORCINE) 5000 UNIT/ML IJ SOLN
5000.0000 [IU] | Freq: Three times a day (TID) | INTRAMUSCULAR | Status: DC
  Administered 2021-10-17 – 2021-10-19 (×6): 5000 [IU] via SUBCUTANEOUS
  Filled 2021-10-17 (×6): qty 1

## 2021-10-17 MED ORDER — METHIMAZOLE 5 MG PO TABS: 5.0000 mg | ORAL_TABLET | Freq: Every day | ORAL | Status: DC

## 2021-10-17 MED ORDER — SODIUM CHLORIDE 0.9 % IV SOLN
500.0000 mg | Freq: Once | INTRAVENOUS | Status: AC
  Administered 2021-10-17: 500 mg via INTRAVENOUS
  Filled 2021-10-17: qty 5

## 2021-10-17 MED ORDER — SODIUM CHLORIDE 0.9 % IV SOLN: 250.0000 mL | INTRAVENOUS | Status: DC | PRN

## 2021-10-17 MED ORDER — CARVEDILOL 25 MG PO TABS
25.0000 mg | ORAL_TABLET | Freq: Two times a day (BID) | ORAL | Status: DC
  Administered 2021-10-17 – 2021-10-19 (×4): 25 mg via ORAL
  Filled 2021-10-17 (×3): qty 1
  Filled 2021-10-17: qty 4

## 2021-10-17 MED ORDER — ONDANSETRON HCL 4 MG/2ML IJ SOLN: 4.0000 mg | Freq: Four times a day (QID) | INTRAMUSCULAR | Status: DC | PRN

## 2021-10-17 MED ORDER — LORATADINE 10 MG PO TABS
10.0000 mg | ORAL_TABLET | Freq: Every day | ORAL | Status: DC
  Administered 2021-10-17 – 2021-10-19 (×3): 10 mg via ORAL
  Filled 2021-10-17 (×3): qty 1

## 2021-10-17 MED ORDER — AMLODIPINE BESYLATE 5 MG PO TABS
5.0000 mg | ORAL_TABLET | Freq: Every day | ORAL | Status: DC
  Administered 2021-10-17: 5 mg via ORAL
  Filled 2021-10-17 (×2): qty 1

## 2021-10-17 MED ORDER — PANTOPRAZOLE SODIUM 40 MG PO TBEC
40.0000 mg | DELAYED_RELEASE_TABLET | Freq: Every day | ORAL | Status: DC
  Administered 2021-10-17 – 2021-10-19 (×3): 40 mg via ORAL
  Filled 2021-10-17 (×3): qty 1

## 2021-10-17 MED ORDER — ATORVASTATIN CALCIUM 20 MG PO TABS
40.0000 mg | ORAL_TABLET | Freq: Every day | ORAL | Status: DC
  Administered 2021-10-17 – 2021-10-18 (×2): 40 mg via ORAL
  Filled 2021-10-17 (×2): qty 2

## 2021-10-17 MED ORDER — SODIUM CHLORIDE 0.9 % IV SOLN
2.0000 g | Freq: Once | INTRAVENOUS | Status: AC
  Administered 2021-10-17: 2 g via INTRAVENOUS
  Filled 2021-10-17: qty 12.5

## 2021-10-17 MED ORDER — SODIUM CHLORIDE 0.9% FLUSH: 3.0000 mL | INTRAVENOUS | Status: DC | PRN

## 2021-10-17 MED ORDER — ACETAMINOPHEN 650 MG RE SUPP: 650.0000 mg | Freq: Four times a day (QID) | RECTAL | Status: DC | PRN

## 2021-10-17 MED ORDER — ACETAMINOPHEN 325 MG PO TABS: 650.0000 mg | ORAL_TABLET | Freq: Four times a day (QID) | ORAL | Status: DC | PRN

## 2021-10-17 MED ORDER — ONDANSETRON HCL 4 MG PO TABS: 4.0000 mg | ORAL_TABLET | Freq: Four times a day (QID) | ORAL | Status: DC | PRN

## 2021-10-17 MED ORDER — VITAMIN D (ERGOCALCIFEROL) 1.25 MG (50000 UNIT) PO CAPS: 50000.0000 [IU] | ORAL_CAPSULE | ORAL | Status: DC

## 2021-10-17 MED ORDER — ALBUTEROL SULFATE (2.5 MG/3ML) 0.083% IN NEBU: 2.5000 mg | INHALATION_SOLUTION | Freq: Four times a day (QID) | RESPIRATORY_TRACT | Status: DC | PRN

## 2021-10-17 MED ORDER — SODIUM CHLORIDE 0.9 % IV SOLN: 500.0000 mg | INTRAVENOUS | Status: DC

## 2021-10-17 MED ORDER — MODAFINIL 100 MG PO TABS
100.0000 mg | ORAL_TABLET | Freq: Every day | ORAL | Status: DC
  Filled 2021-10-17 (×2): qty 1

## 2021-10-17 NOTE — ED Triage Notes (Signed)
First nurse note- Pt to triage via [facility] from home with c/o hypoglycemia. Type 2 diabetic. Initial blood sugar 52. Received 5 grams oral glucose and  7.75 grams D10

## 2021-10-17 NOTE — Assessment & Plan Note (Signed)
Right lobar pneumonia. On vancomycin and Rocephin while in the hospital.  Blood cultures growing out staph epidermis and strep cristatus.  Since the patient never appeared septic or ill, I believe both of these organisms are skin contamination.  We will give Omnicef and doxycycline upon going home for 4 more days.

## 2021-10-17 NOTE — Assessment & Plan Note (Signed)
BMI 46.00 Complicates overall prognosis and care Lifestyle modification and exercise has been discussed with patient in detail

## 2021-10-17 NOTE — Assessment & Plan Note (Signed)
Patient has stage V chronic kidney disease secondary to diabetes mellitus. She states that her nephrologist has discussed renal replacement therapy with her and she is agreeable We will consult nephrology

## 2021-10-17 NOTE — Assessment & Plan Note (Signed)
Most likely iatrogenic and following insulin administration Patient has a history of insulin-dependent diabetes mellitus with recent medication adjustments made by her endocrinologist. She had an episode of hypoglycemia that improved following administration of oral glucose and D10. Hold long and short acting insulin for now Check blood sugars every 4 hours Patient may likely require lower doses of insulin due to her renal insufficiency

## 2021-10-17 NOTE — Consult Note (Signed)
PHARMACY -  BRIEF ANTIBIOTIC NOTE   Pharmacy has received consult(s) for cefepime from an ED provider.  The patient's profile has been reviewed for ht/wt/allergies/indication/available labs.    One time order(s) placed for cefepime 2 g IV  Further antibiotics/pharmacy consults should be ordered by admitting physician if indicated.                       Thank you,  Dara Hoyer, PharmD PGY-1 Pharmacy Resident 10/17/2021 11:02 AM

## 2021-10-17 NOTE — ED Provider Notes (Signed)
[facility] Provider Note    Event Date/Time   First MD Initiated Contact with Patient 10/17/21 [phone]     (approximate)   History   No chief complaint on file.   HPI  Emily [name] is a 61 y.o. female with a history of CKD not on dialysis as well as type 2 diabetes does take insulin presents to the ER for generalized weakness and low blood sugar that occurred this morning.  Checked her sugar this morning was around 50.  EMS was called.  She was given oral glucose as well as D10 with significant improvement in symptoms she denies any chest pain or shortness of breath she does wear home oxygen 4 L nasal cannula arrived to the ER not on oxygen was found to be hypoxic.  She denies any cough or congestion no dysuria no abdominal pain.  States she did not eat as much as usual last night but she thinks that she took her normal insulin.     Physical Exam   Triage Vital Signs: ED Triage Vitals  Enc Vitals Group     BP 10/17/21 0538 (!) 150/77     Pulse Rate 10/17/21 0538 84     Resp 10/17/21 0538 20     Temp 10/17/21 0538 97.7 F (36.5 C)     Temp Source 10/17/21 0538 Oral     SpO2 10/17/21 0538 (!) 85 %     Weight 10/17/21 0537 230 lb (104.3 kg)     Height 10/17/21 0537 '5\' 4"'$  (1.626 m)     Head Circumference --      Peak Flow --      Pain Score 10/17/21 0541 0     Pain Loc --      Pain Edu? --      Excl. in [place]? --     Most recent vital signs: Vitals:   10/17/21 1230 10/17/21 1300  BP: (!) 175/75 (!) 178/112  Pulse: 88 97  Resp: 17 17  Temp:    SpO2: 98% 100%     Constitutional: Alert  Eyes: Conjunctivae are normal.  Head: Atraumatic. Nose: No congestion/rhinnorhea. Mouth/Throat: Mucous membranes are moist.   Neck: Painless ROM.  Cardiovascular:   Good peripheral circulation. Respiratory: Normal respiratory effort.  No retractions.  Gastrointestinal: Soft and nontender.  Musculoskeletal:  no deformity Neurologic:  MAE spontaneously. No  gross focal neurologic deficits are appreciated.  Skin:  Skin is warm, dry and intact. No rash noted. Psychiatric: Mood and affect are normal. Speech and behavior are normal.    ED Results / Procedures / Treatments   Labs (all labs ordered are listed, but only abnormal results are displayed) Labs Reviewed  CBC WITH DIFFERENTIAL/PLATELET - Abnormal; Notable for the following components:      Result Value   RBC 3.68 (*)    Hemoglobin 10.1 (*)    HCT 34.8 (*)    MCHC 29.0 (*)    All other components within normal limits  COMPREHENSIVE METABOLIC PANEL - Abnormal; Notable for the following components:   Chloride 116 (*)    Glucose, Bld 110 (*)    BUN 40 (*)    Creatinine, Ser 4.37 (*)    Calcium 8.8 (*)    Albumin 2.9 (*)    AST 11 (*)    GFR, Estimated 11 (*)    All other components within normal limits  URINALYSIS, ROUTINE W REFLEX MICROSCOPIC - Abnormal; Notable for the following components:   Color,  Urine YELLOW (*)    APPearance CLEAR (*)    Glucose, UA 150 (*)    Hgb urine dipstick SMALL (*)    Protein, ur >=300 (*)    All other components within normal limits  CBG MONITORING, ED - Abnormal; Notable for the following components:   Glucose-Capillary 169 (*)    All other components within normal limits  SARS CORONAVIRUS 2 BY RT PCR  RESP PANEL BY RT-PCR (FLU A&B, COVID) ARPGX2  CULTURE, BLOOD (ROUTINE X 2)  CULTURE, BLOOD (ROUTINE X 2)  LACTIC ACID, PLASMA  CBG MONITORING, ED     EKG     RADIOLOGY Please see ED Course for my review and interpretation.  I personally reviewed all radiographic images ordered to evaluate for the above acute complaints and reviewed radiology reports and findings.  These findings were personally discussed with the patient.  Please see medical record for radiology report.    PROCEDURES:  Critical Care performed: No  Procedures   MEDICATIONS ORDERED IN ED: Medications  aspirin EC tablet 81 mg (has no administration in time  range)  amLODipine (NORVASC) tablet 5 mg (has no administration in time range)  atorvastatin (LIPITOR) tablet 40 mg (has no administration in time range)  carvedilol (COREG) tablet 25 mg (has no administration in time range)  Armodafinil 50 mg (has no administration in time range)  methimazole (TAPAZOLE) tablet 5 mg (has no administration in time range)  pantoprazole (PROTONIX) EC tablet 40 mg (has no administration in time range)  Vitamin D (Ergocalciferol) (DRISDOL) 1.25 MG (50000 UNIT) capsule 50,000 Units (has no administration in time range)  albuterol (PROVENTIL) (2.5 MG/3ML) 0.083% nebulizer solution 2.5 mg (has no administration in time range)  mometasone-formoterol (DULERA) 200-5 MCG/ACT inhaler 2 puff (has no administration in time range)  loratadine (CLARITIN) tablet 10 mg (has no administration in time range)  tiotropium (SPIRIVA) inhalation capsule ([facility] use ONLY) 18 mcg (has no administration in time range)  cefTRIAXone (ROCEPHIN) 2 g in sodium chloride 0.9 % 100 mL IVPB (has no administration in time range)  heparin injection 5,000 Units (has no administration in time range)  sodium chloride flush (NS) 0.9 % injection 3 mL (has no administration in time range)  sodium chloride flush (NS) 0.9 % injection 3 mL (has no administration in time range)  0.9 %  sodium chloride infusion (has no administration in time range)  acetaminophen (TYLENOL) tablet 650 mg (has no administration in time range)    Or  acetaminophen (TYLENOL) suppository 650 mg (has no administration in time range)  ondansetron (ZOFRAN) tablet 4 mg (has no administration in time range)    Or  ondansetron (ZOFRAN) injection 4 mg (has no administration in time range)  azithromycin (ZITHROMAX) 500 mg in sodium chloride 0.9 % 250 mL IVPB (has no administration in time range)  ceFEPIme (MAXIPIME) 2 g in sodium chloride 0.9 % 100 mL IVPB (0 g Intravenous Stopped 10/17/21 1205)  azithromycin (ZITHROMAX) 500 mg in sodium  chloride 0.9 % 250 mL IVPB (500 mg Intravenous New Bag/Given 10/17/21 1225)  ipratropium-albuterol (DUONEB) 0.5-2.5 (3) MG/3ML nebulizer solution 3 mL (3 mLs Nebulization Given 10/17/21 1059)     IMPRESSION / MDM / ASSESSMENT AND PLAN / ED COURSE  I reviewed the triage vital signs and the nursing notes.                              Differential diagnosis includes, but  is not limited to, hypoglycemia, sepsis, electrolyte abnormality, pneumonia, UTI, medication complication  Patient presenting to the ER for evaluation of symptoms as described above.  Base on symptoms, risk factors and considered above differential, this presenting complaint could reflect a potentially life-threatening illness therefore the patient will be placed on continuous pulse oximetry and telemetry for monitoring.  Laboratory evaluation will be sent to evaluate for the above complaints.      Clinical Course as of 10/17/21 1332  Fri Oct 17, 2021  1024 Chest x-ray on my review and interpretation does show evidence of consolidation right lower lobe no pneumothorax.  Radiology also finds findings consistent with pneumonia and small pleural effusion.  She is not febrile right now no white count but reports that she has been having chills feeling weak and fatigued over the past 48 hours.  This could potentially explain her episodes of hypoglycemia as well.  She has been self titrating her O2 requirement at home up to 5 or 6 over the past 24 hours.  Based on these findings will order blood cultures as well as order IV antibiotics due to concern for pneumonia.  She is high risk with a poor score of 112 recommending hospitalization for further medical management. [PR]  1025 Will consult hospitalist for admission. [PR]    Clinical Course User Index [PR] Merlyn Lot, MD   FINAL CLINICAL IMPRESSION(S) / ED DIAGNOSES   Final diagnoses:  Hypoglycemia  Pneumonia of right lower lobe due to infectious organism     Rx / DC  Orders   ED Discharge Orders     None        Note:  This document was prepared using Dragon voice recognition software and may include unintentional dictation errors.    Merlyn Lot, MD 10/17/21 1332

## 2021-10-17 NOTE — Assessment & Plan Note (Signed)
Patient has a history of COPD with chronic respiratory failure Continue oxygen supplementation Continue as needed bronchodilator therapy as well as inhaled steroids

## 2021-10-17 NOTE — ED Notes (Signed)
1 lt green, 1 purple, 1 blue collected and in lab

## 2021-10-17 NOTE — ED Notes (Signed)
Room cleaned of dinner at this time. Pt given pillow and repositioned.

## 2021-10-17 NOTE — ED Notes (Signed)
Attempted second IV x2 without success- was able to draw blood cultures.

## 2021-10-17 NOTE — ED Notes (Signed)
Pt states her nebulizer is complete at this time, pt placed on 5 liter's [place]. Pt reports she normally uses 4 liter's chronically but had to increase to 5 liter's today.

## 2021-10-17 NOTE — ED Notes (Signed)
Informed RN bed assigned 

## 2021-10-17 NOTE — Assessment & Plan Note (Signed)
Continue oxygen supplementation, her chronic 4 L.

## 2021-10-17 NOTE — H&P (Signed)
History and Physical    Patient: Emily [name] XLK:[id number] DOB: 1960/04/12 DOA: 10/17/2021 DOS: the patient was seen and examined on 10/17/2021 PCP: [facility]  Patient coming from: Home  Chief Complaint: No chief complaint on file.  HPI: Emily [name] is a 61 y.o. female with medical history significant for diabetes mellitus with complications of stage IV chronic kidney disease, obstructive sleep apnea, chronic diastolic dysfunction CHF, COPD with chronic respiratory failure on 4 L of oxygen continuous, hyperthyroidism who presented to the ER via EMS for evaluation of hypoglycemia. Patient EMS patient had blood sugar of 52 and they administered 5 g of oral glucose as well as D10 with improvement in her blood sugar.  Patient states that she ate dinner and took her insulin as recommended and chart review shows that she was recently seen by her endocrinologist who made some adjustments to her insulin. She has had a prior hospitalization for hypoglycemia. She notes that she has had increased wheezing and had to increase her home oxygen from 4 L to 6 L at the recommendation of her primary care provider due to worsening shortness of breath from her baseline.   She denies having any cough, no fever, no chest pain, no dizziness, no lightheadedness, no palpitations, no diaphoresis, no leg swelling, no blurred vision, no abdominal pain, no nausea, no vomiting, no focal deficits, no urinary frequency, no nocturia, no dysuria Patient had a chest x-ray which showed right lower lobe pneumonia with trace right pleural effusion. She will be referred to observation status for further evaluation    Review of Systems: As mentioned in the history of present illness. All other systems reviewed and are negative. Past Medical History:  Diagnosis Date   Arthritis    hands, all over   Asthma    CHF (congestive heart failure) (HCC)    COPD (chronic obstructive pulmonary  disease) (HCC)    on 2L home o2   Diabetes mellitus without complication (HCC)    type 2   Dyspnea    Hyperlipidemia    Hypertension    Sleep apnea    CPAP   Stroke [facility])    Was told at recent hospital visit she has stroke in past   Thyroid disease    Thyroid nodule 04/30/2016   Past Surgical History:  Procedure Laterality Date   arm surgery Left    COLONOSCOPY WITH PROPOFOL N/A 02/14/2019   Procedure: COLONOSCOPY WITH PROPOFOL;  Surgeon: [name], MD;  Location: [facility] ENDOSCOPY;  Service: Endoscopy;  Laterality: N/A;   Social History:  reports that she quit smoking about 5 years ago. Her smoking use included cigarettes. She has a 60.00 pack-year smoking history. She has never used smokeless tobacco. She reports that she does not drink alcohol and does not use drugs.  No Known Allergies  Family History  Problem Relation Age of Onset   Diabetes Mother    Hypertension Mother    Diabetes Father    Hypertension Father    CAD Father    Diabetes Sister     Prior to Admission medications   Medication Sig Start Date End Date Taking? Authorizing Provider  ACCU-CHEK GUIDE test strip USE TO CHECK BLOOD SUGAR THREE TIMES DAILY 01/01/21   Philemon Kingdom, MD  Accu-Chek Softclix Lancets lancets USE TO CHECK BLOOD SUGAR THREE TIMES DAILY AS INSTRUCTED 03/05/21   Philemon Kingdom, MD  albuterol (PROVENTIL HFA;VENTOLIN HFA) 108 (90 Base) MCG/ACT inhaler Inhale 1-2 puffs into the lungs every 6 (six)  hours as needed for wheezing or shortness of breath. 07/16/16   Doles-Johnson, Teah, NP  albuterol (PROVENTIL) (2.5 MG/3ML) 0.083% nebulizer solution Take 3 mLs (2.5 mg total) by nebulization every 6 (six) hours as needed for wheezing or shortness of breath. 11/18/17   Loletha Grayer, MD  amLODipine (NORVASC) 5 MG tablet Take 5 mg by mouth daily. 07/15/20   [provider]  Armodafinil (NUVIGIL) 50 MG tablet Take 1 tablet by mouth daily. 07/16/21   [provider]  aspirin EC 81  MG tablet Take 81 mg by mouth daily.    [provider]  atorvastatin (LIPITOR) 40 MG tablet TAKE ONE TABLET BY MOUTH EVERY DAY Patient taking differently: Take 40 mg by mouth at bedtime. 04/21/16   Tawni Millers, MD  BD PEN NEEDLE MICRO U/F 32G X 6 MM MISC SMARTSIG:Pre-Filled Pen Syringe Injection 3 Times Daily 09/22/19   [provider]  Blood Glucose Monitoring Suppl (ACCU-CHEK GUIDE) w/Device KIT Use as instructed to check blood sugar 3 times a day. E11.59 09/05/19   Philemon Kingdom, MD  budesonide-formoterol [facility]) 160-4.5 MCG/ACT inhaler Inhale 2 puffs into the lungs 2 (two) times daily. 02/25/21   [provider]  carvedilol (COREG) 25 MG tablet Take 1 tablet by mouth twice daily Patient taking differently: Take 25 mg by mouth 2 (two) times daily with a meal. 04/15/21   Alisa Graff, FNP  cetirizine (ZYRTEC) 10 MG tablet Take 10 mg by mouth daily. 08/19/21   [provider]  esomeprazole (NEXIUM) 40 MG capsule Take 40 mg by mouth daily. am    [provider]  fluticasone (FLONASE) 50 MCG/ACT nasal spray Place 1 spray into both nostrils daily.    [provider]  insulin glargine (LANTUS SOLOSTAR) 100 UNIT/ML Solostar Pen Inject 20 Units into the skin at bedtime. INJECT 30 UNITS SUBCUTANEOUSLY IN THE MORNING AND 30 UNITS AT NIGHT 06/18/21   Lorella Nimrod, MD  ipratropium (ATROVENT) 0.06 % nasal spray Place 2 sprays into both nostrils 4 (four) times daily.    [provider]  methimazole (TAPAZOLE) 5 MG tablet Take 1 tablet (5 mg total) by mouth daily. 12/05/20   Philemon Kingdom, MD  NOVOLOG FLEXPEN 100 UNIT/ML FlexPen Inject 6 Units into the skin 3 (three) times daily with meals. Inject 18-24 units 2x a day before meals 06/18/21   Lorella Nimrod, MD  OZEMPIC, 1 MG/DOSE, 4 MG/3ML SOPN INJECT 1MG INTO THE SKIN ONCE A WEEK 10/13/21   Philemon Kingdom, MD  potassium chloride (KLOR-CON) 10 MEQ tablet Take 20 mEq by mouth daily. 05/06/21    [provider]  tiotropium (SPIRIVA) 18 MCG inhalation capsule Place 18 mcg into inhaler and inhale daily.    [provider]  Vitamin D, Ergocalciferol, (DRISDOL) 1.25 MG (50000 UNIT) CAPS capsule Take 50,000 Units by mouth every 7 (seven) days.    [provider]    Physical Exam: Vitals:   10/17/21 0952 10/17/21 1100 10/17/21 1130 10/17/21 1200  BP: (!) 129/98 (!) 141/70 (!) 153/88 (!) 163/64  Pulse: 87 85 86 90  Resp: '18 18 17 18  ' Temp: 98 F (36.7 C)     TempSrc: Oral     SpO2: 98% 100% 98% 99%  Weight:      Height:       Physical Exam Vitals and nursing note reviewed.  Constitutional:      Comments: Chronically ill-appearing  HENT:     Head: Normocephalic.  Nose: Nose normal.     Mouth/Throat:     Mouth: Mucous membranes are moist.  Eyes:     Comments: Pale conjunctiva  Cardiovascular:     Rate and Rhythm: Normal rate and regular rhythm.  Pulmonary:     Breath sounds: Wheezing and rhonchi present.     Comments: Scattered expiratory wheezes.  Rhonchi right base Abdominal:     General: Bowel sounds are normal.     Palpations: Abdomen is soft.  Musculoskeletal:        General: Normal range of motion.     Cervical back: Normal range of motion and neck supple.  Skin:    General: Skin is warm and dry.  Neurological:     Motor: Weakness present.  Psychiatric:        Mood and Affect: Mood normal.        Behavior: Behavior normal.     Data Reviewed: Relevant notes from primary care and specialist visits, past discharge summaries as available in EHR, including Care Everywhere. Prior diagnostic testing as pertinent to current admission diagnoses Updated medications and problem lists for reconciliation ED course, including vitals, labs, imaging, treatment and response to treatment Triage notes, nursing and pharmacy notes and ED provider's notes Notable results as noted in HPI Labs reviewed.  Sodium 144, potassium 4.6, chloride 116,  bicarb 23, glucose 110, BUN 40, creatinine 4.37, calcium 8.8, total protein 6.9, albumin 2.9, AST 11, ALT 9, alk phos 22, total bilirubin 0.5, white count 8.0, hemoglobin 10.1, hematocrit 34.8, platelet count 237 Respiratory viral panel is negative Chest x-ray reviewed by me shows Right lower lobe pneumonia with trace right pleural effusion. Twelve-lead EKG shows normal sinus rhythm with a left posterior fascicular block There are no new results to review at this time.  Assessment and Plan: * CAP (community acquired pneumonia) Chest x-ray shows right lower lobe infiltrate Place patient on Rocephin and Zithromax Follow-up results of blood cultures  COPD (chronic obstructive pulmonary disease) ([place]) Patient has a history of COPD with chronic respiratory failure Continue oxygen supplementation at 4 L to maintain pulse oximetry greater than 92% Continue as needed bronchodilator therapy as well as inhaled steroids  Essential hypertension Blood pressure is stable Continue carvedilol and amlodipine  Hypoglycemia Most likely iatrogenic and following insulin administration Patient has a history of insulin-dependent diabetes mellitus with recent medication adjustments made by her endocrinologist. She had an episode of hypoglycemia that improved following administration of oral glucose and D10. Hold long and short acting insulin for now Check blood sugars every 4 hours Patient may likely require lower doses of insulin due to her renal insufficiency  Hyperthyroidism Stable Continue methimazole  Morbid obesity (HCC) BMI 65.78 Complicates overall prognosis and care Lifestyle modification and exercise has been discussed with patient in detail  CKD stage 5 due to type 2 diabetes mellitus ([place]) Patient has stage V chronic kidney disease secondary to diabetes mellitus. She states that her nephrologist has discussed renal replacement therapy with her and she is agreeable We will consult  nephrology  Chronic respiratory failure with hypoxia (HCC) Continue oxygen supplementation at 4 L to maintain pulse oximetry greater than 92%      Advance Care Planning:   Code Status: Full Code   Consults: Nephrology  Family Communication: Greater than 50% of time was spent discussing patient's condition and plan of care with her at the bedside. All questions and concerns have been discussed with her in detail.  She verbalizes understanding and agrees with  the plan.  Severity of Illness: The appropriate patient status for this patient is OBSERVATION. Observation status is judged to be reasonable and necessary in order to provide the required intensity of service to ensure the patient's safety. The patient's presenting symptoms, physical exam findings, and initial radiographic and laboratory data in the context of their medical condition is felt to place them at decreased risk for further clinical deterioration. Furthermore, it is anticipated that the patient will be medically stable for discharge from the hospital within 2 midnights of admission.   Author: Collier Bullock, MD 10/17/2021 12:33 PM  For on call review [email or web address].

## 2021-10-17 NOTE — Assessment & Plan Note (Signed)
Blood pressure is stable Continue carvedilol and amlodipine

## 2021-10-17 NOTE — Assessment & Plan Note (Signed)
Stable Continue methimazole

## 2021-10-18 DIAGNOSIS — J449 Chronic obstructive pulmonary disease, unspecified: Secondary | ICD-10-CM

## 2021-10-18 DIAGNOSIS — I10 Essential (primary) hypertension: Secondary | ICD-10-CM

## 2021-10-18 DIAGNOSIS — E162 Hypoglycemia, unspecified: Secondary | ICD-10-CM

## 2021-10-18 DIAGNOSIS — D638 Anemia in other chronic diseases classified elsewhere: Secondary | ICD-10-CM

## 2021-10-18 DIAGNOSIS — J181 Lobar pneumonia, unspecified organism: Secondary | ICD-10-CM

## 2021-10-18 DIAGNOSIS — E669 Obesity, unspecified: Secondary | ICD-10-CM

## 2021-10-18 DIAGNOSIS — E0590 Thyrotoxicosis, unspecified without thyrotoxic crisis or storm: Secondary | ICD-10-CM

## 2021-10-18 LAB — BASIC METABOLIC PANEL
Anion gap: 4 — ABNORMAL LOW (ref 5–15)
BUN: 41 mg/dL — ABNORMAL HIGH (ref 8–23)
CO2: 21 mmol/L — ABNORMAL LOW (ref 22–32)
Calcium: 8.3 mg/dL — ABNORMAL LOW (ref 8.9–10.3)
Chloride: 118 mmol/L — ABNORMAL HIGH (ref 98–111)
Creatinine, Ser: 4.46 mg/dL — ABNORMAL HIGH (ref 0.44–1.00)
GFR, Estimated: 11 mL/min — ABNORMAL LOW (ref >60)
Glucose, Bld: 103 mg/dL — ABNORMAL HIGH (ref 70–99)
Potassium: 4.7 mmol/L (ref 3.5–5.1)
Sodium: 143 mmol/L (ref 135–145)

## 2021-10-18 LAB — GLUCOSE, CAPILLARY
Glucose-Capillary: 102 mg/dL — ABNORMAL HIGH (ref 70–99)
Glucose-Capillary: 99 mg/dL (ref 70–99)
Glucose-Capillary: 136 mg/dL — ABNORMAL HIGH (ref 70–99)
Glucose-Capillary: 115 mg/dL — ABNORMAL HIGH (ref 70–99)
Glucose-Capillary: 182 mg/dL — ABNORMAL HIGH (ref 70–99)

## 2021-10-18 LAB — CBC
HCT: 30.3 % — ABNORMAL LOW (ref 36.0–46.0)
Hemoglobin: 8.8 g/dL — ABNORMAL LOW (ref 12.0–15.0)
MCH: 27.2 pg (ref 26.0–34.0)
MCHC: 29 g/dL — ABNORMAL LOW (ref 30.0–36.0)
MCV: 93.8 fL (ref 80.0–100.0)
Platelets: 213 10*3/uL (ref 150–400)
RBC: 3.23 MIL/uL — ABNORMAL LOW (ref 3.87–5.11)
RDW: 13.5 % (ref 11.5–15.5)
WBC: 6.6 10*3/uL (ref 4.0–10.5)
nRBC: 0 % (ref 0.0–0.2)

## 2021-10-18 LAB — BLOOD CULTURE ID PANEL (REFLEXED) - BCID2

## 2021-10-18 MED ORDER — VANCOMYCIN HCL 1500 MG/300ML IV SOLN
1500.0000 mg | Freq: Once | INTRAVENOUS | Status: AC
  Administered 2021-10-18: 1500 mg via INTRAVENOUS
  Filled 2021-10-18: qty 300

## 2021-10-18 MED ORDER — VANCOMYCIN VARIABLE DOSE PER UNSTABLE RENAL FUNCTION (PHARMACIST DOSING): Status: DC

## 2021-10-18 MED ORDER — PNEUMOCOCCAL 20-VAL CONJ VACC 0.5 ML IM SUSY
0.5000 mL | PREFILLED_SYRINGE | INTRAMUSCULAR | Status: DC
  Filled 2021-10-18: qty 0.5

## 2021-10-18 MED ORDER — CALCITRIOL 0.25 MCG PO CAPS: 0.2500 ug | ORAL_CAPSULE | ORAL | Status: DC

## 2021-10-18 MED ORDER — SODIUM CHLORIDE 0.9 % IV SOLN
2.0000 g | INTRAVENOUS | Status: DC
  Administered 2021-10-18 – 2021-10-19 (×2): 2 g via INTRAVENOUS
  Filled 2021-10-18 (×2): qty 2

## 2021-10-18 MED ORDER — VANCOMYCIN HCL IN DEXTROSE 1-5 GM/200ML-% IV SOLN
1000.0000 mg | Freq: Once | INTRAVENOUS | Status: AC
  Administered 2021-10-18: 1000 mg via INTRAVENOUS
  Filled 2021-10-18: qty 200

## 2021-10-18 NOTE — Progress Notes (Signed)
PHARMACY - PHYSICIAN COMMUNICATION CRITICAL VALUE ALERT - BLOOD CULTURE IDENTIFICATION (BCID)  Emily [name] is an 61 y.o. female who presented to [facility] on 10/17/2021 with a chief complaint of CAP, ESRD.    Assessment:  Staph species in 2 of 4 bottles.   (include suspected source if known)  Name of physician (or Provider) Contacted: Sharion Settler, NP   Current antibiotics: Ceftriaxone, Azithromycin   Changes to prescribed antibiotics recommended:  Will d/c ceftriaxone and azithromycin and start Vancomycin.    Results for orders placed or performed during the hospital encounter of 10/17/21  Blood Culture ID Panel (Reflexed) (Collected: 10/17/2021 10:23 AM)  Result Value Ref Range   Enterococcus faecalis NOT DETECTED NOT DETECTED   Enterococcus Faecium NOT DETECTED NOT DETECTED   Listeria monocytogenes NOT DETECTED NOT DETECTED   Staphylococcus species DETECTED (A) NOT DETECTED   Staphylococcus aureus (BCID) NOT DETECTED NOT DETECTED   Staphylococcus epidermidis NOT DETECTED NOT DETECTED   Staphylococcus lugdunensis NOT DETECTED NOT DETECTED   Streptococcus species DETECTED (A) NOT DETECTED   Streptococcus agalactiae NOT DETECTED NOT DETECTED   Streptococcus pneumoniae NOT DETECTED NOT DETECTED   Streptococcus pyogenes NOT DETECTED NOT DETECTED   A.calcoaceticus-baumannii NOT DETECTED NOT DETECTED   Bacteroides fragilis NOT DETECTED NOT DETECTED   Enterobacterales NOT DETECTED NOT DETECTED   Enterobacter cloacae complex NOT DETECTED NOT DETECTED   Escherichia coli NOT DETECTED NOT DETECTED   Klebsiella aerogenes NOT DETECTED NOT DETECTED   Klebsiella oxytoca NOT DETECTED NOT DETECTED   Klebsiella pneumoniae NOT DETECTED NOT DETECTED   Proteus species NOT DETECTED NOT DETECTED   Salmonella species NOT DETECTED NOT DETECTED   Serratia marcescens NOT DETECTED NOT DETECTED   Haemophilus influenzae NOT DETECTED NOT DETECTED   Neisseria meningitidis NOT DETECTED NOT  DETECTED   Pseudomonas aeruginosa NOT DETECTED NOT DETECTED   Stenotrophomonas maltophilia NOT DETECTED NOT DETECTED   Candida albicans NOT DETECTED NOT DETECTED   Candida auris NOT DETECTED NOT DETECTED   Candida glabrata NOT DETECTED NOT DETECTED   Candida krusei NOT DETECTED NOT DETECTED   Candida parapsilosis NOT DETECTED NOT DETECTED   Candida tropicalis NOT DETECTED NOT DETECTED   Cryptococcus neoformans/gattii NOT DETECTED NOT DETECTED    Emily [name] D 10/18/2021  5:04 AM

## 2021-10-18 NOTE — Progress Notes (Addendum)
Pharmacy Antibiotic Note  Emily [name] is a 61 y.o. female admitted on 10/17/2021 with pneumonia and bacteremia.  Pharmacy has been consulted for Vancomycin dosing.   Pt has Stage V CKD ,  Pt has agreed to start renal replacement therapy.   No HD of PD set at this time.   Plan: Vancomycin 2500 mg IV X 1 ordered for 10/14 @ 0500.    - Pt is set to begin some form of renal replacement therapy; no additional Vanc doses ordered.  Will need to f/u with plans on 10/14 with day shift and dose accordingly.   Height: '5\' 4"'$  (162.6 cm) Weight: 103.2 kg (227 lb 8.2 oz) IBW/kg (Calculated) : 54.7  Temp (24hrs), Avg:98.1 F (36.7 C), Min:97.7 F (36.5 C), Max:98.8 F (37.1 C)  Recent Labs  Lab 10/17/21 0544 10/17/21 1023  WBC 8.0  --   CREATININE 4.37*  --   LATICACIDVEN  --  0.7    Estimated Creatinine Clearance: 15.6 mL/min (A) (by C-G formula based on SCr of 4.37 mg/dL (H)).    No Known Allergies  Antimicrobials this admission:   >>    >>   Dose adjustments this admission:   Microbiology results:  BCx:   UCx:    Sputum:    MRSA PCR:   Thank you for allowing pharmacy to be a part of this patient's care.  Hunt Zajicek D 10/18/2021 5:05 AM

## 2021-10-18 NOTE — Plan of Care (Signed)
Pt talking without SOB . Continues on 5 L [place].  Verbalized understanding general information of room /bed/and call bell usgae

## 2021-10-18 NOTE — Assessment & Plan Note (Signed)
BMI 39.05

## 2021-10-18 NOTE — Progress Notes (Signed)
  Progress Note   Patient: Emily [name] FRT:[id number] DOB: 16-May-1960 DOA: 10/17/2021     1 DOS: the patient was seen and examined on 10/18/2021     Assessment and Plan: * CAP (community acquired pneumonia) Right lobar pneumonia. On vancomycin and Rocephin secondary to blood cultures growing staph species and strep species.  I think this is a contamination but need to follow-up sensitivities prior to determining this.   COPD (chronic obstructive pulmonary disease) ([place]) Patient has a history of COPD with chronic respiratory failure Continue oxygen supplementation Continue as needed bronchodilator therapy as well as inhaled steroids  Essential hypertension Blood pressure is stable Continue Coreg  Hypoglycemia Last hemoglobin A1c last month 6.7.  Probably can cut way back on insulin.  Continue checking fingersticks before every meal and nightly.  Hyperthyroidism Stable Continue methimazole  Anemia of chronic disease Hemoglobin 8.8  Obesity (BMI 30-39.9) BMI 39.05  CKD stage 5 due to type 2 diabetes mellitus [facility]) Nephrology consultation.  Creatinine 4.46 with a GFR of 11.  Chronic respiratory failure with hypoxia (HCC) Continue oxygen supplementation        Subjective: Patient feeling okay.  Had a low sugar upon coming in.  Increase in her oxygen from baseline at home up to 6 L.  Some worsening shortness of breath upon coming in.  Found to have pneumonia.  So far blood cultures growing staph and strep species.  Physical Exam: Vitals:   10/17/21 2211 10/17/21 2351 10/18/21 0441 10/18/21 0817  BP:  (!) 143/77 (!) 156/87 117/66  Pulse:  89 91 91  Resp:  '18 17 17  '$ Temp:  98.1 F (36.7 C) 98.8 F (37.1 C) 98 F (36.7 C)  TempSrc:      SpO2:  98% 100% 95%  Weight: 103.2 kg     Height: '5\' 4"'$  (1.626 m)      Physical Exam HENT:     Head: Normocephalic.     Mouth/Throat:     Pharynx: No oropharyngeal exudate.  Eyes:     General: Lids are normal.      Conjunctiva/sclera: Conjunctivae normal.  Cardiovascular:     Rate and Rhythm: Normal rate and regular rhythm.     Heart sounds: Normal heart sounds, S1 normal and S2 normal.  Pulmonary:     Breath sounds: Examination of the right-lower field reveals decreased breath sounds and rhonchi. Examination of the left-lower field reveals decreased breath sounds. Decreased breath sounds and rhonchi present. No wheezing or rales.  Abdominal:     Palpations: Abdomen is soft.     Tenderness: There is no abdominal tenderness.  Musculoskeletal:     Right lower leg: No swelling.     Left lower leg: No swelling.  Skin:    General: Skin is warm.     Findings: No rash.  Neurological:     Mental Status: She is alert and oriented to person, place, and time.     Data Reviewed: Creatinine 4.46.,  White blood cell count 6.6, hemoglobin 8.8, platelet count 213  Family Communication: Updated patient's mother on the phone  Disposition: Status is: Inpatient Remains inpatient appropriate because: Blood cultures positive for strep and staph species.  Likely contamination but need to see identification before confirming this.  Continue IV antibiotics for pneumonia.  Planned Discharge Destination: Home    Time spent: 28 minutes  Author: Loletha Grayer, MD 10/18/2021 2:56 PM  For on call review [email or web address].

## 2021-10-18 NOTE — Assessment & Plan Note (Addendum)
Last hemoglobin 8.7. 

## 2021-10-18 NOTE — Plan of Care (Signed)
  Problem: Activity: Goal: Ability to tolerate increased activity will improve Outcome: Progressing  Pt oob to bathroom independently with O2 at 4L [place] (oxygen extension tubing in place) Problem: Clinical Measurements: Goal: Ability to maintain a body temperature in the normal range will improve Outcome: Progressing   Problem: Respiratory: Goal: Ability to maintain adequate ventilation will improve Outcome: Progressing Pt's oxygen decreased from 5 to 4L [place] this shift; pt verbalized she is on 4L at home Goal: Ability to maintain a clear airway will improve Outcome: Progressing   Problem: Education: Goal: Knowledge of General Education information will improve Description: Including pain rating scale, medication(s)/side effects and non-pharmacologic comfort measures Outcome: Progressing   Problem: Health Behavior/Discharge Planning: Goal: Ability to manage health-related needs will improve Outcome: Progressing   Problem: Clinical Measurements: Goal: Ability to maintain clinical measurements within normal limits will improve Outcome: Progressing Goal: Will remain free from infection Outcome: Progressing Goal: Diagnostic test results will improve Outcome: Progressing Goal: Respiratory complications will improve Outcome: Progressing Goal: Cardiovascular complication will be avoided Outcome: Progressing   Problem: Activity: Goal: Risk for activity intolerance will decrease Outcome: Progressing   Problem: Nutrition: Goal: Adequate nutrition will be maintained Outcome: Progressing   Problem: Coping: Goal: Level of anxiety will decrease Outcome: Progressing   Problem: Elimination: Goal: Will not experience complications related to bowel motility Outcome: Progressing Goal: Will not experience complications related to urinary retention Outcome: Progressing   Problem: Pain Managment: Goal: General experience of comfort will improve Outcome: Progressing   Problem:  Safety: Goal: Ability to remain free from injury will improve Outcome: Progressing   Problem: Skin Integrity: Goal: Risk for impaired skin integrity will decrease Outcome: Progressing

## 2021-10-18 NOTE — Progress Notes (Addendum)
[facility]  PROGRESS NOTE   Subjective:   Patient is a 61 year old African-American female with a past medical history of diabetes mellitus, peripheral vascular disease, obesity, obstructive sleep apnea-on CPAP, COPD, hypertension, CHF, CKD with nephrotic range proteinuria, anemia who was brought to the ER via EMS for chief complaint of hypoglycemia  History of present illness date backs to October 13 when EMS was called and evaluation EMS found patient blood sugar to be 52 and she was brought to the ER. Upon evaluation in the ER patient was found to have right lower lobe infiltrate and patient was admitted for further care.  Nephrology was consulted because of elevated creatinine  Patient was seen today on first floor. Patient was resting comfortably in the bed Patient offers no new specific physical complaints   Objective:  Vital signs: Blood pressure 117/66, pulse 91, temperature 98 F (36.7 C), resp. rate 17, height '5\' 4"'$  (1.626 m), weight 103.2 kg, SpO2 95 %.  Intake/Output Summary (Last 24 hours) at 10/18/2021 1530 Last data filed at 10/18/2021 1300 Gross per 24 hour  Intake 546.81 ml  Output --  Net 546.81 ml   Filed Weights   10/17/21 0537 10/17/21 2211  Weight: 104.3 kg 103.2 kg     Physical Exam: General:  No acute distress  Head:  Normocephalic, atraumatic. Moist oral mucosal membranes  Eyes:  Anicteric  Lungs:   Clear to auscultation, normal effort  Heart:  S1S2 no rubs  Abdomen:   Soft, nontender, bowel sounds present  Extremities: trace peripheral edema.  Neurologic:  Awake, alert, following commands  Skin:  No lesions, dry BLE  Access: none    Basic Metabolic Panel: Recent Labs  Lab 10/17/21 0544 10/18/21 0433  NA 144 143  K 4.6 4.7  CL 116* 118*  CO2 23 21*  GLUCOSE 110* 103*  BUN 40* 41*  CREATININE 4.37* 4.46*  CALCIUM 8.8* 8.3*    CBC: Recent Labs  Lab 10/17/21 0544 10/18/21 0433  WBC 8.0 6.6  NEUTROABS 5.8  --    HGB 10.1* 8.8*  HCT 34.8* 30.3*  MCV 94.6 93.8  PLT 237 213     Urinalysis: Recent Labs    10/17/21 0912  COLORURINE YELLOW*  LABSPEC 1.013  PHURINE 5.0  GLUCOSEU 150*  HGBUR SMALL*  BILIRUBINUR NEGATIVE  KETONESUR NEGATIVE  PROTEINUR >=300*  NITRITE NEGATIVE  LEUKOCYTESUR NEGATIVE      Imaging: DG Chest Portable 1 View  Result Date: 10/17/2021 CLINICAL DATA:  Weakness. EXAM: PORTABLE CHEST 1 VIEW COMPARISON:  CT chest and chest x-ray dated June 14, 2021. FINDINGS: Unchanged mild cardiomegaly. New patchy consolidation in the right lower lobe with trace right pleural effusion. No pneumothorax. No acute osseous abnormality. IMPRESSION: 1. Right lower lobe pneumonia with trace right pleural effusion. Electronically Signed   By: Titus Dubin M.D.   On: 10/17/2021 09:48     Medications:    sodium chloride     cefTRIAXone (ROCEPHIN)  IV Stopped (10/18/21 1045)    aspirin EC  81 mg Oral Daily   atorvastatin  40 mg Oral QHS   carvedilol  25 mg Oral BID WC   heparin  5,000 Units Subcutaneous Q8H   loratadine  10 mg Oral Daily   modafinil  100 mg Oral Daily   mometasone-formoterol  2 puff Inhalation BID   pantoprazole  40 mg Oral Daily   pneumococcal 20-valent conjugate vaccine  0.5 mL Intramuscular Tomorrow-1000   sodium chloride flush  3 mL  Intravenous Q12H   tiotropium  18 mcg Inhalation Daily   vancomycin variable dose per unstable renal function (pharmacist dosing)   Does not apply See admin instructions   [START ON 10/22/2021] Vitamin D (Ergocalciferol)  50,000 Units Oral Q7 days    Assessment/ Plan:     Principal Problem:   CAP (community acquired pneumonia) Active Problems:   Essential hypertension   Hyperthyroidism   COPD (chronic obstructive pulmonary disease) (HCC)   Hypoglycemia   Chronic respiratory failure with hypoxia (HCC)   CKD stage 5 due to type 2 diabetes mellitus (HCC)   Obesity (BMI 30-39.9)   Anemia of chronic disease   Lobar pneumonia  (HCC)  Patient is a 61 y.o. 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     #1:CKD/chronic kidney disease stage V    Patient's creatinine is stable at her baseline.  Patient was recently admitted and June at that time patient had AKI with a peak creatinine of 4.8, by the time of discharge patient creatinine stabilized at 4.4 now at October patient creatinine is stable at around 4.4-4.5 no acute need for renal placement therapy   #2:Community-acquired pneumonia Patient is on antibiotics Patient is being followed by the primary team   #3: Hypoglycemia: resolved with treatment. Patient is being followed by the primary team   #4: Obstructive sleep apnea/COPD: nightly Cpap   #5: Anemia with chronic kidney disease: Patient hemoglobin is trending down.  Patient may need Epogen as an outpatient   #6: Secondary hyperparathyroidism: Patient intact PTH was elevated at 199       We will start patient on calcitriol       We will did we will discontinue patient's outpatient ergocalciferol as patient  D level has improved from the last 24 to now 28.1   #7: Hypertension: Blood pressure is at goal .   #8.  Vascular access.Educated patient at length about need for access placement as patient has CKD stage V/GFR is 11 mL/min and patient does not have any permanent access   LOS: 1 Danile Trier s [facility] 10/14/20233:30 PM

## 2021-10-19 LAB — FERRITIN: Ferritin: 90 ng/mL (ref 11–307)

## 2021-10-19 LAB — BASIC METABOLIC PANEL
Anion gap: 7 (ref 5–15)
BUN: 46 mg/dL — ABNORMAL HIGH (ref 8–23)
CO2: 20 mmol/L — ABNORMAL LOW (ref 22–32)
Calcium: 8.3 mg/dL — ABNORMAL LOW (ref 8.9–10.3)
Chloride: 115 mmol/L — ABNORMAL HIGH (ref 98–111)
Creatinine, Ser: 4.86 mg/dL — ABNORMAL HIGH (ref 0.44–1.00)
GFR, Estimated: 10 mL/min — ABNORMAL LOW (ref >60)
Glucose, Bld: 130 mg/dL — ABNORMAL HIGH (ref 70–99)
Potassium: 4.4 mmol/L (ref 3.5–5.1)
Sodium: 142 mmol/L (ref 135–145)

## 2021-10-19 LAB — HEMOGLOBIN: Hemoglobin: 8.7 g/dL — ABNORMAL LOW (ref 12.0–15.0)

## 2021-10-19 LAB — GLUCOSE, CAPILLARY
Glucose-Capillary: 104 mg/dL — ABNORMAL HIGH (ref 70–99)
Glucose-Capillary: 125 mg/dL — ABNORMAL HIGH (ref 70–99)

## 2021-10-19 LAB — MRSA NEXT GEN BY PCR, NASAL: MRSA by PCR Next Gen: NOT DETECTED

## 2021-10-19 LAB — VANCOMYCIN, RANDOM: Vancomycin Rm: 28 ug/mL

## 2021-10-19 MED ORDER — CEFDINIR 300 MG PO CAPS: 300.0000 mg | ORAL_CAPSULE | Freq: Every day | ORAL | 0 refills | Status: AC

## 2021-10-19 MED ORDER — SODIUM BICARBONATE 650 MG PO TABS: 650.0000 mg | ORAL_TABLET | Freq: Two times a day (BID) | ORAL | 1 refills | Status: AC

## 2021-10-19 MED ORDER — NOVOLOG FLEXPEN 100 UNIT/ML ~~LOC~~ SOPN: PEN_INJECTOR | SUBCUTANEOUS | 3 refills | Status: AC

## 2021-10-19 MED ORDER — DOXYCYCLINE HYCLATE 100 MG PO CAPS: 100.0000 mg | ORAL_CAPSULE | Freq: Two times a day (BID) | ORAL | 0 refills | Status: AC

## 2021-10-19 MED ORDER — CALCITRIOL 0.25 MCG PO CAPS: 0.2500 ug | ORAL_CAPSULE | ORAL | 0 refills | Status: AC

## 2021-10-19 MED ORDER — LANTUS SOLOSTAR 100 UNIT/ML ~~LOC~~ SOPN: 5.0000 [IU] | PEN_INJECTOR | Freq: Every day | SUBCUTANEOUS | 3 refills | Status: AC

## 2021-10-19 NOTE — Plan of Care (Signed)
°  Problem: Activity: °Goal: Ability to tolerate increased activity will improve °Outcome: Progressing °  °Problem: Respiratory: °Goal: Ability to maintain adequate ventilation will improve °Outcome: Progressing °Goal: Ability to maintain a clear airway will improve °Outcome: Progressing °  °

## 2021-10-19 NOTE — Plan of Care (Signed)

## 2021-10-19 NOTE — Progress Notes (Signed)
Pt's mother called the pt's room telephone to advise her she was at the [place] entrance; pt discharged via wheelchair by nursing to the [place] entrance for discharge

## 2021-10-19 NOTE — Progress Notes (Signed)
MD order received in [facility] to discharge pt home today; verbally reviewed AVS with pt including medications (the ones to stop, the ones to change and the new ones), pt to pick up Rxs at the [place] on [address] in [place], [place]; pt verbalized understanding with no questions; pt's discharge pending arrival of her mother to the [place] entrance where she will call her daughter to advise that she is out there and nursing staff will bring her out in a wheelchair for discharge

## 2021-10-19 NOTE — Progress Notes (Signed)
[facility]  PROGRESS NOTE   Subjective:   Patient is a 61 year old African-American female with a past medical history of diabetes mellitus, peripheral vascular disease, obesity, obstructive sleep apnea-on CPAP, COPD, hypertension, CHF, CKD with nephrotic range proteinuria, anemia who was brought to the ER via EMS for chief complaint of hypoglycemia  History of present illness date backs to October 13 when EMS was called and evaluation EMS found patient blood sugar to be 52 and she was brought to the ER. Upon evaluation in the ER patient was found to have right lower lobe infiltrate and patient was admitted for further care.  Nephrology was consulted because of elevated creatinine  Patient was seen today on first floor. Patient was resting comfortably in the bed Patient offers no new specific physical complaints   Objective:  Vital signs: Blood pressure (!) 155/75, pulse 98, temperature 98 F (36.7 C), temperature source Oral, resp. rate 18, height '5\' 4"'$  (1.626 m), weight 103.2 kg, SpO2 91 %.  Intake/Output Summary (Last 24 hours) at 10/19/2021 0836 Last data filed at 10/18/2021 1300 Gross per 24 hour  Intake 340 ml  Output --  Net 340 ml   Filed Weights   10/17/21 0537 10/17/21 2211  Weight: 104.3 kg 103.2 kg     Physical Exam: General:  No acute distress  Head:  Normocephalic, atraumatic. Moist oral mucosal membranes  Eyes:  Anicteric  Lungs:   Clear to auscultation, normal effort  Heart:  S1S2 no rubs  Abdomen:   Soft, nontender, bowel sounds present  Extremities: trace peripheral edema.  Neurologic:  Awake, alert, following commands  Skin:  No lesions, dry BLE  Access: none    Basic Metabolic Panel: Recent Labs  Lab 10/17/21 0544 10/18/21 0433 10/19/21 0617  NA 144 143 142  K 4.6 4.7 4.4  CL 116* 118* 115*  CO2 23 21* 20*  GLUCOSE 110* 103* 130*  BUN 40* 41* 46*  CREATININE 4.37* 4.46* 4.86*  CALCIUM 8.8* 8.3* 8.3*    CBC: Recent Labs   Lab 10/17/21 0544 10/18/21 0433 10/19/21 0617  WBC 8.0 6.6  --   NEUTROABS 5.8  --   --   HGB 10.1* 8.8* 8.7*  HCT 34.8* 30.3*  --   MCV 94.6 93.8  --   PLT 237 213  --      Urinalysis: Recent Labs    10/17/21 0912  COLORURINE YELLOW*  LABSPEC 1.013  PHURINE 5.0  GLUCOSEU 150*  HGBUR SMALL*  BILIRUBINUR NEGATIVE  KETONESUR NEGATIVE  PROTEINUR >=300*  NITRITE NEGATIVE  LEUKOCYTESUR NEGATIVE      Imaging: DG Chest Portable 1 View  Result Date: 10/17/2021 CLINICAL DATA:  Weakness. EXAM: PORTABLE CHEST 1 VIEW COMPARISON:  CT chest and chest x-ray dated June 14, 2021. FINDINGS: Unchanged mild cardiomegaly. New patchy consolidation in the right lower lobe with trace right pleural effusion. No pneumothorax. No acute osseous abnormality. IMPRESSION: 1. Right lower lobe pneumonia with trace right pleural effusion. Electronically Signed   By: Titus Dubin M.D.   On: 10/17/2021 09:48     Medications:    sodium chloride     cefTRIAXone (ROCEPHIN)  IV Stopped (10/18/21 1045)    aspirin EC  81 mg Oral Daily   atorvastatin  40 mg Oral QHS   [START ON 10/20/2021] calcitRIOL  0.25 mcg Oral Once per day on Mon Wed Fri   carvedilol  25 mg Oral BID WC   heparin  5,000 Units Subcutaneous Q8H  loratadine  10 mg Oral Daily   modafinil  100 mg Oral Daily   mometasone-formoterol  2 puff Inhalation BID   pantoprazole  40 mg Oral Daily   pneumococcal 20-valent conjugate vaccine  0.5 mL Intramuscular Tomorrow-1000   sodium chloride flush  3 mL Intravenous Q12H   tiotropium  18 mcg Inhalation Daily   vancomycin variable dose per unstable renal function (pharmacist dosing)   Does not apply See admin instructions    Assessment/ Plan:     Principal Problem:   CAP (community acquired pneumonia) Active Problems:   Essential hypertension   Hyperthyroidism   COPD (chronic obstructive pulmonary disease) (HCC)   Hypoglycemia   Chronic respiratory failure with hypoxia ([place])   CKD  stage 5 due to type 2 diabetes mellitus (HCC)   Obesity (BMI 30-39.9)   Anemia of chronic disease   Lobar pneumonia ([place])  Patient is a 61 y.o. 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     #1:CKD/chronic kidney disease stage V    Patient's creatinine is stable at her baseline.  Patient was recently admitted and June at that time patient had AKI with a peak creatinine of 4.8, by the time of discharge patient creatinine stabilized at 4.4 now at October patient creatinine is stable at around 4.4-4.8 no acute need for renal placement therapy   #2:Community-acquired pneumonia Patient is on antibiotics Patient is being followed by the primary team   #3: Hypoglycemia: resolved with treatment. Patient is being followed by the primary team   #4: Obstructive sleep apnea/COPD: nightly Cpap   #5: Anemia with chronic kidney disease: Patient hemoglobin is trending down.  Patient may need Epogen as an outpatient   #6: Secondary hyperparathyroidism: Patient intact PTH was elevated at 199        Patient on calcitriol  #7: Hypertension: Blood pressure is at goal .  #8.  Vascular access- I again educated patient at length about need for access placement as patient has CKD stage V/GFR is 11 mL/min and patient does not have any permanent access   LOS: 2 Yasemin Rabon s [facility] 10/15/20238:36 AM

## 2021-10-19 NOTE — Progress Notes (Signed)
Mobility Specialist - Progress Note   10/19/21 0939  Mobility  Activity Ambulated independently to bathroom;Stood at bedside;Dangled on edge of bed  Level of Assistance Independent after set-up  Assistive Device None  Distance Ambulated (ft) 10 ft  Activity Response Tolerated well  $Mobility charge 1 Mobility   Pt supine in bed on 4L upon arrival. Pt STS and ambulates to restroom indep after set up. Pt left in bed with needs in reach.   Gretchen Short  Mobility Specialist  10/19/21 9:40 AM

## 2021-10-19 NOTE — Discharge Summary (Signed)
Physician Discharge Summary   Patient: Emily [name] MRN: [id number] DOB: 1960-09-22  Admit date:     10/17/2021  Discharge date: 10/19/21  Discharge Physician: Loletha Grayer   PCP: [facility]   Recommendations at discharge:   Follow-up PCP 5 days Follow-up with your nephrologist  Discharge Diagnoses: Principal Problem:   CAP (community acquired pneumonia) Active Problems:   COPD (chronic obstructive pulmonary disease) ([place])   Essential hypertension   Hypoglycemia   Hyperthyroidism   Chronic respiratory failure with hypoxia ([place])   CKD stage 5 due to type 2 diabetes mellitus (HCC)   Obesity (BMI 30-39.9)   Anemia of chronic disease   Lobar pneumonia [facility])    Hospital Course: Patient was admitted to the hospital on 10/17/2021 and discharged from 10/19/2021.  The patient was initially started on Rocephin and Zithromax for right lower lobe pneumonia.  Blood cultures came back staph and strep species.  Vancomycin was added.  Blood cultures finalized on 10/19/2021 with Staphylococcus epidermidis and Streptococcus Cristatus.  I believe both of these are contaminants and not a real infection because the patient did not appear septic.  Antibiotics switched over to [facility] and doxycycline for 4 more doses upon going home.  Patient was also seen by nephrology in the hospital and recommends follow-up with nephrology as outpatient.    Assessment and Plan: * CAP (community acquired pneumonia) Right lobar pneumonia. On vancomycin and Rocephin while in the hospital.  Blood cultures growing out staph epidermis and strep cristatus.  Since the patient never appeared septic or ill, I believe both of these organisms are skin contamination.  We will give Omnicef and doxycycline upon going home for 4 more days.   COPD (chronic obstructive pulmonary disease) ([place]) Patient has a history of COPD with chronic respiratory failure Continue oxygen supplementation for  chronic 4 L. Continue as needed bronchodilator therapy as well as inhaled steroids  Essential hypertension Blood pressure is stable Continue Coreg  Hypoglycemia Last hemoglobin A1c last month 6.7.  Can go back on long-acting insulin 5 units at night and 3 units prior to meals if sugars greater than 170  Hyperthyroidism Last TSH normal range.  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  Calcitrol 3 times a week.  Start sodium bicarb.  Last creatinine 4.86 with a GFR of 10.  Chronic respiratory failure with hypoxia (HCC) Continue oxygen supplementation, her chronic 4 L.         Consultants: Nephrology Procedures performed: None Disposition: Home Diet recommendation:  Cardiac and Carb modified diet DISCHARGE MEDICATION: Allergies as of 10/19/2021   No Known Allergies      Medication List     STOP taking these medications    amLODipine 5 MG tablet Commonly known as: NORVASC   methimazole 5 MG tablet Commonly known as: TAPAZOLE   Ozempic (1 MG/DOSE) 4 MG/3ML Sopn Generic drug: Semaglutide (1 MG/DOSE)   potassium chloride 10 MEQ tablet Commonly known as: KLOR-CON   spironolactone 25 MG tablet Commonly known as: ALDACTONE       TAKE these medications    Accu-Chek Guide test strip Generic drug: glucose blood USE TO CHECK BLOOD SUGAR THREE TIMES DAILY   Accu-Chek Guide w/Device Kit Use as instructed to check blood sugar 3 times a day. E11.59   Accu-Chek Softclix Lancets lancets  USE TO CHECK BLOOD SUGAR THREE TIMES DAILY AS INSTRUCTED   albuterol 108 (90 Base) MCG/ACT inhaler Commonly known as: VENTOLIN HFA Inhale 1-2 puffs into the lungs every 6 (six) hours as needed for wheezing or shortness of breath. What changed: Another medication with the same name was removed.  Continue taking this medication, and follow the directions you see here.   aspirin EC 81 MG tablet Take 81 mg by mouth daily.   atorvastatin 40 MG tablet Commonly known as: LIPITOR TAKE ONE TABLET BY MOUTH EVERY DAY What changed: when to take this   budesonide-formoterol 160-4.5 MCG/ACT inhaler Commonly known as: SYMBICORT Inhale 2 puffs into the lungs 2 (two) times daily.   calcitRIOL 0.25 MCG capsule Commonly known as: ROCALTROL Take 1 capsule (0.25 mcg total) by mouth 3 (three) times a week. Start taking on: October 20, 2021   carvedilol 25 MG tablet Commonly known as: COREG Take 1 tablet by mouth twice daily What changed: when to take this   cefdinir 300 MG capsule Commonly known as: OMNICEF Take 1 capsule (300 mg total) by mouth daily for 4 days. Start taking on: October 20, 2021   cetirizine 10 MG tablet Commonly known as: ZYRTEC Take 10 mg by mouth daily.   doxycycline 100 MG capsule Commonly known as: VIBRAMYCIN Take 1 capsule (100 mg total) by mouth 2 (two) times daily for 4 days.   esomeprazole 40 MG capsule Commonly known as: NEXIUM Take 40 mg by mouth daily. am   fluticasone 50 MCG/ACT nasal spray Commonly known as: FLONASE Place 1 spray into both nostrils daily.   ipratropium 0.06 % nasal spray Commonly known as: ATROVENT Place 1 spray into both nostrils 4 (four) times daily.   Lantus SoloStar 100 UNIT/ML Solostar Pen Generic drug: insulin glargine Inject 5 Units into the skin at bedtime. INJECT 30 UNITS SUBCUTANEOUSLY IN THE MORNING AND 30 UNITS AT NIGHT What changed: how much to take   NovoLOG FlexPen 100 UNIT/ML FlexPen Generic drug: insulin aspart Check sugars first if above 170 can give 3 units with meals What changed:  how much to take how to take this when to take this additional instructions   Nuvigil 50 MG tablet Generic drug: Armodafinil Take 1 tablet by mouth daily.   sodium bicarbonate 650 MG tablet Take 1 tablet (650 mg  total) by mouth 2 (two) times daily.   tiotropium 18 MCG inhalation capsule Commonly known as: SPIRIVA Place 18 mcg into inhaler and inhale daily.   torsemide 20 MG tablet Commonly known as: DEMADEX Take 20 mg by mouth daily.   Vitamin D (Ergocalciferol) 1.25 MG (50000 UNIT) Caps capsule Commonly known as: DRISDOL Take 50,000 Units by mouth every 7 (seven) days.        [place] Follow up in 5 day(s).   Contact information: [place] [address] [phone]         your nephrologist Follow up in 2 week(s).                 Discharge Exam: Filed Weights   10/17/21 0537 10/17/21 2211  Weight: 104.3 kg 103.2 kg   Physical Exam HENT:     Head: Normocephalic.     Mouth/Throat:     Pharynx: No oropharyngeal exudate.  Eyes:     General: Lids are normal.     Conjunctiva/sclera: Conjunctivae normal.  Cardiovascular:     Rate and Rhythm: Normal rate  and regular rhythm.     Heart sounds: Normal heart sounds, S1 normal and S2 normal.  Pulmonary:     Breath sounds: Examination of the right-lower field reveals decreased breath sounds and rhonchi. Examination of the left-lower field reveals decreased breath sounds. Decreased breath sounds and rhonchi present. No wheezing or rales.  Abdominal:     Palpations: Abdomen is soft.     Tenderness: There is no abdominal tenderness.  Musculoskeletal:     Right lower leg: No swelling.     Left lower leg: No swelling.  Skin:    General: Skin is warm.     Findings: No rash.  Neurological:     Mental Status: She is alert and oriented to person, place, and time.      Condition at discharge: stable  The results of significant diagnostics from this hospitalization (including imaging, microbiology, ancillary and laboratory) are listed below for reference.   Imaging Studies: DG Chest Portable 1 View  Result Date: 10/17/2021 CLINICAL DATA:  Weakness. EXAM:  PORTABLE CHEST 1 VIEW COMPARISON:  CT chest and chest x-ray dated June 14, 2021. FINDINGS: Unchanged mild cardiomegaly. New patchy consolidation in the right lower lobe with trace right pleural effusion. No pneumothorax. No acute osseous abnormality. IMPRESSION: 1. Right lower lobe pneumonia with trace right pleural effusion. Electronically Signed   By: Titus Dubin M.D.   On: 10/17/2021 09:48   US THYROID  Result Date: 10/13/2021 CLINICAL DATA:  Prior ultrasound follow-up. History of fine needle aspiration isthmic nodule performed 06/25/2016 EXAM: THYROID ULTRASOUND TECHNIQUE: Ultrasound examination of the thyroid gland and adjacent soft tissues was performed. COMPARISON:  05/07/2020; 05/12/2016 Ultrasound-guided isthmic nodule fine-needle aspiration-06/25/2016 FINDINGS: Parenchymal Echotexture: Mildly heterogenous Isthmus: Enlarged measuring 1 cm in diameter, unchanged Right lobe: Enlarged measuring 8.8 x 3.9 x 3.6 cm, previously, 9.9 x 3.9 x 3.4 cm Left lobe: Enlarged measuring 6.4 x 3.7 x 3.7 cm, previously, 8.6 x 3.3 x 3.0 cm _________________________________________________________ Estimated total number of nodules >/= 1 cm: >10 Number of spongiform nodules >/=  2 cm not described below (TR1): 0 Number of mixed cystic and solid nodules >/= 1.5 cm not described below ([place]): 0 _________________________________________________________ The previously biopsied 3.3 x 2.6 x 2.2 cm isoechoic ill-defined nodule/pseudonodule within the thyroid isthmus (labeled 1), is unchanged compared to the 05/2016 examination, previously, 3.3 x 2.7 x 1.7 cm. Correlation previous biopsy results is advised _________________________________________________________ Punctate (sub 1.5 cm) cystic, spongiform/benign and mixed cystic nodules scattered throughout the right lobe of the thyroid, many of which are unchanged compared to the 2018 examination, and none meet imaging criteria to recommend percutaneous sampling or continued  dedicated follow-up. _________________________________________________________ There is an approximately 2.1 x 1.4 x 1.2 cm spongiform/benign-appearing nodule within the mid aspect of the left lobe of the thyroid (labeled 7), which is unchanged compared to the 05/2020 examination and again does not meet criteria to recommend percutaneous sampling or continued dedicated follow-up. Additional scattered punctate (sub 1.3 cm) spongiform/benign-appearing nodules within the left lobe of the thyroid, many of which are unchanged compared to the 2018 examination, do not meet imaging criteria to recommend percutaneous sampling or continued dedicated follow-up. IMPRESSION: 1. Similar findings of thyromegaly and multinodular goiter. No worrisome new or enlarging thyroid nodules. 2. Previously biopsied isthmic nodule/pseudonodule is unchanged compared to the 2018 examination. Correlation with previous biopsy results is advised. Additionally, imaging stability for greater than 5 years is indicative of a benign etiology. 3. None of the additional discretely measured nodules/cysts, many of  which appear unchanged compared to the 2018 examination, meet imaging criteria to recommend percutaneous sampling or continued dedicated follow-up. The above is in keeping with the ACR TI-RADS recommendations - J Am Coll Radiol 2017;14:587-595. Electronically Signed   By: Sandi Mariscal M.D.   On: 10/13/2021 15:56    Microbiology: Results for orders placed or performed during the hospital encounter of 10/17/21  SARS Coronavirus 2 by RT PCR (hospital order, performed in [facility] hospital lab) *cepheid single result test* Anterior Nasal Swab     Status: None   Collection Time: 10/17/21  5:44 AM   Specimen: Anterior Nasal Swab  Result Value Ref Range Status   SARS Coronavirus 2 by RT PCR NEGATIVE NEGATIVE Final    Comment: (NOTE) SARS-CoV-2 target nucleic acids are NOT DETECTED.  The SARS-CoV-2 RNA is generally detectable in upper and  lower respiratory specimens during the acute phase of infection. The lowest concentration of SARS-CoV-2 viral copies this assay can detect is 250 copies / mL. A negative result does not preclude SARS-CoV-2 infection and should not be used as the sole basis for treatment or other patient management decisions.  A negative result may occur with improper specimen collection / handling, submission of specimen other than nasopharyngeal swab, presence of viral mutation(s) within the areas targeted by this assay, and inadequate number of viral copies (<250 copies / mL). A negative result must be combined with clinical observations, patient history, and epidemiological information.  Fact Sheet for Patients:   https://www.patel.info/  Fact Sheet for Healthcare Providers: https://hall.com/  This test is not yet approved or  cleared by the [place] FDA and has been authorized for detection and/or diagnosis of SARS-CoV-2 by FDA under an Emergency Use Authorization (EUA).  This EUA will remain in effect (meaning this test can be used) for the duration of the COVID-19 declaration under Section 564(b)(1) of the Act, 21 U.S.C. section 360bbb-3(b)(1), unless the authorization is terminated or revoked sooner.  Performed at [facility], [address] [place], [place] [address]   Resp Panel by RT-PCR (Flu A&B, Covid) Anterior Nasal Swab     Status: None   Collection Time: 10/17/21 10:23 AM   Specimen: Anterior Nasal Swab  Result Value Ref Range Status   SARS Coronavirus 2 by RT PCR NEGATIVE NEGATIVE Final    Comment: (NOTE) SARS-CoV-2 target nucleic acids are NOT DETECTED.  The SARS-CoV-2 RNA is generally detectable in upper respiratory specimens during the acute phase of infection. The lowest concentration of SARS-CoV-2 viral copies this assay can detect is 138 copies/mL. A negative result does not preclude SARS-Cov-2 infection and  should not be used as the sole basis for treatment or other patient management decisions. A negative result may occur with  improper specimen collection/handling, submission of specimen other than nasopharyngeal swab, presence of viral mutation(s) within the areas targeted by this assay, and inadequate number of viral copies(<138 copies/mL). A negative result must be combined with clinical observations, patient history, and epidemiological information. The expected result is Negative.  Fact Sheet for Patients:  EntrepreneurPulse.com.au  Fact Sheet for Healthcare Providers:  IncredibleEmployment.be  This test is no t yet approved or cleared by the [place] FDA and  has been authorized for detection and/or diagnosis of SARS-CoV-2 by FDA under an Emergency Use Authorization (EUA). This EUA will remain  in effect (meaning this test can be used) for the duration of the COVID-19 declaration under Section 564(b)(1) of the Act, 21 U.S.C.section 360bbb-3(b)(1), unless the authorization  is terminated  or revoked sooner.       Influenza A by PCR NEGATIVE NEGATIVE Final   Influenza B by PCR NEGATIVE NEGATIVE Final    Comment: (NOTE) The Xpert Xpress SARS-CoV-2/FLU/RSV plus assay is intended as an aid in the diagnosis of influenza from Nasopharyngeal swab specimens and should not be used as a sole basis for treatment. Nasal washings and aspirates are unacceptable for Xpert Xpress SARS-CoV-2/FLU/RSV testing.  Fact Sheet for Patients: EntrepreneurPulse.com.au  Fact Sheet for Healthcare Providers: IncredibleEmployment.be  This test is not yet approved or cleared by the [place] FDA and has been authorized for detection and/or diagnosis of SARS-CoV-2 by FDA under an Emergency Use Authorization (EUA). This EUA will remain in effect (meaning this test can be used) for the duration of the COVID-19 declaration  under Section 564(b)(1) of the Act, 21 U.S.C. section 360bbb-3(b)(1), unless the authorization is terminated or revoked.  Performed at [facility], [address] [place], [place] [address]   Blood Culture (routine x 2)     Status: Abnormal (Preliminary result)   Collection Time: 10/17/21 10:23 AM   Specimen: Right Antecubital; Blood  Result Value Ref Range Status   Specimen Description   Final    RIGHT ANTECUBITAL BLOOD Performed at [facility], [place] [address], [place], [place] [address]    Special Requests   Final    BOTTLES DRAWN AEROBIC AND ANAEROBIC Blood Culture adequate volume Performed at [facility], [address] [place], [place] [address]    Culture  Setup Time   Final    GRAM POSITIVE COCCI IN BOTH AEROBIC AND ANAEROBIC BOTTLES Organism ID to follow CRITICAL RESULT CALLED TO, READ BACK BY AND VERIFIED WITH: JASON ROBBINS @ 0321 10/18/21 LFD Performed at [facility], [address] [place], [place] [address]    Culture (A)  Final    STAPHYLOCOCCUS EPIDERMIDIS STREPTOCOCCUS CRISTATUS THE SIGNIFICANCE OF ISOLATING THIS ORGANISM FROM A SINGLE SET OF BLOOD CULTURES WHEN MULTIPLE SETS ARE DRAWN IS UNCERTAIN. PLEASE NOTIFY THE MICROBIOLOGY DEPARTMENT WITHIN ONE WEEK IF SPECIATION AND SENSITIVITIES ARE REQUIRED. Performed at [facility], [place] [address] [place], [place] [address]    Report Status PENDING  Incomplete  Blood Culture ID Panel (Reflexed)     Status: Abnormal   Collection Time: 10/17/21 10:23 AM  Result Value Ref Range Status   Enterococcus faecalis NOT DETECTED NOT DETECTED Final   Enterococcus Faecium NOT DETECTED NOT DETECTED Final   Listeria monocytogenes NOT DETECTED NOT DETECTED Final   Staphylococcus species DETECTED (A) NOT DETECTED Final    Comment: CRITICAL RESULT CALLED TO, READ BACK BY AND VERIFIED WITH: JASON ROBBINS @ 0321 10/18/21 LFD    Staphylococcus aureus (BCID) NOT DETECTED NOT DETECTED  Final   Staphylococcus epidermidis NOT DETECTED NOT DETECTED Final   Staphylococcus lugdunensis NOT DETECTED NOT DETECTED Final   Streptococcus species DETECTED (A) NOT DETECTED Final    Comment: Not Enterococcus species, Streptococcus agalactiae, Streptococcus pyogenes, or Streptococcus pneumoniae. CRITICAL RESULT CALLED TO, READ BACK BY AND VERIFIED WITH: JASON ROBBINS @ 0321 10/18/21 LFD    Streptococcus agalactiae NOT DETECTED NOT DETECTED Final   Streptococcus pneumoniae NOT DETECTED NOT DETECTED Final   Streptococcus pyogenes NOT DETECTED NOT DETECTED Final   A.calcoaceticus-baumannii NOT DETECTED NOT DETECTED Final   Bacteroides fragilis NOT DETECTED NOT DETECTED Final   Enterobacterales NOT DETECTED NOT DETECTED Final   Enterobacter cloacae complex NOT DETECTED NOT DETECTED Final  Escherichia coli NOT DETECTED NOT DETECTED Final   Klebsiella aerogenes NOT DETECTED NOT DETECTED Final   Klebsiella oxytoca NOT DETECTED NOT DETECTED Final   Klebsiella pneumoniae NOT DETECTED NOT DETECTED Final   Proteus species NOT DETECTED NOT DETECTED Final   Salmonella species NOT DETECTED NOT DETECTED Final   Serratia marcescens NOT DETECTED NOT DETECTED Final   Haemophilus influenzae NOT DETECTED NOT DETECTED Final   Neisseria meningitidis NOT DETECTED NOT DETECTED Final   Pseudomonas aeruginosa NOT DETECTED NOT DETECTED Final   Stenotrophomonas maltophilia NOT DETECTED NOT DETECTED Final   Candida albicans NOT DETECTED NOT DETECTED Final   Candida auris NOT DETECTED NOT DETECTED Final   Candida glabrata NOT DETECTED NOT DETECTED Final   Candida krusei NOT DETECTED NOT DETECTED Final   Candida parapsilosis NOT DETECTED NOT DETECTED Final   Candida tropicalis NOT DETECTED NOT DETECTED Final   Cryptococcus neoformans/gattii NOT DETECTED NOT DETECTED Final    Comment: Performed at [facility], [address] [place], [place] [address]  Blood Culture (routine x 2)     Status: None  (Preliminary result)   Collection Time: 10/17/21 11:31 AM   Specimen: BLOOD  Result Value Ref Range Status   Specimen Description   Final    BLOOD Blood Culture results may not be optimal due to an inadequate volume of blood received in culture bottles   Special Requests BOTTLES DRAWN AEROBIC AND ANAEROBIC WRIST  Final   Culture   Final    NO GROWTH 2 DAYS Performed at [facility], [address] [place], [place] [address]    Report Status PENDING  Incomplete  MRSA Next Gen by PCR, Nasal     Status: None   Collection Time: 10/19/21  8:41 AM   Specimen: Nasal Mucosa; Nasal Swab  Result Value Ref Range Status   MRSA by PCR Next Gen NOT DETECTED NOT DETECTED Final    Comment: (NOTE) The GeneXpert MRSA Assay (FDA approved for NASAL specimens only), is one component of a comprehensive MRSA colonization surveillance program. It is not intended to diagnose MRSA infection nor to guide or monitor treatment for MRSA infections. Test performance is not FDA approved in patients less than 51 years old. Performed at [facility], [address] [place], [place] [address]     Labs: CBC: Recent Labs  Lab 10/17/21 0544 10/18/21 0433 10/19/21 0617  WBC 8.0 6.6  --   NEUTROABS 5.8  --   --   HGB 10.1* 8.8* 8.7*  HCT 34.8* 30.3*  --   MCV 94.6 93.8  --   PLT 237 213  --    Basic Metabolic Panel: Recent Labs  Lab 10/17/21 0544 10/18/21 0433 10/19/21 0617  NA 144 143 142  K 4.6 4.7 4.4  CL 116* 118* 115*  CO2 23 21* 20*  GLUCOSE 110* 103* 130*  BUN 40* 41* 46*  CREATININE 4.37* 4.46* 4.86*  CALCIUM 8.8* 8.3* 8.3*   Liver Function Tests: Recent Labs  Lab 10/17/21 0544  AST 11*  ALT 9  ALKPHOS 102  BILITOT 0.5  PROT 6.9  ALBUMIN 2.9*   CBG: Recent Labs  Lab 10/18/21 1229 10/18/21 1742 10/18/21 2054 10/19/21 0851 10/19/21 1231  GLUCAP 136* 115* 182* 104* 125*    Discharge time spent: greater than 30 minutes.  Signed: Loletha Grayer,  MD [facility] 10/19/2021

## 2021-10-19 NOTE — Progress Notes (Signed)
Pharmacy Antibiotic Note  Emily [name] is a 61 y.o. female admitted on 10/17/2021 with pneumonia and bacteremia.  Pharmacy has been consulted for Vancomycin dosing.   Pt has Stage V CKD ,  Pt has agreed to start renal replacement therapy.   No HD of PD set at this time.   -10/14: nephrology following, no dialysis/renal replacement at this time   Plan: Vancomycin 2500 mg IV X 1 ordered for 10/14 @ 0500.    10/15 '@0617'$ : random Vancomycin level= 28 mcg/ml. Will reassess Vancomycin level tomorrow am 10/16. Anticipate redosing when level <20 mcg/ml. If Scr still stable (see nephrology note) would calculate maintence dose and schedule.  F/u Blood cx, renal fxn, MRSA PCR   Height: '5\' 4"'$  (162.6 cm) Weight: 103.2 kg (227 lb 8.2 oz) IBW/kg (Calculated) : 54.7  Temp (24hrs), Avg:98.1 F (36.7 C), Min:97.9 F (36.6 C), Max:98.3 F (36.8 C)  Recent Labs  Lab 10/17/21 0544 10/17/21 1023 10/18/21 0433 10/19/21 0617  WBC 8.0  --  6.6  --   CREATININE 4.37*  --  4.46* 4.86*  LATICACIDVEN  --  0.7  --   --   VANCORANDOM  --   --   --  28     Estimated Creatinine Clearance: 14 mL/min (A) (by C-G formula based on SCr of 4.86 mg/dL (H)).    No Known Allergies  Antimicrobials this admission:   Cefepime/Azithromycin x 1 each 10/13 Ceftriaxone 10/14>> Vanc 10/14>>  Dose adjustments this admission:   Microbiology results: 10/13 BCx: 2of4 (1 set);  GPC,  BCID: staph species  UCx:    Sputum:    MRSA PCR: pend  Thank you for allowing pharmacy to be a part of this patient's care.  Emerly Prak A 10/19/2021 7:52 AM

## 2021-10-20 LAB — CULTURE, BLOOD (ROUTINE X 2): Special Requests: ADEQUATE

## 2021-10-21 ENCOUNTER — Other Ambulatory Visit: Payer: Self-pay | Admitting: Pulmonary Disease

## 2021-10-21 DIAGNOSIS — M79662 Pain in left lower leg: Secondary | ICD-10-CM

## 2021-10-22 LAB — CULTURE, BLOOD (ROUTINE X 2): Culture: NO GROWTH

## 2021-10-23 ENCOUNTER — Ambulatory Visit: Payer: Medicaid Other | Admitting: Podiatry

## 2021-10-23 ENCOUNTER — Encounter: Payer: Self-pay | Admitting: Podiatry

## 2021-10-23 DIAGNOSIS — B351 Tinea unguium: Secondary | ICD-10-CM

## 2021-10-23 DIAGNOSIS — E1159 Type 2 diabetes mellitus with other circulatory complications: Secondary | ICD-10-CM

## 2021-10-23 DIAGNOSIS — N189 Chronic kidney disease, unspecified: Secondary | ICD-10-CM

## 2021-10-23 DIAGNOSIS — M79674 Pain in right toe(s): Secondary | ICD-10-CM

## 2021-10-23 DIAGNOSIS — I872 Venous insufficiency (chronic) (peripheral): Secondary | ICD-10-CM | POA: Diagnosis not present

## 2021-10-23 DIAGNOSIS — M79675 Pain in left toe(s): Secondary | ICD-10-CM

## 2021-10-23 NOTE — Progress Notes (Signed)
Complaint:  Visit Type: Patient returns to my office for continued preventative foot care services. Complaint: Patient states" my nails have grown long and thick and become painful to walk and wear shoes" Patient has been diagnosed with DM with vascular disease and chronic venous insufficiency and chronic kidney disease.. The patient presents for preventative foot care services.  Podiatric Exam: Vascular: dorsalis pedis and posterior tibial pulses are weakly  palpable bilateral. Capillary return is immediate. Temperature gradient is WNL. Skin turgor WNL  Sensorium: Normal Semmes Weinstein monofilament test. Normal tactile sensation bilaterally. Nail Exam: Pt has thick disfigured discolored nails with subungual debris noted bilateral entire nail hallux through fifth toenails Ulcer Exam: There is no evidence of ulcer or pre-ulcerative changes or infection. Orthopedic Exam: Muscle tone and strength are WNL. No limitations in general ROM. No crepitus or effusions noted. Foot type and digits show no abnormalities. Bony prominences are unremarkable. Skin: No Porokeratosis. No infection or ulcers  Diagnosis:  Onychomycosis, , Pain in right toe, pain in left toes  Treatment & Plan Procedures and Treatment: Consent by patient was obtained for treatment procedures.   Debridement of mycotic and hypertrophic toenails, 1 through 5 bilateral and clearing of subungual debris. No ulceration, no infection noted.  Return Visit-Office Procedure: Patient instructed to return to the office for a follow up visit 3 months for continued evaluation and treatment. RTC 3 months    Gardiner Barefoot DPM

## 2021-10-27 ENCOUNTER — Ambulatory Visit
Admission: RE | Admit: 2021-10-27 | Discharge: 2021-10-27 | Disposition: A | Discharge status: Home / Self Care | Payer: Medicaid Other | Source: Ambulatory Visit | Attending: Pulmonary Disease | Admitting: Pulmonary Disease

## 2021-10-27 DIAGNOSIS — M79662 Pain in left lower leg: Secondary | ICD-10-CM | POA: Diagnosis present

## 2021-10-27 DIAGNOSIS — M7989 Other specified soft tissue disorders: Secondary | ICD-10-CM | POA: Insufficient documentation

## 2021-11-04 ENCOUNTER — Telehealth (INDEPENDENT_AMBULATORY_CARE_PROVIDER_SITE_OTHER): Payer: Self-pay

## 2021-11-04 NOTE — Telephone Encounter (Signed)
Spoke with the patient and she is scheduled with Dr. Delana Meyer on 11/26/21 for a left brachial cephalic AV fistula at the [facility]. Pre-op phone call is on 11/17/21 between 1-5 pm. Pre-surgical instructions  were discussed and will be mailed.

## 2021-11-17 ENCOUNTER — Encounter
Admission: RE | Admit: 2021-11-17 | Discharge: 2021-11-17 | Disposition: A | Discharge status: Home / Self Care | Payer: Medicaid Other | Source: Ambulatory Visit | Attending: Vascular Surgery | Admitting: Vascular Surgery

## 2021-11-17 ENCOUNTER — Other Ambulatory Visit (INDEPENDENT_AMBULATORY_CARE_PROVIDER_SITE_OTHER): Payer: Self-pay | Admitting: Nurse Practitioner

## 2021-11-17 VITALS — Ht 64.0 in | Wt 223.8 lb

## 2021-11-17 DIAGNOSIS — E1122 Type 2 diabetes mellitus with diabetic chronic kidney disease: Secondary | ICD-10-CM

## 2021-11-17 DIAGNOSIS — Z01818 Encounter for other preprocedural examination: Secondary | ICD-10-CM

## 2021-11-17 DIAGNOSIS — E1159 Type 2 diabetes mellitus with other circulatory complications: Secondary | ICD-10-CM

## 2021-11-17 DIAGNOSIS — I890 Lymphedema, not elsewhere classified: Secondary | ICD-10-CM

## 2021-11-17 DIAGNOSIS — Z01812 Encounter for preprocedural laboratory examination: Secondary | ICD-10-CM

## 2021-11-17 DIAGNOSIS — J449 Chronic obstructive pulmonary disease, unspecified: Secondary | ICD-10-CM

## 2021-11-17 DIAGNOSIS — D638 Anemia in other chronic diseases classified elsewhere: Secondary | ICD-10-CM

## 2021-11-17 DIAGNOSIS — I5032 Chronic diastolic (congestive) heart failure: Secondary | ICD-10-CM

## 2021-11-17 DIAGNOSIS — I10 Essential (primary) hypertension: Secondary | ICD-10-CM

## 2021-11-17 HISTORY — DX: Venous insufficiency (chronic) (peripheral): I87.2

## 2021-11-17 HISTORY — DX: Dependence on supplemental oxygen: Z99.81

## 2021-11-17 HISTORY — DX: Thyrotoxicosis, unspecified without thyrotoxic crisis or storm: E05.90

## 2021-11-17 HISTORY — DX: Obstructive sleep apnea (adult) (pediatric): G47.33

## 2021-11-17 HISTORY — DX: Chronic respiratory failure with hypoxia: J96.11

## 2021-11-17 HISTORY — DX: Anemia, unspecified: D64.9

## 2021-11-17 HISTORY — DX: Severe sepsis without septic shock: J96.01

## 2021-11-17 HISTORY — DX: End stage renal disease: N18.6

## 2021-11-17 HISTORY — DX: Lymphedema, not elsewhere classified: I89.0

## 2021-11-17 NOTE — Patient Instructions (Signed)
Your procedure is scheduled on:11-26-21 Wednesday Report to the Registration Desk on the 1st floor of the [facility].Then proceed to the 2nd floor Surgery Desk To find out your arrival time, please call [phone] between 1PM - 3PM on:11-25-21 Tuesday If your arrival time is 6:00 am, do not arrive prior to that time as the [place] entrance doors do not open until 6:00 am.  REMEMBER: Instructions that are not followed completely may result in serious medical risk, up to and including death; or upon the discretion of your surgeon and anesthesiologist your surgery may need to be rescheduled.  Do not eat food OR drink any liquids after midnight the night before surgery.  No gum chewing, lozengers or hard candies.  TAKE THESE MEDICATIONS THE MORNING OF SURGERY WITH A SIP OF WATER: -carvedilol (COREG)  -esomeprazole ([place]) -take one the night before and one on the morning of surgery - helps to prevent nausea after surgery.)  Continue your 81 mg Aspirin up until the day prior to surgery-Do NOT take the morning of surgery  Take half of your Lantus Insulin (19 units) the night before your surgery and NO insulin the morning of your surgery  Use your Albuterol, Spiriva and Symbicort Inhalers the morning of surgery and bring your Albuterol Inhaler to the hospital  One week prior to surgery: Stop Anti-inflammatories (NSAIDS) such as Advil, Aleve, Ibuprofen, Motrin, Naproxen, Naprosyn and Aspirin based products such as Excedrin, Goodys Powder, BC Powder.You may however, continue to take Tylenol if needed for pain up until the day of surgery.  Stop ANY OVER THE COUNTER supplements/vitamins NOW (11-17-21) until after surgery.  No Alcohol for 24 hours before or after surgery.  No Smoking including e-cigarettes for 24 hours prior to surgery.  No chewable tobacco products for at least 6 hours prior to surgery.  No nicotine patches on the day of surgery.  Do not use any "recreational" drugs  for at least a week prior to your surgery.  Please be advised that the combination of cocaine and anesthesia may have negative outcomes, up to and including death. If you test positive for cocaine, your surgery will be cancelled.  On the morning of surgery brush your teeth with toothpaste and water, you may rinse your mouth with mouthwash if you wish. Do not swallow any toothpaste or mouthwash.  Use CHG Soap as directed on instruction sheet.  Do not wear jewelry, make-up, hairpins, clips or nail polish.  Do not wear lotions, powders, or perfumes.   Do not shave body from the neck down 48 hours prior to surgery just in case you cut yourself which could leave a site for infection.  Also, freshly shaved skin may become irritated if using the CHG soap.  Contact lenses, hearing aids and dentures may not be worn into surgery.  Do not bring valuables to the hospital. [facility] is not responsible for any missing/lost belongings or valuables.   Bring your C-PAP to the hospital with you  Notify your doctor if there is any change in your medical condition (cold, fever, infection).  Wear comfortable clothing (specific to your surgery type) to the hospital.  After surgery, you can help prevent lung complications by doing breathing exercises.  Take deep breaths and cough every 1-2 hours. Your doctor may order a device called an Incentive Spirometer to help you take deep breaths. When coughing or sneezing, hold a pillow firmly against your incision with both hands. This is called "splinting." Doing this helps protect  your incision. It also decreases belly discomfort.  If you are being admitted to the hospital overnight, leave your suitcase in the car. After surgery it may be brought to your room.  If you are being discharged the day of surgery, you will not be allowed to drive home. You will need a responsible adult (18 years or older) to drive you home and stay with you that night.   If you  are taking public transportation, you will need to have a responsible adult (18 years or older) with you. Please confirm with your physician that it is acceptable to use public transportation.   Please call the [place] Dept. at [phone] if you have any questions about these instructions.  Surgery Visitation Policy:  Patients undergoing a surgery or procedure may have two family members or support persons with them as long as the person is not COVID-19 positive or experiencing its symptoms.   MASKING: Due to an increase in RSV rates and hospitalizations, starting Wednesday, Nov. 15, in patient care areas in which we serve newborns, infants and children, masks will be required for teammates and visitors.  Children ages 10 and under may not visit. This policy affects the following departments only:  [place] Postpartum area Mother Baby Unit Newborn nursery/Special care nursery  Other areas: Masks continue to be strongly recommended for [place] teammates, visitors and patients in all other areas. Visitation is not restricted outside of the units listed above.

## 2021-11-18 ENCOUNTER — Encounter
Admission: RE | Admit: 2021-11-18 | Discharge: 2021-11-18 | Disposition: A | Discharge status: Home / Self Care | Payer: Medicaid Other | Source: Ambulatory Visit | Attending: Vascular Surgery | Admitting: Vascular Surgery

## 2021-11-18 DIAGNOSIS — I132 Hypertensive heart and chronic kidney disease with heart failure and with stage 5 chronic kidney disease, or end stage renal disease: Secondary | ICD-10-CM | POA: Diagnosis not present

## 2021-11-18 DIAGNOSIS — D631 Anemia in chronic kidney disease: Secondary | ICD-10-CM | POA: Insufficient documentation

## 2021-11-18 DIAGNOSIS — I5032 Chronic diastolic (congestive) heart failure: Secondary | ICD-10-CM | POA: Insufficient documentation

## 2021-11-18 DIAGNOSIS — N185 Chronic kidney disease, stage 5: Secondary | ICD-10-CM | POA: Insufficient documentation

## 2021-11-18 DIAGNOSIS — Z01818 Encounter for other preprocedural examination: Secondary | ICD-10-CM | POA: Diagnosis present

## 2021-11-18 DIAGNOSIS — E1122 Type 2 diabetes mellitus with diabetic chronic kidney disease: Secondary | ICD-10-CM | POA: Diagnosis not present

## 2021-11-18 DIAGNOSIS — J449 Chronic obstructive pulmonary disease, unspecified: Secondary | ICD-10-CM | POA: Insufficient documentation

## 2021-11-18 DIAGNOSIS — I10 Essential (primary) hypertension: Secondary | ICD-10-CM

## 2021-11-18 DIAGNOSIS — D638 Anemia in other chronic diseases classified elsewhere: Secondary | ICD-10-CM

## 2021-11-18 DIAGNOSIS — E1159 Type 2 diabetes mellitus with other circulatory complications: Secondary | ICD-10-CM | POA: Diagnosis not present

## 2021-11-18 DIAGNOSIS — Z01812 Encounter for preprocedural laboratory examination: Secondary | ICD-10-CM

## 2021-11-18 LAB — CBC
HCT: 31.2 % — ABNORMAL LOW (ref 36.0–46.0)
Hemoglobin: 9.4 g/dL — ABNORMAL LOW (ref 12.0–15.0)
MCH: 26.7 pg (ref 26.0–34.0)
MCHC: 30.1 g/dL (ref 30.0–36.0)
MCV: 88.6 fL (ref 80.0–100.0)
Platelets: 314 10*3/uL (ref 150–400)
RBC: 3.52 MIL/uL — ABNORMAL LOW (ref 3.87–5.11)
RDW: 13.4 % (ref 11.5–15.5)
WBC: 8.3 10*3/uL (ref 4.0–10.5)
nRBC: 0 % (ref 0.0–0.2)

## 2021-11-18 LAB — BASIC METABOLIC PANEL
Anion gap: 8 (ref 5–15)
BUN: 30 mg/dL — ABNORMAL HIGH (ref 8–23)
CO2: 25 mmol/L (ref 22–32)
Calcium: 8.4 mg/dL — ABNORMAL LOW (ref 8.9–10.3)
Chloride: 111 mmol/L (ref 98–111)
Creatinine, Ser: 4.02 mg/dL — ABNORMAL HIGH (ref 0.44–1.00)
GFR, Estimated: 12 mL/min — ABNORMAL LOW (ref >60)
Glucose, Bld: 92 mg/dL (ref 70–99)
Potassium: 3.1 mmol/L — ABNORMAL LOW (ref 3.5–5.1)
Sodium: 144 mmol/L (ref 135–145)

## 2021-11-18 LAB — TYPE AND SCREEN
ABO/RH(D): O POS
Antibody Screen: NEGATIVE

## 2021-11-18 NOTE — Progress Notes (Addendum)
  [facility] Perioperative Services: Pre-Admission/Anesthesia Testing  Abnormal Lab Notification   Date: 11/18/21  Name: Alegra Rost MRN:   [id number]  Re: Abnormal labs noted during PAT appointment   Notified:  Provider Name Provider Role Notification Mode  Hortencia Pilar, MD Vascular Surgery Routed and/or faxed via [place], FNP-C Vascular Surgery APP Routed and/or faxed via [facility] and Notes:  ABNORMAL LAB VALUE(S): Lab Results  Component Value Date   K 3.1 (L) 11/18/2021   Jesenia Spera is scheduled for a BRACHIOCEPHALIC AV FISTULA CREATION on 11/26/2021. In review of her medication reconciliation, it is noted that the patient is taking prescribed diuretic medications (torsemide). Additionally, she uses a SABA MDI (albuterol), which could potentially be implicated in her noted hypokalemia. She is not on a daily oral K+ supplement at this time.   Please note, in efforts to promote a safe and effective anesthetic course, per current guidelines/standards set by the [facility] anesthesia team, the minimal acceptable K+ level for the patient to proceed with general anesthesia is 3.0 mmol/L. With that being said, if the patient drops any lower, her elective procedure will need to be postponed until K+ is better optimized. Abnormal result is being forwarded to primary attending surgeon for review and consideration of optimization. Order placed to have K+ rechecked on the day of her procedure to ensure correction of the noted derangement.    Honor Loh, MSN, APRN, FNP-C, CEN [facility]  Peri-operative Services Nurse Practitioner Phone: [phone] Fax: [phone] 11/18/21 11:16 AM

## 2021-11-22 ENCOUNTER — Encounter: Payer: Self-pay | Admitting: Intensive Care

## 2021-11-22 ENCOUNTER — Emergency Department: Payer: Medicaid Other

## 2021-11-22 ENCOUNTER — Other Ambulatory Visit: Payer: Self-pay

## 2021-11-22 ENCOUNTER — Observation Stay
Admission: EM | Admit: 2021-11-22 | Discharge: 2021-11-24 | Disposition: A | Discharge status: Home / Self Care | Payer: Medicaid Other | Attending: Internal Medicine | Admitting: Internal Medicine

## 2021-11-22 DIAGNOSIS — I10 Essential (primary) hypertension: Secondary | ICD-10-CM | POA: Diagnosis present

## 2021-11-22 DIAGNOSIS — E1159 Type 2 diabetes mellitus with other circulatory complications: Secondary | ICD-10-CM | POA: Diagnosis not present

## 2021-11-22 DIAGNOSIS — J45909 Unspecified asthma, uncomplicated: Secondary | ICD-10-CM | POA: Diagnosis not present

## 2021-11-22 DIAGNOSIS — Z8673 Personal history of transient ischemic attack (TIA), and cerebral infarction without residual deficits: Secondary | ICD-10-CM | POA: Insufficient documentation

## 2021-11-22 DIAGNOSIS — Z1152 Encounter for screening for COVID-19: Secondary | ICD-10-CM | POA: Diagnosis not present

## 2021-11-22 DIAGNOSIS — I509 Heart failure, unspecified: Secondary | ICD-10-CM | POA: Insufficient documentation

## 2021-11-22 DIAGNOSIS — E039 Hypothyroidism, unspecified: Secondary | ICD-10-CM | POA: Insufficient documentation

## 2021-11-22 DIAGNOSIS — J441 Chronic obstructive pulmonary disease with (acute) exacerbation: Secondary | ICD-10-CM | POA: Diagnosis not present

## 2021-11-22 DIAGNOSIS — Z794 Long term (current) use of insulin: Secondary | ICD-10-CM | POA: Insufficient documentation

## 2021-11-22 DIAGNOSIS — G4730 Sleep apnea, unspecified: Secondary | ICD-10-CM | POA: Diagnosis present

## 2021-11-22 DIAGNOSIS — M79604 Pain in right leg: Secondary | ICD-10-CM | POA: Diagnosis present

## 2021-11-22 DIAGNOSIS — R0602 Shortness of breath: Secondary | ICD-10-CM | POA: Diagnosis present

## 2021-11-22 DIAGNOSIS — I132 Hypertensive heart and chronic kidney disease with heart failure and with stage 5 chronic kidney disease, or end stage renal disease: Secondary | ICD-10-CM | POA: Diagnosis not present

## 2021-11-22 DIAGNOSIS — D638 Anemia in other chronic diseases classified elsewhere: Secondary | ICD-10-CM | POA: Diagnosis present

## 2021-11-22 DIAGNOSIS — J9611 Chronic respiratory failure with hypoxia: Secondary | ICD-10-CM | POA: Diagnosis not present

## 2021-11-22 DIAGNOSIS — E1122 Type 2 diabetes mellitus with diabetic chronic kidney disease: Secondary | ICD-10-CM | POA: Diagnosis not present

## 2021-11-22 DIAGNOSIS — E1165 Type 2 diabetes mellitus with hyperglycemia: Secondary | ICD-10-CM | POA: Diagnosis not present

## 2021-11-22 DIAGNOSIS — J449 Chronic obstructive pulmonary disease, unspecified: Secondary | ICD-10-CM | POA: Diagnosis present

## 2021-11-22 DIAGNOSIS — M79605 Pain in left leg: Secondary | ICD-10-CM | POA: Insufficient documentation

## 2021-11-22 DIAGNOSIS — N186 End stage renal disease: Secondary | ICD-10-CM | POA: Diagnosis not present

## 2021-11-22 DIAGNOSIS — E0590 Thyrotoxicosis, unspecified without thyrotoxic crisis or storm: Secondary | ICD-10-CM | POA: Diagnosis present

## 2021-11-22 DIAGNOSIS — D631 Anemia in chronic kidney disease: Secondary | ICD-10-CM | POA: Diagnosis not present

## 2021-11-22 DIAGNOSIS — Z87891 Personal history of nicotine dependence: Secondary | ICD-10-CM | POA: Insufficient documentation

## 2021-11-22 DIAGNOSIS — Z7982 Long term (current) use of aspirin: Secondary | ICD-10-CM | POA: Insufficient documentation

## 2021-11-22 DIAGNOSIS — I890 Lymphedema, not elsewhere classified: Secondary | ICD-10-CM

## 2021-11-22 DIAGNOSIS — Z79899 Other long term (current) drug therapy: Secondary | ICD-10-CM | POA: Diagnosis not present

## 2021-11-22 DIAGNOSIS — E785 Hyperlipidemia, unspecified: Secondary | ICD-10-CM | POA: Diagnosis present

## 2021-11-22 LAB — RESP PANEL BY RT-PCR (FLU A&B, COVID) ARPGX2
Influenza A by PCR: NEGATIVE
Influenza B by PCR: NEGATIVE
SARS Coronavirus 2 by RT PCR: NEGATIVE

## 2021-11-22 LAB — CBC
HCT: 29 % — ABNORMAL LOW (ref 36.0–46.0)
Hemoglobin: 8.7 g/dL — ABNORMAL LOW (ref 12.0–15.0)
MCH: 26.7 pg (ref 26.0–34.0)
MCHC: 30 g/dL (ref 30.0–36.0)
MCV: 89 fL (ref 80.0–100.0)
Platelets: 256 10*3/uL (ref 150–400)
RBC: 3.26 MIL/uL — ABNORMAL LOW (ref 3.87–5.11)
RDW: 13.6 % (ref 11.5–15.5)
WBC: 8.1 10*3/uL (ref 4.0–10.5)
nRBC: 0 % (ref 0.0–0.2)

## 2021-11-22 LAB — BASIC METABOLIC PANEL
Anion gap: 7 (ref 5–15)
BUN: 46 mg/dL — ABNORMAL HIGH (ref 8–23)
CO2: 23 mmol/L (ref 22–32)
Calcium: 8.4 mg/dL — ABNORMAL LOW (ref 8.9–10.3)
Chloride: 114 mmol/L — ABNORMAL HIGH (ref 98–111)
Creatinine, Ser: 5.05 mg/dL — ABNORMAL HIGH (ref 0.44–1.00)
GFR, Estimated: 9 mL/min — ABNORMAL LOW (ref >60)
Glucose, Bld: 189 mg/dL — ABNORMAL HIGH (ref 70–99)
Potassium: 3.5 mmol/L (ref 3.5–5.1)
Sodium: 144 mmol/L (ref 135–145)

## 2021-11-22 LAB — TROPONIN I (HIGH SENSITIVITY)
Troponin I (High Sensitivity): 53 ng/L — ABNORMAL HIGH (ref <18)
Troponin I (High Sensitivity): 55 ng/L — ABNORMAL HIGH (ref <18)

## 2021-11-22 MED ORDER — IPRATROPIUM-ALBUTEROL 0.5-2.5 (3) MG/3ML IN SOLN
3.0000 mL | Freq: Once | RESPIRATORY_TRACT | Status: AC
  Administered 2021-11-22: 3 mL via RESPIRATORY_TRACT
  Filled 2021-11-22: qty 3

## 2021-11-22 MED ORDER — METHYLPREDNISOLONE SODIUM SUCC 125 MG IJ SOLR
125.0000 mg | Freq: Once | INTRAMUSCULAR | Status: AC
  Administered 2021-11-22: 125 mg via INTRAVENOUS
  Filled 2021-11-22: qty 2

## 2021-11-22 MED ORDER — SODIUM CHLORIDE 0.9 % IV SOLN
500.0000 mg | Freq: Once | INTRAVENOUS | Status: AC
  Administered 2021-11-22: 500 mg via INTRAVENOUS
  Filled 2021-11-22: qty 5

## 2021-11-22 NOTE — ED Notes (Signed)
Pt was walked in room on home O2 (4L), O2 level dropped to 88% quickly before RN placed pt back in bed.  She had developed some labored breathing however quickly recovered as soon as she was placed back in the bed (95%).

## 2021-11-22 NOTE — ED Provider Notes (Signed)
[facility] Provider Note    Event Date/Time   First MD Initiated Contact with Patient 11/22/21 1918     (approximate)   History   Shortness of Breath   HPI  Emily [name] is a 61 y.o. female who presents to the emergency department today because of concerns for shortness of breath.  Started about a week ago.  Has been fairly constant since then.  She has felt some wheezing in her lungs.  Does have history COPD and states that she has been using her inhalers with some relief at home.  Additionally she is on 4 L of oxygen at home but does not sound like she uses it when she leaves.  Patient denies any chest pain.  Denies any fevers.  No known sick contacts. Does state that she is being seen for her kidney failure and is arranged to have a fistula placed in her arm soon.     Physical Exam   Triage Vital Signs: ED Triage Vitals  Enc Vitals Group     BP 11/22/21 1610 (!) 188/79     Pulse Rate 11/22/21 1610 80     Resp 11/22/21 1610 (!) 22     Temp 11/22/21 1610 98.2 F (36.8 C)     Temp Source 11/22/21 1610 Oral     SpO2 11/22/21 1610 97 %     Weight 11/22/21 1614 223 lb (101.2 kg)     Height 11/22/21 1614 '5\' 4"'$  (1.626 m)     Head Circumference --      Peak Flow --      Pain Score 11/22/21 1614 0     Pain Loc --      Pain Edu? --      Excl. in [place]? --     Most recent vital signs: Vitals:   11/22/21 1610  BP: (!) 188/79  Pulse: 80  Resp: (!) 22  Temp: 98.2 F (36.8 C)  SpO2: 97%   General: Awake, alert, oriented. CV:  Good peripheral perfusion. Regular rate and rhythm. Resp:  Slightly increased work of breathing. Poor air movement diffusely. Abd:  No distention.    ED Results / Procedures / Treatments   Labs (all labs ordered are listed, but only abnormal results are displayed) Labs Reviewed  BASIC METABOLIC PANEL - Abnormal; Notable for the following components:      Result Value   Chloride 114 (*)    Glucose, Bld 189 (*)     BUN 46 (*)    Creatinine, Ser 5.05 (*)    Calcium 8.4 (*)    GFR, Estimated 9 (*)    All other components within normal limits  CBC - Abnormal; Notable for the following components:   RBC 3.26 (*)    Hemoglobin 8.7 (*)    HCT 29.0 (*)    All other components within normal limits  TROPONIN I (HIGH SENSITIVITY) - Abnormal; Notable for the following components:   Troponin I (High Sensitivity) 53 (*)    All other components within normal limits  TROPONIN I (HIGH SENSITIVITY)     EKG  I, Nance Pear, attending physician, personally viewed and interpreted this EKG  EKG Time: 1623 Rate: 74 Rhythm: sinus rhythm with PAC Axis: normal Intervals: qtc 477 QRS: narrow ST changes: no st elevation Impression: abnormal ekg    RADIOLOGY I independently interpreted and visualized the CXR. My interpretation: No pneumonia Radiology interpretation:  IMPRESSION:  No active cardiopulmonary disease.  PROCEDURES:  Critical Care performed: No  Procedures   MEDICATIONS ORDERED IN ED: Medications - No data to display   IMPRESSION / MDM / [place] / ED COURSE  I reviewed the triage vital signs and the nursing notes.                              Differential diagnosis includes, but is not limited to, COPD exacerbation, COVID, pneumonia, anemia, fluid overload.  Patient's presentation is most consistent with acute presentation with potential threat to life or bodily function.  Patient presented to the emergency department today because of concerns for shortness of breath.  Patient has history of COPD and is supposed to be on 4 L of oxygen at home.  On exam here she had poor air movement diffusely.  Was given breathing treatments.  She stated she felt somewhat improved however upon ambulation on her 4 L she still dropped into the high 80s.  Discussed with Dr. Daryll Drown with the hospitalist service who will plan on admission.   FINAL CLINICAL IMPRESSION(S) / ED  DIAGNOSES   Final diagnoses:  COPD exacerbation ([place])     Note:  This document was prepared using Dragon voice recognition software and may include unintentional dictation errors.    Nance Pear, MD 11/22/21 [phone]

## 2021-11-22 NOTE — ED Notes (Signed)
Pt assisted up to commode to void.  

## 2021-11-22 NOTE — ED Triage Notes (Addendum)
Patient c/o sob and fatigue that started last week and has progressively gotten worse. Reports worse with movement. Patient did not arrive with oxygen on and reports she wears 4L continuous  Patient has significant swelling in left leg. [place] completed 10/23 and reported no DVT

## 2021-11-22 NOTE — ED Notes (Signed)
Patients oxygen tank switched out

## 2021-11-22 NOTE — H&P (Addendum)
History and Physical    Emily [name] HWY:[id number] DOB: February 27, 1960 DOA: 11/22/2021  PCP: [facility]  Patient coming from: Home Chief Complaint: SOB and wheezing  HPI: Emily [name] is a 61 y.o. female with medical history significant of CKD V, COPD, chronic hypoxia on home O2, OSA on CPAP, DM2 on insulin, HLD, HTN, hyperthyroidism, lymphedema of the left leg, h/o stroke who presents for SOB X 1 week.  [name] reports she has been feeling more short of breath.  She has been taking her inhalers and using her oxygen, but does feel that she is using her albuterol more often of late.  She has noticed increased wheezing.  She does not cough much and has not had increased sputum production.  She has had no fevers, no sick contacts, no chest pain or other symptoms that she reports.  She is not smoking, son who lives with her smokes, but outside of the home.  She has chronic pain and skin changes in the left leg.  She is not sure what this is from, but has been present for > 1 year.  She is noted to have chronic lymphedema, following with vascular and has had compression stockings and compression devices in the past.  She was ambulated in the ED and dropped to < 88% on her oxygen.  She will be admitted for obs for COPD exacerbation.   ED Course: In the ED, she had viral panel which was neg for flu and COVID.  She had a CXR which did not show any fluid or signs of pneumonia.  Renal function is slightly worsened with Cr. Of 5, GFR 9 which is close to previous baseline around 11.  She is due to have a fistula/graft placed this week.  TnI are 53 and 55.   Review of Systems: As per HPI otherwise all other systems reviewed and are negative.  Past Medical History:  Diagnosis Date   Anemia    Arthritis    hands, all over   Asthma    CHF (congestive heart failure) (HCC)    Chronic respiratory failure with hypoxia (HCC)    Chronic venous insufficiency    COPD (chronic  obstructive pulmonary disease) (HCC)    on 2L home o2   Diabetes mellitus without complication (HCC)    type 2   Dyspnea    ESRD (end stage renal disease) (HCC)    History of methicillin resistant staphylococcus aureus (MRSA) 2015   Hyperlipidemia    Hypertension    Hyperthyroidism    Lymphedema    OSA on CPAP    Oxygen dependent    4 L [place]   Pneumonia 10/17/2021   Puerperal sepsis with acute hypoxic respiratory failure ([place])    Stroke [facility])    Was told at recent hospital visit she has stroke in past   Thyroid disease    Thyroid nodule 04/30/2016    Past Surgical History:  Procedure Laterality Date   arm surgery Left    COLONOSCOPY WITH PROPOFOL N/A 02/14/2019   Procedure: COLONOSCOPY WITH PROPOFOL;  Surgeon: [name], MD;  Location[facility] ENDOSCOPY;  Service: Endoscopy;  Laterality: N/A;    Social History  reports that she quit smoking about 5 years ago. Her smoking use included cigarettes. She has a 60.00 pack-year smoking history. She has never used smokeless tobacco. She reports that she does not drink alcohol and does not use drugs.  No Known Allergies  Family History  Problem Relation Age of  Onset   Diabetes Mother    Hypertension Mother    Diabetes Father    Hypertension Father    CAD Father    Diabetes Sister     Prior to Admission medications   Medication Sig Start Date End Date Taking? Authorizing Provider  ACCU-CHEK GUIDE test strip USE TO CHECK BLOOD SUGAR THREE TIMES DAILY 01/01/21  Yes Philemon Kingdom, MD  Accu-Chek Softclix Lancets lancets USE TO CHECK BLOOD SUGAR THREE TIMES DAILY AS INSTRUCTED 03/05/21  Yes Philemon Kingdom, MD  albuterol (PROVENTIL HFA;VENTOLIN HFA) 108 (90 Base) MCG/ACT inhaler Inhale 1-2 puffs into the lungs every 6 (six) hours as needed for wheezing or shortness of breath. 07/16/16  Yes Doles-Johnson, Teah, NP  Armodafinil (NUVIGIL) 50 MG tablet Take 1 tablet by mouth every morning. 07/16/21  Yes [provider]  aspirin  EC 81 MG tablet Take 81 mg by mouth daily.   Yes [provider]  atorvastatin (LIPITOR) 40 MG tablet TAKE ONE TABLET BY MOUTH EVERY DAY Patient taking differently: Take 40 mg by mouth at bedtime. 04/21/16  Yes Tawni Millers, MD  Blood Glucose Monitoring Suppl (ACCU-CHEK GUIDE) w/Device KIT Use as instructed to check blood sugar 3 times a day. E11.59 09/05/19  Yes Philemon Kingdom, MD  budesonide-formoterol [facility]) 160-4.5 MCG/ACT inhaler Inhale 2 puffs into the lungs 2 (two) times daily. 02/25/21  Yes [provider]  calcitRIOL (ROCALTROL) 0.25 MCG capsule Take 1 capsule (0.25 mcg total) by mouth 3 (three) times a week. 10/20/21  Yes Loletha Grayer, MD  carvedilol (COREG) 25 MG tablet Take 1 tablet by mouth twice daily Patient taking differently: Take 25 mg by mouth 2 (two) times daily with a meal. 04/15/21  Yes Hackney, Tina A, FNP  esomeprazole (NEXIUM) 40 MG capsule Take 40 mg by mouth every morning.   Yes [provider]  fluticasone (FLONASE) 50 MCG/ACT nasal spray Place 1 spray into both nostrils daily.   Yes [provider]  insulin glargine (LANTUS SOLOSTAR) 100 UNIT/ML Solostar Pen Inject 5 Units into the skin at bedtime. INJECT 30 UNITS SUBCUTANEOUSLY IN THE MORNING AND 30 UNITS AT NIGHT Patient taking differently: Inject 38 Units into the skin at bedtime. 10/19/21  Yes Wieting, Richard, MD  ipratropium (ATROVENT) 0.06 % nasal spray Place 1 spray into both nostrils 4 (four) times daily.   Yes [provider]  NOVOLOG FLEXPEN 100 UNIT/ML FlexPen Check sugars first if above 170 can give 3 units with meals Patient taking differently: 3 Units 3 (three) times daily with meals. Check sugars first if above 170 can give 3 units with meals 10/19/21  Yes Wieting, Richard, MD  OXYGEN Inhale 4 L into the lungs continuous.   Yes [provider]  tiotropium (SPIRIVA) 18 MCG inhalation capsule Place 18 mcg into inhaler and inhale daily.   Yes  [provider]  torsemide (DEMADEX) 20 MG tablet Take 20 mg by mouth every morning. 10/16/21  Yes [provider]  cetirizine (ZYRTEC) 10 MG tablet Take 10 mg by mouth daily. Patient not taking: Reported on 11/17/2021 08/19/21   [provider]  Vitamin D, Ergocalciferol, (DRISDOL) 1.25 MG (50000 UNIT) CAPS capsule Take 50,000 Units by mouth every 7 (seven) days. Patient not taking: Reported on 11/17/2021    [provider]    Physical Exam: Vitals:   11/22/21 1935 11/22/21 2100 11/22/21 2145 11/22/21 2245  BP: (!) 185/89 (!) 160/84  (!) 186/83  Pulse: 76 70  73  Resp: Marland Kitchen)  _0 Temp:   98.3 F (36.8 C)   TempSrc:   Oral   SpO2: 99% 98%  96%  Weight:      Height:        Constitutional: NAD, calm, comfortable, obese, resting in bed Eyes: lids and conjunctivae normal ENMT: Mucous membranes are moist.  Wearing oxygen, sat 98% Neck: normal, supple Respiratory: Decreased breath sounds throughout, no apparent wheezing on my exam, breathing comfortably on home dose of oxygen.  Cardiovascular: RR, NR, no murmur.  She has some mild 1+ edema to mid calves bilaterally, she has chronic hardening and pain of skin on the medial left leg which she notes has been there for a while.  Abdomen: obese, NT, ND, +BS Musculoskeletal: Normal tone for age.  Skin: no rashes, lesions, ulcers on exposed skin.  She does have chronic skin changes to the medial/posterior leg on the left.  She has tenderness to palpation and dry skin, no open wounds. Toes not assessed.  Neurologic: Grossly intact, moving all extremities in bed.  Psychiatric: Normal judgment and insight. Alert and oriented x 3. Normal mood.    Labs on Admission: I have personally reviewed following labs and imaging studies  CBC: Recent Labs  Lab 11/18/21 0919 11/22/21 1627  WBC 8.3 8.1  HGB 9.4* 8.7*  HCT 31.2* 29.0*  MCV 88.6 89.0  PLT 314 101    Basic Metabolic Panel: Recent Labs  Lab  11/18/21 0919 11/22/21 1627  NA 144 144  K 3.1* 3.5  CL 111 114*  CO2 25 23  GLUCOSE 92 189*  BUN 30* 46*  CREATININE 4.02* 5.05*  CALCIUM 8.4* 8.4*    GFR: Estimated Creatinine Clearance: 13.4 mL/min (A) (by C-G formula based on SCr of 5.05 mg/dL (H)).  Liver Function Tests: No results for input(s): "AST", "ALT", "ALKPHOS", "BILITOT", "PROT", "ALBUMIN" in the last 168 hours.    Radiological Exams on Admission: DG Chest 2 View  Result Date: 11/22/2021 CLINICAL DATA:  Shortness of breath EXAM: CHEST - 2 VIEW COMPARISON:  October 17, 2021 FINDINGS: The heart size and mediastinal contours are within normal limits. Both lungs are clear. The visualized skeletal structures are unremarkable. IMPRESSION: No active cardiopulmonary disease. Electronically Signed   By: Dorise Bullion III M.D.   On: 11/22/2021 16:46    EKG: Independently reviewed. SR with PACs  Assessment/Plan  COPD exacerbation (HCC) Chronic respiratory failure with hypoxia - Given chronicity of disease, azithromycin started - Solumedrol given in the ED, 4 days further of prednisone ordered - Oxygen to keep sats 88-92% - Duonebs PRN - Continue LABA/LAMA/ICS - CPAP QHS (h/o OSA)  Poorly controlled type 2 diabetes mellitus with circulatory disorder - Continue home lantus 38 units at night - SSI - Carb mod diet    Essential hypertension - Continue home Coreg, torsemide    Hyperthyroidism - Check TSH, she does not appear to be on therapy at this time.     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 Observation status is judged to be reasonable and necessary in order to provide the required intensity of service to ensure the patient's safety. The patient's presenting symptoms, physical exam findings, and initial radiographic and laboratory data in the context of their medical condition is felt to place them at decreased risk for further clinical deterioration. Furthermore, it is anticipated that the patient will be medically stable for discharge from the hospital within 2 midnights of admission.     Gilles Chiquito MD [facility]  How to contact the [facility] Attending or Consulting provider [place] or covering provider during after hours [place], for this patient?   Check the care team in [facility] and look for a) attending/consulting TR[facility] provider listed and b) the [facility] team listed Log into [email or web address] and use [place]'s universal password to access. If you do not have the password, please contact the hospital operator. Locate the [facility] provider you are looking for under [facility] and page to a number that you can be directly reached. If you still have difficulty reaching the provider, please page the [facility] (Director on Call) for the Hospitalists listed on amion for assistance.  11/22/2021, 11:22 PM

## 2021-11-23 DIAGNOSIS — J441 Chronic obstructive pulmonary disease with (acute) exacerbation: Secondary | ICD-10-CM | POA: Diagnosis not present

## 2021-11-23 DIAGNOSIS — I10 Essential (primary) hypertension: Secondary | ICD-10-CM

## 2021-11-23 DIAGNOSIS — J9611 Chronic respiratory failure with hypoxia: Secondary | ICD-10-CM

## 2021-11-23 DIAGNOSIS — N185 Chronic kidney disease, stage 5: Secondary | ICD-10-CM

## 2021-11-23 DIAGNOSIS — E1122 Type 2 diabetes mellitus with diabetic chronic kidney disease: Secondary | ICD-10-CM

## 2021-11-23 DIAGNOSIS — D638 Anemia in other chronic diseases classified elsewhere: Secondary | ICD-10-CM

## 2021-11-23 DIAGNOSIS — E1165 Type 2 diabetes mellitus with hyperglycemia: Secondary | ICD-10-CM

## 2021-11-23 DIAGNOSIS — E1159 Type 2 diabetes mellitus with other circulatory complications: Secondary | ICD-10-CM

## 2021-11-23 LAB — BASIC METABOLIC PANEL
Anion gap: 7 (ref 5–15)
BUN: 45 mg/dL — ABNORMAL HIGH (ref 8–23)
CO2: 21 mmol/L — ABNORMAL LOW (ref 22–32)
Calcium: 8.6 mg/dL — ABNORMAL LOW (ref 8.9–10.3)
Chloride: 116 mmol/L — ABNORMAL HIGH (ref 98–111)
Creatinine, Ser: 4.65 mg/dL — ABNORMAL HIGH (ref 0.44–1.00)
GFR, Estimated: 10 mL/min — ABNORMAL LOW (ref >60)
Glucose, Bld: 245 mg/dL — ABNORMAL HIGH (ref 70–99)
Potassium: 3.6 mmol/L (ref 3.5–5.1)
Sodium: 144 mmol/L (ref 135–145)

## 2021-11-23 LAB — CBC
HCT: 33.6 % — ABNORMAL LOW (ref 36.0–46.0)
HCT: 31.5 % — ABNORMAL LOW (ref 36.0–46.0)
Hemoglobin: 9.9 g/dL — ABNORMAL LOW (ref 12.0–15.0)
Hemoglobin: 9.4 g/dL — ABNORMAL LOW (ref 12.0–15.0)
MCH: 26.5 pg (ref 26.0–34.0)
MCH: 26.3 pg (ref 26.0–34.0)
MCHC: 29.5 g/dL — ABNORMAL LOW (ref 30.0–36.0)
MCHC: 29.8 g/dL — ABNORMAL LOW (ref 30.0–36.0)
MCV: 89.8 fL (ref 80.0–100.0)
MCV: 88.2 fL (ref 80.0–100.0)
Platelets: 256 10*3/uL (ref 150–400)
Platelets: 269 10*3/uL (ref 150–400)
RBC: 3.74 MIL/uL — ABNORMAL LOW (ref 3.87–5.11)
RBC: 3.57 MIL/uL — ABNORMAL LOW (ref 3.87–5.11)
RDW: 13.6 % (ref 11.5–15.5)
RDW: 13.6 % (ref 11.5–15.5)
WBC: 8.3 10*3/uL (ref 4.0–10.5)
WBC: 7.1 10*3/uL (ref 4.0–10.5)
nRBC: 0 % (ref 0.0–0.2)
nRBC: 0 % (ref 0.0–0.2)

## 2021-11-23 LAB — GLUCOSE, CAPILLARY
Glucose-Capillary: 240 mg/dL — ABNORMAL HIGH (ref 70–99)
Glucose-Capillary: 211 mg/dL — ABNORMAL HIGH (ref 70–99)
Glucose-Capillary: 234 mg/dL — ABNORMAL HIGH (ref 70–99)
Glucose-Capillary: 231 mg/dL — ABNORMAL HIGH (ref 70–99)
Glucose-Capillary: 262 mg/dL — ABNORMAL HIGH (ref 70–99)

## 2021-11-23 LAB — CREATININE, SERUM
Creatinine, Ser: 4.88 mg/dL — ABNORMAL HIGH (ref 0.44–1.00)
GFR, Estimated: 10 mL/min — ABNORMAL LOW (ref >60)

## 2021-11-23 LAB — TSH: TSH: 0.245 u[IU]/mL — ABNORMAL LOW (ref 0.350–4.500)

## 2021-11-23 MED ORDER — PREDNISONE 20 MG PO TABS
40.0000 mg | ORAL_TABLET | Freq: Every day | ORAL | Status: DC
  Administered 2021-11-23 – 2021-11-24 (×2): 40 mg via ORAL
  Filled 2021-11-23 (×2): qty 2

## 2021-11-23 MED ORDER — MODAFINIL 100 MG PO TABS
50.0000 mg | ORAL_TABLET | Freq: Every day | ORAL | Status: DC
  Administered 2021-11-24: 50 mg via ORAL
  Filled 2021-11-23 (×2): qty 1

## 2021-11-23 MED ORDER — IPRATROPIUM-ALBUTEROL 0.5-2.5 (3) MG/3ML IN SOLN: 3.0000 mL | RESPIRATORY_TRACT | Status: DC | PRN

## 2021-11-23 MED ORDER — ALBUTEROL SULFATE HFA 108 (90 BASE) MCG/ACT IN AERS: 1.0000 | INHALATION_SPRAY | Freq: Four times a day (QID) | RESPIRATORY_TRACT | Status: DC | PRN

## 2021-11-23 MED ORDER — CALCITRIOL 0.25 MCG PO CAPS
0.2500 ug | ORAL_CAPSULE | ORAL | Status: DC
  Administered 2021-11-24: 0.25 ug via ORAL
  Filled 2021-11-23: qty 1

## 2021-11-23 MED ORDER — INSULIN ASPART 100 UNIT/ML IJ SOLN
3.0000 [IU] | Freq: Three times a day (TID) | INTRAMUSCULAR | Status: DC
  Administered 2021-11-23 – 2021-11-24 (×3): 3 [IU] via SUBCUTANEOUS
  Filled 2021-11-23 (×3): qty 1

## 2021-11-23 MED ORDER — HEPARIN SODIUM (PORCINE) 5000 UNIT/ML IJ SOLN: 5000.0000 [IU] | Freq: Three times a day (TID) | INTRAMUSCULAR | Status: DC

## 2021-11-23 MED ORDER — ASPIRIN 81 MG PO TBEC
81.0000 mg | DELAYED_RELEASE_TABLET | Freq: Every day | ORAL | Status: DC
  Administered 2021-11-23 – 2021-11-24 (×2): 81 mg via ORAL
  Filled 2021-11-23 (×2): qty 1

## 2021-11-23 MED ORDER — INSULIN GLARGINE-YFGN 100 UNIT/ML ~~LOC~~ SOLN
38.0000 [IU] | Freq: Every day | SUBCUTANEOUS | Status: DC
  Administered 2021-11-23 (×2): 38 [IU] via SUBCUTANEOUS
  Filled 2021-11-23 (×3): qty 0.38

## 2021-11-23 MED ORDER — IPRATROPIUM BROMIDE 0.06 % NA SOLN
1.0000 | Freq: Four times a day (QID) | NASAL | Status: DC
  Administered 2021-11-23 – 2021-11-24 (×5): 1 via NASAL
  Filled 2021-11-23: qty 15

## 2021-11-23 MED ORDER — HEPARIN SODIUM (PORCINE) 5000 UNIT/ML IJ SOLN
5000.0000 [IU] | Freq: Three times a day (TID) | INTRAMUSCULAR | Status: DC
  Administered 2021-11-23 – 2021-11-24 (×4): 5000 [IU] via SUBCUTANEOUS
  Filled 2021-11-23 (×4): qty 1

## 2021-11-23 MED ORDER — ATORVASTATIN CALCIUM 20 MG PO TABS
40.0000 mg | ORAL_TABLET | Freq: Every day | ORAL | Status: DC
  Administered 2021-11-23 (×2): 40 mg via ORAL
  Filled 2021-11-23 (×2): qty 2

## 2021-11-23 MED ORDER — MOMETASONE FURO-FORMOTEROL FUM 200-5 MCG/ACT IN AERO
2.0000 | INHALATION_SPRAY | Freq: Two times a day (BID) | RESPIRATORY_TRACT | Status: DC
  Administered 2021-11-23 – 2021-11-24 (×3): 2 via RESPIRATORY_TRACT
  Filled 2021-11-23: qty 8.8

## 2021-11-23 MED ORDER — ACETAMINOPHEN 325 MG PO TABS
650.0000 mg | ORAL_TABLET | Freq: Four times a day (QID) | ORAL | Status: DC | PRN
  Administered 2021-11-23 (×2): 650 mg via ORAL
  Filled 2021-11-23 (×2): qty 2

## 2021-11-23 MED ORDER — CARVEDILOL 25 MG PO TABS
25.0000 mg | ORAL_TABLET | Freq: Two times a day (BID) | ORAL | Status: DC
  Administered 2021-11-23 – 2021-11-24 (×3): 25 mg via ORAL
  Filled 2021-11-23 (×3): qty 1

## 2021-11-23 MED ORDER — TORSEMIDE 20 MG PO TABS
20.0000 mg | ORAL_TABLET | Freq: Every day | ORAL | Status: DC
  Administered 2021-11-23 – 2021-11-24 (×2): 20 mg via ORAL
  Filled 2021-11-23 (×2): qty 1

## 2021-11-23 MED ORDER — ONDANSETRON HCL 4 MG/2ML IJ SOLN: 4.0000 mg | Freq: Four times a day (QID) | INTRAMUSCULAR | Status: DC | PRN

## 2021-11-23 MED ORDER — ALBUTEROL SULFATE (2.5 MG/3ML) 0.083% IN NEBU: 2.5000 mg | INHALATION_SOLUTION | Freq: Four times a day (QID) | RESPIRATORY_TRACT | Status: DC | PRN

## 2021-11-23 MED ORDER — HYDRALAZINE HCL 20 MG/ML IJ SOLN
10.0000 mg | Freq: Four times a day (QID) | INTRAMUSCULAR | Status: DC | PRN
  Administered 2021-11-23: 10 mg via INTRAVENOUS
  Filled 2021-11-23 (×2): qty 1

## 2021-11-23 MED ORDER — ACETAMINOPHEN 650 MG RE SUPP: 650.0000 mg | Freq: Four times a day (QID) | RECTAL | Status: DC | PRN

## 2021-11-23 MED ORDER — INSULIN ASPART 100 UNIT/ML IJ SOLN
0.0000 [IU] | Freq: Every day | INTRAMUSCULAR | Status: DC
  Administered 2021-11-23: 3 [IU] via SUBCUTANEOUS
  Administered 2021-11-23: 2 [IU] via SUBCUTANEOUS
  Filled 2021-11-23 (×2): qty 1

## 2021-11-23 MED ORDER — INSULIN ASPART 100 UNIT/ML IJ SOLN
0.0000 [IU] | Freq: Three times a day (TID) | INTRAMUSCULAR | Status: DC
  Administered 2021-11-23 (×3): 7 [IU] via SUBCUTANEOUS
  Administered 2021-11-24 (×2): 11 [IU] via SUBCUTANEOUS
  Filled 2021-11-23 (×5): qty 1

## 2021-11-23 MED ORDER — SODIUM CHLORIDE 0.9% FLUSH
3.0000 mL | Freq: Two times a day (BID) | INTRAVENOUS | Status: DC
  Administered 2021-11-23 – 2021-11-24 (×4): 3 mL via INTRAVENOUS

## 2021-11-23 MED ORDER — OXYCODONE HCL 5 MG PO TABS: 5.0000 mg | ORAL_TABLET | ORAL | Status: DC | PRN

## 2021-11-23 MED ORDER — AZITHROMYCIN 500 MG PO TABS
250.0000 mg | ORAL_TABLET | Freq: Every day | ORAL | Status: DC
  Administered 2021-11-23: 250 mg via ORAL
  Filled 2021-11-23: qty 1

## 2021-11-23 MED ORDER — HYDRALAZINE HCL 50 MG PO TABS
25.0000 mg | ORAL_TABLET | Freq: Three times a day (TID) | ORAL | Status: DC
  Administered 2021-11-23 – 2021-11-24 (×3): 25 mg via ORAL
  Filled 2021-11-23 (×3): qty 1

## 2021-11-23 MED ORDER — FLUTICASONE PROPIONATE 50 MCG/ACT NA SUSP
1.0000 | Freq: Every day | NASAL | Status: DC
  Administered 2021-11-23 – 2021-11-24 (×2): 1 via NASAL
  Filled 2021-11-23: qty 16

## 2021-11-23 MED ORDER — METHYLPREDNISOLONE SODIUM SUCC 40 MG IJ SOLR
40.0000 mg | Freq: Once | INTRAMUSCULAR | Status: AC
  Administered 2021-11-23: 40 mg via INTRAVENOUS
  Filled 2021-11-23: qty 1

## 2021-11-23 MED ORDER — ONDANSETRON HCL 4 MG PO TABS: 4.0000 mg | ORAL_TABLET | Freq: Four times a day (QID) | ORAL | Status: DC | PRN

## 2021-11-23 MED ORDER — PANTOPRAZOLE SODIUM 40 MG PO TBEC
40.0000 mg | DELAYED_RELEASE_TABLET | Freq: Every day | ORAL | Status: DC
  Administered 2021-11-23 – 2021-11-24 (×2): 40 mg via ORAL
  Filled 2021-11-23 (×2): qty 1

## 2021-11-23 NOTE — ED Notes (Signed)
Sandwich box given per admitting provider.

## 2021-11-23 NOTE — Evaluation (Signed)
Physical Therapy Evaluation Patient Details Name: Emily [name] MRN: [id number] DOB: 05-Aug-1960 Today's Date: 11/23/2021  History of Present Illness  presented to ER secondary to progressive SOB, wheezing x1 week; admitted for management of chronic respiratory failure with hypoxia secondary to COPD exacerbation.  Clinical Impression  Patient resting in bed upon arrival to room; alert and oriented, follows commands and agreeable to mobility efforts this AM.  Denies pain; does endorse noted improvement in respiratory status and overall activity tolerance since admission. Bilat UE/LE strength and ROM grossly symmetrical and WFL; no focal weakness appreciated (baseline lymphadema to L LE).  Able to complete bed mobility indep; sit/stand, basic transfers and gait (100') without assist device, mod indep. Demonstrates reciprocal stepping pattern with fair step height/length; fair/good cadence and overall gait speed. Completes head turns, changes of direction without difficulty. Minimal SOB noted; sats 89% after distance (4L), recovers >90% within 15-20 seconds of seated rest and pursed lip breathing  Appears to be at baseline level of functional ability; no acute PT needs identified.  Will complete PT order at this time; please re-consult should needs change.  Will enroll with mobility specialist team to ensure progressive and consistent mobilization throughout remaining hospital stay.       Recommendations for follow up therapy are one component of a multi-disciplinary discharge planning process, led by the attending physician.  Recommendations may be updated based on patient status, additional functional criteria and insurance authorization.  Follow Up Recommendations No PT follow up      Assistance Recommended at Discharge PRN  Patient can return home with the following       Equipment Recommendations    Recommendations for Other Services       Functional Status Assessment Patient has not  had a recent decline in their functional status     Precautions / Restrictions Precautions Precautions: None Restrictions Weight Bearing Restrictions: No      Mobility  Bed Mobility Overal bed mobility: Modified Independent                  Transfers Overall transfer level: Modified independent Equipment used: None                    Ambulation/Gait Ambulation/Gait assistance: Modified independent (Device/Increase time) Gait Distance (Feet): 100 Feet Assistive device: Rolling walker (2 wheels)         General Gait Details: reciprocal stepping pattern with fair step height/length; fair/good cadence and overall gait speed.  Completes head turns, changes of direction without difficulty.  Minimal SOB noted; sats 89% after distance (4L), recovers >90% within 15-20 seconds of seated rest and pursed lip breathing  Stairs            Wheelchair Mobility    Modified Rankin (Stroke Patients Only)       Balance Overall balance assessment: Needs assistance Sitting-balance support: No upper extremity supported, Feet supported Sitting balance-Leahy Scale: Good     Standing balance support: No upper extremity supported Standing balance-Leahy Scale: Good                               Pertinent Vitals/Pain Pain Assessment Pain Assessment: No/denies pain    Home Living Family/patient expects to be discharged to:: Private residence Living Arrangements: Parent Available Help at Discharge: Family Type of Home: House Home Access: Stairs to enter Entrance Stairs-Rails: None Entrance Stairs-Number of Steps: 3   Home Layout: One level Home  Equipment: Conservation officer, nature (2 wheels);Cane - single point;BSC/3in1      Prior Function Prior Level of Function : Independent/Modified Independent             Mobility Comments: Mod indep with ADLs, household and limited community mobilization without assist device; home O2 at 4L; denies recent fall  history.       Hand Dominance   Dominant Hand: Right    Extremity/Trunk Assessment   Upper Extremity Assessment Upper Extremity Assessment: Overall WFL for tasks assessed    Lower Extremity Assessment Lower Extremity Assessment: Overall WFL for tasks assessed (grossly 4+/5 throughout)       Communication   Communication: No difficulties  Cognition Arousal/Alertness: Awake/alert Behavior During Therapy: WFL for tasks assessed/performed Overall Cognitive Status: Within Functional Limits for tasks assessed                                          General Comments      Exercises Other Exercises Other Exercises: Reviewed role of activity pacing and energy conservation to ensure adequate oxygenation/respiratory support with functional activities; patient voiced understanding of information.   Assessment/Plan    PT Assessment Patient does not need any further PT services  PT Problem List Decreased activity tolerance;Decreased balance;Cardiopulmonary status limiting activity;Decreased mobility       PT Treatment Interventions DME instruction;Gait training;Functional mobility training;Therapeutic activities;Balance training;Therapeutic exercise;Patient/family education    PT Goals (Current goals can be found in the Care Plan section)  Acute Rehab PT Goals Patient Stated Goal: to return home PT Goal Formulation: Patient unable to participate in goal setting Time For Goal Achievement: 11/24/21 Potential to Achieve Goals: Good    Frequency       Co-evaluation               AM-PAC PT "6 Clicks" Mobility  Outcome Measure Help needed turning from your back to your side while in a flat bed without using bedrails?: None Help needed moving from lying on your back to sitting on the side of a flat bed without using bedrails?: None Help needed moving to and from a bed to a chair (including a wheelchair)?: None Help needed standing up from a chair using  your arms (e.g., wheelchair or bedside chair)?: None Help needed to walk in hospital room?: None Help needed climbing 3-5 steps with a railing? : None 6 Click Score: 24    End of Session Equipment Utilized During Treatment: Oxygen Activity Tolerance: Patient tolerated treatment well Patient left: in chair;with call bell/phone within reach   PT Visit Diagnosis: Difficulty in walking, not elsewhere classified (R26.2)    Time: 1030-1043 PT Time Calculation (min) (ACUTE ONLY): 13 min   Charges:   PT Evaluation $PT Eval Low Complexity: 1 Low         Okley Magnussen H. Owens Shark, PT, DPT, NCS 11/23/21, 10:57 AM [phone]

## 2021-11-23 NOTE — Plan of Care (Signed)
  Problem: Education: Goal: Knowledge of disease or condition will improve Outcome: Completed/Met   Problem: Activity: Goal: Ability to tolerate increased activity will improve Outcome: Completed/Met Goal: Will verbalize the importance of balancing activity with adequate rest periods Outcome: Completed/Met   Problem: Activity: Goal: Will verbalize the importance of balancing activity with adequate rest periods Outcome: Completed/Met   Problem: Respiratory: Goal: Ability to maintain a clear airway will improve Outcome: Progressing   Problem: Education: Goal: Knowledge of General Education information will improve Description: Including pain rating scale, medication(s)/side effects and non-pharmacologic comfort measures Outcome: Progressing   Problem: Clinical Measurements: Goal: Ability to maintain clinical measurements within normal limits will improve Outcome: Progressing Goal: Diagnostic test results will improve Outcome: Completed/Met Goal: Respiratory complications will improve Outcome: Progressing Goal: Cardiovascular complication will be avoided Outcome: Completed/Met   Problem: Clinical Measurements: Goal: Diagnostic test results will improve Outcome: Completed/Met   Problem: Clinical Measurements: Goal: Respiratory complications will improve Outcome: Progressing   Problem: Clinical Measurements: Goal: Cardiovascular complication will be avoided Outcome: Completed/Met   Problem: Activity: Goal: Risk for activity intolerance will decrease Outcome: Progressing   Problem: Nutrition: Goal: Adequate nutrition will be maintained Outcome: Completed/Met   Problem: Coping: Goal: Level of anxiety will decrease Outcome: Progressing   Problem: Elimination: Goal: Will not experience complications related to bowel motility Outcome: Completed/Met   Problem: Pain Managment: Goal: General experience of comfort will improve Outcome: Completed/Met   Problem:  Safety: Goal: Ability to remain free from injury will improve Outcome: Completed/Met   Problem: Skin Integrity: Goal: Risk for impaired skin integrity will decrease Outcome: Completed/Met   Problem: Education: Goal: Knowledge of General Education information will improve Description: Including pain rating scale, medication(s)/side effects and non-pharmacologic comfort measures Outcome: Completed/Met   Problem: Health Behavior/Discharge Planning: Goal: Ability to manage health-related needs will improve Outcome: Completed/Met   Problem: Clinical Measurements: Goal: Ability to maintain clinical measurements within normal limits will improve Outcome: Progressing Goal: Will remain free from infection Outcome: Completed/Met Goal: Diagnostic test results will improve Outcome: Progressing Goal: Respiratory complications will improve Outcome: Progressing   Problem: Clinical Measurements: Goal: Will remain free from infection Outcome: Completed/Met   Problem: Clinical Measurements: Goal: Diagnostic test results will improve Outcome: Progressing   Problem: Clinical Measurements: Goal: Respiratory complications will improve Outcome: Progressing   Problem: Activity: Goal: Risk for activity intolerance will decrease Outcome: Completed/Met   Problem: Nutrition: Goal: Adequate nutrition will be maintained Outcome: Completed/Met   Problem: Coping: Goal: Level of anxiety will decrease Outcome: Progressing   Problem: Elimination: Goal: Will not experience complications related to bowel motility Outcome: Completed/Met Goal: Will not experience complications related to urinary retention Outcome: Completed/Met   Problem: Elimination: Goal: Will not experience complications related to urinary retention Outcome: Completed/Met   Problem: Pain Managment: Goal: General experience of comfort will improve Outcome: Completed/Met   Problem: Pain Managment: Goal: General experience  of comfort will improve Outcome: Completed/Met   Problem: Safety: Goal: Ability to remain free from injury will improve Outcome: Completed/Met

## 2021-11-23 NOTE — Progress Notes (Signed)
[facility]                                                                               Emily [name], is a 61 y.o. female, DOB - 07/29/1960, IOX:[id number] Admit date - 11/22/2021    Outpatient Primary MD for the patient is [facility] [place]  LOS - 0  days    Brief summary    Emily [name] is a 61 y.o. female with medical history significant of CKD V, COPD, chronic hypoxia on home O2, OSA on CPAP, DM2 on insulin, HLD, HTN, hyperthyroidism, lymphedema of the left leg, h/o stroke who presents for SOB X 1 week.  Emily [name] reports she has been feeling more short of breath.   She was ambulated in the ED and dropped to < 88% on her oxygen.  She will be admitted for obs for COPD exacerbation.    Assessment & Plan    Assessment and Plan:  Acute COPD exacerbation. Acute on chronic respiratory failure with hypoxia on 4 lit of [place] oxygen.  Still wheezing, another dose of IV solumedrol given.  Transition to prednisone in am.  Continue with duonebs and LABA/ LAMA.  Continue with CPAP.    Poorly controlled Type 2 DM with hyperglycemia.  CBG (last 3)  Recent Labs    11/23/21 0107 11/23/21 0842  GLUCAP 240* 211*   Elevated cbgs from steroids.  Continue with lantus , SSI, and will add novolog 3 units TIDAC.     Hypothyroidism:  TSH pending.    Hypertension:  Sub optimal.  Add hydralazine 25 mg TID.  Continue with coreg and torsemide.   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     Anemia of chronic disease:  Continue to monitor.     Bilateral lower extremity edema.  Continue with torsemide.    Estimated body mass index is 38.28 kg/m as calculated from the following:   Height as of this encounter: '5\' 4"'$  (1.626 m).   Weight as of this encounter: 101.2 kg.  Code Status: full code.  DVT Prophylaxis:  heparin injection 5,000 Units Start: 11/23/21 0600   Level of Care: Level of care:  Med-Surg Family Communication: none at bedside.   Disposition Plan:     Remains inpatient appropriate:  IV steroids, wheezing  Procedures:  None.   Consultants:   None.   Antimicrobials:   Anti-infectives (From admission, onward)    Start     Dose/Rate Route Frequency Ordered Stop   11/23/21 1800  azithromycin (ZITHROMAX) tablet 250 mg        250 mg Oral Daily-1800 11/23/21 0003 11/27/21 1759   11/22/21 2230  azithromycin (ZITHROMAX) 500 mg in sodium chloride 0.9 % 250 mL IVPB        500 mg 250 mL/hr over 60 Minutes Intravenous  Once 11/22/21 2223 11/23/21 0032        Medications  Scheduled Meds:  aspirin EC  81 mg Oral Daily   atorvastatin  40 mg Oral QHS   azithromycin  250 mg Oral q1800   [START ON 11/24/2021] calcitRIOL  0.25 mcg Oral  Once per day on Mon Wed Fri   carvedilol  25 mg Oral BID WC   fluticasone  1 spray Each Nare Daily   heparin  5,000 Units Subcutaneous Q8H   insulin aspart  0-20 Units Subcutaneous TID WC   insulin aspart  0-5 Units Subcutaneous QHS   insulin glargine-yfgn  38 Units Subcutaneous QHS   ipratropium  1 spray Each Nare QID   modafinil  50 mg Oral QAC breakfast   mometasone-formoterol  2 puff Inhalation BID   pantoprazole  40 mg Oral Daily   predniSONE  40 mg Oral Q breakfast   sodium chloride flush  3 mL Intravenous Q12H   torsemide  20 mg Oral Q breakfast   Continuous Infusions: PRN Meds:.acetaminophen **OR** acetaminophen, albuterol, hydrALAZINE, ipratropium-albuterol, ondansetron **OR** ondansetron (ZOFRAN) IV, oxyCODONE    Subjective:   Emily [name] was seen and examined today.  Still wheezing a little.   Objective:   Vitals:   11/23/21 0526 11/23/21 0600 11/23/21 0840 11/23/21 1048  BP: (!) 192/106 (!) 171/93 (!) 187/85   Pulse: 88 86 80   Resp: '20 18 14   '$ Temp: 98.1 F (36.7 C) 97.9 F (36.6 C) 98.1 F (36.7 C)   TempSrc: Oral     SpO2: 97% 95% 99% (!) 89%  Weight:      Height:       No intake or output  data in the 24 hours ending 11/23/21 1203 Filed Weights   11/22/21 1614  Weight: 101.2 kg     Exam General exam: Appears calm and comfortable  Respiratory system: Clear to auscultation. Scattered wheezing posteriorly.  Cardiovascular system: S1 & S2 heard, RRR. No JVD,  Gastrointestinal system: Abdomen is nondistended, soft and nontender. Central nervous system: Alert and oriented. No focal neurological deficits. Extremities: Symmetric 5 x 5 power. Skin: No rashes, Psychiatry: Mood & affect appropriate.     Data Reviewed:  I have personally reviewed following labs and imaging studies   CBC Lab Results  Component Value Date   WBC 7.1 11/23/2021   RBC 3.57 (L) 11/23/2021   HGB 9.4 (L) 11/23/2021   HCT 31.5 (L) 11/23/2021   MCV 88.2 11/23/2021   MCH 26.3 11/23/2021   PLT 269 11/23/2021   MCHC 29.8 (L) 11/23/2021   RDW 13.6 11/23/2021   LYMPHSABS 1.2 10/17/2021   MONOABS 0.7 10/17/2021   EOSABS 0.2 10/17/2021   BASOSABS 0.0 86/57/8469     Last metabolic panel Lab Results  Component Value Date   NA 144 11/23/2021   K 3.6 11/23/2021   CL 116 (H) 11/23/2021   CO2 21 (L) 11/23/2021   BUN 45 (H) 11/23/2021   CREATININE 4.65 (H) 11/23/2021   GLUCOSE 245 (H) 11/23/2021   GFRNONAA 10 (L) 11/23/2021   GFRAA 22 (L) 04/25/2019   CALCIUM 8.6 (L) 11/23/2021   PHOS 4.8 (H) 06/18/2021   PROT 6.9 10/17/2021   ALBUMIN 2.9 (L) 10/17/2021   LABGLOB 2.9 05/14/2016   AGRATIO 1.2 05/14/2016   BILITOT 0.5 10/17/2021   ALKPHOS 102 10/17/2021   AST 11 (L) 10/17/2021   ALT 9 10/17/2021   ANIONGAP 7 11/23/2021    CBG (last 3)  Recent Labs    11/23/21 0107 11/23/21 0842  GLUCAP 240* 211*      Coagulation Profile: No results for input(s): "INR", "PROTIME" in the last 168 hours.   Radiology Studies: DG Chest 2 View  Result Date: 11/22/2021 CLINICAL DATA:  Shortness of breath EXAM: CHEST -  2 VIEW COMPARISON:  October 17, 2021 FINDINGS: The heart size and mediastinal  contours are within normal limits. Both lungs are clear. The visualized skeletal structures are unremarkable. IMPRESSION: No active cardiopulmonary disease. Electronically Signed   By: Dorise Bullion III M.D.   On: 11/22/2021 16:46       Hosie Poisson M.D. [facility] 11/23/2021, 12:03 PM  Available via Epic secure chat 7am-7pm After 7 pm, please refer to night coverage provider listed on amion.

## 2021-11-23 NOTE — Progress Notes (Signed)
Mobility Specialist - Progress Note   11/23/21 1321  Mobility  Activity Ambulated independently in hallway  Level of Assistance Independent  Assistive Device None  Distance Ambulated (ft) 200 ft  Activity Response Tolerated well  Mobility Referral Yes  $Mobility charge 1 Mobility   Pt sitting in recliner on 5L upon arrival. Pt STS and ambulates in hallway indep with no LOB or AD. Pt returns to recliner with needs in reach.   Gretchen Short  Mobility Specialist  11/23/21 1:22 PM

## 2021-11-23 NOTE — ED Notes (Signed)
Request made for transport to the floor ?

## 2021-11-24 DIAGNOSIS — J9611 Chronic respiratory failure with hypoxia: Secondary | ICD-10-CM | POA: Diagnosis not present

## 2021-11-24 DIAGNOSIS — I10 Essential (primary) hypertension: Secondary | ICD-10-CM | POA: Diagnosis not present

## 2021-11-24 DIAGNOSIS — J441 Chronic obstructive pulmonary disease with (acute) exacerbation: Secondary | ICD-10-CM | POA: Diagnosis not present

## 2021-11-24 DIAGNOSIS — E1122 Type 2 diabetes mellitus with diabetic chronic kidney disease: Secondary | ICD-10-CM | POA: Diagnosis not present

## 2021-11-24 LAB — GLUCOSE, CAPILLARY
Glucose-Capillary: 252 mg/dL — ABNORMAL HIGH (ref 70–99)
Glucose-Capillary: 255 mg/dL — ABNORMAL HIGH (ref 70–99)

## 2021-11-24 MED ORDER — AZITHROMYCIN 250 MG PO TABS: 250.0000 mg | ORAL_TABLET | Freq: Every day | ORAL | 0 refills | Status: AC

## 2021-11-24 MED ORDER — PREDNISONE 20 MG PO TABS: 40.0000 mg | ORAL_TABLET | Freq: Every day | ORAL | 0 refills | Status: AC

## 2021-11-24 MED ORDER — IPRATROPIUM-ALBUTEROL 0.5-2.5 (3) MG/3ML IN SOLN: 3.0000 mL | RESPIRATORY_TRACT | 5 refills | Status: AC | PRN

## 2021-11-24 MED ORDER — HYDRALAZINE HCL 25 MG PO TABS: 25.0000 mg | ORAL_TABLET | Freq: Three times a day (TID) | ORAL | 1 refills | Status: AC

## 2021-11-24 MED ORDER — TORSEMIDE 20 MG PO TABS: 20.0000 mg | ORAL_TABLET | ORAL | 2 refills | Status: AC

## 2021-11-24 NOTE — Progress Notes (Signed)
  Transition of Care [facility]) Screening Note   Patient Details  Name: Emily [name] Date of Birth: 1960/02/17   [facility]) CM/SW Contact:    Quin Hoop, LCSW Phone Number: 11/24/2021, 11:54 AM  CSW requested POC eval from DO for [employer] for portble oxygen tank to be delivered to pt's room prior to discharge.  Pt already has a portable tank at home.  Pt will work with [employer] to find out about getting a smaller oxygen tank after discharge.  Transition of Care Department [facility]) has reviewed patient and no TOC needs have been identified at this time. We will continue to monitor patient advancement through interdisciplinary progression rounds. If new patient transition needs arise, please place a TOC consult.

## 2021-11-24 NOTE — Progress Notes (Signed)
Mobility Specialist - Progress Note   Pre-mobility: HR (89), BP(176/88), SpO2(100) During mobility:SpO2 (90) Post-mobility: HR (91), BP (176/85), SPO2(94)     11/24/21 0900  Therapy Vitals  BP (!) 176/85  Mobility  Activity Ambulated independently in hallway;Stood at bedside  Level of Assistance Independent  Assistive Device None  Distance Ambulated (ft) 365 ft  Activity Response Tolerated well  Mobility Referral Yes  $Mobility charge 1 Mobility   Pt resting in bed on 4l upon entry. Pt STS and ambulates in hallway indep. Pt asymptomatic throughout mobility session despite high BP and HR. RN Notified. Pt had a mild wheeze on second lap. Pt returned to chair and left with needs in reach.   Loma Sender Mobility Specialist 11/24/21, 9:13 AM

## 2021-11-25 NOTE — Discharge Summary (Signed)
Physician Discharge Summary   Patient: Emily [name] MRN: [id number] DOB: 30-Jul-1960  Admit date:     11/22/2021  Discharge date: 11/24/2021  Discharge Physician: Hosie Poisson   PCP: [facility]   Recommendations at discharge:  Please follow up with PCP in one week.  Please check cbc and bmp in one week.   Discharge Diagnoses: Principal Problem:   COPD exacerbation ([place]) Active Problems:   COPD (chronic obstructive pulmonary disease) (HCC)   Sleep apnea   Essential hypertension   Hyperthyroidism   Poorly controlled type 2 diabetes mellitus with circulatory disorder (HCC)   Leg pain, bilateral   Hyperlipidemia   Lymphedema   Chronic respiratory failure with hypoxia ([place])   CKD stage 5 due to type 2 diabetes mellitus ([place])   Anemia of chronic disease    Hospital Course: Emily [name] is a 61 y.o. female with medical history significant of CKD V, COPD, chronic hypoxia on home O2, OSA on CPAP, DM2 on insulin, HLD, HTN, hyperthyroidism, lymphedema of the left leg, h/o stroke who presents for SOB X 1 week.  [name] reports she has been feeling more short of breath.   She was ambulated in the ED and dropped to < 88% on her oxygen.  She will be admitted for obs for COPD exacerbation.     Assessment and Plan:  Acute COPD exacerbation. Acute on chronic respiratory failure with hypoxia on 4 lit of  oxygen.  Still wheezing,  she was started on IV solumedrol and  Transitioned to prednisone in am on discharge.  Continue with duonebs and LABA/ LAMA.  Continue with CPAP.      Poorly controlled Type 2 DM with hyperglycemia.  Resume home meds      Hypothyroidism:       Hypertension:  Better controlled. Added hydralazine to her home regimen.    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        Anemia of chronic disease:  Continue to monitor.        Bilateral lower extremity edema.  Continue with  torsemide.      Estimated body mass index is 38.28 kg/m as calculated from the following:   Height as of this encounter: 5' 4" (1.626 m).   Weight as of this encounter: 101.2 kg.      Consultants: none.  Procedures performed: none.   Disposition: Home Diet recommendation:  Discharge Diet Orders (From admission, onward)     Start     Ordered   11/24/21 0000  Diet - low sodium heart healthy        11/24/21 0923           Carb modified diet DISCHARGE MEDICATION: Allergies as of 11/24/2021   No Known Allergies      Medication List     STOP taking these medications    ipratropium 0.06 % nasal spray Commonly known as: ATROVENT   Vitamin D (Ergocalciferol) 1.25 MG (50000 UNIT) Caps capsule Commonly known as: DRISDOL       TAKE these medications    Accu-Chek Guide test strip Generic drug: glucose blood USE TO CHECK BLOOD SUGAR THREE TIMES DAILY   Accu-Chek Guide w/Device Kit Use as instructed to check blood sugar 3 times a day. E11.59   Accu-Chek Softclix Lancets lancets USE TO CHECK BLOOD SUGAR THREE TIMES DAILY AS INSTRUCTED   albuterol 108 (90 Base) MCG/ACT inhaler Commonly known as: VENTOLIN  HFA Inhale 1-2 puffs into the lungs every 6 (six) hours as needed for wheezing or shortness of breath.   aspirin EC 81 MG tablet Take 81 mg by mouth daily.   atorvastatin 40 MG tablet Commonly known as: LIPITOR TAKE ONE TABLET BY MOUTH EVERY DAY What changed: when to take this   azithromycin 250 MG tablet Commonly known as: ZITHROMAX Take 1 tablet (250 mg total) by mouth daily at 6 PM for 3 days.   budesonide-formoterol 160-4.5 MCG/ACT inhaler Commonly known as: SYMBICORT Inhale 2 puffs into the lungs 2 (two) times daily.   calcitRIOL 0.25 MCG capsule Commonly known as: ROCALTROL Take 1 capsule (0.25 mcg total) by mouth 3 (three) times a week.   carvedilol 25 MG tablet Commonly known as: COREG Take 1 tablet by mouth twice daily What changed: when  to take this   cetirizine 10 MG tablet Commonly known as: ZYRTEC Take 10 mg by mouth daily.   esomeprazole 40 MG capsule Commonly known as: NEXIUM Take 40 mg by mouth every morning.   fluticasone 50 MCG/ACT nasal spray Commonly known as: FLONASE Place 1 spray into both nostrils daily.   hydrALAZINE 25 MG tablet Commonly known as: APRESOLINE Take 1 tablet (25 mg total) by mouth every 8 (eight) hours.   ipratropium-albuterol 0.5-2.5 (3) MG/3ML Soln Commonly known as: DUONEB Take 3 mLs by nebulization every 4 (four) hours as needed.   Lantus SoloStar 100 UNIT/ML Solostar Pen Generic drug: insulin glargine Inject 5 Units into the skin at bedtime. INJECT 30 UNITS SUBCUTANEOUSLY IN THE MORNING AND 30 UNITS AT NIGHT What changed:  how much to take additional instructions   NovoLOG FlexPen 100 UNIT/ML FlexPen Generic drug: insulin aspart Check sugars first if above 170 can give 3 units with meals What changed:  how much to take when to take this   Nuvigil 50 MG tablet Generic drug: Armodafinil Take 1 tablet by mouth every morning.   OXYGEN Inhale 4 L into the lungs continuous.   predniSONE 20 MG tablet Commonly known as: DELTASONE Take 2 tablets (40 mg total) by mouth daily with breakfast for 5 days.   tiotropium 18 MCG inhalation capsule Commonly known as: SPIRIVA Place 18 mcg into inhaler and inhale daily.   torsemide 20 MG tablet Commonly known as: DEMADEX Take 1 tablet (20 mg total) by mouth every morning.               Durable Medical Equipment  (From admission, onward)           Start     Ordered   11/24/21 0000  For home use only DME Nebulizer machine       Question Answer Comment  Patient needs a nebulizer to treat with the following condition COPD (chronic obstructive pulmonary disease) ([place])   Length of Need Lifetime      11/24/21 0923            Discharge Exam: Filed Weights   11/22/21 1614  Weight: 101.2 kg   General exam:  Appears calm and comfortable  Respiratory system: Clear to auscultation. Respiratory effort normal. Cardiovascular system: S1 & S2 heard, RRR. No JVD, murmurs, rubs, gallops or clicks. No pedal edema. Gastrointestinal system: Abdomen is nondistended, soft and nontender. No organomegaly or masses felt. Normal bowel sounds heard. Central nervous system: Alert and oriented. No focal neurological deficits. Extremities: Symmetric 5 x 5 power. Skin: No rashes, lesions or ulcers Psychiatry: Judgement and insight appear normal. Mood & affect  appropriate.    Condition at discharge: fair  The results of significant diagnostics from this hospitalization (including imaging, microbiology, ancillary and laboratory) are listed below for reference.   Imaging Studies: DG Chest 2 View  Result Date: 11/22/2021 CLINICAL DATA:  Shortness of breath EXAM: CHEST - 2 VIEW COMPARISON:  October 17, 2021 FINDINGS: The heart size and mediastinal contours are within normal limits. Both lungs are clear. The visualized skeletal structures are unremarkable. IMPRESSION: No active cardiopulmonary disease. Electronically Signed   By: Dorise Bullion III M.D.   On: 11/22/2021 16:46   US Venous Img Lower Bilateral (DVT)  Result Date: 10/27/2021 CLINICAL DATA:  61 year old female with elevated D-dimer and lower extremity edema. EXAM: BILATERAL LOWER EXTREMITY VENOUS DOPPLER ULTRASOUND TECHNIQUE: Gray-scale sonography with graded compression, as well as color Doppler and duplex ultrasound were performed to evaluate the lower extremity deep venous systems from the level of the common femoral vein and including the common femoral, femoral, profunda femoral, popliteal and calf veins including the posterior tibial, peroneal and gastrocnemius veins when visible. The superficial great saphenous vein was also interrogated. Spectral Doppler was utilized to evaluate flow at rest and with distal augmentation maneuvers in the common femoral,  femoral and popliteal veins. COMPARISON:  None Available. FINDINGS: RIGHT LOWER EXTREMITY Common Femoral Vein: No evidence of thrombus. Normal compressibility, respiratory phasicity and response to augmentation. Saphenofemoral Junction: No evidence of thrombus. Normal compressibility and flow on color Doppler imaging. Profunda Femoral Vein: No evidence of thrombus. Normal compressibility and flow on color Doppler imaging. Femoral Vein: No evidence of thrombus. Normal compressibility, respiratory phasicity and response to augmentation. Popliteal Vein: No evidence of thrombus. Normal compressibility, respiratory phasicity and response to augmentation. Calf Veins: No evidence of thrombus. Normal compressibility and flow on color Doppler imaging. Superficial Great Saphenous Vein: No evidence of thrombus. Normal compressibility. Venous Reflux:  None. Other Findings:  None. LEFT LOWER EXTREMITY Common Femoral Vein: No evidence of thrombus. Normal compressibility, respiratory phasicity and response to augmentation. Saphenofemoral Junction: No evidence of thrombus. Normal compressibility and flow on color Doppler imaging. Profunda Femoral Vein: No evidence of thrombus. Normal compressibility and flow on color Doppler imaging. Femoral Vein: No evidence of thrombus. Normal compressibility, respiratory phasicity and response to augmentation. Popliteal Vein: No evidence of thrombus. Normal compressibility, respiratory phasicity and response to augmentation. Calf Veins: No evidence of thrombus. Normal compressibility and flow on color Doppler imaging. Superficial Great Saphenous Vein: No evidence of thrombus. Normal compressibility. Venous Reflux:  None. Other Findings:  None. IMPRESSION: No evidence of deep venous thrombosis in either lower extremity. Electronically Signed   By: Jacqulynn Cadet M.D.   On: 10/27/2021 15:57    Microbiology: Results for orders placed or performed during the hospital encounter of 11/22/21   Resp Panel by RT-PCR (Flu A&B, Covid) Anterior Nasal Swab     Status: None   Collection Time: 11/22/21  7:33 PM   Specimen: Anterior Nasal Swab  Result Value Ref Range Status   SARS Coronavirus 2 by RT PCR NEGATIVE NEGATIVE Final    Comment: (NOTE) SARS-CoV-2 target nucleic acids are NOT DETECTED.  The SARS-CoV-2 RNA is generally detectable in upper respiratory specimens during the acute phase of infection. The lowest concentration of SARS-CoV-2 viral copies this assay can detect is 138 copies/mL. A negative result does not preclude SARS-Cov-2 infection and should not be used as the sole basis for treatment or other patient management decisions. A negative result may occur with  improper specimen collection/handling, submission  of specimen other than nasopharyngeal swab, presence of viral mutation(s) within the areas targeted by this assay, and inadequate number of viral copies(<138 copies/mL). A negative result must be combined with clinical observations, patient history, and epidemiological information. The expected result is Negative.  Fact Sheet for Patients:  EntrepreneurPulse.com.au  Fact Sheet for Healthcare Providers:  IncredibleEmployment.be  This test is no t yet approved or cleared by the [place] FDA and  has been authorized for detection and/or diagnosis of SARS-CoV-2 by FDA under an Emergency Use Authorization (EUA). This EUA will remain  in effect (meaning this test can be used) for the duration of the COVID-19 declaration under Section 564(b)(1) of the Act, 21 U.S.C.section 360bbb-3(b)(1), unless the authorization is terminated  or revoked sooner.       Influenza A by PCR NEGATIVE NEGATIVE Final   Influenza B by PCR NEGATIVE NEGATIVE Final    Comment: (NOTE) The Xpert Xpress SARS-CoV-2/FLU/RSV plus assay is intended as an aid in the diagnosis of influenza from Nasopharyngeal swab specimens and should not be used as a  sole basis for treatment. Nasal washings and aspirates are unacceptable for Xpert Xpress SARS-CoV-2/FLU/RSV testing.  Fact Sheet for Patients: EntrepreneurPulse.com.au  Fact Sheet for Healthcare Providers: IncredibleEmployment.be  This test is not yet approved or cleared by the [place] FDA and has been authorized for detection and/or diagnosis of SARS-CoV-2 by FDA under an Emergency Use Authorization (EUA). This EUA will remain in effect (meaning this test can be used) for the duration of the COVID-19 declaration under Section 564(b)(1) of the Act, 21 U.S.C. section 360bbb-3(b)(1), unless the authorization is terminated or revoked.  Performed at [facility], [address] [place], [place] [address]     Labs: CBC: Recent Labs  Lab 11/22/21 1627 11/23/21 0117 11/23/21 0530  WBC 8.1 8.3 7.1  HGB 8.7* 9.9* 9.4*  HCT 29.0* 33.6* 31.5*  MCV 89.0 89.8 88.2  PLT 256 256 397   Basic Metabolic Panel: Recent Labs  Lab 11/22/21 1627 11/23/21 0117 11/23/21 0530  NA 144  --  144  K 3.5  --  3.6  CL 114*  --  116*  CO2 23  --  21*  GLUCOSE 189*  --  245*  BUN 46*  --  45*  CREATININE 5.05* 4.88* 4.65*  CALCIUM 8.4*  --  8.6*   Liver Function Tests: No results for input(s): "AST", "ALT", "ALKPHOS", "BILITOT", "PROT", "ALBUMIN" in the last 168 hours. CBG: Recent Labs  Lab 11/23/21 1224 11/23/21 1800 11/23/21 2025 11/24/21 0751 11/24/21 1145  GLUCAP 234* 231* 262* 252* 255*    Discharge time spent: 36 minutes.   Signed: Hosie Poisson, MD [facility] 11/25/2021

## 2021-11-26 ENCOUNTER — Encounter
Admission: RE | Disposition: A | Discharge status: Home / Self Care | Payer: Self-pay | Source: Home / Self Care | Attending: Vascular Surgery

## 2021-11-26 ENCOUNTER — Ambulatory Visit: Payer: Medicaid Other | Admitting: Urgent Care

## 2021-11-26 ENCOUNTER — Other Ambulatory Visit: Payer: Self-pay

## 2021-11-26 ENCOUNTER — Encounter: Payer: Self-pay | Admitting: Vascular Surgery

## 2021-11-26 ENCOUNTER — Ambulatory Visit
Admission: RE | Admit: 2021-11-26 | Discharge: 2021-11-26 | Disposition: A | Discharge status: Home / Self Care | Payer: Medicaid Other | Attending: Vascular Surgery | Admitting: Vascular Surgery

## 2021-11-26 DIAGNOSIS — J449 Chronic obstructive pulmonary disease, unspecified: Secondary | ICD-10-CM | POA: Diagnosis not present

## 2021-11-26 DIAGNOSIS — Z794 Long term (current) use of insulin: Secondary | ICD-10-CM | POA: Insufficient documentation

## 2021-11-26 DIAGNOSIS — E876 Hypokalemia: Secondary | ICD-10-CM

## 2021-11-26 DIAGNOSIS — Z79899 Other long term (current) drug therapy: Secondary | ICD-10-CM | POA: Insufficient documentation

## 2021-11-26 DIAGNOSIS — I110 Hypertensive heart disease with heart failure: Secondary | ICD-10-CM | POA: Diagnosis not present

## 2021-11-26 DIAGNOSIS — N184 Chronic kidney disease, stage 4 (severe): Secondary | ICD-10-CM

## 2021-11-26 DIAGNOSIS — I890 Lymphedema, not elsewhere classified: Secondary | ICD-10-CM | POA: Diagnosis not present

## 2021-11-26 DIAGNOSIS — E039 Hypothyroidism, unspecified: Secondary | ICD-10-CM | POA: Diagnosis not present

## 2021-11-26 DIAGNOSIS — N186 End stage renal disease: Secondary | ICD-10-CM

## 2021-11-26 DIAGNOSIS — Z8614 Personal history of Methicillin resistant Staphylococcus aureus infection: Secondary | ICD-10-CM | POA: Diagnosis not present

## 2021-11-26 DIAGNOSIS — I132 Hypertensive heart and chronic kidney disease with heart failure and with stage 5 chronic kidney disease, or end stage renal disease: Secondary | ICD-10-CM | POA: Diagnosis not present

## 2021-11-26 DIAGNOSIS — I129 Hypertensive chronic kidney disease with stage 1 through stage 4 chronic kidney disease, or unspecified chronic kidney disease: Secondary | ICD-10-CM

## 2021-11-26 DIAGNOSIS — Z87891 Personal history of nicotine dependence: Secondary | ICD-10-CM | POA: Diagnosis not present

## 2021-11-26 DIAGNOSIS — Z9981 Dependence on supplemental oxygen: Secondary | ICD-10-CM | POA: Insufficient documentation

## 2021-11-26 DIAGNOSIS — Z01812 Encounter for preprocedural laboratory examination: Secondary | ICD-10-CM

## 2021-11-26 DIAGNOSIS — E1122 Type 2 diabetes mellitus with diabetic chronic kidney disease: Secondary | ICD-10-CM | POA: Diagnosis not present

## 2021-11-26 DIAGNOSIS — E785 Hyperlipidemia, unspecified: Secondary | ICD-10-CM

## 2021-11-26 DIAGNOSIS — I509 Heart failure, unspecified: Secondary | ICD-10-CM | POA: Insufficient documentation

## 2021-11-26 DIAGNOSIS — Z01818 Encounter for other preprocedural examination: Secondary | ICD-10-CM

## 2021-11-26 HISTORY — PX: AV FISTULA PLACEMENT: SHX1204

## 2021-11-26 LAB — POCT I-STAT, CHEM 8
BUN: 66 mg/dL — ABNORMAL HIGH (ref 8–23)
Calcium, Ion: 1.19 mmol/L (ref 1.15–1.40)
Chloride: 113 mmol/L — ABNORMAL HIGH (ref 98–111)
Creatinine, Ser: 5.4 mg/dL — ABNORMAL HIGH (ref 0.44–1.00)
Glucose, Bld: 238 mg/dL — ABNORMAL HIGH (ref 70–99)
HCT: 34 % — ABNORMAL LOW (ref 36.0–46.0)
Hemoglobin: 11.6 g/dL — ABNORMAL LOW (ref 12.0–15.0)
Potassium: 3.3 mmol/L — ABNORMAL LOW (ref 3.5–5.1)
Sodium: 141 mmol/L (ref 135–145)
TCO2: 19 mmol/L — ABNORMAL LOW (ref 22–32)

## 2021-11-26 LAB — GLUCOSE, CAPILLARY: Glucose-Capillary: 175 mg/dL — ABNORMAL HIGH (ref 70–99)

## 2021-11-26 LAB — ABO/RH: ABO/RH(D): O POS

## 2021-11-26 SURGERY — ARTERIOVENOUS (AV) FISTULA CREATION
Anesthesia: General | Laterality: Left

## 2021-11-26 MED ORDER — SUCCINYLCHOLINE CHLORIDE 200 MG/10ML IV SOSY
PREFILLED_SYRINGE | INTRAVENOUS | Status: DC | PRN
  Administered 2021-11-26: 120 mg via INTRAVENOUS

## 2021-11-26 MED ORDER — SODIUM CHLORIDE 0.9 % IV SOLN: INTRAVENOUS | Status: DC

## 2021-11-26 MED ORDER — CEFAZOLIN SODIUM-DEXTROSE 2-4 GM/100ML-% IV SOLN
2.0000 g | INTRAVENOUS | Status: AC
  Administered 2021-11-26: 2 g via INTRAVENOUS

## 2021-11-26 MED ORDER — ROCURONIUM BROMIDE 100 MG/10ML IV SOLN
INTRAVENOUS | Status: DC | PRN
  Administered 2021-11-26: 40 mg via INTRAVENOUS
  Administered 2021-11-26: 10 mg via INTRAVENOUS

## 2021-11-26 MED ORDER — INSULIN ASPART 100 UNIT/ML IJ SOLN
3.0000 [IU] | Freq: Once | INTRAMUSCULAR | Status: AC
  Administered 2021-11-26: 3 [IU] via SUBCUTANEOUS

## 2021-11-26 MED ORDER — SODIUM CHLORIDE 0.9 % IV SOLN
INTRAVENOUS | Status: DC | PRN
  Administered 2021-11-26: 501 mL

## 2021-11-26 MED ORDER — CHLORHEXIDINE GLUCONATE 0.12 % MT SOLN: 15.0000 mL | Freq: Once | OROMUCOSAL | Status: AC

## 2021-11-26 MED ORDER — ACETAMINOPHEN 10 MG/ML IV SOLN
INTRAVENOUS | Status: DC | PRN
  Administered 2021-11-26: 1000 mg via INTRAVENOUS

## 2021-11-26 MED ORDER — BUPIVACAINE HCL (PF) 0.5 % IJ SOLN
INTRAMUSCULAR | Status: AC
  Filled 2021-11-26: qty 30

## 2021-11-26 MED ORDER — ORAL CARE MOUTH RINSE: 15.0000 mL | Freq: Once | OROMUCOSAL | Status: AC

## 2021-11-26 MED ORDER — CHLORHEXIDINE GLUCONATE 0.12 % MT SOLN
OROMUCOSAL | Status: AC
  Administered 2021-11-26: 15 mL via OROMUCOSAL
  Filled 2021-11-26: qty 15

## 2021-11-26 MED ORDER — GLYCOPYRROLATE 0.2 MG/ML IJ SOLN
INTRAMUSCULAR | Status: DC | PRN
  Administered 2021-11-26: .2 mg via INTRAVENOUS

## 2021-11-26 MED ORDER — CEFAZOLIN SODIUM-DEXTROSE 2-4 GM/100ML-% IV SOLN
INTRAVENOUS | Status: AC
  Filled 2021-11-26: qty 100

## 2021-11-26 MED ORDER — PROPOFOL 1000 MG/100ML IV EMUL
INTRAVENOUS | Status: AC
  Filled 2021-11-26: qty 100

## 2021-11-26 MED ORDER — CHLORHEXIDINE GLUCONATE CLOTH 2 % EX PADS
6.0000 | MEDICATED_PAD | Freq: Once | CUTANEOUS | Status: AC
  Administered 2021-11-26: 6 via TOPICAL

## 2021-11-26 MED ORDER — FENTANYL CITRATE (PF) 100 MCG/2ML IJ SOLN
INTRAMUSCULAR | Status: AC
  Filled 2021-11-26: qty 2

## 2021-11-26 MED ORDER — HYDROMORPHONE HCL 1 MG/ML IJ SOLN: 0.2500 mg | INTRAMUSCULAR | Status: DC | PRN

## 2021-11-26 MED ORDER — FENTANYL CITRATE (PF) 100 MCG/2ML IJ SOLN
INTRAMUSCULAR | Status: DC | PRN
  Administered 2021-11-26 (×2): 25 ug via INTRAVENOUS

## 2021-11-26 MED ORDER — LIDOCAINE HCL (CARDIAC) PF 100 MG/5ML IV SOSY
PREFILLED_SYRINGE | INTRAVENOUS | Status: DC | PRN
  Administered 2021-11-26: 100 mg via INTRAVENOUS

## 2021-11-26 MED ORDER — SUGAMMADEX SODIUM 500 MG/5ML IV SOLN
INTRAVENOUS | Status: DC | PRN
  Administered 2021-11-26: 500 mg via INTRAVENOUS

## 2021-11-26 MED ORDER — MIDAZOLAM HCL 2 MG/2ML IJ SOLN
INTRAMUSCULAR | Status: AC
  Filled 2021-11-26: qty 2

## 2021-11-26 MED ORDER — INSULIN ASPART 100 UNIT/ML IJ SOLN
INTRAMUSCULAR | Status: AC
  Filled 2021-11-26: qty 1

## 2021-11-26 MED ORDER — OXYCODONE HCL 5 MG/5ML PO SOLN: 5.0000 mg | Freq: Once | ORAL | Status: DC | PRN

## 2021-11-26 MED ORDER — HYDROCODONE-ACETAMINOPHEN 5-325 MG PO TABS: 1.0000 | ORAL_TABLET | Freq: Four times a day (QID) | ORAL | 0 refills | Status: AC | PRN

## 2021-11-26 MED ORDER — CHLORHEXIDINE GLUCONATE CLOTH 2 % EX PADS: 6.0000 | MEDICATED_PAD | Freq: Once | CUTANEOUS | Status: DC

## 2021-11-26 MED ORDER — PHENYLEPHRINE 80 MCG/ML (10ML) SYRINGE FOR IV PUSH (FOR BLOOD PRESSURE SUPPORT)
PREFILLED_SYRINGE | INTRAVENOUS | Status: DC | PRN
  Administered 2021-11-26: 160 ug via INTRAVENOUS
  Administered 2021-11-26: 80 ug via INTRAVENOUS

## 2021-11-26 MED ORDER — ONDANSETRON HCL 4 MG/2ML IJ SOLN
INTRAMUSCULAR | Status: DC | PRN
  Administered 2021-11-26 (×2): 4 mg via INTRAVENOUS

## 2021-11-26 MED ORDER — PROPOFOL 10 MG/ML IV BOLUS
INTRAVENOUS | Status: DC | PRN
  Administered 2021-11-26: 100 mg via INTRAVENOUS

## 2021-11-26 MED ORDER — PHENYLEPHRINE HCL-NACL 20-0.9 MG/250ML-% IV SOLN
INTRAVENOUS | Status: DC | PRN
  Administered 2021-11-26: 30 ug/min via INTRAVENOUS

## 2021-11-26 MED ORDER — BUPIVACAINE LIPOSOME 1.3 % IJ SUSP
INTRAMUSCULAR | Status: AC
  Filled 2021-11-26: qty 20

## 2021-11-26 MED ORDER — OXYCODONE HCL 5 MG PO TABS: 5.0000 mg | ORAL_TABLET | Freq: Once | ORAL | Status: DC | PRN

## 2021-11-26 MED ORDER — HEPARIN SODIUM (PORCINE) 5000 UNIT/ML IJ SOLN
INTRAMUSCULAR | Status: AC
  Filled 2021-11-26: qty 1

## 2021-11-26 MED ORDER — BUPIVACAINE LIPOSOME 1.3 % IJ SUSP
INTRAMUSCULAR | Status: DC | PRN
  Administered 2021-11-26: 50 mL via INTRAMUSCULAR

## 2021-11-26 SURGICAL SUPPLY — 59 items
ADH SKN CLS APL DERMABOND .7 (GAUZE/BANDAGES/DRESSINGS) ×1
APL PRP STRL LF DISP 70% ISPRP (MISCELLANEOUS) ×2
APPLIER CLIP 11 MED OPEN (CLIP)
APPLIER CLIP 9.375 SM OPEN (CLIP)
APR CLP MED 11 20 MLT OPN (CLIP)
APR CLP SM 9.3 20 MLT OPN (CLIP)
BAG DECANTER FOR FLEXI CONT (MISCELLANEOUS) ×1 IMPLANT
BLADE SURG SZ11 CARB STEEL (BLADE) ×1 IMPLANT
BOOT SUTURE AID YELLOW STND (SUTURE) ×1 IMPLANT
BRUSH SCRUB EZ  4% CHG (MISCELLANEOUS) ×1
BRUSH SCRUB EZ 4% CHG (MISCELLANEOUS) ×1 IMPLANT
CHLORAPREP W/TINT 26 (MISCELLANEOUS) ×1 IMPLANT
CLIP APPLIE 11 MED OPEN (CLIP) IMPLANT
CLIP APPLIE 9.375 SM OPEN (CLIP) IMPLANT
DERMABOND ADVANCED .7 DNX12 (GAUZE/BANDAGES/DRESSINGS) ×1 IMPLANT
DRESSING SURGICEL FIBRLLR 1X2 (HEMOSTASIS) ×1 IMPLANT
DRSG SURGICEL FIBRILLAR 1X2 (HEMOSTASIS)
ELECT CAUTERY BLADE 6.4 (BLADE) ×1 IMPLANT
ELECT REM PT RETURN 9FT ADLT (ELECTROSURGICAL) ×1
ELECTRODE REM PT RTRN 9FT ADLT (ELECTROSURGICAL) ×1 IMPLANT
GEL ULTRASOUND 20GR AQUASONIC (MISCELLANEOUS) IMPLANT
GLOVE BIO SURGEON STRL SZ7 (GLOVE) ×1 IMPLANT
GLOVE SURG SYN 8.0 (GLOVE) ×1 IMPLANT
GLOVE SURG SYN 8.0 PF PI (GLOVE) ×1 IMPLANT
GOWN STRL REUS W/ TWL LRG LVL3 (GOWN DISPOSABLE) ×2 IMPLANT
GOWN STRL REUS W/ TWL XL LVL3 (GOWN DISPOSABLE) ×1 IMPLANT
GOWN STRL REUS W/TWL LRG LVL3 (GOWN DISPOSABLE) ×2
GOWN STRL REUS W/TWL XL LVL3 (GOWN DISPOSABLE) ×1
IV NS 500ML (IV SOLUTION) ×1
IV NS 500ML BAXH (IV SOLUTION) ×1 IMPLANT
KIT TURNOVER KIT A (KITS) ×1 IMPLANT
LABEL OR SOLS (LABEL) ×1 IMPLANT
LOOP VESSEL MAXI  1X406 RED (MISCELLANEOUS) ×1
LOOP VESSEL MAXI 1X406 RED (MISCELLANEOUS) ×1 IMPLANT
LOOP VESSEL MINI 0.8X406 BLUE (MISCELLANEOUS) ×2 IMPLANT
MANIFOLD NEPTUNE II (INSTRUMENTS) ×1 IMPLANT
NDL FILTER BLUNT 18X1 1/2 (NEEDLE) ×1 IMPLANT
NDL HYPO 30X.5 LL (NEEDLE) IMPLANT
NEEDLE FILTER BLUNT 18X1 1/2 (NEEDLE) ×1 IMPLANT
NEEDLE HYPO 30X.5 LL (NEEDLE) IMPLANT
PACK EXTREMITY ARMC (MISCELLANEOUS) ×1 IMPLANT
PAD PREP 24X41 OB/GYN DISP (PERSONAL CARE ITEMS) ×1 IMPLANT
STOCKINETTE 48X4 2 PLY STRL (GAUZE/BANDAGES/DRESSINGS) ×1 IMPLANT
STOCKINETTE STRL 4IN 9604848 (GAUZE/BANDAGES/DRESSINGS) ×1 IMPLANT
SUT MNCRL+ 5-0 UNDYED PC-3 (SUTURE) ×1 IMPLANT
SUT MONOCRYL 5-0 (SUTURE) ×1
SUT PROLENE 6 0 BV (SUTURE) ×4 IMPLANT
SUT SILK 2 0 (SUTURE) ×1
SUT SILK 2-0 18XBRD TIE 12 (SUTURE) ×1 IMPLANT
SUT SILK 3 0 (SUTURE)
SUT SILK 3-0 18XBRD TIE 12 (SUTURE) ×1 IMPLANT
SUT SILK 4 0 (SUTURE) ×1
SUT SILK 4-0 18XBRD TIE 12 (SUTURE) ×1 IMPLANT
SUT VIC AB 3-0 SH 27 (SUTURE) ×1
SUT VIC AB 3-0 SH 27X BRD (SUTURE) ×1 IMPLANT
SYR 20ML LL LF (SYRINGE) ×1 IMPLANT
SYR 3ML LL SCALE MARK (SYRINGE) ×1 IMPLANT
TRAP FLUID SMOKE EVACUATOR (MISCELLANEOUS) ×1 IMPLANT
WATER STERILE IRR 500ML POUR (IV SOLUTION) ×1 IMPLANT

## 2021-11-26 NOTE — Anesthesia Postprocedure Evaluation (Signed)
Anesthesia Post Note  Patient: Emily [name]  Procedure(s) Performed: ARTERIOVENOUS (AV) FISTULA CREATION ( BRACHIALCEPHALIC) (Left)  Patient location during evaluation: PACU Anesthesia Type: General Level of consciousness: awake and alert Pain management: pain level controlled Vital Signs Assessment: post-procedure vital signs reviewed and stable Respiratory status: spontaneous breathing, nonlabored ventilation, respiratory function stable and patient connected to nasal cannula oxygen Cardiovascular status: blood pressure returned to baseline and stable Postop Assessment: no apparent nausea or vomiting Anesthetic complications: yes   Encounter Notable Events  Notable Event Outcome Phase Comment  Difficult to intubate - expected  Intraprocedure Filed from anesthesia note documentation.     Last Vitals:  Vitals:   11/26/21 1015 11/26/21 1030  BP: 113/61 104/60  Pulse: 86 83  Resp: 19 12  Temp:    SpO2: 93% 94%    Last Pain:  Vitals:   11/26/21 1030  TempSrc:   PainSc: 0-No pain                 Ilene Qua

## 2021-11-26 NOTE — Anesthesia Procedure Notes (Signed)
Procedure Name: Intubation Date/Time: 11/26/2021 7:40 AM  Performed by: Kelton Pillar, CRNAPre-anesthesia Checklist: Patient identified, Emergency Drugs available, Suction available and Patient being monitored Patient Re-evaluated:Patient Re-evaluated prior to induction Oxygen Delivery Method: Circle system utilized Preoxygenation: Pre-oxygenation with 100% oxygen Induction Type: IV induction Ventilation: Mask ventilation without difficulty Laryngoscope Size: McGraph and 3 Grade View: Grade I Tube type: Oral Tube size: 7.0 mm Number of attempts: 1 Airway Equipment and Method: Stylet and Oral airway Placement Confirmation: ETT inserted through vocal cords under direct vision, positive ETCO2, breath sounds checked- equal and bilateral and CO2 detector Secured at: 21 cm Tube secured with: Tape Dental Injury: Teeth and Oropharynx as per pre-operative assessment  Difficulty Due To: Difficulty was anticipated and Difficult Airway- due to dentition Future Recommendations: Recommend- induction with short-acting agent, and alternative techniques readily available

## 2021-11-26 NOTE — Anesthesia Preprocedure Evaluation (Addendum)
Anesthesia Evaluation  Patient identified by MRN, date of birth, ID band Patient awake    Reviewed: Allergy & Precautions, NPO status , Patient's Chart, lab work & pertinent test results  History of Anesthesia Complications Negative for: history of anesthetic complications  Airway Mallampati: III  TM Distance: >3 FB Neck ROM: full    Dental  (+) Chipped, Poor Dentition   Pulmonary shortness of breath, asthma , sleep apnea , COPD,  oxygen dependent, former smoker Recent COPD exacerbation, discharged 11/20, on 4L [place]    Pulmonary exam normal        Cardiovascular hypertension, (-) angina +CHF  Normal cardiovascular exam     Neuro/Psych negative neurological ROS  negative psych ROS   GI/Hepatic negative GI ROS, Neg liver ROS,,,  Endo/Other  diabetesHypothyroidism    Renal/GU Renal disease     Musculoskeletal   Abdominal   Peds  Hematology  (+) Blood dyscrasia, anemia   Anesthesia Other Findings Past Medical History: No date: Anemia No date: Arthritis     Comment:  hands, all over No date: Asthma No date: CHF (congestive heart failure) (HCC) No date: Chronic respiratory failure with hypoxia (HCC) No date: Chronic venous insufficiency No date: COPD (chronic obstructive pulmonary disease) (HCC)     Comment:  on 2L home o2 No date: Diabetes mellitus without complication (HCC)     Comment:  type 2 No date: Dyspnea No date: ESRD (end stage renal disease) ([place]) 2015: History of methicillin resistant staphylococcus aureus (MRSA) No date: Hyperlipidemia No date: Hypertension No date: Hyperthyroidism No date: Lymphedema No date: OSA on CPAP No date: Oxygen dependent     Comment:  4 L [place] 10/17/2021: Pneumonia No date: Puerperal sepsis with acute hypoxic respiratory failure (HCC) No date: Stroke [facility])     Comment:  Was told at recent hospital visit she has stroke in past No date: Thyroid disease 04/30/2016:  Thyroid nodule  Past Surgical History: No date: arm surgery; Left 02/14/2019: COLONOSCOPY WITH PROPOFOL; N/A     Comment:  Procedure: COLONOSCOPY WITH PROPOFOL;  Surgeon: [name], MD;  Location: [facility] ENDOSCOPY;  Service:               Endoscopy;  Laterality: N/A;  BMI    Body Mass Index: 38.28 kg/m      Reproductive/Obstetrics negative OB ROS                             Anesthesia Physical Anesthesia Plan  ASA: 3  Anesthesia Plan: General ETT   Post-op Pain Management: Ofirmev IV (intra-op)* and Dilaudid IV   Induction: Intravenous  PONV Risk Score and Plan: 3 and Ondansetron, Dexamethasone, Midazolam and Treatment may vary due to age or medical condition  Airway Management Planned: Oral ETT  Additional Equipment:   Intra-op Plan:   Post-operative Plan: Extubation in OR  Informed Consent: I have reviewed the patients History and Physical, chart, labs and discussed the procedure including the risks, benefits and alternatives for the proposed anesthesia with the patient or authorized representative who has indicated his/her understanding and acceptance.     Dental Advisory Given  Plan Discussed with: Anesthesiologist, CRNA and Surgeon  Anesthesia Plan Comments: (Patient consented for risks of anesthesia including but not limited to:  - adverse reactions to medications - damage to eyes, teeth, lips or other oral mucosa - nerve damage  due to positioning  - sore throat or hoarseness - Damage to heart, brain, nerves, lungs, other parts of body or loss of life  Patient voiced understanding.)        Anesthesia Quick Evaluation

## 2021-11-26 NOTE — Progress Notes (Signed)
Notified Dr. Danne Baxter of patient BG 175, no new orders at this time. Also patient is saturating at 90-93% on her baseline of 4L/min [place]. Per Dr. Danne Baxter this is okay. Patient is doing well, no reports of chest pain, shortness of breath, or pain at surgical site. Will continue to monitor.

## 2021-11-26 NOTE — Op Note (Signed)
     OPERATIVE NOTE   PROCEDURE: left brachial cephalic arteriovenous fistula placement  PRE-OPERATIVE DIAGNOSIS: End Stage Renal Disease  POST-OPERATIVE DIAGNOSIS: End Stage Renal Disease  SURGEON: Belenda Cruise Asharia Lotter  ASSISTANT(S): Annalee Genta, NP  ANESTHESIA: general  ESTIMATED BLOOD LOSS: 15 cc  FINDING(S): 4 mm cephalic vein  SPECIMEN(S):  none  INDICATIONS:   Emily [name] is a 61 y.o. female who presents with end stage renal disease.  The patient is scheduled for left brachiocephalic arteriovenous fistula placement.  The patient is aware the risks include but are not limited to: bleeding, infection, steal syndrome, nerve damage, ischemic monomelic neuropathy, failure to mature, and need for additional procedures.  The patient is aware of the risks of the procedure and elects to proceed forward.  DESCRIPTION: After full informed written consent was obtained from the patient, the patient was brought back to the operating room and placed supine upon the operating table.  Prior to induction, the patient received IV antibiotics.   After obtaining adequate anesthesia, the patient was then prepped and draped in the standard fashion for a left arm access procedure.   A first assistant was required to provide a safe and appropriate environment for executing the surgery.  The assistant was integral in providing retraction, exposure, running suture providing suction and in the closing process.   A curvilinear incision was then created midway between the radial impulse and the cephalic vein. The cephalic vein was then identified and dissected circumferentially. It was marked with a surgical marker.    Attention was then turned to the brachial artery which was exposed through the same incision and looped proximally and distally. Side branches were controlled with 4-0 silk ties.  The distal segment of the vein was ligated with a  2-0 silk, and the vein was transected.  The proximal  segment was interrogated with serial dilators.  The vein accepted up to a 4 mm dilator without any difficulty. Heparinized saline was infused into the vein and clamped it with a small bulldog.  At this point, I reset my exposure of the brachial artery and controlled the artery with vessel loops proximally and distally.  An arteriotomy was then made with a #11 blade, and extended with a Potts scissor.  Heparinized saline was injected proximal and distal into the radial artery.  The vein was then approximated to the artery while the artery was in its native bed and subsequently the vein was beveled using Potts scissors. The vein was then sewn to the artery in an end-to-side configuration with a running stitch of 6-0 Prolene.  Prior to completing this anastomosis Flushing maneuvers were performed and the artery was allowed to forward and back bleed.  There was no evidence of clot from any vessels.  I completed the anastomosis in the usual fashion and then released all vessel loops and clamps.    There was good  thrill in the venous outflow, and there was 1+ palpable radial pulse.  At this point, I irrigated out the surgical wound.  There was no further active bleeding.  The subcutaneous tissue was reapproximated with a running stitch of 3-0 Vicryl.  The skin was then reapproximated with a running subcuticular stitch of 4-0 Vicryl.  The skin was then cleaned, dried, and reinforced with Dermabond.    The patient tolerated this procedure well.   COMPLICATIONS: None  CONDITION: [name]  Office: [phone]   11/26/2021, 9:42 AM

## 2021-11-26 NOTE — Progress Notes (Signed)
MRN : [id number]   Emily [name] is a 61 y.o. (16-May-1960) female who presents with chief complaint of check access.   History of Present Illness:    The patient has chronic renal insufficiency stage 5 secondary to diabetes. The patient's most recent creatinine clearance is less than 15. The patient volume status has not yet become an issue. Patient's blood pressures are elevated. There are mild uremic symptoms which appear to be relatively well tolerated at this time.   The patient notes the kidney problem has been present for a long time and has been progressively getting worse.   The patient is followed by nephrology.     The patient is right-handed.   The patient notes that she is not scheduled for dialysis at this point time however due to the successively worsening renal function she was referred by her nephrologist for evaluation in preparation for fistula so that it may mature prior to need.   No recent shortening of the patient's walking distance or new symptoms consistent with claudication.  No history of rest pain symptoms. No new ulcers or wounds of the lower extremities have occurred.   The patient denies amaurosis fugax or recent TIA symptoms. There are no recent neurological changes noted. There is no history of DVT, PE or superficial thrombophlebitis. No recent episodes of angina or shortness of breath documented.     Active Medications      Current Meds  Medication Sig   albuterol (PROVENTIL HFA;VENTOLIN HFA) 108 (90 Base) MCG/ACT inhaler Inhale 1-2 puffs into the lungs every 6 (six) hours as needed for wheezing or shortness of breath.   Armodafinil (NUVIGIL) 50 MG tablet Take 1 tablet by mouth every morning.   aspirin EC 81 MG tablet Take 81 mg by mouth daily.    atorvastatin (LIPITOR) 40 MG tablet TAKE ONE TABLET BY MOUTH EVERY DAY (Patient taking differently: Take 40 mg by mouth at bedtime.)   azithromycin (ZITHROMAX) 250 MG tablet Take 1 tablet (250 mg total) by mouth daily at 6 PM for 3 days.   calcitRIOL (ROCALTROL) 0.25 MCG capsule Take 1 capsule (0.25 mcg total) by mouth 3 (three) times a week.   carvedilol (COREG) 25 MG tablet Take 1 tablet by mouth twice daily (Patient taking differently: Take 25 mg by mouth 2 (two) times daily with a meal.)   esomeprazole (NEXIUM) 40 MG capsule Take 40 mg by mouth every morning.   fluticasone (FLONASE) 50 MCG/ACT nasal spray Place 1 spray into both nostrils daily.   hydrALAZINE (APRESOLINE) 25 MG tablet Take 1 tablet (25 mg total) by mouth every 8 (eight) hours.   insulin glargine (LANTUS SOLOSTAR) 100 UNIT/ML Solostar Pen Inject 5 Units into the skin at bedtime. INJECT 30 UNITS SUBCUTANEOUSLY IN THE MORNING AND 30 UNITS AT NIGHT (Patient taking differently: Inject 38 Units into the skin at bedtime.)   NOVOLOG FLEXPEN 100 UNIT/ML FlexPen Check sugars first if above 170 can give 3 units with meals (Patient taking differently: 3 Units 3 (three) times daily with meals.  Check sugars first if above 170 can give 3 units with meals)   OXYGEN Inhale 4 L into the lungs continuous.   predniSONE (DELTASONE) 20 MG tablet Take 2 tablets (40 mg total) by mouth daily with breakfast for 5 days.   tiotropium (SPIRIVA) 18 MCG inhalation capsule Place 18 mcg into inhaler and inhale daily.   torsemide (DEMADEX) 20 MG tablet Take 1 tablet (20 mg total) by mouth every morning.            Past Medical History:  Diagnosis Date   Anemia     Arthritis      hands, all over   Asthma     CHF (congestive heart failure) (HCC)     Chronic respiratory failure with hypoxia (HCC)     Chronic venous insufficiency     COPD (chronic obstructive pulmonary disease) (HCC)      on 2L home o2   Diabetes mellitus without complication (HCC)       type 2   Dyspnea     ESRD (end stage renal disease) (HCC)     History of methicillin resistant staphylococcus aureus (MRSA) 2015   Hyperlipidemia     Hypertension     Hyperthyroidism     Lymphedema     OSA on CPAP     Oxygen dependent      4 L [place]   Pneumonia 10/17/2021   Puerperal sepsis with acute hypoxic respiratory failure ([place])     Stroke [facility])      Was told at recent hospital visit she has stroke in past   Thyroid disease     Thyroid nodule 04/30/2016           Past Surgical History:  Procedure Laterality Date   arm surgery Left     COLONOSCOPY WITH PROPOFOL N/A 02/14/2019    Procedure: COLONOSCOPY WITH PROPOFOL;  Surgeon: [name], MD;  Location: [facility] ENDOSCOPY;  Service: Endoscopy;  Laterality: N/A;      Social History Social History         Tobacco Use   Smoking status: Former      Packs/day: 1.50      Years: 40.00      Total pack years: 60.00      Types: Cigarettes      Quit date: 01/13/2016      Years since quitting: 5.8   Smokeless tobacco: Never  Vaping Use   Vaping Use: Never used  Substance Use Topics   Alcohol use: No   Drug use: No      Family History      Family History  Problem Relation Age of Onset   Diabetes Mother     Hypertension Mother     Diabetes Father     Hypertension Father     CAD Father     Diabetes Sister        No Known Allergies     REVIEW OF SYSTEMS (Negative unless checked)   Constitutional: '[]'$ Weight loss  '[]'$ Fever  '[]'$ Chills Cardiac: '[]'$ Chest pain   '[]'$ Chest pressure   '[]'$ Palpitations   '[]'$ Shortness of breath when laying flat   '[]'$ Shortness of breath with exertion. Vascular:  '[]'$ Pain in legs with walking   '[]'$ Pain in legs at rest  '[]'$ History of DVT   '[]'$ Phlebitis   '[]'$ Swelling in legs   '[]'$ Varicose veins   '[]'$ Non-healing ulcers Pulmonary:   '[]'$ Uses home oxygen   '[]'$ Productive cough   '[]'$ Hemoptysis   '[]'$ Wheeze  '[]'$ COPD   '[]'$ Asthma Neurologic:  '[]'$   Dizziness   '[]'$ Seizures   '[]'$ History of stroke   '[]'$ History of TIA  '[]'$ Aphasia   '[]'$ Vissual  changes   '[]'$ Weakness or numbness in arm   '[]'$ Weakness or numbness in leg Musculoskeletal:   '[]'$ Joint swelling   '[]'$ Joint pain   '[]'$ Low back pain Hematologic:  '[]'$ Easy bruising  '[]'$ Easy bleeding   '[]'$ Hypercoagulable state   '[]'$ Anemic Gastrointestinal:  '[]'$ Diarrhea   '[]'$ Vomiting  '[]'$ Gastroesophageal reflux/heartburn   '[]'$ Difficulty swallowing. Genitourinary:  '[x]'$ Chronic kidney disease   '[]'$ Difficult urination  '[]'$ Frequent urination   '[]'$ Blood in urine Skin:  '[]'$ Rashes   '[]'$ Ulcers  Psychological:  '[]'$ History of anxiety   '[]'$  History of major depression.   Physical Examination      Vitals:    11/26/21 0644  BP: (!) 190/91  Pulse: 85  Resp: (!) 22  Temp: 98.5 F (36.9 C)  TempSrc: Temporal  SpO2: 98%  Weight: 101.2 kg  Height: '5\' 4"'$  (1.626 m)    Body mass index is 38.28 kg/m. Gen: WD/WN, NAD Head: [place]/AT, No temporalis wasting.  Ear/Nose/Throat: Hearing grossly intact, nares w/o erythema or drainage Eyes: PER, EOMI, sclera nonicteric.  Neck: Supple, no gross masses or lesions.  No JVD.  Pulmonary:  Good air movement, no audible wheezing, no use of accessory muscles.  Cardiac: RRR, precordium non-hyperdynamic. Vascular:   no extremity access Vessel Right Left  Radial Palpable Palpable  Brachial Palpable Palpable  Gastrointestinal: soft, non-distended. No guarding/no peritoneal signs.  Musculoskeletal: M/S 5/5 throughout.  No deformity.  Neurologic: CN 2-12 intact. Pain and light touch intact in extremities.  Symmetrical.  Speech is fluent. Motor exam as listed above. Psychiatric: Judgment intact, Mood & affect appropriate for pt's clinical situation. Dermatologic: No rashes or ulcers noted.  No changes consistent with cellulitis.     CBC Recent Labs       Lab Results  Component Value Date    WBC 7.1 11/23/2021    HGB 11.6 (L) 11/26/2021    HCT 34.0 (L) 11/26/2021    MCV 88.2 11/23/2021    PLT 269 11/23/2021        BMET Labs (Brief)          Component Value Date/Time    NA 141  11/26/2021 0641    NA 142 05/14/2016 1852    NA 140 06/02/2013 1230    K 3.3 (L) 11/26/2021 0641    K 3.5 06/02/2013 1230    CL 113 (H) 11/26/2021 0641    CL 107 06/02/2013 1230    CO2 21 (L) 11/23/2021 0530    CO2 26 06/02/2013 1230    GLUCOSE 238 (H) 11/26/2021 0641    GLUCOSE 178 (H) 06/02/2013 1230    BUN 66 (H) 11/26/2021 0641    BUN 27 (H) 05/14/2016 1852    BUN 14 06/02/2013 1230    CREATININE 5.40 (H) 11/26/2021 0641    CREATININE 1.87 (H) 04/05/2018 1008    CALCIUM 8.6 (L) 11/23/2021 0530    CALCIUM 9.5 06/02/2013 1230    GFRNONAA 10 (L) 11/23/2021 0530    GFRNONAA 29 (L) 04/05/2018 1008    GFRAA 22 (L) 04/25/2019 1138    GFRAA 34 (L) 04/05/2018 1008      Estimated Creatinine Clearance: 12.3 mL/min (A) (by C-G formula based on SCr of 5.4 mg/dL (H)).   COAG Recent Labs       Lab Results  Component Value Date    INR 0.94 08/13/2016        Radiology  Imaging Results  DG  Chest 2 View   Result Date: 11/22/2021 CLINICAL DATA:  Shortness of breath EXAM: CHEST - 2 VIEW COMPARISON:  October 17, 2021 FINDINGS: The heart size and mediastinal contours are within normal limits. Both lungs are clear. The visualized skeletal structures are unremarkable. IMPRESSION: No active cardiopulmonary disease. Electronically Signed   By: Dorise Bullion III M.D.   On: 11/22/2021 16:46    US Venous Img Lower Bilateral (DVT)   Result Date: 10/27/2021 CLINICAL DATA:  61 year old female with elevated D-dimer and lower extremity edema. EXAM: BILATERAL LOWER EXTREMITY VENOUS DOPPLER ULTRASOUND TECHNIQUE: Gray-scale sonography with graded compression, as well as color Doppler and duplex ultrasound were performed to evaluate the lower extremity deep venous systems from the level of the common femoral vein and including the common femoral, femoral, profunda femoral, popliteal and calf veins including the posterior tibial, peroneal and gastrocnemius veins when visible. The superficial great  saphenous vein was also interrogated. Spectral Doppler was utilized to evaluate flow at rest and with distal augmentation maneuvers in the common femoral, femoral and popliteal veins. COMPARISON:  None Available. FINDINGS: RIGHT LOWER EXTREMITY Common Femoral Vein: No evidence of thrombus. Normal compressibility, respiratory phasicity and response to augmentation. Saphenofemoral Junction: No evidence of thrombus. Normal compressibility and flow on color Doppler imaging. Profunda Femoral Vein: No evidence of thrombus. Normal compressibility and flow on color Doppler imaging. Femoral Vein: No evidence of thrombus. Normal compressibility, respiratory phasicity and response to augmentation. Popliteal Vein: No evidence of thrombus. Normal compressibility, respiratory phasicity and response to augmentation. Calf Veins: No evidence of thrombus. Normal compressibility and flow on color Doppler imaging. Superficial Great Saphenous Vein: No evidence of thrombus. Normal compressibility. Venous Reflux:  None. Other Findings:  None. LEFT LOWER EXTREMITY Common Femoral Vein: No evidence of thrombus. Normal compressibility, respiratory phasicity and response to augmentation. Saphenofemoral Junction: No evidence of thrombus. Normal compressibility and flow on color Doppler imaging. Profunda Femoral Vein: No evidence of thrombus. Normal compressibility and flow on color Doppler imaging. Femoral Vein: No evidence of thrombus. Normal compressibility, respiratory phasicity and response to augmentation. Popliteal Vein: No evidence of thrombus. Normal compressibility, respiratory phasicity and response to augmentation. Calf Veins: No evidence of thrombus. Normal compressibility and flow on color Doppler imaging. Superficial Great Saphenous Vein: No evidence of thrombus. Normal compressibility. Venous Reflux:  None. Other Findings:  None. IMPRESSION: No evidence of deep venous thrombosis in either lower extremity. Electronically Signed    By: Jacqulynn Cadet M.D.   On: 10/27/2021 15:57         Assessment/Plan 1. CKD stage 4 due to type 2 diabetes mellitus ([place]) Recommend:   At this time the patient does not have appropriate extremity access for dialysis   Patient should have a left brachiocephalic AV fistula created.   The risks, benefits and alternative therapies were reviewed in detail with the patient.  All questions were answered.  The patient agrees to proceed with surgery.    The patient [name] follow up with me in the office after the surgery.    2. Lymphedema Lower extremity edema well-controlled today.  Advised to continue with conservative therapy including use of medical grade compression, elevation and activity   3. Essential hypertension Continue antihypertensive medications as already ordered, these medications have been reviewed and there are no changes at this time.    4. Mixed hyperlipidemia Continue statin as ordered and reviewed, no changes at this time          Hortencia Pilar, MD  11/26/2021 7:25 AM

## 2021-11-26 NOTE — Interval H&P Note (Signed)
History and Physical Interval Note:  11/26/2021 7:28 AM  Emily [name]  has presented today for surgery, with the diagnosis of CHRONIC KIDNEY DISEASE STAGE 5.  The various methods of treatment have been discussed with the patient and family. After consideration of risks, benefits and other options for treatment, the patient has consented to  Procedure(s): ARTERIOVENOUS (AV) FISTULA CREATION ( BRACHIALCEPHALIC) (Left) as a surgical intervention.  The patient's history has been reviewed, patient examined, no change in status, stable for surgery.  I have reviewed the patient's chart and labs.  Questions were answered to the patient's satisfaction.     Hortencia Pilar

## 2021-11-26 NOTE — Transfer of Care (Signed)
Immediate Anesthesia Transfer of Care Note  Patient: Emily [name]  Procedure(s) Performed: ARTERIOVENOUS (AV) FISTULA CREATION ( BRACHIALCEPHALIC) (Left)  Patient Location: PACU  Anesthesia Type:General  Level of Consciousness: drowsy and patient cooperative  Airway & Oxygen Therapy: Patient Spontanous Breathing and Patient connected to face mask oxygen  Post-op Assessment: Report given to RN and Post -op Vital signs reviewed and stable  Post vital signs: Reviewed and stable  Last Vitals:  Vitals Value Taken Time  BP    Temp    Pulse 87 11/26/21 1005  Resp 12 11/26/21 1004  SpO2 97 % 11/26/21 1005  Vitals shown include unvalidated device data.  Last Pain:  Vitals:   11/26/21 0644  TempSrc: Temporal  PainSc: 0-No pain         Complications:  Encounter Notable Events  Notable Event Outcome Phase Comment  Difficult to intubate - expected  Intraprocedure Filed from anesthesia note documentation.

## 2021-11-26 NOTE — H&P (View-Only) (Signed)
MRN : [id number]   Emily [name] is a 61 y.o. (Jun 04, 1960) female who presents with chief complaint of check access.   History of Present Illness:    The patient has chronic renal insufficiency stage 5 secondary to diabetes. The patient's most recent creatinine clearance is less than 15. The patient volume status has not yet become an issue. Patient's blood pressures are elevated. There are mild uremic symptoms which appear to be relatively well tolerated at this time.   The patient notes the kidney problem has been present for a long time and has been progressively getting worse.   The patient is followed by nephrology.     The patient is right-handed.   The patient notes that she is not scheduled for dialysis at this point time however due to the successively worsening renal function she was referred by her nephrologist for evaluation in preparation for fistula so that it may mature prior to need.   No recent shortening of the patient's walking distance or new symptoms consistent with claudication.  No history of rest pain symptoms. No new ulcers or wounds of the lower extremities have occurred.   The patient denies amaurosis fugax or recent TIA symptoms. There are no recent neurological changes noted. There is no history of DVT, PE or superficial thrombophlebitis. No recent episodes of angina or shortness of breath documented.     Active Medications      Current Meds  Medication Sig   albuterol (PROVENTIL HFA;VENTOLIN HFA) 108 (90 Base) MCG/ACT inhaler Inhale 1-2 puffs into the lungs every 6 (six) hours as needed for wheezing or shortness of breath.   Armodafinil (NUVIGIL) 50 MG tablet Take 1 tablet by mouth every morning.   aspirin EC 81 MG tablet Take 81 mg by mouth daily.    atorvastatin (LIPITOR) 40 MG tablet TAKE ONE TABLET BY MOUTH EVERY DAY (Patient taking differently: Take 40 mg by mouth at bedtime.)   azithromycin (ZITHROMAX) 250 MG tablet Take 1 tablet (250 mg total) by mouth daily at 6 PM for 3 days.   calcitRIOL (ROCALTROL) 0.25 MCG capsule Take 1 capsule (0.25 mcg total) by mouth 3 (three) times a week.   carvedilol (COREG) 25 MG tablet Take 1 tablet by mouth twice daily (Patient taking differently: Take 25 mg by mouth 2 (two) times daily with a meal.)   esomeprazole (NEXIUM) 40 MG capsule Take 40 mg by mouth every morning.   fluticasone (FLONASE) 50 MCG/ACT nasal spray Place 1 spray into both nostrils daily.   hydrALAZINE (APRESOLINE) 25 MG tablet Take 1 tablet (25 mg total) by mouth every 8 (eight) hours.   insulin glargine (LANTUS SOLOSTAR) 100 UNIT/ML Solostar Pen Inject 5 Units into the skin at bedtime. INJECT 30 UNITS SUBCUTANEOUSLY IN THE MORNING AND 30 UNITS AT NIGHT (Patient taking differently: Inject 38 Units into the skin at bedtime.)   NOVOLOG FLEXPEN 100 UNIT/ML FlexPen Check sugars first if above 170 can give 3 units with meals (Patient taking differently: 3 Units 3 (three) times daily with meals.  Check sugars first if above 170 can give 3 units with meals)   OXYGEN Inhale 4 L into the lungs continuous.   predniSONE (DELTASONE) 20 MG tablet Take 2 tablets (40 mg total) by mouth daily with breakfast for 5 days.   tiotropium (SPIRIVA) 18 MCG inhalation capsule Place 18 mcg into inhaler and inhale daily.   torsemide (DEMADEX) 20 MG tablet Take 1 tablet (20 mg total) by mouth every morning.            Past Medical History:  Diagnosis Date   Anemia     Arthritis      hands, all over   Asthma     CHF (congestive heart failure) (HCC)     Chronic respiratory failure with hypoxia (HCC)     Chronic venous insufficiency     COPD (chronic obstructive pulmonary disease) (HCC)      on 2L home o2   Diabetes mellitus without complication (HCC)       type 2   Dyspnea     ESRD (end stage renal disease) (HCC)     History of methicillin resistant staphylococcus aureus (MRSA) 2015   Hyperlipidemia     Hypertension     Hyperthyroidism     Lymphedema     OSA on CPAP     Oxygen dependent      4 L [place]   Pneumonia 10/17/2021   Puerperal sepsis with acute hypoxic respiratory failure ([place])     Stroke [facility])      Was told at recent hospital visit she has stroke in past   Thyroid disease     Thyroid nodule 04/30/2016           Past Surgical History:  Procedure Laterality Date   arm surgery Left     COLONOSCOPY WITH PROPOFOL N/A 02/14/2019    Procedure: COLONOSCOPY WITH PROPOFOL;  Surgeon: [name], MD;  Location: [facility] ENDOSCOPY;  Service: Endoscopy;  Laterality: N/A;      Social History Social History         Tobacco Use   Smoking status: Former      Packs/day: 1.50      Years: 40.00      Total pack years: 60.00      Types: Cigarettes      Quit date: 01/13/2016      Years since quitting: 5.8   Smokeless tobacco: Never  Vaping Use   Vaping Use: Never used  Substance Use Topics   Alcohol use: No   Drug use: No      Family History      Family History  Problem Relation Age of Onset   Diabetes Mother     Hypertension Mother     Diabetes Father     Hypertension Father     CAD Father     Diabetes Sister        No Known Allergies     REVIEW OF SYSTEMS (Negative unless checked)   Constitutional: '[]'$ Weight loss  '[]'$ Fever  '[]'$ Chills Cardiac: '[]'$ Chest pain   '[]'$ Chest pressure   '[]'$ Palpitations   '[]'$ Shortness of breath when laying flat   '[]'$ Shortness of breath with exertion. Vascular:  '[]'$ Pain in legs with walking   '[]'$ Pain in legs at rest  '[]'$ History of DVT   '[]'$ Phlebitis   '[]'$ Swelling in legs   '[]'$ Varicose veins   '[]'$ Non-healing ulcers Pulmonary:   '[]'$ Uses home oxygen   '[]'$ Productive cough   '[]'$ Hemoptysis   '[]'$ Wheeze  '[]'$ COPD   '[]'$ Asthma Neurologic:  '[]'$   Dizziness   '[]'$ Seizures   '[]'$ History of stroke   '[]'$ History of TIA  '[]'$ Aphasia   '[]'$ Vissual  changes   '[]'$ Weakness or numbness in arm   '[]'$ Weakness or numbness in leg Musculoskeletal:   '[]'$ Joint swelling   '[]'$ Joint pain   '[]'$ Low back pain Hematologic:  '[]'$ Easy bruising  '[]'$ Easy bleeding   '[]'$ Hypercoagulable state   '[]'$ Anemic Gastrointestinal:  '[]'$ Diarrhea   '[]'$ Vomiting  '[]'$ Gastroesophageal reflux/heartburn   '[]'$ Difficulty swallowing. Genitourinary:  '[x]'$ Chronic kidney disease   '[]'$ Difficult urination  '[]'$ Frequent urination   '[]'$ Blood in urine Skin:  '[]'$ Rashes   '[]'$ Ulcers  Psychological:  '[]'$ History of anxiety   '[]'$  History of major depression.   Physical Examination      Vitals:    11/26/21 0644  BP: (!) 190/91  Pulse: 85  Resp: (!) 22  Temp: 98.5 F (36.9 C)  TempSrc: Temporal  SpO2: 98%  Weight: 101.2 kg  Height: '5\' 4"'$  (1.626 m)    Body mass index is 38.28 kg/m. Gen: WD/WN, NAD Head: [place]/AT, No temporalis wasting.  Ear/Nose/Throat: Hearing grossly intact, nares w/o erythema or drainage Eyes: PER, EOMI, sclera nonicteric.  Neck: Supple, no gross masses or lesions.  No JVD.  Pulmonary:  Good air movement, no audible wheezing, no use of accessory muscles.  Cardiac: RRR, precordium non-hyperdynamic. Vascular:   no extremity access Vessel Right Left  Radial Palpable Palpable  Brachial Palpable Palpable  Gastrointestinal: soft, non-distended. No guarding/no peritoneal signs.  Musculoskeletal: M/S 5/5 throughout.  No deformity.  Neurologic: CN 2-12 intact. Pain and light touch intact in extremities.  Symmetrical.  Speech is fluent. Motor exam as listed above. Psychiatric: Judgment intact, Mood & affect appropriate for pt's clinical situation. Dermatologic: No rashes or ulcers noted.  No changes consistent with cellulitis.     CBC Recent Labs       Lab Results  Component Value Date    WBC 7.1 11/23/2021    HGB 11.6 (L) 11/26/2021    HCT 34.0 (L) 11/26/2021    MCV 88.2 11/23/2021    PLT 269 11/23/2021        BMET Labs (Brief)          Component Value Date/Time    NA 141  11/26/2021 0641    NA 142 05/14/2016 1852    NA 140 06/02/2013 1230    K 3.3 (L) 11/26/2021 0641    K 3.5 06/02/2013 1230    CL 113 (H) 11/26/2021 0641    CL 107 06/02/2013 1230    CO2 21 (L) 11/23/2021 0530    CO2 26 06/02/2013 1230    GLUCOSE 238 (H) 11/26/2021 0641    GLUCOSE 178 (H) 06/02/2013 1230    BUN 66 (H) 11/26/2021 0641    BUN 27 (H) 05/14/2016 1852    BUN 14 06/02/2013 1230    CREATININE 5.40 (H) 11/26/2021 0641    CREATININE 1.87 (H) 04/05/2018 1008    CALCIUM 8.6 (L) 11/23/2021 0530    CALCIUM 9.5 06/02/2013 1230    GFRNONAA 10 (L) 11/23/2021 0530    GFRNONAA 29 (L) 04/05/2018 1008    GFRAA 22 (L) 04/25/2019 1138    GFRAA 34 (L) 04/05/2018 1008      Estimated Creatinine Clearance: 12.3 mL/min (A) (by C-G formula based on SCr of 5.4 mg/dL (H)).   COAG Recent Labs       Lab Results  Component Value Date    INR 0.94 08/13/2016        Radiology  Imaging Results  DG  Chest 2 View   Result Date: 11/22/2021 CLINICAL DATA:  Shortness of breath EXAM: CHEST - 2 VIEW COMPARISON:  October 17, 2021 FINDINGS: The heart size and mediastinal contours are within normal limits. Both lungs are clear. The visualized skeletal structures are unremarkable. IMPRESSION: No active cardiopulmonary disease. Electronically Signed   By: Dorise Bullion III M.D.   On: 11/22/2021 16:46    US Venous Img Lower Bilateral (DVT)   Result Date: 10/27/2021 CLINICAL DATA:  61 year old female with elevated D-dimer and lower extremity edema. EXAM: BILATERAL LOWER EXTREMITY VENOUS DOPPLER ULTRASOUND TECHNIQUE: Gray-scale sonography with graded compression, as well as color Doppler and duplex ultrasound were performed to evaluate the lower extremity deep venous systems from the level of the common femoral vein and including the common femoral, femoral, profunda femoral, popliteal and calf veins including the posterior tibial, peroneal and gastrocnemius veins when visible. The superficial great  saphenous vein was also interrogated. Spectral Doppler was utilized to evaluate flow at rest and with distal augmentation maneuvers in the common femoral, femoral and popliteal veins. COMPARISON:  None Available. FINDINGS: RIGHT LOWER EXTREMITY Common Femoral Vein: No evidence of thrombus. Normal compressibility, respiratory phasicity and response to augmentation. Saphenofemoral Junction: No evidence of thrombus. Normal compressibility and flow on color Doppler imaging. Profunda Femoral Vein: No evidence of thrombus. Normal compressibility and flow on color Doppler imaging. Femoral Vein: No evidence of thrombus. Normal compressibility, respiratory phasicity and response to augmentation. Popliteal Vein: No evidence of thrombus. Normal compressibility, respiratory phasicity and response to augmentation. Calf Veins: No evidence of thrombus. Normal compressibility and flow on color Doppler imaging. Superficial Great Saphenous Vein: No evidence of thrombus. Normal compressibility. Venous Reflux:  None. Other Findings:  None. LEFT LOWER EXTREMITY Common Femoral Vein: No evidence of thrombus. Normal compressibility, respiratory phasicity and response to augmentation. Saphenofemoral Junction: No evidence of thrombus. Normal compressibility and flow on color Doppler imaging. Profunda Femoral Vein: No evidence of thrombus. Normal compressibility and flow on color Doppler imaging. Femoral Vein: No evidence of thrombus. Normal compressibility, respiratory phasicity and response to augmentation. Popliteal Vein: No evidence of thrombus. Normal compressibility, respiratory phasicity and response to augmentation. Calf Veins: No evidence of thrombus. Normal compressibility and flow on color Doppler imaging. Superficial Great Saphenous Vein: No evidence of thrombus. Normal compressibility. Venous Reflux:  None. Other Findings:  None. IMPRESSION: No evidence of deep venous thrombosis in either lower extremity. Electronically Signed    By: Jacqulynn Cadet M.D.   On: 10/27/2021 15:57         Assessment/Plan 1. CKD stage 4 due to type 2 diabetes mellitus ([place]) Recommend:   At this time the patient does not have appropriate extremity access for dialysis   Patient should have a left brachiocephalic AV fistula created.   The risks, benefits and alternative therapies were reviewed in detail with the patient.  All questions were answered.  The patient agrees to proceed with surgery.    The patient will follow up with me in the office after the surgery.    2. Lymphedema Lower extremity edema well-controlled today.  Advised to continue with conservative therapy including use of medical grade compression, elevation and activity   3. Essential hypertension Continue antihypertensive medications as already ordered, these medications have been reviewed and there are no changes at this time.    4. Mixed hyperlipidemia Continue statin as ordered and reviewed, no changes at this time          Hortencia Pilar, MD  11/26/2021 7:25 AM

## 2021-11-26 NOTE — Op Note (Deleted)
MRN : [id number]  Emily [name] is a 61 y.o. (02-21-1960) female who presents with chief complaint of check access.  History of Present Illness:   The patient has chronic renal insufficiency stage 5 secondary to diabetes. The patient's most recent creatinine clearance is less than 15. The patient volume status has not yet become an issue. Patient's blood pressures are elevated. There are mild uremic symptoms which appear to be relatively well tolerated at this time.   The patient notes the kidney problem has been present for a long time and has been progressively getting worse.   The patient is followed by nephrology.     The patient is right-handed.   The patient notes that she is not scheduled for dialysis at this point time however due to the successively worsening renal function she was referred by her nephrologist for evaluation in preparation for fistula so that it may mature prior to need.   No recent shortening of the patient's walking distance or new symptoms consistent with claudication.  No history of rest pain symptoms. No new ulcers or wounds of the lower extremities have occurred.   The patient denies amaurosis fugax or recent TIA symptoms. There are no recent neurological changes noted. There is no history of DVT, PE or superficial thrombophlebitis. No recent episodes of angina or shortness of breath documented.    Current Meds  Medication Sig   albuterol (PROVENTIL HFA;VENTOLIN HFA) 108 (90 Base) MCG/ACT inhaler Inhale 1-2 puffs into the lungs every 6 (six) hours as needed for wheezing or shortness of breath.   Armodafinil (NUVIGIL) 50 MG tablet Take 1 tablet by mouth every morning.   aspirin EC 81 MG tablet Take 81 mg by mouth daily.   atorvastatin (LIPITOR) 40 MG tablet TAKE ONE TABLET BY MOUTH EVERY DAY (Patient taking differently: Take 40 mg by mouth at bedtime.)   azithromycin (ZITHROMAX) 250 MG tablet Take 1 tablet (250 mg total) by  mouth daily at 6 PM for 3 days.   calcitRIOL (ROCALTROL) 0.25 MCG capsule Take 1 capsule (0.25 mcg total) by mouth 3 (three) times a week.   carvedilol (COREG) 25 MG tablet Take 1 tablet by mouth twice daily (Patient taking differently: Take 25 mg by mouth 2 (two) times daily with a meal.)   esomeprazole (NEXIUM) 40 MG capsule Take 40 mg by mouth every morning.   fluticasone (FLONASE) 50 MCG/ACT nasal spray Place 1 spray into both nostrils daily.   hydrALAZINE (APRESOLINE) 25 MG tablet Take 1 tablet (25 mg total) by mouth every 8 (eight) hours.   insulin glargine (LANTUS SOLOSTAR) 100 UNIT/ML Solostar Pen Inject 5 Units into the skin at bedtime. INJECT 30 UNITS SUBCUTANEOUSLY IN THE MORNING AND 30 UNITS AT NIGHT (Patient taking differently: Inject 38 Units into the skin at bedtime.)   NOVOLOG FLEXPEN 100 UNIT/ML FlexPen Check sugars first if above 170 can give 3 units with meals (Patient taking differently: 3 Units 3 (three) times daily with meals. Check sugars first if above 170 can give 3 units with meals)   OXYGEN Inhale 4 L into the lungs continuous.   predniSONE (DELTASONE) 20 MG tablet Take 2 tablets (40 mg total) by mouth daily with breakfast for 5 days.   tiotropium (SPIRIVA) 18 MCG inhalation capsule Place 18 mcg into inhaler and inhale daily.   torsemide (DEMADEX) 20 MG tablet Take  1 tablet (20 mg total) by mouth every morning.    Past Medical History:  Diagnosis Date   Anemia    Arthritis    hands, all over   Asthma    CHF (congestive heart failure) (HCC)    Chronic respiratory failure with hypoxia (HCC)    Chronic venous insufficiency    COPD (chronic obstructive pulmonary disease) (HCC)    on 2L home o2   Diabetes mellitus without complication (HCC)    type 2   Dyspnea    ESRD (end stage renal disease) (HCC)    History of methicillin resistant staphylococcus aureus (MRSA) 2015   Hyperlipidemia    Hypertension    Hyperthyroidism    Lymphedema    OSA on CPAP    Oxygen  dependent    4 L [place]   Pneumonia 10/17/2021   Puerperal sepsis with acute hypoxic respiratory failure ([place])    Stroke [facility])    Was told at recent hospital visit she has stroke in past   Thyroid disease    Thyroid nodule 04/30/2016    Past Surgical History:  Procedure Laterality Date   arm surgery Left    COLONOSCOPY WITH PROPOFOL N/A 02/14/2019   Procedure: COLONOSCOPY WITH PROPOFOL;  Surgeon: [name], MD;  Location: [facility] ENDOSCOPY;  Service: Endoscopy;  Laterality: N/A;    Social History Social History   Tobacco Use   Smoking status: Former    Packs/day: 1.50    Years: 40.00    Total pack years: 60.00    Types: Cigarettes    Quit date: 01/13/2016    Years since quitting: 5.8   Smokeless tobacco: Never  Vaping Use   Vaping Use: Never used  Substance Use Topics   Alcohol use: No   Drug use: No    Family History Family History  Problem Relation Age of Onset   Diabetes Mother    Hypertension Mother    Diabetes Father    Hypertension Father    CAD Father    Diabetes Sister     No Known Allergies   REVIEW OF SYSTEMS (Negative unless checked)  Constitutional: '[]'$ Weight loss  '[]'$ Fever  '[]'$ Chills Cardiac: '[]'$ Chest pain   '[]'$ Chest pressure   '[]'$ Palpitations   '[]'$ Shortness of breath when laying flat   '[]'$ Shortness of breath with exertion. Vascular:  '[]'$ Pain in legs with walking   '[]'$ Pain in legs at rest  '[]'$ History of DVT   '[]'$ Phlebitis   '[]'$ Swelling in legs   '[]'$ Varicose veins   '[]'$ Non-healing ulcers Pulmonary:   '[]'$ Uses home oxygen   '[]'$ Productive cough   '[]'$ Hemoptysis   '[]'$ Wheeze  '[]'$ COPD   '[]'$ Asthma Neurologic:  '[]'$ Dizziness   '[]'$ Seizures   '[]'$ History of stroke   '[]'$ History of TIA  '[]'$ Aphasia   '[]'$ Vissual changes   '[]'$ Weakness or numbness in arm   '[]'$ Weakness or numbness in leg Musculoskeletal:   '[]'$ Joint swelling   '[]'$ Joint pain   '[]'$ Low back pain Hematologic:  '[]'$ Easy bruising  '[]'$ Easy bleeding   '[]'$ Hypercoagulable state   '[]'$ Anemic Gastrointestinal:  '[]'$ Diarrhea   '[]'$ Vomiting  '[]'$ Gastroesophageal  reflux/heartburn   '[]'$ Difficulty swallowing. Genitourinary:  '[x]'$ Chronic kidney disease   '[]'$ Difficult urination  '[]'$ Frequent urination   '[]'$ Blood in urine Skin:  '[]'$ Rashes   '[]'$ Ulcers  Psychological:  '[]'$ History of anxiety   '[]'$  History of major depression.  Physical Examination  Vitals:   11/26/21 0644  BP: (!) 190/91  Pulse: 85  Resp: (!) 22  Temp: 98.5 F (36.9 C)  TempSrc: Temporal  SpO2: 98%  Weight:  101.2 kg  Height: '5\' 4"'$  (1.626 m)   Body mass index is 38.28 kg/m. Gen: WD/WN, NAD Head: [place]/AT, No temporalis wasting.  Ear/Nose/Throat: Hearing grossly intact, nares w/o erythema or drainage Eyes: PER, EOMI, sclera nonicteric.  Neck: Supple, no gross masses or lesions.  No JVD.  Pulmonary:  Good air movement, no audible wheezing, no use of accessory muscles.  Cardiac: RRR, precordium non-hyperdynamic. Vascular:   no extremity access Vessel Right Left  Radial Palpable Palpable  Brachial Palpable Palpable  Gastrointestinal: soft, non-distended. No guarding/no peritoneal signs.  Musculoskeletal: M/S 5/5 throughout.  No deformity.  Neurologic: CN 2-12 intact. Pain and light touch intact in extremities.  Symmetrical.  Speech is fluent. Motor exam as listed above. Psychiatric: Judgment intact, Mood & affect appropriate for pt's clinical situation. Dermatologic: No rashes or ulcers noted.  No changes consistent with cellulitis.   CBC Lab Results  Component Value Date   WBC 7.1 11/23/2021   HGB 11.6 (L) 11/26/2021   HCT 34.0 (L) 11/26/2021   MCV 88.2 11/23/2021   PLT 269 11/23/2021    BMET    Component Value Date/Time   NA 141 11/26/2021 0641   NA 142 05/14/2016 1852   NA 140 06/02/2013 1230   K 3.3 (L) 11/26/2021 0641   K 3.5 06/02/2013 1230   CL 113 (H) 11/26/2021 0641   CL 107 06/02/2013 1230   CO2 21 (L) 11/23/2021 0530   CO2 26 06/02/2013 1230   GLUCOSE 238 (H) 11/26/2021 0641   GLUCOSE 178 (H) 06/02/2013 1230   BUN 66 (H) 11/26/2021 0641   BUN 27 (H) 05/14/2016  1852   BUN 14 06/02/2013 1230   CREATININE 5.40 (H) 11/26/2021 0641   CREATININE 1.87 (H) 04/05/2018 1008   CALCIUM 8.6 (L) 11/23/2021 0530   CALCIUM 9.5 06/02/2013 1230   GFRNONAA 10 (L) 11/23/2021 0530   GFRNONAA 29 (L) 04/05/2018 1008   GFRAA 22 (L) 04/25/2019 1138   GFRAA 34 (L) 04/05/2018 1008   Estimated Creatinine Clearance: 12.3 mL/min (A) (by C-G formula based on SCr of 5.4 mg/dL (H)).  COAG Lab Results  Component Value Date   INR 0.94 08/13/2016    Radiology DG Chest 2 View  Result Date: 11/22/2021 CLINICAL DATA:  Shortness of breath EXAM: CHEST - 2 VIEW COMPARISON:  October 17, 2021 FINDINGS: The heart size and mediastinal contours are within normal limits. Both lungs are clear. The visualized skeletal structures are unremarkable. IMPRESSION: No active cardiopulmonary disease. Electronically Signed   By: Dorise Bullion III M.D.   On: 11/22/2021 16:46   US Venous Img Lower Bilateral (DVT)  Result Date: 10/27/2021 CLINICAL DATA:  61 year old female with elevated D-dimer and lower extremity edema. EXAM: BILATERAL LOWER EXTREMITY VENOUS DOPPLER ULTRASOUND TECHNIQUE: Gray-scale sonography with graded compression, as well as color Doppler and duplex ultrasound were performed to evaluate the lower extremity deep venous systems from the level of the common femoral vein and including the common femoral, femoral, profunda femoral, popliteal and calf veins including the posterior tibial, peroneal and gastrocnemius veins when visible. The superficial great saphenous vein was also interrogated. Spectral Doppler was utilized to evaluate flow at rest and with distal augmentation maneuvers in the common femoral, femoral and popliteal veins. COMPARISON:  None Available. FINDINGS: RIGHT LOWER EXTREMITY Common Femoral Vein: No evidence of thrombus. Normal compressibility, respiratory phasicity and response to augmentation. Saphenofemoral Junction: No evidence of thrombus. Normal compressibility  and flow on color Doppler imaging. Profunda Femoral Vein: No evidence of  thrombus. Normal compressibility and flow on color Doppler imaging. Femoral Vein: No evidence of thrombus. Normal compressibility, respiratory phasicity and response to augmentation. Popliteal Vein: No evidence of thrombus. Normal compressibility, respiratory phasicity and response to augmentation. Calf Veins: No evidence of thrombus. Normal compressibility and flow on color Doppler imaging. Superficial Great Saphenous Vein: No evidence of thrombus. Normal compressibility. Venous Reflux:  None. Other Findings:  None. LEFT LOWER EXTREMITY Common Femoral Vein: No evidence of thrombus. Normal compressibility, respiratory phasicity and response to augmentation. Saphenofemoral Junction: No evidence of thrombus. Normal compressibility and flow on color Doppler imaging. Profunda Femoral Vein: No evidence of thrombus. Normal compressibility and flow on color Doppler imaging. Femoral Vein: No evidence of thrombus. Normal compressibility, respiratory phasicity and response to augmentation. Popliteal Vein: No evidence of thrombus. Normal compressibility, respiratory phasicity and response to augmentation. Calf Veins: No evidence of thrombus. Normal compressibility and flow on color Doppler imaging. Superficial Great Saphenous Vein: No evidence of thrombus. Normal compressibility. Venous Reflux:  None. Other Findings:  None. IMPRESSION: No evidence of deep venous thrombosis in either lower extremity. Electronically Signed   By: Jacqulynn Cadet M.D.   On: 10/27/2021 15:57     Assessment/Plan 1. CKD stage 4 due to type 2 diabetes mellitus ([place]) Recommend:   At this time the patient does not have appropriate extremity access for dialysis   Patient should have a left brachiocephalic AV fistula created.   The risks, benefits and alternative therapies were reviewed in detail with the patient.  All questions were answered.  The patient agrees to  proceed with surgery.    The patient will follow up with me in the office after the surgery.    2. Lymphedema Lower extremity edema well-controlled today.  Advised to continue with conservative therapy including use of medical grade compression, elevation and activity   3. Essential hypertension Continue antihypertensive medications as already ordered, these medications have been reviewed and there are no changes at this time.    4. Mixed hyperlipidemia Continue statin as ordered and reviewed, no changes at this time       Hortencia Pilar, MD  11/26/2021 7:25 AM

## 2021-11-26 NOTE — Interval H&P Note (Signed)
History and Physical Interval Note:  11/26/2021 9:46 AM  Emily [name]  has presented today for surgery, with the diagnosis of CHRONIC KIDNEY DISEASE STAGE 5.  The various methods of treatment have been discussed with the patient and family. After consideration of risks, benefits and other options for treatment, the patient has consented to  Procedure(s): ARTERIOVENOUS (AV) FISTULA CREATION ( BRACHIALCEPHALIC) (Left) as a surgical intervention.  The patient's history has been reviewed, patient examined, no change in status, stable for surgery.  I have reviewed the patient's chart and labs.  Questions were answered to the patient's satisfaction.     Hortencia Pilar

## 2021-11-26 NOTE — Discharge Instructions (Signed)
AMBULATORY SURGERY  ?DISCHARGE INSTRUCTIONS ? ? ?The drugs that you were given will stay in your system until tomorrow so for the next 24 hours you should not: ? ?Drive an automobile ?Make any legal decisions ?Drink any alcoholic beverage ? ? ?You may resume regular meals tomorrow.  Today it is better to start with liquids and gradually work up to solid foods. ? ?You may eat anything you prefer, but it is better to start with liquids, then soup and crackers, and gradually work up to solid foods. ? ? ?Please notify your doctor immediately if you have any unusual bleeding, trouble breathing, redness and pain at the surgery site, drainage, fever, or pain not relieved by medication. ? ? ? ?Additional Instructions: ? ? ? ?Please contact your physician with any problems or Same Day Surgery at [phone], Monday through Friday 6 am to 4 pm, or [facility] [place] [facility] number at [phone].  ?

## 2021-12-05 DEATH — deceased

## 2021-12-24 ENCOUNTER — Ambulatory Visit (INDEPENDENT_AMBULATORY_CARE_PROVIDER_SITE_OTHER): Payer: Medicaid Other | Admitting: Nurse Practitioner

## 2021-12-24 ENCOUNTER — Encounter (INDEPENDENT_AMBULATORY_CARE_PROVIDER_SITE_OTHER): Payer: Medicaid Other

## 2022-01-29 ENCOUNTER — Ambulatory Visit: Payer: Self-pay | Admitting: Podiatry

## 2022-01-30 ENCOUNTER — Ambulatory Visit: Payer: Medicaid Other | Admitting: Internal Medicine

## 2022-03-17 ENCOUNTER — Ambulatory Visit: Payer: Medicaid Other | Admitting: Family

## 2023-03-14 IMAGING — CR DG KNEE COMPLETE 4+V*L*
1 series · 4 of 4 positions shown · non-contrast
Comparison: None.

CLINICAL DATA: Chronic kidney disease.  Left knee pain.  No injury.

EXAM:
LEFT KNEE - COMPLETE 4+ VIEW

[Series 1: w knee ap left · 0.14mm/px · 4 of 4 slices shown]
[im 1/4]
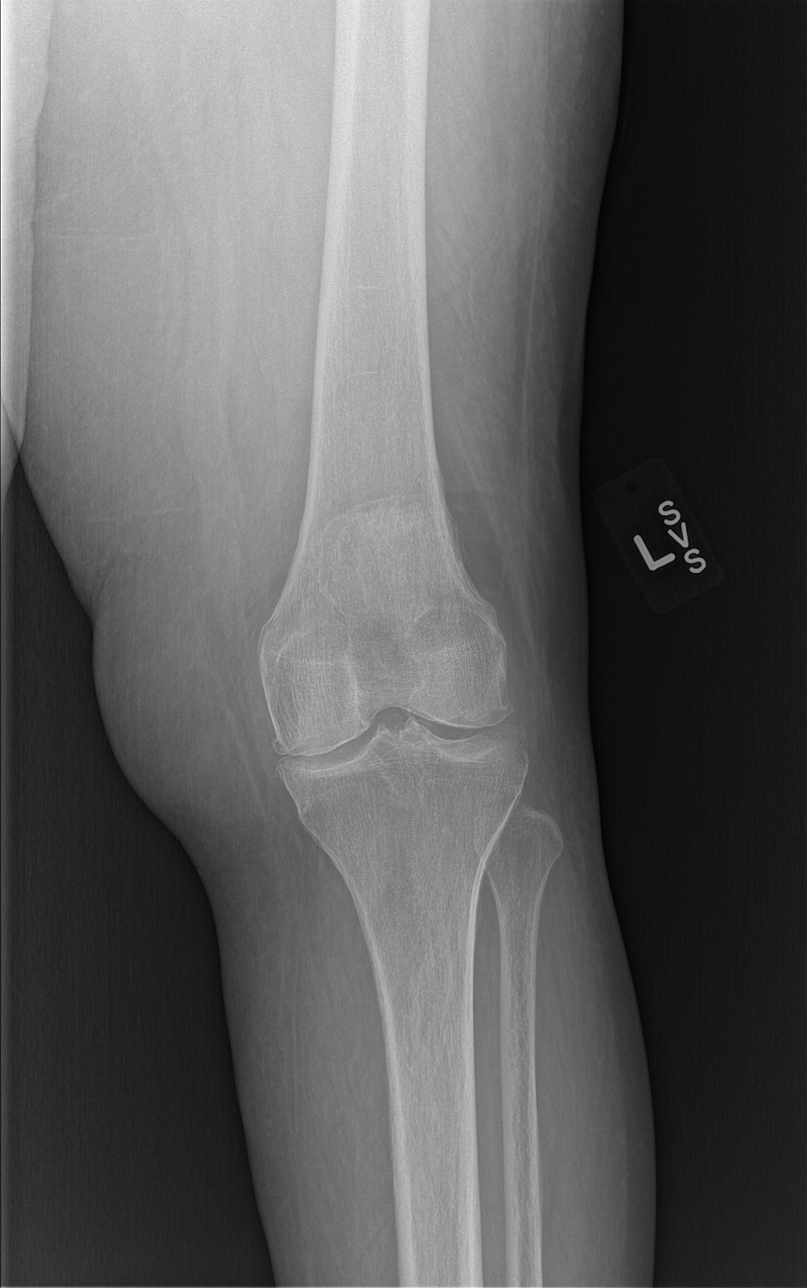
[im 2/4]
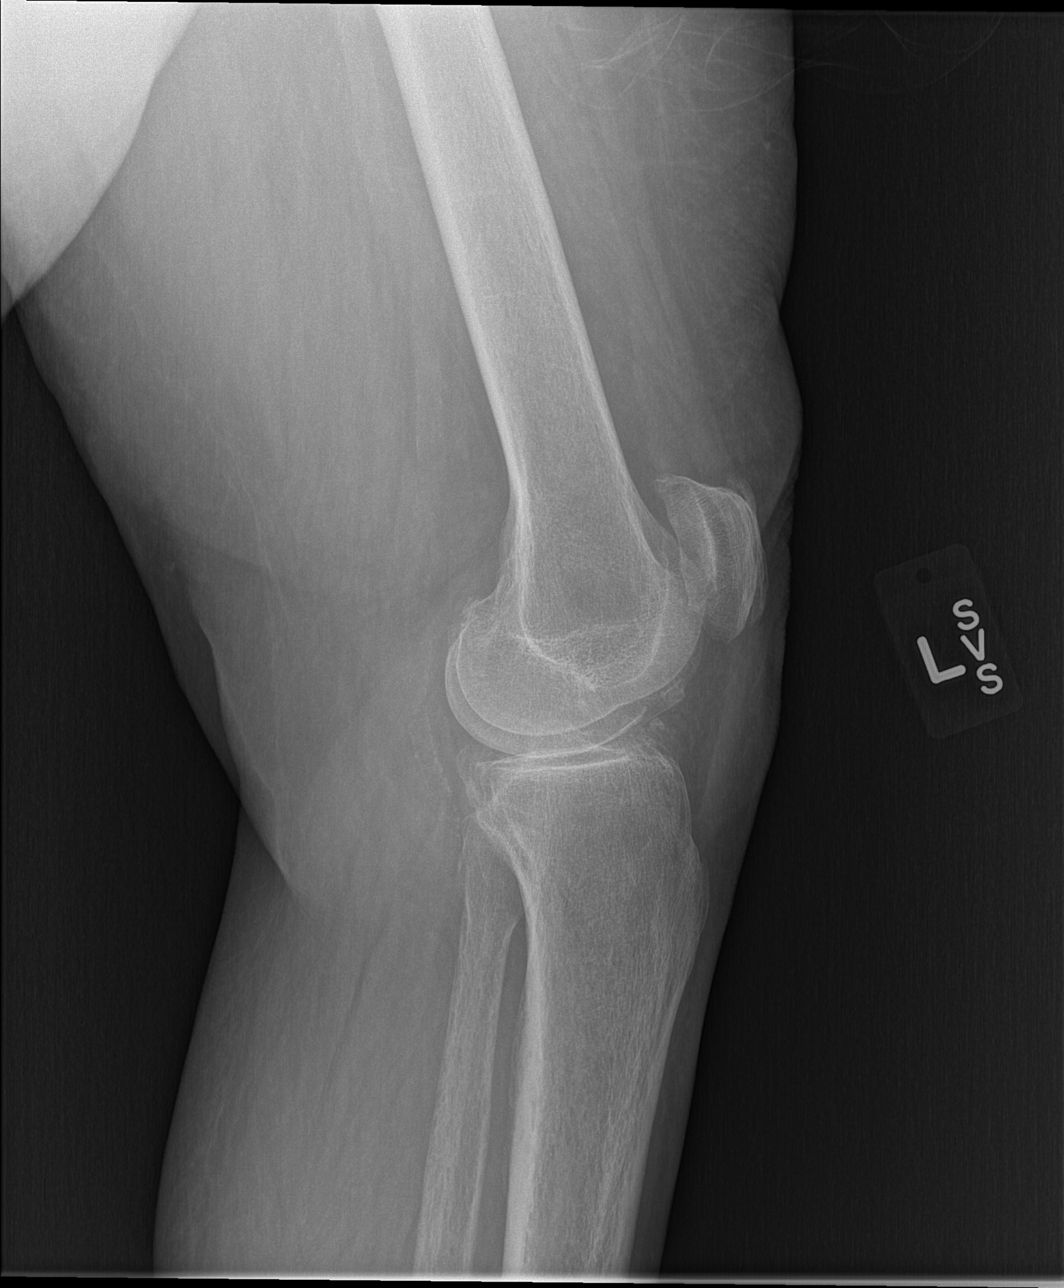
[im 3/4]
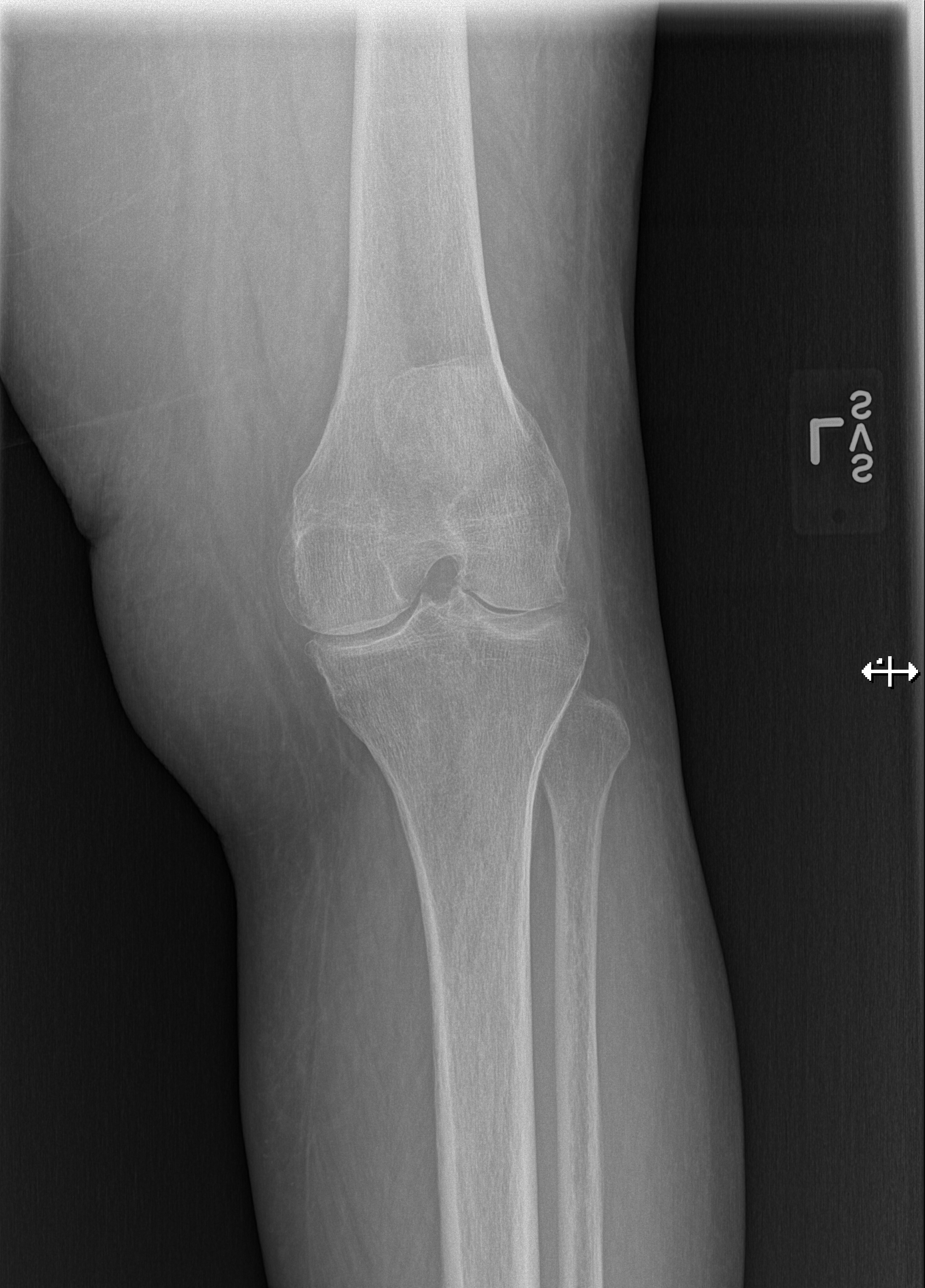
[im 4/4]
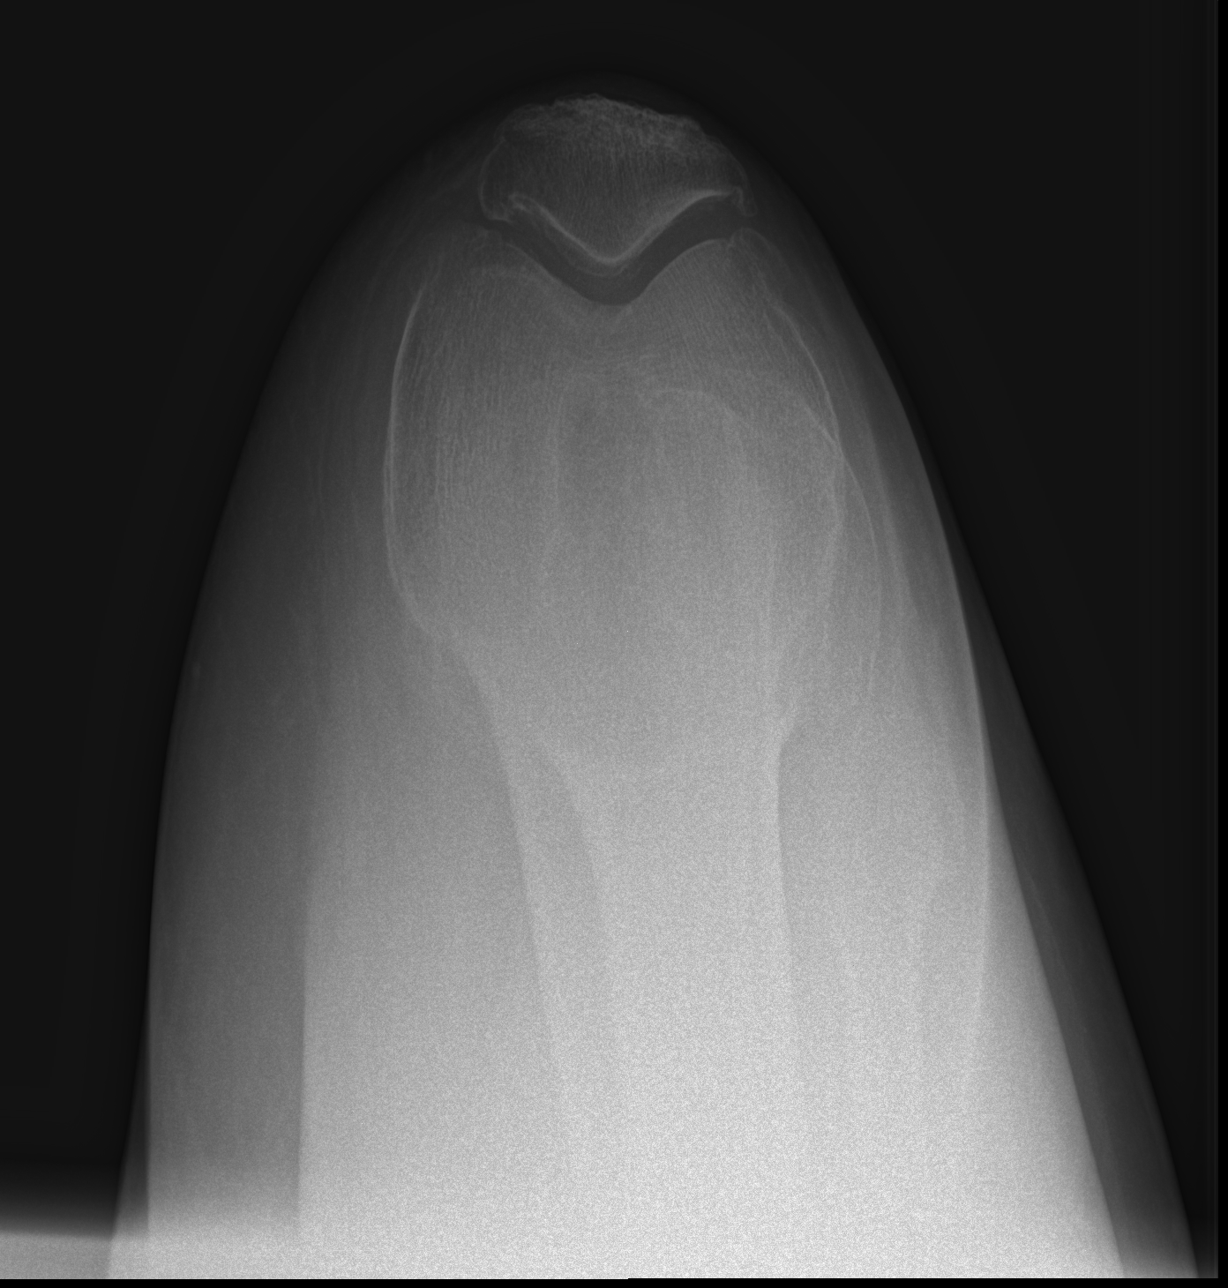

[4 of 4 positions shown; findings below may reference images not displayed]

FINDINGS: Enthesopathic changes associated with the anterior patella both
superiorly and inferiorly. Tricompartmental degenerative changes
with small osteophytes. No fractures, effusions, or dislocations.
Vascular calcifications are noted.
IMPRESSION: Degenerative and enthesopathic changes as above.

## 2024-04-11 IMAGING — CT CT CHEST W/O CM
2 of 4 series · 15 of 36 positions shown, 18 images · non-contrast
Comparison: 02/15/2018

CLINICAL DATA: Altered mental status, COPD



[Series 3: chest wo · axial · 0.63mm/px · z∈[+1282,+1530]mm · 12 of 148 slices shown, 15 images]
[im 12/148  mediastinal]
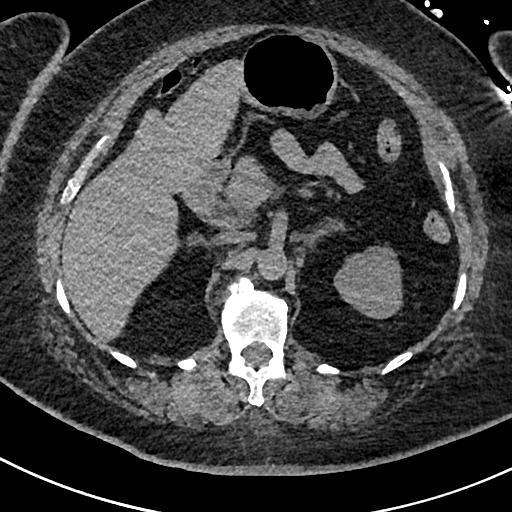
[im 12/148  lung]
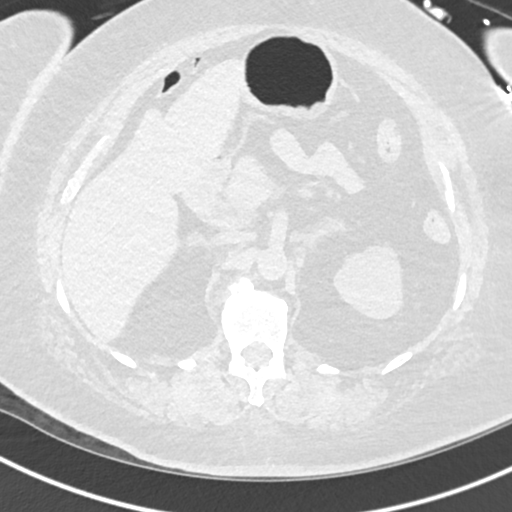
[im 23/148  lung]
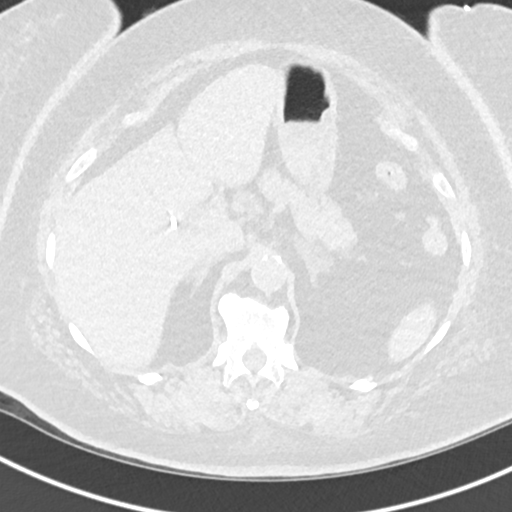
[im 34/148  lung]
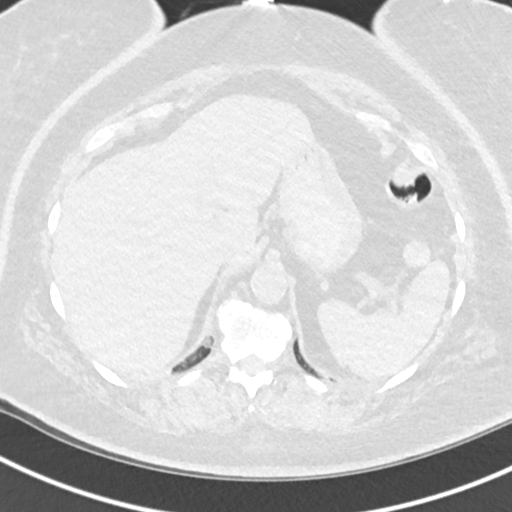
[im 46/148  lung]
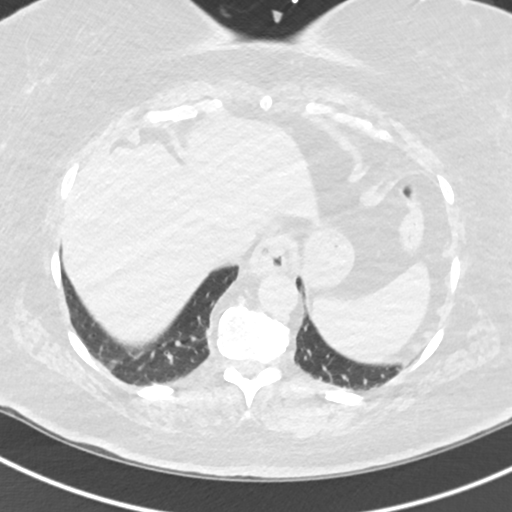
[im 57/148  mediastinal]
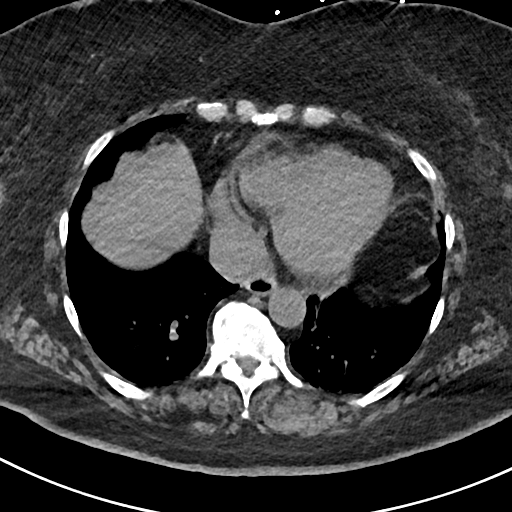
[im 57/148  lung]
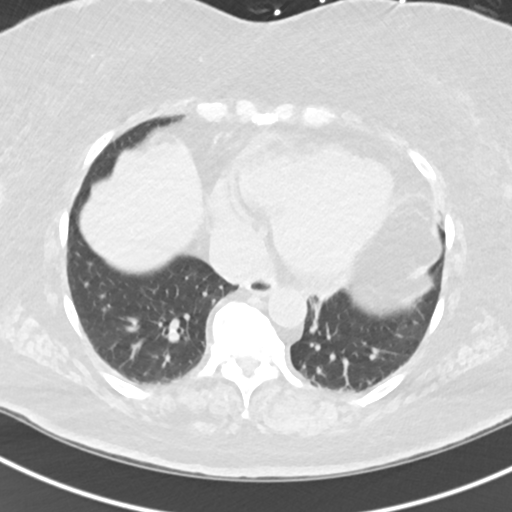
[im 68/148  lung]
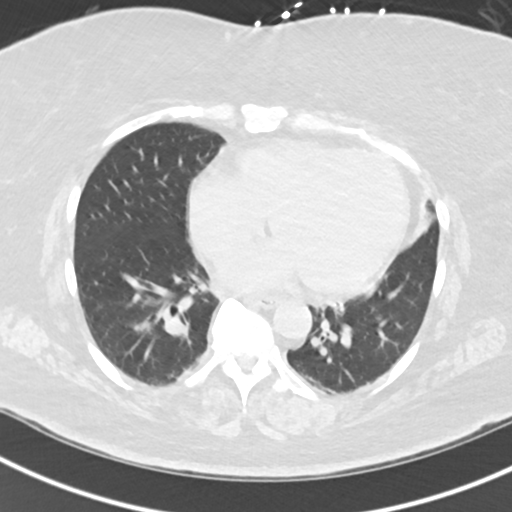
[im 80/148  lung]
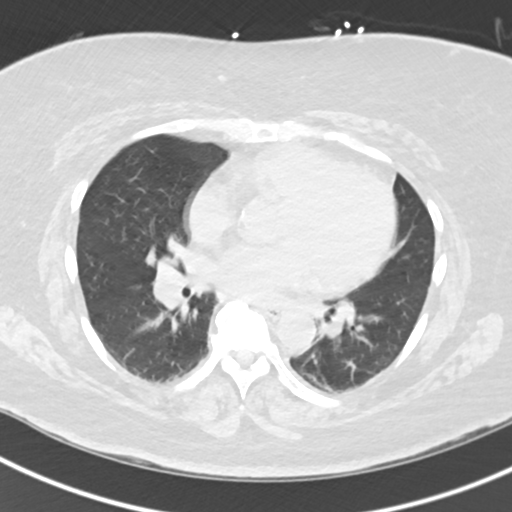
[im 91/148  lung]
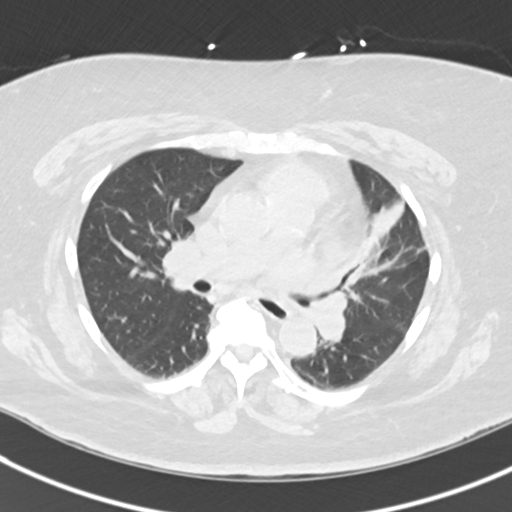
[im 102/148  mediastinal]
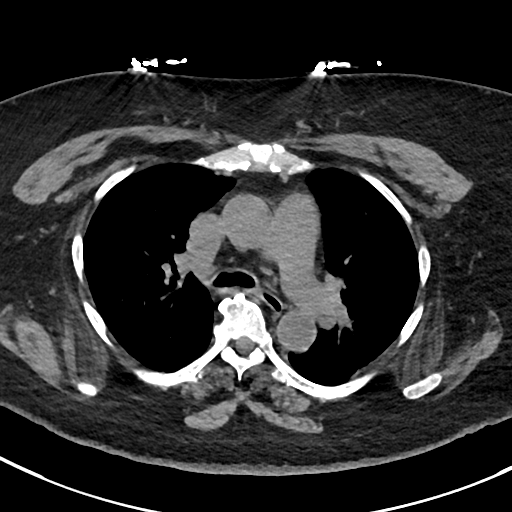
[im 102/148  lung]
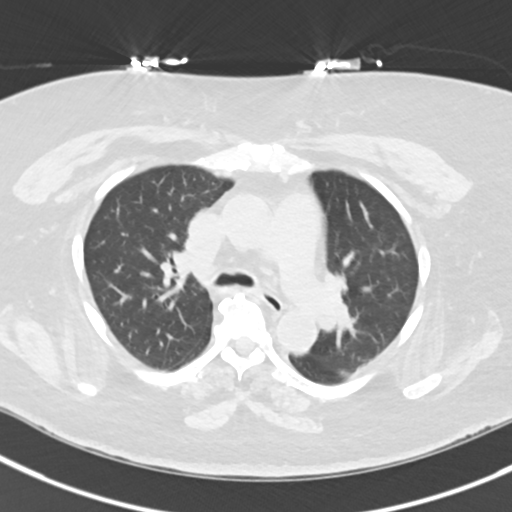
[im 114/148  lung]
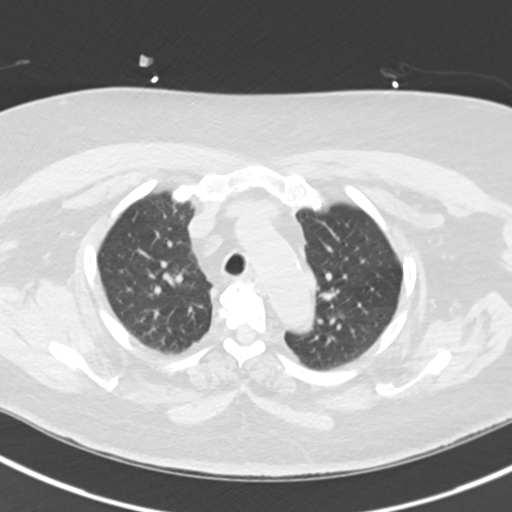
[im 125/148  lung]
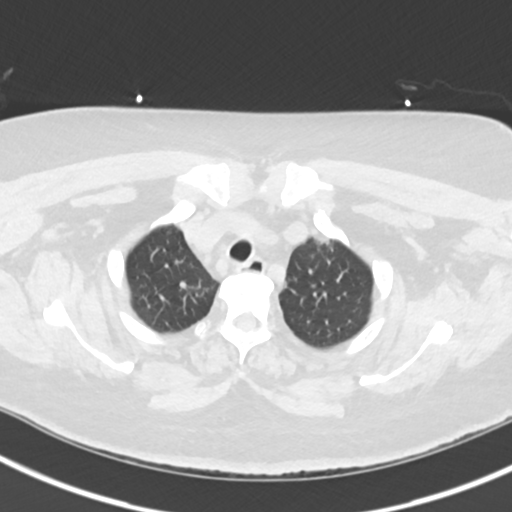
[im 136/148  lung]
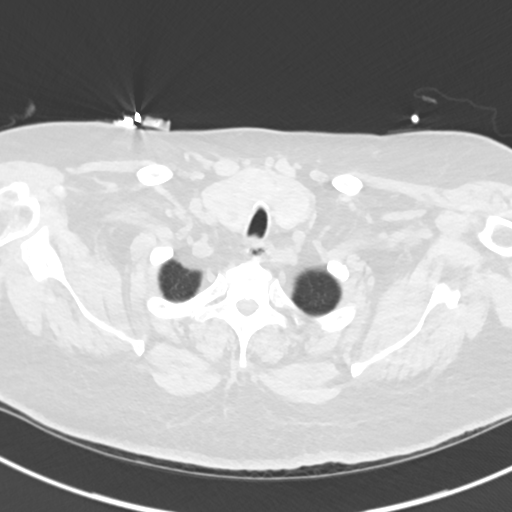

[Series 6: cor · coronal · 0.61mm/px · 3 of 130 slices shown]
[im 26/130  lung]
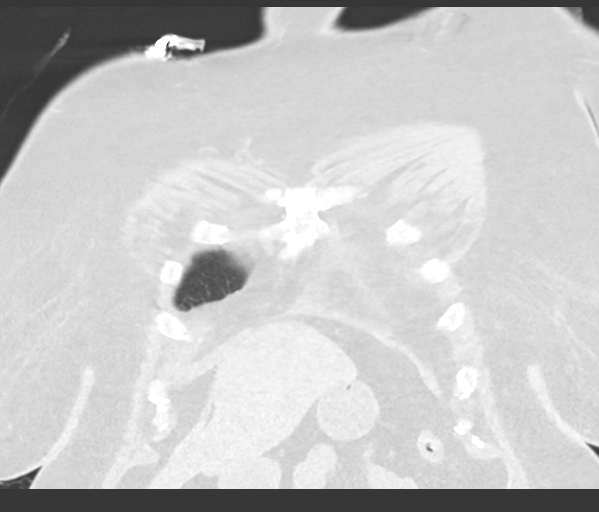
[im 52/130  lung]
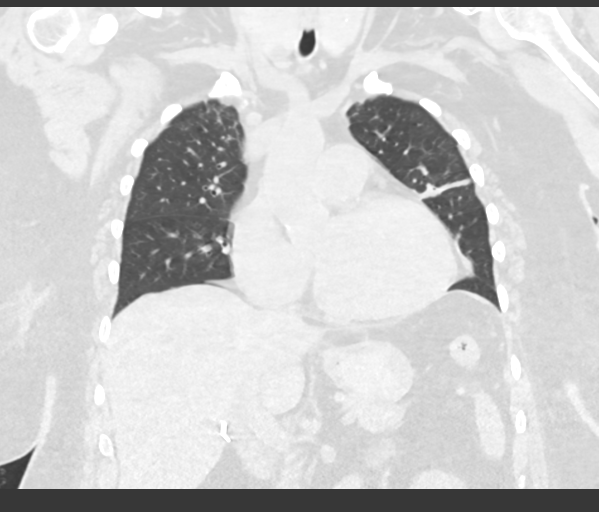
[im 78/130  lung]
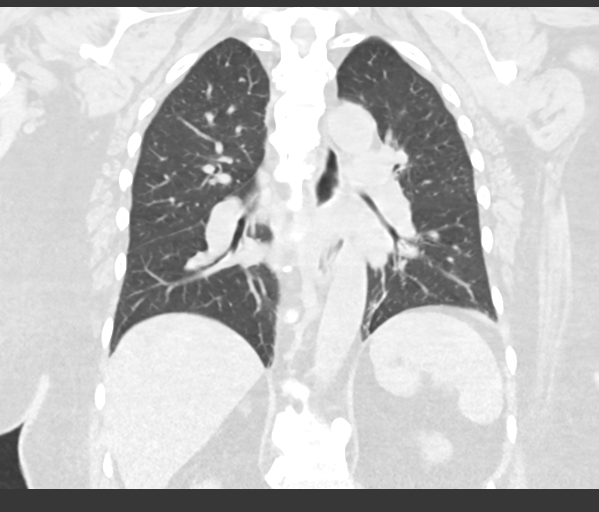

[15 of 36 positions shown; findings below may reference images not displayed]

FINDINGS: Cardiovascular: Heart size upper limits normal. Trace pericardial
fluid. Mild coronary calcifications. Scattered aortic
calcifications.

Mediastinum/Nodes: Enlarged heterogenous thyroid, which has been
recently evaluated with ultrasound. No mediastinal hematoma. 1 cm
pre-vascular lymph node (Im42,Se3) , previously 9 mm. 11 mm high
right paratracheal node previously 9 mm image 37. No hilar
adenopathy, sensitivity decreased in the absence of IV contrast.

Lungs/Pleura: No pleural effusion. New linear subsegmental
atelectasis in anterior left upper lobe. Interval resolution of
previously noted right lower lobe airspace disease.

Upper Abdomen: Cholecystectomy clips.  No acute findings.

Musculoskeletal: DISH across multiple levels in the mid and lower
thoracic spine.
IMPRESSION: 1. Anterior left upper lobe linear atelectasis or scarring.
2. Borderline mediastinal lymph node enlargement, possibly reactive
but nonspecific.
3. Coronary and Aortic Atherosclerosis ([id number]).
